# Patient Record
Sex: Male | Born: 1940
Health system: Southern US, Community
[De-identification: ages and names within clinical notes are randomized; demographics above are authoritative.]

## PROBLEM LIST (undated history)

## (undated) DIAGNOSIS — I350 Nonrheumatic aortic (valve) stenosis: Secondary | ICD-10-CM

## (undated) DIAGNOSIS — I2510 Atherosclerotic heart disease of native coronary artery without angina pectoris: Secondary | ICD-10-CM

## (undated) DIAGNOSIS — D649 Anemia, unspecified: Secondary | ICD-10-CM

## (undated) DIAGNOSIS — I440 Atrioventricular block, first degree: Secondary | ICD-10-CM

## (undated) DIAGNOSIS — I10 Essential (primary) hypertension: Secondary | ICD-10-CM

## (undated) DIAGNOSIS — N400 Enlarged prostate without lower urinary tract symptoms: Secondary | ICD-10-CM

## (undated) DIAGNOSIS — I4891 Unspecified atrial fibrillation: Secondary | ICD-10-CM

## (undated) DIAGNOSIS — C61 Malignant neoplasm of prostate: Secondary | ICD-10-CM

## (undated) DIAGNOSIS — E119 Type 2 diabetes mellitus without complications: Secondary | ICD-10-CM

## (undated) DIAGNOSIS — J45909 Unspecified asthma, uncomplicated: Secondary | ICD-10-CM

## (undated) DIAGNOSIS — E785 Hyperlipidemia, unspecified: Secondary | ICD-10-CM

## (undated) DIAGNOSIS — Z951 Presence of aortocoronary bypass graft: Secondary | ICD-10-CM

## (undated) DIAGNOSIS — I483 Typical atrial flutter: Secondary | ICD-10-CM

## (undated) DIAGNOSIS — M4800 Spinal stenosis, site unspecified: Secondary | ICD-10-CM

## (undated) DIAGNOSIS — M1990 Unspecified osteoarthritis, unspecified site: Secondary | ICD-10-CM

## (undated) DIAGNOSIS — K5790 Diverticulosis of intestine, part unspecified, without perforation or abscess without bleeding: Secondary | ICD-10-CM

## (undated) HISTORY — PX: COLONOSCOPY: SHX5424

## (undated) HISTORY — DX: Unspecified osteoarthritis, unspecified site: M19.90

## (undated) HISTORY — PX: BACK SURGERY: SHX140

## (undated) HISTORY — DX: Benign prostatic hyperplasia without lower urinary tract symptoms: N40.0

## (undated) HISTORY — DX: Typical atrial flutter: I48.3

## (undated) HISTORY — DX: Spinal stenosis, site unspecified: M48.00

## (undated) HISTORY — DX: Diverticulosis of intestine, part unspecified, without perforation or abscess without bleeding: K57.90

## (undated) HISTORY — PX: APPENDECTOMY: SHX54

## (undated) HISTORY — DX: Atrioventricular block, first degree: I44.0

## (undated) HISTORY — PX: JOINT REPLACEMENT: SHX530

## (undated) HISTORY — DX: Hyperlipidemia, unspecified: E78.5

## (undated) HISTORY — DX: Atherosclerotic heart disease of native coronary artery without angina pectoris: I25.10

## (undated) HISTORY — DX: Anemia, unspecified: D64.9

## (undated) HISTORY — DX: Unspecified asthma, uncomplicated: J45.909

---

## 1993-10-21 HISTORY — PX: CORONARY ARTERY BYPASS GRAFT: SHX141

## 1999-01-07 ENCOUNTER — Encounter (INDEPENDENT_AMBULATORY_CARE_PROVIDER_SITE_OTHER): Payer: Self-pay | Admitting: *Deleted

## 1999-01-07 ENCOUNTER — Ambulatory Visit (HOSPITAL_BASED_OUTPATIENT_CLINIC_OR_DEPARTMENT_OTHER): Admission: RE | Admit: 1999-01-07 | Discharge: 1999-01-07 | Payer: Self-pay | Admitting: *Deleted

## 1999-01-31 HISTORY — PX: SALIVARY STONE REMOVAL: SHX5213

## 2001-12-19 ENCOUNTER — Encounter: Admission: RE | Admit: 2001-12-19 | Discharge: 2001-12-19 | Payer: Self-pay | Admitting: Internal Medicine

## 2001-12-19 ENCOUNTER — Encounter: Payer: Self-pay | Admitting: Internal Medicine

## 2002-01-04 ENCOUNTER — Encounter: Admission: RE | Admit: 2002-01-04 | Discharge: 2002-01-04 | Payer: Self-pay | Admitting: Internal Medicine

## 2002-01-04 ENCOUNTER — Encounter: Payer: Self-pay | Admitting: Internal Medicine

## 2002-01-30 DIAGNOSIS — M4800 Spinal stenosis, site unspecified: Secondary | ICD-10-CM

## 2002-01-30 HISTORY — DX: Spinal stenosis, site unspecified: M48.00

## 2002-01-30 HISTORY — PX: MICRODISCECTOMY LUMBAR: SUR864

## 2002-07-31 ENCOUNTER — Encounter: Payer: Self-pay | Admitting: Diagnostic Radiology

## 2002-07-31 ENCOUNTER — Encounter: Admission: RE | Admit: 2002-07-31 | Discharge: 2002-07-31 | Payer: Self-pay | Admitting: Neurosurgery

## 2002-11-26 ENCOUNTER — Inpatient Hospital Stay (HOSPITAL_COMMUNITY): Admission: RE | Admit: 2002-11-26 | Discharge: 2002-11-27 | Payer: Self-pay | Admitting: Neurosurgery

## 2003-01-31 HISTORY — PX: FLEXIBLE SIGMOIDOSCOPY: SHX1649

## 2004-07-26 ENCOUNTER — Ambulatory Visit: Payer: Self-pay | Admitting: Internal Medicine

## 2005-02-02 ENCOUNTER — Ambulatory Visit: Payer: Self-pay | Admitting: Family Medicine

## 2005-04-18 ENCOUNTER — Ambulatory Visit: Payer: Self-pay | Admitting: Internal Medicine

## 2005-07-18 ENCOUNTER — Ambulatory Visit: Payer: Self-pay | Admitting: Internal Medicine

## 2005-08-11 ENCOUNTER — Ambulatory Visit: Payer: Self-pay | Admitting: Internal Medicine

## 2005-12-05 ENCOUNTER — Ambulatory Visit: Payer: Self-pay | Admitting: Internal Medicine

## 2005-12-05 LAB — CONVERTED CEMR LAB
Hgb A1c MFr Bld: 5.6 % (ref 4.6–6.0)
LDL Cholesterol: 51 mg/dL (ref 0–99)
VLDL: 20 mg/dL (ref 0–40)

## 2006-08-06 ENCOUNTER — Ambulatory Visit: Payer: Self-pay | Admitting: Internal Medicine

## 2006-08-06 DIAGNOSIS — E782 Mixed hyperlipidemia: Secondary | ICD-10-CM | POA: Insufficient documentation

## 2006-08-06 DIAGNOSIS — I2510 Atherosclerotic heart disease of native coronary artery without angina pectoris: Secondary | ICD-10-CM | POA: Insufficient documentation

## 2006-08-06 DIAGNOSIS — R7989 Other specified abnormal findings of blood chemistry: Secondary | ICD-10-CM | POA: Insufficient documentation

## 2006-08-12 ENCOUNTER — Encounter: Payer: Self-pay | Admitting: Internal Medicine

## 2006-08-13 ENCOUNTER — Encounter (INDEPENDENT_AMBULATORY_CARE_PROVIDER_SITE_OTHER): Payer: Self-pay | Admitting: *Deleted

## 2006-08-28 ENCOUNTER — Encounter: Payer: Self-pay | Admitting: Internal Medicine

## 2006-09-06 ENCOUNTER — Encounter: Payer: Self-pay | Admitting: Internal Medicine

## 2006-11-27 ENCOUNTER — Encounter: Payer: Self-pay | Admitting: Internal Medicine

## 2007-04-22 ENCOUNTER — Telehealth (INDEPENDENT_AMBULATORY_CARE_PROVIDER_SITE_OTHER): Payer: Self-pay | Admitting: *Deleted

## 2007-04-23 ENCOUNTER — Ambulatory Visit: Payer: Self-pay | Admitting: Internal Medicine

## 2007-04-26 ENCOUNTER — Telehealth (INDEPENDENT_AMBULATORY_CARE_PROVIDER_SITE_OTHER): Payer: Self-pay | Admitting: *Deleted

## 2007-04-29 ENCOUNTER — Telehealth (INDEPENDENT_AMBULATORY_CARE_PROVIDER_SITE_OTHER): Payer: Self-pay | Admitting: *Deleted

## 2007-05-14 ENCOUNTER — Telehealth (INDEPENDENT_AMBULATORY_CARE_PROVIDER_SITE_OTHER): Payer: Self-pay | Admitting: *Deleted

## 2007-05-14 ENCOUNTER — Encounter: Payer: Self-pay | Admitting: Internal Medicine

## 2007-11-11 ENCOUNTER — Ambulatory Visit: Payer: Self-pay | Admitting: Internal Medicine

## 2007-11-13 ENCOUNTER — Encounter: Payer: Self-pay | Admitting: Internal Medicine

## 2007-11-14 ENCOUNTER — Encounter (INDEPENDENT_AMBULATORY_CARE_PROVIDER_SITE_OTHER): Payer: Self-pay | Admitting: *Deleted

## 2007-11-14 LAB — CONVERTED CEMR LAB
Albumin: 4.5 g/dL (ref 3.5–5.2)
Alkaline Phosphatase: 57 units/L (ref 39–117)
BUN: 17 mg/dL (ref 6–23)
Basophils Relative: 0.8 % (ref 0.0–3.0)
Calcium: 10.1 mg/dL (ref 8.4–10.5)
Creatinine, Ser: 0.8 mg/dL (ref 0.4–1.5)
Eosinophils Absolute: 0.2 10*3/uL (ref 0.0–0.7)
Eosinophils Relative: 3 % (ref 0.0–5.0)
GFR calc Af Amer: 124 mL/min
GFR calc non Af Amer: 103 mL/min
Glucose, Bld: 123 mg/dL — ABNORMAL HIGH (ref 70–99)
HCT: 48.2 % (ref 39.0–52.0)
Hemoglobin: 16.8 g/dL (ref 13.0–17.0)
Hgb A1c MFr Bld: 6.1 % — ABNORMAL HIGH (ref 4.6–6.0)
MCV: 92.7 fL (ref 78.0–100.0)
Monocytes Absolute: 0.5 10*3/uL (ref 0.1–1.0)
Monocytes Relative: 8.9 % (ref 3.0–12.0)
Neutro Abs: 3.5 10*3/uL (ref 1.4–7.7)
Platelets: 294 10*3/uL (ref 150–400)
Potassium: 4.4 meq/L (ref 3.5–5.1)
TSH: 0.86 microintl units/mL (ref 0.35–5.50)
Total CHOL/HDL Ratio: 3
Total Protein: 8 g/dL (ref 6.0–8.3)
Triglycerides: 209 mg/dL (ref 0–149)
WBC: 6 10*3/uL (ref 4.5–10.5)

## 2007-12-17 ENCOUNTER — Ambulatory Visit: Admission: RE | Admit: 2007-12-17 | Discharge: 2007-12-20 | Payer: Self-pay | Admitting: Radiation Oncology

## 2008-02-06 ENCOUNTER — Ambulatory Visit: Admission: RE | Admit: 2008-02-06 | Discharge: 2008-04-28 | Payer: Self-pay | Admitting: Radiation Oncology

## 2008-04-27 ENCOUNTER — Encounter: Admission: RE | Admit: 2008-04-27 | Discharge: 2008-04-27 | Payer: Self-pay | Admitting: Specialist

## 2008-07-06 ENCOUNTER — Telehealth (INDEPENDENT_AMBULATORY_CARE_PROVIDER_SITE_OTHER): Payer: Self-pay | Admitting: *Deleted

## 2008-07-09 ENCOUNTER — Encounter: Payer: Self-pay | Admitting: Internal Medicine

## 2008-10-20 ENCOUNTER — Encounter: Admission: RE | Admit: 2008-10-20 | Discharge: 2008-10-20 | Payer: Self-pay | Admitting: Specialist

## 2008-10-30 HISTORY — PX: PROSTATE BIOPSY: SHX241

## 2008-11-13 ENCOUNTER — Encounter: Payer: Self-pay | Admitting: Internal Medicine

## 2008-11-26 ENCOUNTER — Encounter: Payer: Self-pay | Admitting: Internal Medicine

## 2008-11-30 HISTORY — PX: TOTAL HIP ARTHROPLASTY: SHX124

## 2008-12-02 ENCOUNTER — Telehealth (INDEPENDENT_AMBULATORY_CARE_PROVIDER_SITE_OTHER): Payer: Self-pay | Admitting: *Deleted

## 2008-12-14 ENCOUNTER — Inpatient Hospital Stay (HOSPITAL_COMMUNITY): Admission: RE | Admit: 2008-12-14 | Discharge: 2008-12-16 | Payer: Self-pay | Admitting: Orthopedic Surgery

## 2008-12-18 ENCOUNTER — Telehealth: Payer: Self-pay | Admitting: Internal Medicine

## 2009-03-04 ENCOUNTER — Ambulatory Visit: Payer: Self-pay | Admitting: Internal Medicine

## 2009-03-04 DIAGNOSIS — Z8546 Personal history of malignant neoplasm of prostate: Secondary | ICD-10-CM | POA: Insufficient documentation

## 2009-03-04 DIAGNOSIS — Z8739 Personal history of other diseases of the musculoskeletal system and connective tissue: Secondary | ICD-10-CM | POA: Insufficient documentation

## 2009-03-04 DIAGNOSIS — M109 Gout, unspecified: Secondary | ICD-10-CM | POA: Insufficient documentation

## 2009-03-04 DIAGNOSIS — J309 Allergic rhinitis, unspecified: Secondary | ICD-10-CM | POA: Insufficient documentation

## 2009-03-08 LAB — CONVERTED CEMR LAB
ALT: 23 units/L (ref 0–53)
Alkaline Phosphatase: 78 units/L (ref 39–117)
Bilirubin, Direct: 0 mg/dL (ref 0.0–0.3)
Cholesterol: 178 mg/dL (ref 0–200)
Creatinine, Ser: 0.8 mg/dL (ref 0.4–1.5)
Hgb A1c MFr Bld: 6.8 % — ABNORMAL HIGH (ref 4.6–6.5)
Potassium: 4.2 meq/L (ref 3.5–5.1)
Total Bilirubin: 0.5 mg/dL (ref 0.3–1.2)
Uric Acid, Serum: 5.5 mg/dL (ref 4.0–7.8)
VLDL: 38.4 mg/dL (ref 0.0–40.0)

## 2009-03-11 ENCOUNTER — Telehealth (INDEPENDENT_AMBULATORY_CARE_PROVIDER_SITE_OTHER): Payer: Self-pay | Admitting: *Deleted

## 2009-05-17 ENCOUNTER — Ambulatory Visit: Payer: Self-pay | Admitting: Internal Medicine

## 2009-05-17 DIAGNOSIS — R142 Eructation: Secondary | ICD-10-CM

## 2009-05-17 DIAGNOSIS — R143 Flatulence: Secondary | ICD-10-CM

## 2009-05-17 DIAGNOSIS — R7309 Other abnormal glucose: Secondary | ICD-10-CM

## 2009-05-17 DIAGNOSIS — R141 Gas pain: Secondary | ICD-10-CM | POA: Insufficient documentation

## 2009-05-17 DIAGNOSIS — R079 Chest pain, unspecified: Secondary | ICD-10-CM

## 2009-05-18 LAB — CONVERTED CEMR LAB
Albumin: 3.9 g/dL (ref 3.5–5.2)
Alkaline Phosphatase: 72 units/L (ref 39–117)
Eosinophils Relative: 4.6 % (ref 0.0–5.0)
HCT: 43.7 % (ref 39.0–52.0)
Hemoglobin: 15.1 g/dL (ref 13.0–17.0)
Lymphs Abs: 1.1 10*3/uL (ref 0.7–4.0)
MCV: 90.3 fL (ref 78.0–100.0)
Monocytes Relative: 11.2 % (ref 3.0–12.0)
Neutro Abs: 4.7 10*3/uL (ref 1.4–7.7)
Total Bilirubin: 0.4 mg/dL (ref 0.3–1.2)
WBC: 6.9 10*3/uL (ref 4.5–10.5)

## 2009-07-27 ENCOUNTER — Encounter: Payer: Self-pay | Admitting: Internal Medicine

## 2009-11-30 ENCOUNTER — Encounter: Payer: Self-pay | Admitting: Internal Medicine

## 2010-01-04 ENCOUNTER — Ambulatory Visit: Payer: Self-pay | Admitting: Cardiology

## 2010-01-19 ENCOUNTER — Encounter: Payer: Self-pay | Admitting: Internal Medicine

## 2010-01-19 ENCOUNTER — Ambulatory Visit: Payer: Self-pay | Admitting: Cardiology

## 2010-01-26 ENCOUNTER — Telehealth (INDEPENDENT_AMBULATORY_CARE_PROVIDER_SITE_OTHER): Payer: Self-pay | Admitting: Radiology

## 2010-01-27 ENCOUNTER — Encounter: Payer: Self-pay | Admitting: Cardiovascular Disease

## 2010-01-27 ENCOUNTER — Ambulatory Visit: Payer: Self-pay

## 2010-01-27 ENCOUNTER — Encounter: Payer: Self-pay | Admitting: Cardiology

## 2010-01-27 ENCOUNTER — Encounter (HOSPITAL_COMMUNITY)
Admission: RE | Admit: 2010-01-27 | Discharge: 2010-03-01 | Payer: Self-pay | Source: Home / Self Care | Attending: Cardiology | Admitting: Cardiology

## 2010-01-30 DIAGNOSIS — C61 Malignant neoplasm of prostate: Secondary | ICD-10-CM

## 2010-01-30 HISTORY — DX: Malignant neoplasm of prostate: C61

## 2010-02-07 ENCOUNTER — Ambulatory Visit: Payer: Self-pay | Admitting: Cardiology

## 2010-02-07 ENCOUNTER — Encounter
Admission: RE | Admit: 2010-02-07 | Discharge: 2010-02-07 | Payer: Self-pay | Source: Home / Self Care | Attending: Cardiology | Admitting: Cardiology

## 2010-02-09 ENCOUNTER — Ambulatory Visit: Payer: Self-pay | Admitting: Cardiology

## 2010-02-10 ENCOUNTER — Ambulatory Visit
Admission: RE | Admit: 2010-02-10 | Payer: Self-pay | Source: Home / Self Care | Attending: Cardiology | Admitting: Cardiology

## 2010-02-10 HISTORY — PX: CARDIAC CATHETERIZATION: SHX172

## 2010-02-27 LAB — CONVERTED CEMR LAB
Albumin: 4.4 g/dL (ref 3.5–5.2)
Alkaline Phosphatase: 78 units/L (ref 39–117)
BUN: 16 mg/dL (ref 6–23)
Basophils Absolute: 0.1 10*3/uL (ref 0.0–0.1)
CO2: 32 meq/L (ref 19–32)
Cholesterol: 168 mg/dL (ref 0–200)
GFR calc Af Amer: 109 mL/min
HCT: 49.3 % (ref 39.0–52.0)
HDL: 72.6 mg/dL (ref >39.0)
Hemoglobin: 16.5 g/dL (ref 13.0–17.0)
Hgb A1c MFr Bld: 5.7 % (ref 4.6–6.0)
Lymphocytes Relative: 27.4 % (ref 12.0–46.0)
MCHC: 33.5 g/dL (ref 30.0–36.0)
Monocytes Absolute: 0.6 10*3/uL (ref 0.2–0.7)
Monocytes Relative: 9.7 % (ref 3.0–11.0)
Neutro Abs: 3.5 10*3/uL (ref 1.4–7.7)
Neutrophils Relative %: 57.6 % (ref 43.0–77.0)
PSA: 6.12 ng/mL — ABNORMAL HIGH (ref 0.10–4.00)
Potassium: 4.2 meq/L (ref 3.5–5.1)
Sodium: 143 meq/L (ref 135–145)
Total Bilirubin: 0.8 mg/dL (ref 0.3–1.2)
Total Protein: 7.6 g/dL (ref 6.0–8.3)
Uric Acid, Serum: 5.7 mg/dL (ref 2.4–7.0)

## 2010-03-01 ENCOUNTER — Ambulatory Visit: Payer: Self-pay | Admitting: Cardiology

## 2010-03-02 HISTORY — PX: TOTAL HIP ARTHROPLASTY: SHX124

## 2010-03-03 NOTE — Assessment & Plan Note (Signed)
Summary: YEARLY EXAM AND FASTING LABS///SP[facility]   Vital Signs:  Patient profile:   70 year old male Height:      71 inches Weight:      234 pounds BMI:     32.75 Temp:     97.5 degrees F oral Pulse rate:   88 / minute Resp:     14 per minute BP sitting:   138 / 84  (left arm) Cuff size:   large  Vitals Entered By: Shonna Chock (March 04, 2009 8:24 AM) CC: Yearly follow-up and fasting labs. EKG completed by Dr.Jordan-cardiologist, seen Urologist in 01/2009 Comments REVIEWED MED LIST, PATIENT AGREED DOSE AND INSTRUCTION CORRECT    CC:  Yearly follow-up and fasting labs. EKG completed by Dr.Jordan-cardiologist and seen Urologist in 01/2009.  History of Present Illness: Bryce Maldonado is here for med refills; Dr Swaziland cleared him for R THR in 10/2008. EKG done then but lipids not checked. Both DR Jordan's OV 10/28 & Dr Nilsa Nutting D/C Summary reviewed.He is stable from hip status , able to ambulate. He uses cane to mobilize from chair.  Preventive Screening-Counseling & Management  Alcohol-Tobacco     Smoking Status: quit  Caffeine-Diet-Exercise     Does Patient Exercise: yes  Allergies (verified): No Known Drug Allergies  Past History:  Past Medical History: salivary gland stone LS Spinal Stenosis FASTING HYPERGLYCEMIA (ICD-790.6) HYPERURICEMIA (ICD-790.6) HYPERLIPIDEMIA NEC/NOS (ICD-272.4) C A D (ICD-414.00) Prostate cancer, hx of, 2010, S/P Radiation, [name] Gout, PMH of(last flare 1991)  Past Surgical History: bone spur R foot; salivary stone surgery as OP; LS surgery 2004 for nerve entrapment; Appendectomy Coronary artery bypass graft 1995, 4 vessel, Dr Swaziland, Cardiologist Colonoscopy 2003: neg;2005: hemorrhoids Prostate biopsy 10/2008  Family History: Father:DM,HTN, lung cancer  Mother: CHF @ 65 Siblings: sister lipids; 1/2 sister CAD  Social History: Retired Runner, broadcasting/film/video Married Alcohol use-yes: socailly Regular exercise-yes: walking 35 min once daily & gym  machines 3 X/ week Former Smoker: quit 1977 No diet  Review of Systems General:  Denies chills, fatigue, fever, sweats, and weight loss. Eyes:  Denies blurring, double vision, and vision loss-both eyes. ENT:  Denies difficulty swallowing and hoarseness. CV:  Denies chest pain or discomfort, leg cramps with exertion, palpitations, shortness of breath with exertion, swelling of feet, and swelling of hands. Resp:  Denies cough, shortness of breath, and sputum productive; PMH of asthma as child, last age 59. GI:  Denies abdominal pain, bloody stools, dark tarry stools, and indigestion. GU:  [name] seen 01/2009; PSA 0.9. MS:  Complains of joint pain and stiffness; denies joint redness, joint swelling, low back pain, mid back pain, and thoracic pain. Derm:  Denies lesion(s) and rash. Neuro:  Denies numbness, tingling, and weakness. Psych:  Denies anxiety and depression. Endo:  Complains of heat intolerance; denies cold intolerance, excessive hunger, excessive thirst, and excessive urination. Heme:  Denies abnormal bruising and bleeding. Allergy:  Complains of itching eyes, seasonal allergies, and sneezing.  Physical Exam  General:  well-nourished,in no acute distress; alert,appropriate and cooperative throughout examination Neck:  No deformities, masses, or tenderness noted. Lungs:  Normal respiratory effort, chest expands symmetrically. Lungs are clear to auscultation, no crackles or wheezes. Heart:  Normal rate and regular rhythm. S1 and S2 normal without gallop, murmur, click, rub . S4 with slurring Abdomen:  Bowel sounds positive,abdomen soft and non-tender without masses, organomegaly or hernias noted. Prostate:  [name] Msk:  No deformity or scoliosis noted of thoracic or lumbar spine.  Pulses:  R and L carotid,radial,dorsalis pedis and posterior tibial pulses are full and equal bilaterally Extremities:  No clubbing, cyanosis, edema, or deformity noted with essentially  normal  full range of motion of all joints.   Neurologic:  alert & oriented X3, strength normal in all extremities, and DTRs symmetrical and normal.   Skin:  Intact without suspicious lesions or rashes Cervical Nodes:  No lymphadenopathy noted Axillary Nodes:  No palpable lymphadenopathy Psych:  memory intact for recent and remote, normally interactive, and good eye contact.     Impression & Recommendations:  Problem # 1:  HYPERLIPIDEMIA NEC/NOS (ICD-272.4)  His updated medication list for this problem includes:    Crestor 10 Mg Tabs (Rosuvastatin calcium) .[name]... 1 by mouth qd    Zetia 10 Mg Tabs (Ezetimibe) .[name]... 1 by mouth qd  Orders: Venipuncture (62130) TLB-Lipid Panel (80061-LIPID) TLB-Hepatic/Liver Function Pnl (80076-HEPATIC) TLB-TSH (Thyroid Stimulating Hormone) (84443-TSH) TLB-Creatinine, Blood (82565-CREA) TLB-Potassium (K+) (84132-K) TLB-BUN (Urea Nitrogen) (84520-BUN)  Problem # 2:  C A D (ICD-414.00)  as per Dr Swaziland His updated medication list for this problem includes:    Aspirin Adult Low Strength 81 Mg Tbec (Aspirin) .[name]... 1 by mouth two times a day .  Orders: Venipuncture (86578)  Problem # 3:  GOUT (ICD-274.9)  PMH of His updated medication list for this problem includes:    Allopurinol 300 Mg Tabs (Allopurinol) .[name]... 1 by mouth qd  Orders: Venipuncture (46962) TLB-Uric Acid, Blood (84550-URIC) Prescription Created Electronically [phone])  Problem # 4:  PROSTATE CANCER, HX OF (ICD-V10.46) as per [name]  Problem # 5:  FASTING HYPERGLYCEMIA (ICD-790.6)  Orders: Venipuncture (13244) TLB-A1C / Hgb A1C (Glycohemoglobin) (83036-A1C)  Problem # 6:  ATOPIC RHINITIS (ICD-477.9)  His updated medication list for this problem includes:    Fexofenadine Hcl 180 Mg Tabs (Fexofenadine hcl) .[name]... 1 by mouth once daily prn  Orders: Prescription Created Electronically [phone])  Complete Medication List: 1)  Allopurinol 300 Mg Tabs (Allopurinol) .[name].. 1 by mouth  qd 2)  Crestor 10 Mg Tabs (Rosuvastatin calcium) .[name].. 1 by mouth qd 3)  Zetia 10 Mg Tabs (Ezetimibe) .[name].. 1 by mouth qd 4)  Melatonin  5)  Folic Acid 1mg   6)  Centrum Silver  7)  Quercertin  8)  Fexofenadine Hcl 180 Mg Tabs (Fexofenadine hcl) .[name].. 1 by mouth once daily prn 9)  Aspirin Adult Low Strength 81 Mg Tbec (Aspirin) .[name].. 1 by mouth two times a day .  Patient Instructions: 1)  Share this record & labs with all MDs seen. Prescriptions: FEXOFENADINE HCL 180 MG  TABS (FEXOFENADINE HCL) 1 by mouth once daily prn  #90 Tablet x 3   Entered and Authorized by:   Marga Melnick MD   Signed by:   Marga Melnick MD on 03/04/2009   Method used:   Faxed to ...       [facility] [address] (retail)       [address] [place], [place]  [address]       Ph: [phone] or [phone]       Fax: [phone]   RxID:   [id number] ALLOPURINOL 300 MG  TABS (ALLOPURINOL) 1 by mouth qd  #90 x 3   Entered and Authorized by:   Marga Melnick MD   Signed by:   Marga Melnick MD on 03/04/2009   Method used:   Faxed to .Marland KitchenMarland Kitchen  [facility] [address] (retail)       [address] [place], [place]  [address]       Ph: [phone] or [phone]       Fax: [phone]   RxID:   [id number]

## 2010-03-03 NOTE — Progress Notes (Signed)
Summary: nuc pre-procedure  Phone Note Outgoing Call   Call placed by: Domenic Polite, CNMT,  January 26, 2010 1:12 PM Call placed to: Patient Reason for Call: Confirm/change Appt Summary of Call: Left message with information on My[employer] Information Sheet (see scanned document for details).      Nuclear Med Background Indications for Stress Test: Evaluation for Ischemia   History: CABG, Myocardial Perfusion Study  History Comments: '95 CABG x 4; '08 MPI- Inf. scar/ EF=67%  Symptoms: Chest Pain, Chest Pain with Exertion    Nuclear Pre-Procedure Cardiac Risk Factors: Lipids Height (in): 71

## 2010-03-03 NOTE — Progress Notes (Signed)
Summary: Lab results  Phone Note Outgoing Call Call back at [facility] Phone [phone]   Call placed by: Shonna Chock,  March 11, 2009 4:59 PM Call placed to: Patient Summary of Call: Message left on VM: Patient would like a call to discuss labs   Left message on machine informing patient labs were mailed so he will receive them soon. I also left the information below and instructed patient to call to schedule appointment if he prefers to discuss in person. Poorly controlled Diabetes is present. Please read ALL food & drink labels; consume LESS THAN 40 grams of sugar /day from those with High Fructose Corn Syrup as #1,2 or #3 on label. Start Metformin & Glimiperide  & repeat A1c in 8 weeks. OV 2-3 days later with all meds. Hopp  Chrae Malloy  March 11, 2009 5:03 PM

## 2010-03-03 NOTE — Medication Information (Signed)
Summary: Fexofenadine/[facility]  Fexofenadine/[facility]   Imported By: Lanelle Bal 08/19/2009 11:30:06  _____________________________________________________________________  External Attachment:    Type:   Image     Comment:   External Document

## 2010-03-03 NOTE — Letter (Signed)
Summary: [facility]   Imported By: Lanelle Bal 02/11/2010 13:03:28  _____________________________________________________________________  External Attachment:    Type:   Image     Comment:   External Document

## 2010-03-03 NOTE — Assessment & Plan Note (Signed)
Summary: gas build up/lch   Vital Signs:  Patient profile:   70 year old male Weight:      228.4 pounds Temp:     98.3 degrees F oral Pulse rate:   92 / minute Resp:     16 per minute BP sitting:   122 / 80  (left arm) Cuff size:   large  Vitals Entered By: Shonna Chock (May 17, 2009 10:25 AM) CC: Gas Comments REVIEWED MED LIST, PATIENT AGREED DOSE AND INSTRUCTION CORRECT    CC:  Gas.  History of Present Illness: Gas & bloating since 05/09/2009 after week @ beach with intermittent  R lateral chest pain. Initially gas onset 04/10 with minor epigastric discomfort which moved to R lateral chest as of 04/12. The patient reports resting chest pain and intermittent  indigestion, but denies exertional chest pain, nausea, vomiting, diaphoresis, shortness of breath, palpitations, dizziness, light headedness, and syncope.  The pain is described as intermittent and dull.  The pain is located in the R lateral chest;  the pain does not radiate.  Episodes of chest pain last < 1 minute.  The pain is brought on or made worse by deep breathing and upper body movement, ie changing position in bed ,esp RLDP.  PMH of colitis & appendectomy .He is due for A1c F/U  Allergies (verified): No Known Drug Allergies  Review of Systems General:  Denies chills, fever, sweats, and weight loss. GI:  Denies bloody stools, constipation, dark tarry stools, and diarrhea. GU:  Denies discharge, dysuria, and hematuria. Derm:  Denies lesion(s) and rash. Heme:  Denies abnormal bruising and bleeding.  Physical Exam  General:  well-nourished,in no acute distress; alert,appropriate and cooperative throughout examination Eyes:  No corneal or conjunctival inflammation noted. Perrla.No icterus Mouth:  Oral mucosa and oropharynx without lesions or exudates.  Teeth in good repair.No pharyngeal erythema.   Lungs:  Normal respiratory effort, chest expands symmetrically. Lungs are clear to auscultation, no crackles or wheezes.  No splinting or rub Heart:  Normal rate and regular rhythm. S1 and S2 normal without gallop, murmur, click, rub .S4 Skin:  Intact without suspicious lesions or rashes. No jaundice Cervical Nodes:  No lymphadenopathy noted Axillary Nodes:  No palpable lymphadenopathy   Impression & Recommendations:  Problem # 1:  GAS/BLOATING (ICD-787.3)  Orders: Venipuncture (57846) TLB-Hepatic/Liver Function Pnl (80076-HEPATIC) TLB-CBC Platelet - w/Differential (85025-CBCD) TLB-Amylase (82150-AMYL) TLB-Lipase (83690-LIPASE) Radiology Referral (Radiology)  Problem # 2:  CHEST PAIN (ICD-786.50)  pleuritic component  Orders: Venipuncture (96295) Radiology Referral (Radiology)  Problem # 3:  HYPERGLYCEMIA, FASTING (ICD-790.29) Due for A1c His updated medication list for this problem includes:    Metformin Hcl 500 Mg Xr24h-tab (Metformin hcl) .[name]... 1 two times a day with 2 largest meals    Glimepiride 1 Mg Tabs (Glimepiride) .[name]... 1 two times a day with 2 largest meals  Orders: TLB-A1C / Hgb A1C (Glycohemoglobin) (83036-A1C)  Complete Medication List: 1)  Allopurinol 300 Mg Tabs (Allopurinol) .[name].. 1 by mouth qd 2)  Crestor 10 Mg Tabs (Rosuvastatin calcium) .[name].. 1 by mouth qd 3)  Zetia 10 Mg Tabs (Ezetimibe) .[name].. 1 by mouth qd 4)  Melatonin  5)  Folic Acid 1mg   6)  Centrum Silver  7)  Quercertin  8)  Fexofenadine Hcl 180 Mg Tabs (Fexofenadine hcl) .[name].. 1 by mouth once daily prn 9)  Aspirin Adult Low Strength 81 Mg Tbec (Aspirin) .[name].. 1 by mouth two times a day . 10)  Metformin Hcl 500 Mg Xr24h-tab (  Metformin hcl) .[name].. 1 two times a day with 2 largest meals 11)  Glimepiride 1 Mg Tabs (Glimepiride) .[name].. 1 two times a day with 2 largest meals 12)  Ranitidine Hcl 150 Mg Tabs (Ranitidine hcl) .[name].. 1 two times a day pre meals  Patient Instructions: 1)  Complete stool cards. 2)  Avoid foods high in acid (tomatoes, citrus juices, spicy foods). Avoid eating within two hours of lying down or  before exercising. Do not over eat; try smaller more frequent meals. Elevate head of bed twelve inches when sleeping. Prescriptions: RANITIDINE HCL 150 MG TABS (RANITIDINE HCL) 1 two times a day pre meals  #60 x 1   Entered and Authorized by:   Marga Melnick MD   Signed by:   Marga Melnick MD on 05/17/2009   Method used:   Faxed to ...       [facility] [address] (retail)       [address] [place], [place]  [address]       Ph: [phone] or [phone]       Fax: [phone]   RxID:   [id number]   Appended Document: Orders Update    Clinical Lists Changes  Orders: Added new Test order of T-Acute Abdomen (2 view w/ PA & Chest (93235TD) - Signed

## 2010-03-03 NOTE — Miscellaneous (Signed)
Summary: [facility]  [facility]   Imported By: Lanelle Bal 12/20/2009 10:49:27  _____________________________________________________________________  External Attachment:    Type:   Image     Comment:   External Document

## 2010-03-03 NOTE — Assessment & Plan Note (Signed)
Summary: Cardiology Nuclear Testing  Nuclear Med Background Indications for Stress Test: Evaluation for Ischemia, Surgical Clearance, Graft Patency  Indications Comments: Pending (L) THR on 03/15/10 by Dr. Durene Romans  History: CABG, Heart Catheterization, Myocardial Perfusion Study  History Comments: '95 CABG; '08 EAV:[id number] scar, EF=67%  Symptoms: Chest Pain, Chest Pain with Exertion, Chest Pressure, Chest Pressure with Exertion  Symptoms Comments: CP>(L) shoulder. Last episode of CP:now, 2/10.   Nuclear Pre-Procedure Cardiac Risk Factors: History of Smoking, Lipids, Obesity Caffeine/Decaff Intake: None NPO After: 7:30 AM Lungs: Clear.  O2 Sat 97% on RA. IV 0.9% NS with Angio Cath: 20g     IV Site: R Antecubital IV Started by: Stanton Kidney, EMT-P Chest Size (in) 46     Height (in): 71 Weight (lb): 240 BMI: 33.59  Nuclear Med Study 1 or 2 day study:  1 day     Stress Test Type:  Treadmill/Lexiscan Reading MD:  Charlton Haws, MD     Referring MD:  Peter Swaziland, MD Resting Radionuclide:  Technetium 27m Tetrofosmin     Resting Radionuclide Dose:  11 mCi  Stress Radionuclide:  Technetium 44m Tetrofosmin     Stress Radionuclide Dose:  33 mCi   Stress Protocol Exercise Time (min):  2:00 min     Max HR:  139 bpm     Predicted Max HR:  152 bpm  Max Systolic BP: 161 mm Hg     Percent Max HR:  91.45 %Rate Pressure Product:  47829  Lexiscan: 0.4 mg   Stress Test Technologist:  Rea [employer], CMA-N     Nuclear Technologist:  Domenic Polite, CNMT  Rest Procedure  Myocardial perfusion imaging was performed at rest 45 minutes following the intravenous administration of Technetium 87m Tetrofosmin.  Stress Procedure  The patient received IV Lexiscan 0.4 mg over 15-seconds with concurrent low level exercise and then Technetium 63m Tetrofosmin was injected at 30-seconds.  There were no significant changes with infusion, other than frequent PAC's and rare PVC.  Quantitative spect images  were obtained after a 45 minute delay.  QPS Raw Data Images:  Normal; no motion artifact; normal heart/lung ratio. Stress Images:  Decrease inferior counts Rest Images:  Normal homogeneous uptake in all areas of the myocardium. Subtraction (SDS):  SDS 2 Transient Ischemic Dilatation:  0.98  (Normal <1.22)  Lung/Heart Ratio:  0.31  (Normal <0.45)  Quantitative Gated Spect Images QGS EDV:  74 ml QGS ESV:  25 ml QGS EF:  66 % QGS cine images:  normal  Findings Low risk nuclear study      Overall Impression  Exercise Capacity: Lexiscan with no exercise. BP Response: Normal blood pressure response. Clinical Symptoms: headache ECG Impression: LVH with repolarization abnormality Overall Impression: Moderate inferior wall ischemia at mid and basal level.    Appended Document: Cardiology Nuclear Testing COPY SENT TO DR. Swaziland

## 2010-03-04 ENCOUNTER — Encounter (HOSPITAL_COMMUNITY): Payer: Medicare Other | Attending: Orthopedic Surgery

## 2010-03-04 DIAGNOSIS — Z01812 Encounter for preprocedural laboratory examination: Secondary | ICD-10-CM | POA: Insufficient documentation

## 2010-03-04 LAB — CBC
HCT: 45.1 % (ref 39.0–52.0)
Hemoglobin: 15.5 g/dL (ref 13.0–17.0)
MCH: 31.5 pg (ref 26.0–34.0)
MCV: 91.7 fL (ref 78.0–100.0)
RBC: 4.92 MIL/uL (ref 4.22–5.81)

## 2010-03-04 LAB — DIFFERENTIAL
Lymphocytes Relative: 16 % (ref 12–46)
Lymphs Abs: 1.2 10*3/uL (ref 0.7–4.0)
Monocytes Relative: 10 % (ref 3–12)
Neutro Abs: 5.1 10*3/uL (ref 1.7–7.7)
Neutrophils Relative %: 71 % (ref 43–77)

## 2010-03-04 LAB — URINALYSIS, ROUTINE W REFLEX MICROSCOPIC
Ketones, ur: NEGATIVE mg/dL
Urine Glucose, Fasting: 250 mg/dL — AB
pH: 5 (ref 5.0–8.0)

## 2010-03-04 LAB — SURGICAL PCR SCREEN
MRSA, PCR: NEGATIVE
Staphylococcus aureus: POSITIVE — AB

## 2010-03-04 LAB — BASIC METABOLIC PANEL
Calcium: 9.6 mg/dL (ref 8.4–10.5)
Chloride: 105 mEq/L (ref 96–112)
Creatinine, Ser: 0.97 mg/dL (ref 0.4–1.5)
GFR calc Af Amer: >60 mL/min (ref >60)

## 2010-03-04 LAB — PROTIME-INR: INR: 0.97 (ref 0.00–1.49)

## 2010-03-13 NOTE — H&P (Signed)
NAME:  Bryce Maldonado, Bryce Maldonado NO.:  [id number]  MEDICAL RECORD NO.:  [id number]          PATIENT TYPE:  INP  LOCATION:  NA                           FACILITY:  [facility]  PHYSICIAN:  Madlyn Frankel. Charlann Boxer, M.D.  DATE OF BIRTH:  1940-02-22  DATE OF ADMISSION: DATE OF DISCHARGE:                             HISTORY & PHYSICAL   ADMITTING DIAGNOSIS:  Left hip osteoarthritis.  BRIEF HISTORY:  The patient was treated conservatively here for sometime and after failed conservative treatment with a history of right hip replacement, Dr. Charlann Boxer decided to proceed with the left anterior approach hip arthroplasty.  PAST MEDICAL HISTORY:  Significant for some tinnitus from time to time. He does have a history of heart disease with coronary artery bypass surgery.  He has a history of hemorrhoids, gout and prostate cancer as well as back pain and osteoarthritis in the joints.  PAST SURGICAL HISTORY:  Includes: 1. Appendectomy. 2. Bone spurs on the right foot. 3. Bypass surgery in 1995. 4. Back surgery in 2004. 5. Right hip replacement in 2010.  CURRENT MEDICATIONS: 1. Crestor 10 mg a day. 2. Zetia 10 mg a day at bedtime. 3. Ecotrin 81 mg at night. 4. Allopurinol 300 mg a day. 5. Melatonin 3 mg at night. 6. Folic acid daily. 7. Calcium every morning.  ALLERGIES:  He has no medicine allergies.  SOCIAL HISTORY:  The patient is married.  He is a retired history Runner, broadcasting/film/video.  Has a past history of tobacco use.  He uses alcohol very socially and has no history of substance abuse.  He has 2 children.  His disposition plan is for home.  FAMILY HISTORY:  His father died of lung cancer, his mother of heart failure.  He does have siblings.  REVIEW OF SYSTEMS:  Notable for those difficulties described in history of present illness, past medical history.  Review of systems sheet is otherwise unremarkable.  PHYSICAL EXAMINATION:  VITAL SIGNS:  The patient is 5 feet 11 inches, 225 pounds, blood  pressure is 140/90, his respirations are 20, his pulse is 80. GENERAL:  His general health is good. HEENT:  Shows him to be normocephalic.  He does wear glasses.  He has tinnitus.  No dentures. NECK:  Unremarkable. Chest: Clear to auscultation bilaterally. HEART:  Has S1, S2.  He does have a history of CABG in 1995. ABDOMEN:  Soft and nondistended. GI/GU:  Otherwise unremarkable with his brief history of hemorrhoids. He does have history of prostate cancer in 2010 with no recurrence. EXTREMITIES:  Shows history of osteoarthritis as well as gout. DERMATOLOGICAL:  He is intact. NEUROLOGICAL:  He is intact.  His labs, EKG and chest x-ray are pending through [facility].  IMPRESSION:  Left hip osteoarthritis.  PLAN:  He will be admitted on February 14 for left hip arthroplasty with anterior approach.  His discharge medications including Xarelto, Robaxin, MiraLax, Colace and iron were given to him today.  His pain medicines will be given to him at discharge.     Russell L. Webb Silversmith, RN   ______________________________ Madlyn Frankel Charlann Boxer, M.D.    RLW/MEDQ  D:  03/03/2010  T:  03/03/2010  Job:  [id number]  Electronically Signed by Lauree Chandler NP-C on 03/09/2010 09:44:08 AM Electronically Signed by Durene Romans M.D. on 03/13/2010 09:18:23 AM

## 2010-03-15 ENCOUNTER — Inpatient Hospital Stay (HOSPITAL_COMMUNITY): Payer: Medicare Other

## 2010-03-15 ENCOUNTER — Inpatient Hospital Stay (HOSPITAL_COMMUNITY)
Admission: RE | Admit: 2010-03-15 | Discharge: 2010-03-17 | DRG: 470 | Disposition: A | Discharge status: Home Health | Payer: Medicare Other | Attending: Orthopedic Surgery | Admitting: Orthopedic Surgery

## 2010-03-15 DIAGNOSIS — Z96649 Presence of unspecified artificial hip joint: Secondary | ICD-10-CM

## 2010-03-15 DIAGNOSIS — Z951 Presence of aortocoronary bypass graft: Secondary | ICD-10-CM

## 2010-03-15 DIAGNOSIS — I2510 Atherosclerotic heart disease of native coronary artery without angina pectoris: Secondary | ICD-10-CM | POA: Diagnosis present

## 2010-03-15 DIAGNOSIS — M1990 Unspecified osteoarthritis, unspecified site: Secondary | ICD-10-CM

## 2010-03-15 DIAGNOSIS — Z8546 Personal history of malignant neoplasm of prostate: Secondary | ICD-10-CM

## 2010-03-15 DIAGNOSIS — M1610 Unilateral primary osteoarthritis, unspecified hip: Principal | ICD-10-CM | POA: Diagnosis present

## 2010-03-15 DIAGNOSIS — M169 Osteoarthritis of hip, unspecified: Principal | ICD-10-CM | POA: Diagnosis present

## 2010-03-15 LAB — TYPE AND SCREEN

## 2010-03-16 LAB — CBC
HCT: 35.5 % — ABNORMAL LOW (ref 39.0–52.0)
MCH: 31.2 pg (ref 26.0–34.0)
MCHC: 34.4 g/dL (ref 30.0–36.0)
MCV: 90.8 fL (ref 78.0–100.0)
RBC: 3.91 MIL/uL — ABNORMAL LOW (ref 4.22–5.81)
RDW: 12.6 % (ref 11.5–15.5)

## 2010-03-16 LAB — BASIC METABOLIC PANEL
BUN: 11 mg/dL (ref 6–23)
Calcium: 8.9 mg/dL (ref 8.4–10.5)
Creatinine, Ser: 0.79 mg/dL (ref 0.4–1.5)
GFR calc non Af Amer: >60 mL/min (ref >60)
Glucose, Bld: 238 mg/dL — ABNORMAL HIGH (ref 70–99)

## 2010-03-17 LAB — BASIC METABOLIC PANEL
Calcium: 8.9 mg/dL (ref 8.4–10.5)
GFR calc Af Amer: >60 mL/min (ref >60)
GFR calc non Af Amer: >60 mL/min (ref >60)
Sodium: 141 mEq/L (ref 135–145)

## 2010-03-17 LAB — CBC
MCHC: 34.3 g/dL (ref 30.0–36.0)
RDW: 13 % (ref 11.5–15.5)

## 2010-03-21 NOTE — Op Note (Signed)
NAME:  Bryce Maldonado, Bryce Maldonado NO.:  [id number]  MEDICAL RECORD NO.:  [id number]           PATIENT TYPE:  I  LOCATION:  1604                         FACILITY:  [facility]  PHYSICIAN:  Madlyn Frankel. Charlann Boxer, M.D.  DATE OF BIRTH:  September 24, 1940  DATE OF PROCEDURE:  03/15/2010 DATE OF DISCHARGE:                              OPERATIVE REPORT   PREOPERATIVE DIAGNOSIS:  Left hip osteoarthritis.  POSTOPERATIVE DIAGNOSIS:  Left hip osteoarthritis.  PROCEDURE:  Left total-hip replacement through an anterior approach utilizing DePuy component size 54 pinnacle cup, single cancellous screw, a 36+4 Altrex neutral liner, a size 7 high Trilock stem with a 36 +8.5 Delta ceramic ball.  SURGEON:  Madlyn Frankel. Charlann Boxer, M.D.  ASSISTANT:  Nelia Shi. Webb Silversmith, RN  ANESTHESIA:  General.  ESTIMATED BLOOD LOSS:  500 cc.  DRAINS:  One Hemovac.  SPECIMENS:  None.  COMPLICATIONS:  None.  INDICATIONS OF THE PROCEDURE:  Mr. Hehn is a 70 year old gentlemanpatient of mine with a history of a right total-hip replacement.  He had progressive degenerative changes of the left hip and wished to proceed with left hip arthroplasty based on the success of his right hip in regard to pain relief.  We reviewed the risks of infection, DVT, component failure, need for revision surgery as well as the risks and benefits of an anterior versus posterior approach.  Based off of this discussion today he wished to proceed with an anterior approach of a left hip replacement.  Consent was obtained for the benefit of pain relief.  PROCEDURE IN DETAIL:  The patient was brought to the operative theater. Once adequate anesthesia, preoperative antibiotics, Ancef administered, the patient was positioned supine on the [employer] table.  Bony prominences were padded.  Fluoroscopy was brought to the field to identify landmarks and evaluate the pelvis for perioperative use.  The left lower extremity was then prepped and draped in the  sterile fashion with shower curtain technique for exposure to the anterolateral aspect of the proximal thigh.  A time-out was performed identifying the patient, planned procedure and extremity.  An incision was then made 2 cm lateral to the anterior superior iliac spine, extending over the anterior aspect of the trochanters.  Soft tissue planes created and a protractor placed.  The fascia of the tensor fascia lata muscle was then incised, elevated and the tensor fascia muscles swept laterally. Retractors were placed over the superior neck, pericapsular fat tissue was debrided and the anterior circumflex vessels cauterized and a second retractor was then placed over the inferior neck.  A capsulotomy was made based off the superior neck and extending onto the labrum and then down to the trochanteric fossa and then down the trochanteric line.  Tag sutures were placed and retractors were now placed intracapsular.  At this point, traction was applied to the hip.  Fluoroscopy was brought into the field to help identify the landmarks for my neck cut. Following this, the neck osteotomy was made.  The femoral head was removed.  The traction was removed.  Retractors were placed anterior and posterior, labrum debridement as well as osteophytes.  I then began  reaming with a 48-reamer into the central foveal fossa region and then confirmed the position of the cup, reaming based on fluoroscopic imaging.  I tried to match the position of the cup as best I could to the contralateral hip.  I reamed up to 53 reamer with a good bony bed preparation and chose a 54 pinnacle cup as we had on the other hip. This cup was then impacted under fluoroscopic imaging and a single cancellous screw used to support the fixation.  A final 36 +4 neutral Altrex liner was then impacted into the shell.  At this point, attention was directed to the femur.  The femur was first externally rotated to allow for an inferior  capsular release.  I then externally rotated to 110-120 degrees with a retractor placed medially. Exposure was taken out post care posteriorly, elevating the superior capsular leaflet and further releasing it proximally.  A portion of the posterior tissues were released off the posterior aspect of the femur and a second retractor placed over the greater trochanter.  At this point, I used a box osteotome and then began broaching.  I confirmed the location of the initial broach by radiographic evaluation both in the AP and lateral planes.  At this point, I broached up to a size 6 broach initially and this sat at the level of my neck cut.  I did a trial reduction and with this identified with a high offset neck that I was a little bit shorter on this left side, this operative side, then I was on the right.  At this point, I broached, retractors were placed, and the size 7 broach went in and sat a little bit proud of my neck cut, so we chose this as my final stem.  The final 7 high Trilock stem was opened and impacted and sat approximately 2-3 mm above the neck cut, which is what I was hoping for.  I then retrialed with a +5 ball, as we had done before and what was on the contralateral hip and ended up choosing an 8.5 ball to help with length to try to match the other side. The stability of the hip otherwise was excellent without evidence of any subluxation or impingement.  The final 36 +8.5 Delta ceramic ball was then impacted onto the clean and dry trunnion and the hip reduced.  The hip had been irrigated throughout the case and again at this point. I reapproximated the anterior capsular tissues with a #1 Vicryl suture. I placed a medium Hemovac drain deep.  The fascia of the tensor fascia lata muscle was then reapproximated using #1 Vicryl.  The remainder of the wound was closed with 2-0 Vicryl and running 4-0 Monocryl.  The hip was then cleaned, dried and dressed sterilely with the  Octylseal sealant followed by the Aquacel dressing.  The drain site was dressed separately.  He was then extubated and brought to the recovery room in stable condition, tolerating the procedure well.     Madlyn Frankel Charlann Boxer, M.D.    MDO/MEDQ  D:  03/15/2010  T:  03/16/2010  Job:  [id number]  Electronically Signed by Durene Romans M.D. on 03/21/2010 10:49:50 AM

## 2010-03-25 NOTE — Discharge Summary (Signed)
  NAME:  Bryce Maldonado, Bryce Maldonado NO.:  [id number]  MEDICAL RECORD NO.:  [id number]           PATIENT TYPE:  I  LOCATION:  1604                         FACILITY:  [facility]  PHYSICIAN:  Madlyn Frankel. Charlann Boxer, M.D.  DATE OF BIRTH:  27-Jan-1941  DATE OF ADMISSION:  03/15/2010 DATE OF DISCHARGE:  03/17/2010                              DISCHARGE SUMMARY   ADMITTING DIAGNOSIS:  Left hip osteoarthritis.  BRIEF HISTORY:  This patient was treated conservatively for some time and failed conservative treatment and decided to proceed with arthroplasty through left anterior approach.  HOSPITAL COURSE:  The patient was admitted through Same-Day Surgery on 14th, was taken to the operating theatre, underwent total hip arthroplasty, anterior approach, and did well that.  He was taken to the PACU for recovery and brought to [facility] for further recovery and rehabilitation.  Since that time, he has advanced his diet regularly. He has been having physical therapy.  His labs have been stable.  His H and H today were 12.2 and hematocrit 35.5.  He is afebrile.  His vital signs were stable.  His wound was checked, it was  clean and dry.  He has Aquacel dressing placed and he is to remove that after 8 days.  The patient will be home today with home health physical therapy and follow up with Dr. Charlann Boxer in 2 weeks.  DISCHARGE CONDITION:  Good.  DISCHARGE DIAGNOSES: 1. Left hip osteoarthritis. 2. Tinnitus. 3. Heart disease with coronary artery bypass graft. 4. Hemorrhoids. 5. Gout. 6. Prostate cancer. 7. Back pain. 8. Osteoarthritis.  DISCHARGE MEDICATIONS: 1. Acetaminophen 325 mg every 4 hours as needed for pain. 2. Colace 100 mg twice daily. 3. Ferrous sulfate 325 mg 3 times a day for 3 weeks. 4. Hydrocodone 5/325 mg 1-2 q.4-6 h. p.r.n. 5. Robaxin 500 mg every 6 hours as needed. 6. Xarelto 10 mg a day for 10 days, leave after that. 7. Allegra 180 mg as needed. 8. Allopurinol 300 mg daily. 9.  Aspirin 81 mg, after the Xarelto is finished. 10.Calcium with vitamin D daily. 11.Crestor 10 mg daily. 12.Folic acid daily. 13.Melatonin 3 mg as needed at night. 14.Multivitamins daily. 15.Zetia 10 mg daily.     Russell L. Webb Silversmith, RN   ______________________________ Madlyn Frankel Charlann Boxer, M.D.    RLW/MEDQ  D:  03/17/2010  T:  03/18/2010  Job:  [id number]  Electronically Signed by Lauree Chandler NP-C on 03/24/2010 01:21:49 PM Electronically Signed by Durene Romans M.D. on 03/25/2010 07:03:06 AM

## 2010-04-13 ENCOUNTER — Telehealth (INDEPENDENT_AMBULATORY_CARE_PROVIDER_SITE_OTHER): Payer: Self-pay | Admitting: *Deleted

## 2010-04-19 NOTE — Progress Notes (Signed)
Summary: Allopurinol refill  Phone Note Refill Request Message from:  Fax from Pharmacy on April 13, 2010 10:36 AM  Refills Requested: Medication #1:  ALLOPURINOL 300 MG  TABS 1 by mouth once daily **[facility] [employer], phone   [phone],    fax  = [phone]  Next Appointment Scheduled: Caleen Essex 5/25   [place] Initial call taken by: Jerolyn Shin,  April 13, 2010 10:37 AM    Prescriptions: ALLOPURINOL 300 MG  TABS (ALLOPURINOL) 1 by mouth once daily **APPOINTMENT DUE**  #90 x 0   Entered by:   Shonna Chock CMA   Authorized by:   Marga Melnick MD   Signed by:   Shonna Chock CMA on 04/13/2010   Method used:   Faxed to ...       [facility] (mail-order)             , [place]         Ph: [phone]       Fax: [phone]   RxID:   [id number]

## 2010-05-04 LAB — CBC
HCT: 32 % — ABNORMAL LOW (ref 39.0–52.0)
HCT: 34.5 % — ABNORMAL LOW (ref 39.0–52.0)
HCT: 43.4 % (ref 39.0–52.0)
Hemoglobin: 10.9 g/dL — ABNORMAL LOW (ref 13.0–17.0)
Hemoglobin: 11.8 g/dL — ABNORMAL LOW (ref 13.0–17.0)
Hemoglobin: 15.1 g/dL (ref 13.0–17.0)
MCV: 94.9 fL (ref 78.0–100.0)
MCV: 93.9 fL (ref 78.0–100.0)
Platelets: 188 10*3/uL (ref 150–400)
Platelets: 227 10*3/uL (ref 150–400)
Platelets: 277 10*3/uL (ref 150–400)
RBC: 3.37 MIL/uL — ABNORMAL LOW (ref 4.22–5.81)
WBC: 6.8 10*3/uL (ref 4.0–10.5)
WBC: 8.1 10*3/uL (ref 4.0–10.5)
WBC: 6.5 10*3/uL (ref 4.0–10.5)

## 2010-05-04 LAB — BASIC METABOLIC PANEL
BUN: 6 mg/dL (ref 6–23)
BUN: 9 mg/dL (ref 6–23)
BUN: 17 mg/dL (ref 6–23)
CO2: 28 mEq/L (ref 19–32)
Calcium: 8.8 mg/dL (ref 8.4–10.5)
Chloride: 98 mEq/L (ref 96–112)
Chloride: 104 mEq/L (ref 96–112)
Creatinine, Ser: 0.77 mg/dL (ref 0.4–1.5)
GFR calc non Af Amer: >60 mL/min (ref >60)
Glucose, Bld: 143 mg/dL — ABNORMAL HIGH (ref 70–99)
Potassium: 4.3 mEq/L (ref 3.5–5.1)
Potassium: 5 mEq/L (ref 3.5–5.1)
Potassium: 4.1 mEq/L (ref 3.5–5.1)
Sodium: 136 mEq/L (ref 135–145)
Sodium: 139 mEq/L (ref 135–145)

## 2010-05-04 LAB — URINALYSIS, ROUTINE W REFLEX MICROSCOPIC
Glucose, UA: NEGATIVE mg/dL
Hgb urine dipstick: NEGATIVE
Specific Gravity, Urine: 1.022 (ref 1.005–1.030)
pH: 5 (ref 5.0–8.0)

## 2010-05-04 LAB — DIFFERENTIAL
Eosinophils Absolute: 0.1 10*3/uL (ref 0.0–0.7)
Eosinophils Relative: 2 % (ref 0–5)
Lymphocytes Relative: 21 % (ref 12–46)
Lymphs Abs: 1.3 10*3/uL (ref 0.7–4.0)
Monocytes Absolute: 0.8 10*3/uL (ref 0.1–1.0)

## 2010-05-04 LAB — PROTIME-INR: Prothrombin Time: 13.1 seconds (ref 11.6–15.2)

## 2010-05-04 LAB — TYPE AND SCREEN
ABO/RH(D): A NEG
Antibody Screen: NEGATIVE

## 2010-05-04 LAB — ABO/RH: ABO/RH(D): A NEG

## 2010-06-03 ENCOUNTER — Other Ambulatory Visit: Payer: Self-pay | Admitting: *Deleted

## 2010-06-03 MED ORDER — ALLOPURINOL 300 MG PO TABS: 300.0000 mg | ORAL_TABLET | Freq: Every day | ORAL | Status: DC

## 2010-06-17 NOTE — Op Note (Signed)
NAME:  Bryce Maldonado, Bryce Maldonado NO.:  [id number]   MEDICAL RECORD NO.:  [id number]                   PATIENT TYPE:  INP   LOCATION:  2892                                 FACILITY:  [facility]   PHYSICIAN:  Cristi Loron, M.D.            DATE OF BIRTH:  1940-04-22   DATE OF PROCEDURE:  11/26/2002  DATE OF DISCHARGE:                                 OPERATIVE REPORT   BRIEF HISTORY AND PHYSICAL:  The patient is a 70 year old white male who has  suffered from right hip and leg pain.  He failed medical management and was  worked up with a lumbar MRI which demonstrated an extraforaminal, ie far  lateral, herniated disk at L4-5 on the right.  The patient's signs,  symptoms, and physical examination were consistent with a right L4  radiculopathy.  I discussed the various treatment options with him including  surgery. The patient weighed the risks, benefits, and alternatives of  surgery and decided to proceed with the operation.   PREOPERATIVE DIAGNOSIS:  Right L4-5 fallout herniated nucleus pulposus,  stenosis, lumbar radiculopathy, lumbago.   POSTOPERATIVE DIAGNOSIS:  Right L4-5 fallout herniated nucleus pulposus,  stenosis, lumbar radiculopathy, lumbago.   PROCEDURE:  Right L4-5 extraforaminal (ie far lateral) microdiskectomy using  microdissection.   SURGEON:  Cristi Loron, M.D.   ASSISTANT:  Hilda Lias, M.D.   ANESTHESIA:  General endotracheal.   ESTIMATED BLOOD LOSS:  Minimal.   SPECIMENS:  None.   DRAINS:  None.   COMPLICATIONS:  None.   DESCRIPTION OF PROCEDURE:  The patient was brought to the operating room by  the anesthesia team.  General endotracheal anesthesia was induced.  The  patient was then turned to the prone position on a Wilson frame.  His  lumbosacral region was then prepared with Betadine scrub and Betadine  solution.  Sterile drapes were applied.  I then injected the area to be  incised with Marcaine with epinephrine  solution.  I used a scalpel to make a  linear midline incision over the L4-5 interspace.  I used electrocautery to  dissect down to the thoracolumbar fascia.  I divided the fascia just to the  right of the midline performing a right-sided subperiosteal dissection and  stripped the musculature from the spinous process of the lamina on the right  at L4 and L5.  I inserted the McCullough retractor and then obtained an  intraoperative radiograph to confirm our location.  We then brought the  operative microscope into the field and under instant magnification and  illumination, we completed the microdissection/decompression.  We used a  high speed drill to remove the lateral aspect of the right L4 pars region.  This exposed the underlying intertransverse ligament.  We dissected through  it with a nerve hook and then removed it with aggressive punch and then used  microdissection to dissect through the intertransverse Allis muscle and  identified the  exiting right L4 nerve root as it exited around the right L4  pedicle.  We then used microdissection to dissect caudal to the nerve root.  This exposed the foraminal and extraforaminal intervertebral disk.  It was  herniated and there was quite a bit of spondylosis.  We then incised the  extraforaminal herniated disk with the 15 blade scalpel and performed a  partial diskectomy using the pituitary forceps.  We then used the osteophyte  tool to remove some spondylosis from the vertebral endplate to further  decompress the right L4 nerve root.  During this decompression, Dr. Jeral Fruit  carefully held the nerve root with the D'Errico retractor in the cephalad  and lateral direction.  We then palpated along the exit route of the right  L4 nerve root and noted it was well decompressed from the intraspinal  portion all the way out into the soft tissues.  We then obtained stringent  hemostasis with bipolar electrocautery.  We copiously irrigated the wound  out  with bacitracin solution, removed the solution, and then removed the  [facility] retractor.  We then reapproximated the patient's thoracolumbar  fascia with interrupted #1 Vicryl suture, subcutaneous tissue with  interrupted 2-0 Vicryl suture, and the skin with Steri-Strips and Benzoin.  The wound was then coated with bacitracin ointment.  A sterile dressing was  applied.  The drapes were removed and the patient was subsequently returned  to the supine position where he was extubated by the anesthesia team and  transported to the postanesthesia care unit in stable condition.  All  needle, sponge, and instrument counts correct at the end of this case.                                               Cristi Loron, M.D.    JDJ/MEDQ  D:  11/26/2002  T:  11/26/2002  Job:  [id number]

## 2010-06-24 ENCOUNTER — Encounter: Payer: Self-pay | Admitting: Internal Medicine

## 2010-06-24 ENCOUNTER — Ambulatory Visit (INDEPENDENT_AMBULATORY_CARE_PROVIDER_SITE_OTHER): Payer: Medicare Other | Admitting: Internal Medicine

## 2010-06-24 DIAGNOSIS — M109 Gout, unspecified: Secondary | ICD-10-CM

## 2010-06-24 DIAGNOSIS — Z23 Encounter for immunization: Secondary | ICD-10-CM

## 2010-06-24 DIAGNOSIS — R7309 Other abnormal glucose: Secondary | ICD-10-CM

## 2010-06-24 DIAGNOSIS — I2510 Atherosclerotic heart disease of native coronary artery without angina pectoris: Secondary | ICD-10-CM

## 2010-06-24 DIAGNOSIS — E785 Hyperlipidemia, unspecified: Secondary | ICD-10-CM

## 2010-06-24 DIAGNOSIS — Z0000 Encounter for general adult medical examination without abnormal findings: Secondary | ICD-10-CM

## 2010-06-24 DIAGNOSIS — Z8546 Personal history of malignant neoplasm of prostate: Secondary | ICD-10-CM

## 2010-06-24 LAB — LIPID PANEL
HDL: 52.8 mg/dL (ref >39.00)
Triglycerides: 224 mg/dL — ABNORMAL HIGH (ref 0.0–149.0)

## 2010-06-24 LAB — HEPATIC FUNCTION PANEL
ALT: 24 U/L (ref 0–53)
AST: 18 U/L (ref 0–37)
Albumin: 4.2 g/dL (ref 3.5–5.2)
Alkaline Phosphatase: 73 U/L (ref 39–117)

## 2010-06-24 LAB — BASIC METABOLIC PANEL
CO2: 26 mEq/L (ref 19–32)
Calcium: 9.7 mg/dL (ref 8.4–10.5)
Creatinine, Ser: 0.9 mg/dL (ref 0.4–1.5)
GFR: 87.87 mL/min (ref >60.00)
Sodium: 138 mEq/L (ref 135–145)

## 2010-06-24 LAB — TSH: TSH: 0.82 u[IU]/mL (ref 0.35–5.50)

## 2010-06-24 LAB — URIC ACID: Uric Acid, Serum: 4.2 mg/dL (ref 4.0–7.8)

## 2010-06-24 MED ORDER — ZOSTER VACCINE LIVE 19400 UNT/0.65ML ~~LOC~~ SOLR
0.6500 mL | Freq: Once | SUBCUTANEOUS | Status: AC
  Administered 2010-06-24: 19400 [IU] via SUBCUTANEOUS

## 2010-06-24 NOTE — Assessment & Plan Note (Signed)
[name] ; PSA 0.7 (?) in fall 2011

## 2010-06-24 NOTE — Patient Instructions (Signed)
The minimal goal to prevent gout is a  uric acid  value  less than 7 .Preferred is less than 6 and ideal is less than 5 .   Preventive Health Care: Exercise at least 30-45 minutes a day,  3-4 days a week.  Eat a low-fat diet with lots of fruits and vegetables, up to 7-9 servings per day. Avoid obesity; your goal is waist measurement < 40 inches.Consume less than 40 grams of sugar per day from foods & drinks with High Fructose Corn Sugar as #2,3 or # 4 on label.

## 2010-06-24 NOTE — Progress Notes (Signed)
Subjective:    Patient ID: MAICOL BOWLAND, male    DOB: [birth year], 70 y.o.   MRN: [id number]  HPI Medicare Wellness Visit:  The following psychosocial & medical history were reviewed as required by Medicare.   Social history: caffeine: occasional diet decaf cola; rare tea & coffee , alcohol:  < 1 / day ,  tobacco use : 1960-1975 > 1 ppd  & exercise : walking 5X/week .   Home & personal  safety / fall risk: no balance or home safety issues, activities of daily living:  No limitations , seatbelt use : yes , and smoke alarm employment : yes .  Power of Attorney/Living Will status : in place   Vision ( as recorded per Nurse) & Hearing  evaluation :  Whisper heard @ 6 ft. Orientation :oriented x 3 , memory & recall :good , math :  Good ,and mood & affect : normal . Depression / anxiety: denied Travel history : 06/2009 [place] , immunization status :Shingles today(Pneumox due) , transfusion history:  ? 1995 with CABG, and preventive health surveillance ( colonoscopies, BMD , etc as per protocol/ SOC):  Up to date, Dental care:  Seen every 6 mos . Chart reviewed &  Updated. Active issues reviewed & addressed.       Review of Systems Patient reports no  vision/ hearing changes,anorexia, weight change, fever ,adenopathy, persistant / recurrent hoarseness, swallowing issues, chest pain,palpitations, edema,persistant / recurrent cough, hemoptysis, dyspnea(rest, exertional, paroxysmal nocturnal), gastrointestinal  bleeding (melena, rectal bleeding), abdominal pain, excessive heart burn, GU symptoms( dysuria, hematuria, pyuria, voiding/incontinence  issues) syncope, focal weakness, memory loss,numbness & tingling, skin/hair/nail changes, abnormal bruising/bleeding, musculoskeletal symptoms/signs. Intermittent tinnitus, left greater than right ear.    Objective:   Physical Exam Gen.: Healthy and well-nourished in appearance. Alert, appropriate and cooperative throughout exam. Head: Normocephalic  without obvious abnormalities;   pattern alopecia  Eyes: No corneal or conjunctival inflammation noted. Pupils equal round reactive to light and accommodation. Fundal exam is benign without hemorrhages, exudate, papilledema. Extraocular motion intact.  Ears: External  ear exam reveals no significant lesions or deformities. Canals clear .TMs normal. Hearing is grossly normal bilaterally. Nose: External nasal exam reveals no deformity or inflammation. Nasal mucosa are pink and moist. No lesions or exudates noted. Septum dislocated to L   Mouth: Oral mucosa and oropharynx reveal no lesions or exudates. Recent extraction L mandible. Neck: No deformities, masses, or tenderness noted. Range of motion & . Thyroid  normal. Lungs: Normal respiratory effort; chest expands symmetrically. Lungs are clear to auscultation without rales, wheezes, or increased work of breathing. Heart: Normal rate and rhythm. Normal S1 and S2. No gallop, click, or rub.No murmur. Abdomen: Bowel sounds normal; abdomen soft and nontender. No masses, organomegaly or hernias noted. Genitalia:  [name] .  Musculoskeletal/extremities: No deformity or scoliosis noted of  the thoracic or lumbar spine. No clubbing, cyanosis, edema, or deformity noted. Range of motion  normal .Tone & strength  normal.Joints normal. Nail health  good. Vascular: Carotid, radial artery, dorsalis pedis and dorsalis posterior tibial pulses are full and equal. No bruits present. Neurologic: Alert and oriented x3. Deep tendon reflexes symmetrical and normal.                                                                            Skin: Intact without suspicious lesions or rashes. Lymph: No cervical, axillary, or inguinal lymphadenopathy present. Psych: Mood and affect are normal. Normally interactive                                                                                          Assessment & Plan:   #1 Medicare wellness visit; criteria met and data entered into the chart  #2 coronary disease; grafts patent  @ cath 01/12  #3 dyslipidemia  #4 fasting hyperglycemia, past medical history of  #5 gout, past medical history  Plan: See orders and recommendations.

## 2010-06-24 NOTE — Assessment & Plan Note (Signed)
Dr Peter [place] performed cath 01/2010; grafts patent. S/P 4 vessel CABG 1995

## 2010-06-27 ENCOUNTER — Encounter: Payer: Self-pay | Admitting: Internal Medicine

## 2010-08-15 ENCOUNTER — Other Ambulatory Visit: Payer: Self-pay | Admitting: Cardiology

## 2010-08-15 DIAGNOSIS — Z0000 Encounter for general adult medical examination without abnormal findings: Secondary | ICD-10-CM

## 2010-08-15 DIAGNOSIS — R7309 Other abnormal glucose: Secondary | ICD-10-CM

## 2010-08-15 DIAGNOSIS — I2510 Atherosclerotic heart disease of native coronary artery without angina pectoris: Secondary | ICD-10-CM

## 2010-08-15 DIAGNOSIS — Z23 Encounter for immunization: Secondary | ICD-10-CM

## 2010-08-15 DIAGNOSIS — Z8546 Personal history of malignant neoplasm of prostate: Secondary | ICD-10-CM

## 2010-08-15 DIAGNOSIS — M109 Gout, unspecified: Secondary | ICD-10-CM

## 2010-08-15 DIAGNOSIS — E785 Hyperlipidemia, unspecified: Secondary | ICD-10-CM

## 2010-08-15 MED ORDER — ROSUVASTATIN CALCIUM 10 MG PO TABS: 10.0000 mg | ORAL_TABLET | Freq: Every day | ORAL | Status: DC

## 2010-08-15 NOTE — Telephone Encounter (Signed)
Wants [place] to refill his Crestor to [facility] in [place].

## 2010-08-15 NOTE — Telephone Encounter (Signed)
Called requesting refill on Crestor. Sent to [facility]

## 2010-08-25 IMAGING — CR DG PELVIS 1-2V
1 series · 1 of 1 positions shown · non-contrast
Comparison: None

CLINICAL DATA: Postop right total hip replacement.

PELVIS - 1-2 VIEW

[view not recorded]
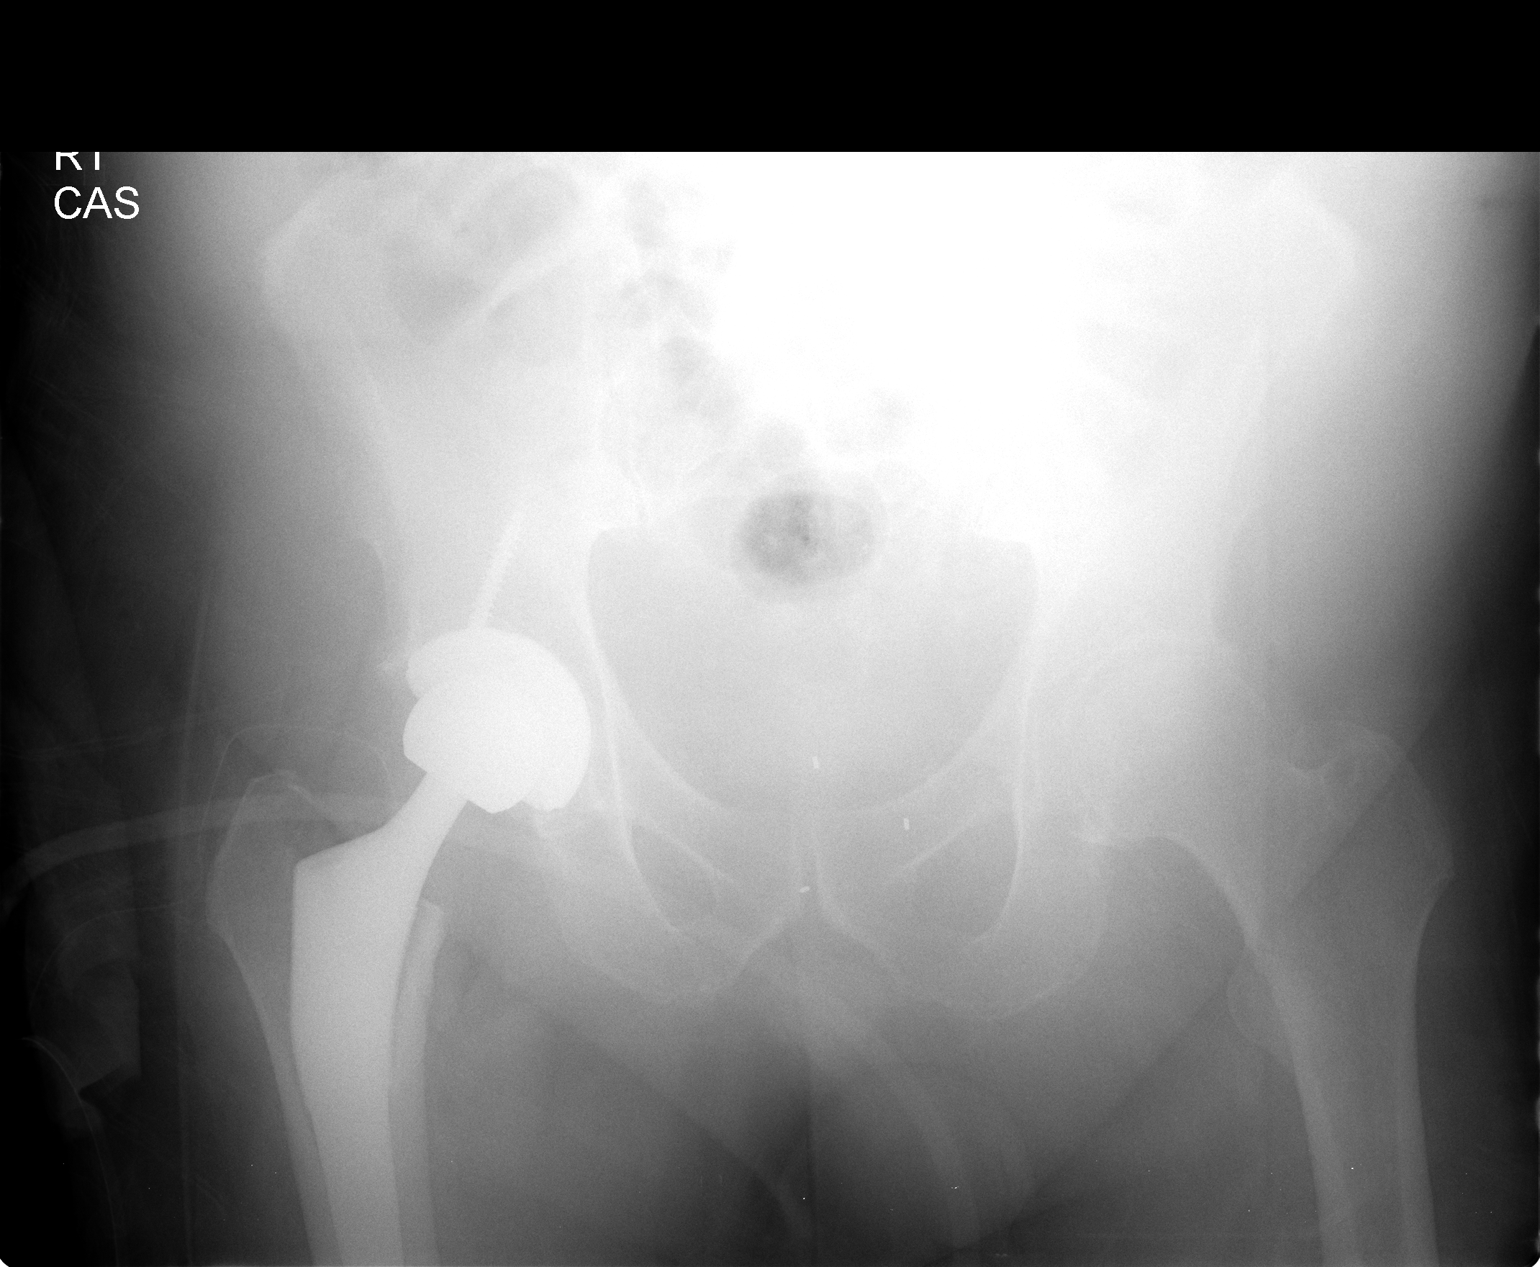

[1 of 1 positions shown; findings below may reference images not displayed]

FINDINGS: 7575 hours.  Single view is submitted.  Patient is status
post right total hip replacement with a screw fixed acetabular
component.  The distal end of the femoral prosthesis is not imaged
on this view.  No complications are evident.  A surgical drain is
in place.
IMPRESSION: Postop right total hip replacement without demonstrated
complication.  The distal end of the femoral prosthesis is not
imaged.

## 2010-09-02 ENCOUNTER — Encounter: Payer: Self-pay | Admitting: *Deleted

## 2010-09-05 ENCOUNTER — Encounter: Payer: Self-pay | Admitting: Cardiology

## 2010-09-05 ENCOUNTER — Ambulatory Visit (INDEPENDENT_AMBULATORY_CARE_PROVIDER_SITE_OTHER): Payer: Medicare Other | Admitting: Cardiology

## 2010-09-05 DIAGNOSIS — E785 Hyperlipidemia, unspecified: Secondary | ICD-10-CM

## 2010-09-05 DIAGNOSIS — I2510 Atherosclerotic heart disease of native coronary artery without angina pectoris: Secondary | ICD-10-CM

## 2010-09-05 NOTE — Assessment & Plan Note (Signed)
Blood test results are noted in Epic. He still has elevated triglycerides. This should improve with further weight loss. He does have fasting hyperglycemia and was scheduled to have an A1c performed. I do not have these results.

## 2010-09-05 NOTE — Patient Instructions (Signed)
Continue your current medications  I will see you again in 6 months.   

## 2010-09-05 NOTE — Assessment & Plan Note (Signed)
He remains asymptomatic. We will continue with risk factor modification. I have encouraged him with his weight loss. Hopefully now that his hips have been replaced he can increase his aerobic activity.

## 2010-09-05 NOTE — Progress Notes (Signed)
Bryce Maldonado Date of Birth: [birth year]   History of Present Illness: Bryce Maldonado is seen for followup today. Since his last visit he underwent left total hip replacement in February of this year. It has taken him longer to recover from the surgery than from his right hip replacement previously. He is doing well from a cardiac standpoint without any significant chest pain or shortness of breath. He has lost 3 pounds since his last visit.  Current Outpatient Prescriptions on File Prior to Visit  Medication Sig Dispense Refill  . allopurinol (ZYLOPRIM) 300 MG tablet Take 1 tablet (300 mg total) by mouth daily.  90 tablet  0  . aspirin 81 MG tablet Take 81 mg by mouth daily.        [name] ezetimibe (ZETIA) 10 MG tablet Take 10 mg by mouth daily.        . fexofenadine (ALLEGRA) 180 MG tablet Take 180 mg by mouth daily.        . folic acid (FOLVITE) 1 MG tablet Take 1 mg by mouth daily.        . Multiple Vitamins-Minerals (CENTRUM PO) Take by mouth daily.        . Nutritional Supplements (MELATONIN PO) Take by mouth.        . rosuvastatin (CRESTOR) 10 MG tablet Take 1 tablet (10 mg total) by mouth daily.  30 tablet  5    No Known Allergies  Past Medical History  Diagnosis Date  . Spinal stenosis 2004    L4 radiculopathy, herniated nucleus pulposus L4-5  . Dyslipidemia   . Coronary artery disease     post CABG x4 -- in 1995 -- due to three-vessel coronary artery disease  . BPH (benign prostatic hyperplasia)     Elevated PSA  . Anemia   . Gout   . Diverticular disease   . Childhood asthma   . Osteoarthritis   . Tinnitus     Past Surgical History  Procedure Date  . Salivary gland surgery 2001    Stone  . Appendectomy   . Coronary artery bypass graft 10/21/1993    x4 -- using the LIM artery graft to the LAD artery, with saphenous vein grafts to the diagonal branch of the LAD, OM branch the left circumflex coronary artery, and the posterior descending branch of the RCA -- Est. EF of 65%--  Surgeon: Alleen Borne, M.D.              . Colonoscopy 2003     Negative, Dr. Leone Payor  . Prostate biopsy 10/2008  . Total hip arthroplasty November 2010    right  . Total hip arthroplasty February 2012    left  . Microdiscectomy lumbar 2004    L4-5, Dr. Lovell Sheehan  . Flexible sigmoidoscopy 2005     external hemorrhoids  . Cardiac catheterization 02/10/2010    Est. EF of 60-65% -- Severe three-vessel obstructive atherosclerotic coronary artery disease -- All grafts were patent including left internal mammary artery graft to left anterior descending coronary artery, saphenous vein graft to the diagonal, saphenous vein graft to obtuse marginal vessel, and saphenous vein graft to the posterior descending coronary artery -- Normal left ventricular function     History  Smoking status  . Former Smoker -- 2.5 packs/day for 15 years  . Quit date: 01/30/1970  Smokeless tobacco  . Not on file    History  Alcohol Use  . Yes     < 2 / day  Family History  Problem Relation Age of Onset  . Diabetes Father   . Hypertension Father   . Lung cancer Father   . Heart failure Mother 33  . Hyperlipidemia Sister   . Coronary artery disease Sister   . Coronary artery disease Paternal Aunt     X 2    Review of Systems: The review of systems is positive for some residual soreness in his left hip.   Other systems are reviewed and are negative BP 142/82  Pulse 74  Ht 6' (1.829 m)  Wt 228 lb 3.2 oz (103.511 kg)  BMI 30.95 kg/m2 He is a pleasant white male in no acute distress. His HEENT exam is unremarkable. He has no JVD or bruits. Lungs are clear. Cardiac exam reveals a regular rate and rhythm without gallop, murmur, or click. Abdomen is soft and nontender. He has no masses or bruits. Femoral and pedal pulses are good. He is alert and oriented x3. Cranial nerves II through XII are intact. LABORATORY DATA:   Assessment / Plan:

## 2010-09-15 ENCOUNTER — Encounter: Payer: Self-pay | Admitting: Internal Medicine

## 2010-09-15 ENCOUNTER — Ambulatory Visit (INDEPENDENT_AMBULATORY_CARE_PROVIDER_SITE_OTHER): Payer: Medicare Other | Admitting: Internal Medicine

## 2010-09-15 VITALS — BP 144/98 | HR 98 | Temp 97.6°F | Resp 14 | Wt 228.0 lb

## 2010-09-15 DIAGNOSIS — J0100 Acute maxillary sinusitis, unspecified: Secondary | ICD-10-CM

## 2010-09-15 MED ORDER — FLUTICASONE PROPIONATE 50 MCG/ACT NA SUSP: 1.0000 | NASAL | Status: DC

## 2010-09-15 MED ORDER — AMOXICILLIN-POT CLAVULANATE 875-125 MG PO TABS: 1.0000 | ORAL_TABLET | ORAL | Status: AC

## 2010-09-15 NOTE — Progress Notes (Signed)
  Subjective:    Patient ID: Bryce Maldonado, male    DOB: August 24, 1940, 70 y.o.   MRN: [id number]  HPI Respiratory tract infection Onset/symptoms:8/11 as dental pain & R facial & temple  pain  Exposures (illness/environmental/extrinsic):no Progression of symptoms:stable Treatments/response:Tylenol Severe Sinus & Allergy Present symptoms:no change Fever/chills/sweats:no Frontal headache:no Nasal purulence:no Sore throat:no Dental pain:still present; Dentist stated exam negative Lymphadenopathy:no Wheezing/shortness of breath:no Cough/sputum/hemoptysis:no Associated extrinsic/allergic symptoms:itchy eyes/ sneezing:no Past medical history: asthma:as child only Smoking history:quit 1977        Review of Systems because of total hip replacements he takes amoxicillin 500 mg 4 pills before dental work. He took this on Monday.     Objective:   Physical Exam General appearance is of good health and nourishment; no acute distress or increased work of breathing is present.  No  lymphadenopathy about the head, neck, or axilla noted.   Eyes: No conjunctival inflammation or lid edema is present.  Ears:  External ear exam shows no significant lesions or deformities.  Otoscopic examination reveals clear canals, tympanic membranes are intact bilaterally without bulging, retraction, inflammation or discharge.  Nose:  External nasal examination shows no deformity or inflammation. Nasal mucosa are pink and moist without lesions or exudates. No septal dislocation or dislocation.No obstruction to airflow.   Oral exam: Dental hygiene is good; lips and gums are healthy appearing.There is no oropharyngeal erythema or exudate noted.    Heart:  Normal rate and regular rhythm. S1 and S2 normal without gallop, murmur, click, rub .S4  Lungs:Chest clear to auscultation; no wheezes, rhonchi,rales ,or rubs present.No increased work of breathing.    Extremities:  No cyanosis, edema, or clubbing  noted     Skin: Warm & dry        Assessment & Plan:     #1 maxillary sinusitis, acute, right-sided  Plan: See orders and recommendations.

## 2010-09-15 NOTE — Patient Instructions (Addendum)
Plain Mucinex for thick secretions ;force NON dairy fluids for next 48 hrs. Use a Neti pot daily as needed for sinus congestion . Avoid decongestants as they can raise blood pressure. Monitor your blood pressure off the decongestants.Blood Pressure Goal  Ideally is an AVERAGE < 135/85. This AVERAGE should be calculated from @ least 5-7 BP readings taken @ different times of day on different days of week. You should not respond to isolated BP readings , but rather the AVERAGE for that week

## 2010-09-18 ENCOUNTER — Other Ambulatory Visit: Payer: Self-pay | Admitting: Internal Medicine

## 2010-09-19 ENCOUNTER — Telehealth: Payer: Self-pay | Admitting: Internal Medicine

## 2010-09-19 MED ORDER — LEVOFLOXACIN 500 MG PO TABS: 500.0000 mg | ORAL_TABLET | Freq: Every day | ORAL | Status: DC

## 2010-09-19 NOTE — Telephone Encounter (Signed)
Generic Levaquin 500 mg #5 ; stop Augmentin

## 2010-09-19 NOTE — Telephone Encounter (Signed)
Pt aware medication called to pharmacy and to stop augmentin.

## 2010-09-19 NOTE — Telephone Encounter (Signed)
Pt called left msg on triage voicemail doesn't think that symptoms are improving was seen on 8/16 for sinus infection and still w/ pain in teeth and R ear. Thinks that medication should be changed to something else.

## 2010-09-22 ENCOUNTER — Emergency Department (HOSPITAL_BASED_OUTPATIENT_CLINIC_OR_DEPARTMENT_OTHER)
Admission: EM | Admit: 2010-09-22 | Discharge: 2010-09-22 | Disposition: A | Discharge status: Home / Self Care | Payer: Medicare Other | Attending: Emergency Medicine | Admitting: Emergency Medicine

## 2010-09-22 ENCOUNTER — Encounter (HOSPITAL_BASED_OUTPATIENT_CLINIC_OR_DEPARTMENT_OTHER): Payer: Self-pay | Admitting: *Deleted

## 2010-09-22 DIAGNOSIS — R51 Headache: Secondary | ICD-10-CM | POA: Insufficient documentation

## 2010-09-22 DIAGNOSIS — H9209 Otalgia, unspecified ear: Secondary | ICD-10-CM | POA: Insufficient documentation

## 2010-09-22 DIAGNOSIS — E785 Hyperlipidemia, unspecified: Secondary | ICD-10-CM | POA: Insufficient documentation

## 2010-09-22 DIAGNOSIS — I2510 Atherosclerotic heart disease of native coronary artery without angina pectoris: Secondary | ICD-10-CM | POA: Insufficient documentation

## 2010-09-22 HISTORY — DX: Malignant neoplasm of prostate: C61

## 2010-09-22 MED ORDER — OXYCODONE-ACETAMINOPHEN 5-325 MG PO TABS: 1.0000 | ORAL_TABLET | Freq: Four times a day (QID) | ORAL | Status: AC | PRN

## 2010-09-22 MED ORDER — OXYCODONE-ACETAMINOPHEN 5-325 MG PO TABS: 2.0000 | ORAL_TABLET | Freq: Once | ORAL | Status: DC

## 2010-09-22 MED ORDER — OXYCODONE-ACETAMINOPHEN 5-325 MG PO TABS
2.0000 | ORAL_TABLET | Freq: Once | ORAL | Status: AC
  Administered 2010-09-22: 2 via ORAL
  Filled 2010-09-22: qty 2

## 2010-09-22 NOTE — ED Provider Notes (Signed)
History     CSN: [id number] Arrival date & time: 09/22/2010  8:10 PM  Chief Complaint  Patient presents with  . Otalgia  . Facial Pain   Patient is a 70 y.o. male presenting with ear pain. The history is provided by the patient. No language interpreter was used.  Otalgia This is a new problem. The current episode started more than 1 week ago. There is pain in the right ear. The problem occurs constantly. The problem has not changed since onset.There has been no fever. The pain is moderate. Pertinent negatives include no headaches, no hearing loss, no rhinorrhea, no neck pain and no rash.    Past Medical History  Diagnosis Date  . Spinal stenosis 2004    L4 radiculopathy, herniated nucleus pulposus L4-5  . Dyslipidemia   . Coronary artery disease     post CABG x4 -- in 1995 -- due to three-vessel coronary artery disease  . BPH (benign prostatic hyperplasia)     Elevated PSA  . Anemia   . Gout   . Diverticular disease   . Childhood asthma   . Osteoarthritis   . Tinnitus   . Prostate cancer     Past Surgical History  Procedure Date  . Salivary gland surgery 2001    Stone  . Appendectomy   . Coronary artery bypass graft 10/21/1993    x4 -- using the LIM artery graft to the LAD artery, with saphenous vein grafts to the diagonal branch of the LAD, OM branch the left circumflex coronary artery, and the posterior descending branch of the RCA -- Est. EF of 65%-- Surgeon: Alleen Borne, M.D.              . Colonoscopy 2003     Negative, Dr. Leone Payor  . Prostate biopsy 10/2008  . Total hip arthroplasty November 2010    right  . Total hip arthroplasty February 2012    left  . Microdiscectomy lumbar 2004    L4-5, Dr. Lovell Sheehan  . Flexible sigmoidoscopy 2005     external hemorrhoids  . Cardiac catheterization 02/10/2010    Est. EF of 60-65% -- Severe three-vessel obstructive atherosclerotic coronary artery disease -- All grafts were patent including left internal mammary artery  graft to left anterior descending coronary artery, saphenous vein graft to the diagonal, saphenous vein graft to obtuse marginal vessel, and saphenous vein graft to the posterior descending coronary artery -- Normal left ventricular function   . Joint replacement     Family History  Problem Relation Age of Onset  . Diabetes Father   . Hypertension Father   . Lung cancer Father   . Heart failure Mother 36  . Hyperlipidemia Sister   . Coronary artery disease Sister   . Coronary artery disease Paternal Aunt     X 2    History  Substance Use Topics  . Smoking status: Former Smoker -- 2.5 packs/day for 15 years    Quit date: 01/30/1970  . Smokeless tobacco: Not on file  . Alcohol Use: Yes      < 2 / day      Review of Systems  HENT: Positive for ear pain. Negative for hearing loss, rhinorrhea and neck pain.   Skin: Negative for rash.  Neurological: Negative for headaches.  All other systems reviewed and are negative.    Physical Exam  BP 129/86  Pulse 86  Temp(Src) 97.8 F (36.6 C) (Oral)  Resp 20  SpO2 97%  Physical Exam  Nursing note and vitals reviewed. Constitutional: He is oriented to person, place, and time. He appears well-developed and well-nourished.  HENT:  Head: Normocephalic and atraumatic.  Right Ear: External ear normal.  Left Ear: External ear normal.  Mouth/Throat: Oropharynx is clear and moist.  Eyes: Pupils are equal, round, and reactive to light.  Neck: Normal range of motion. Neck supple.  Cardiovascular: Normal rate and regular rhythm.   Pulmonary/Chest: Effort normal and breath sounds normal.  Musculoskeletal: Normal range of motion.  Neurological: He is alert and oriented to person, place, and time.  Skin: Skin is warm and dry.  Psychiatric: He has a normal mood and affect.    ED Course  Procedures  MDM Discussed with pt that he can follow up with Z[facility]:[id number] possibility of shingles:unlikely sinusitis with antibiotics continuing to  not work:differential included temporal arteritis   Medical screening examination/treatment/procedure(s) were conducted as a shared visit with non-physician practitioner(s) and myself.  I personally evaluated the patient during the encounter I examined pt and his exam was entirely benign.  I advised him to take medication for pain and to have ENT followup. Osvaldo Human, M.D.    Teressa Lower, NP 09/22/10 [id number]  Teressa Lower, NP 09/22/10 [id number]  Carleene Cooper III, MD 09/23/10 [phone]

## 2010-09-22 NOTE — ED Notes (Signed)
Pt reports pain to right jaw that began approx 10days ago. Pain has been spreading to underneath right eye and right ear. Was told it was probably a sinus infection, and started on amoxicillin 1 week ago, but was switched to another ABX several days later d/t no improvement. States the pain continues despite being on meds. Hx of allergies and takes allegra daily, however denies that his allergy symptoms are worse than normal. Denies cold, cough, fever or other symptoms.

## 2010-09-22 NOTE — ED Notes (Signed)
Pt with right ear pain and facial pain x 10 days pt has seen dentist and PCP and was dx with sinus infection pain has continued despite current abx tx

## 2010-09-23 ENCOUNTER — Ambulatory Visit: Payer: Medicare Other | Admitting: Internal Medicine

## 2010-11-24 ENCOUNTER — Telehealth: Payer: Self-pay | Admitting: Internal Medicine

## 2010-11-24 NOTE — Telephone Encounter (Signed)
Dr.Hopper please advise 

## 2010-11-24 NOTE — Telephone Encounter (Signed)
The pt called requesting a rx for orthopedic shoe inserts.

## 2010-11-28 NOTE — Telephone Encounter (Signed)
I reviewed the chart; the only orthopedic diagnosis is  gout which would not cover orthotic inserts. Has a podiatrist made a diagnosis which would cover this; otherwise insurance would not likely  pay for them

## 2010-11-28 NOTE — Telephone Encounter (Signed)
Okay; use gout & and plantar fascia variant as his diagnoses.

## 2010-11-28 NOTE — Telephone Encounter (Signed)
Patient stated the insurance paid for it before Dr.Hopper please advise

## 2010-11-28 NOTE — Telephone Encounter (Signed)
Pt is calling on status of request.

## 2010-11-29 MED ORDER — FOOT AND SHOE PADDING PADS: MEDICATED_PAD | Status: DC

## 2010-11-29 NOTE — Telephone Encounter (Signed)
Addended by: Edgardo Roys on: 11/29/2010 10:35 AM   Modules accepted: Orders

## 2010-11-29 NOTE — Telephone Encounter (Signed)
RX placed at the front for pick-up, left message on voicemail informing patient rx available

## 2010-12-12 ENCOUNTER — Encounter: Payer: Self-pay | Admitting: Internal Medicine

## 2010-12-12 ENCOUNTER — Ambulatory Visit (INDEPENDENT_AMBULATORY_CARE_PROVIDER_SITE_OTHER): Payer: Medicare Other | Admitting: Internal Medicine

## 2010-12-12 VITALS — BP 124/80 | HR 80 | Temp 98.4°F | Wt 216.6 lb

## 2010-12-12 DIAGNOSIS — G479 Sleep disorder, unspecified: Secondary | ICD-10-CM

## 2010-12-12 DIAGNOSIS — I499 Cardiac arrhythmia, unspecified: Secondary | ICD-10-CM

## 2010-12-12 DIAGNOSIS — M79609 Pain in unspecified limb: Secondary | ICD-10-CM

## 2010-12-12 DIAGNOSIS — M79652 Pain in left thigh: Secondary | ICD-10-CM

## 2010-12-12 NOTE — Patient Instructions (Signed)
To prevent sleep dysfunction follow these instructions for sleep hygiene. Do not read, watch TV, or eat in bed. Do not get into bed until you are ready to turn off the light &  to go to sleep. Do not ingest stimulants ( decongestants, diet pills, nicotine, caffeine) after the evening meal.  Take the clonazepam if you do wake up after going to sleep. Use a memory foam pillow  when he is sleeping on either side.

## 2010-12-12 NOTE — Progress Notes (Signed)
Subjective:    Patient ID: Bryce Maldonado, male    DOB: Feb 02, 1940, 70 y.o.   MRN: [id number]  HPI Extremity pain Location:L thigh Onset:since THR 03/15/2010 Trigger/injury:pain began with walking in Rehab; initially lower 1/3 of thigh, but from knee to hip; some skip areas @ times Pain quality: dull; occasional cramping Pain severity: up to 4 Duration:resolves sitting & with continued walking Exacerbating factors:asnoted plus either LLD position; sleeping on back Treatment/response: Mobic/ no benefit;Aleve / some benefit Review of systems: Constitutional: no fever, chills, sweats. Weight loss is purposeful.  Musculoskeletal:no  joint stiffness, redness, or swelling Skin:no rash, color change Neuro: no weakness; incontinence (stool/urine); numbness and tingling Heme:no lymphadenopathy; abnormal bruising or bleeding    Insomnia: Onset:4weeks Pattern: Difficulty going to sleep:no Frequent awakening:no but staying awake after going to BR; nocturia X1- 2 Early awakening:yes  Nightmares:no Abnormal leg movement:no Snoring:no Apnea:no Risk factors/sleep hygiene: Stimulants:no Alcohol intake:no Reading, watching TV, eating @ bedtime:watches tv & reads Daytime naps:no Stress/anxiety:"ruminating over leg pain"; major life stressors in past 3 years Work/travel factors:no Impact: Daytime hypersomnolence: no Motor vehicle accident/motor dysfunction:no Treatment to date/efficacy: Melatonin , Aleve & Tylenol PM occasionally . The last helps most        Review of Systems     Objective:   Physical Exam Gen.: Healthy and well-nourished in appearance. Alert, appropriate and cooperative throughout exam.  Neck: No deformities, masses, or tenderness noted. Range of motion normal. Thyroid normal. Lungs: Normal respiratory effort; chest expands symmetrically. Lungs are clear to auscultation without rales, wheezes, or increased work of breathing. Heart: Irregular  rhythm. Normal S1 and  S2. No gallop, click, or rub. Flow murmur. Abdomen: Bowel sounds normal; abdomen soft and nontender. No masses, organomegaly or hernias noted.                                                                                 Musculoskeletal/extremities: No deformity or scoliosis noted of  the thoracic or lumbar spine. No clubbing, cyanosis, edema, or deformity noted. Range of motion  normal .Tone & strength  normal.Joints normal. Nail health  good. He is able to lie flat and sit up without help. Gait is somewhat broad-based with some external rotation of his stay. He has some pes planus. Vascular: Carotid, radial artery, dorsalis pedis and  posterior tibial pulses are full and equal. No bruits present. Neurologic: Alert and oriented x3. Deep tendon reflexes symmetrical and normal.          Skin: Intact without suspicious lesions or rashes. Lymph: No cervical, axillary  lymphadenopathy present. Psych: Mood and affect are normal. Normally interactive                                                                                        Assessment & Plan:  #1 left thigh pain; no neuromuscular deficit  on exam  #2 sleep disorder; there are some hygiene issues but appears to be mainly related to stressors in his life  #3 irregular rhythm dated 2 PACs; no atrial fib present  Plan: See orders and recommendations

## 2011-01-10 ENCOUNTER — Other Ambulatory Visit: Payer: Self-pay | Admitting: Internal Medicine

## 2011-02-14 ENCOUNTER — Encounter: Payer: Self-pay | Admitting: Internal Medicine

## 2011-02-14 ENCOUNTER — Ambulatory Visit (INDEPENDENT_AMBULATORY_CARE_PROVIDER_SITE_OTHER): Payer: Medicare Other | Admitting: Internal Medicine

## 2011-02-14 ENCOUNTER — Other Ambulatory Visit: Payer: Self-pay | Admitting: Cardiology

## 2011-02-14 DIAGNOSIS — M25569 Pain in unspecified knee: Secondary | ICD-10-CM

## 2011-02-14 DIAGNOSIS — D649 Anemia, unspecified: Secondary | ICD-10-CM

## 2011-02-14 DIAGNOSIS — E785 Hyperlipidemia, unspecified: Secondary | ICD-10-CM

## 2011-02-14 DIAGNOSIS — M109 Gout, unspecified: Secondary | ICD-10-CM

## 2011-02-14 DIAGNOSIS — I2510 Atherosclerotic heart disease of native coronary artery without angina pectoris: Secondary | ICD-10-CM

## 2011-02-14 DIAGNOSIS — M25561 Pain in right knee: Secondary | ICD-10-CM

## 2011-02-14 DIAGNOSIS — M25562 Pain in left knee: Secondary | ICD-10-CM

## 2011-02-14 DIAGNOSIS — R7309 Other abnormal glucose: Secondary | ICD-10-CM

## 2011-02-14 DIAGNOSIS — Z23 Encounter for immunization: Secondary | ICD-10-CM

## 2011-02-14 DIAGNOSIS — M19049 Primary osteoarthritis, unspecified hand: Secondary | ICD-10-CM

## 2011-02-14 DIAGNOSIS — Z0000 Encounter for general adult medical examination without abnormal findings: Secondary | ICD-10-CM

## 2011-02-14 DIAGNOSIS — R7989 Other specified abnormal findings of blood chemistry: Secondary | ICD-10-CM

## 2011-02-14 DIAGNOSIS — Z8546 Personal history of malignant neoplasm of prostate: Secondary | ICD-10-CM

## 2011-02-14 LAB — CBC WITH DIFFERENTIAL/PLATELET
Basophils Absolute: 0.1 10*3/uL (ref 0.0–0.1)
Basophils Relative: 1 % (ref 0.0–3.0)
Eosinophils Absolute: 0.2 10*3/uL (ref 0.0–0.7)
Lymphocytes Relative: 22.1 % (ref 12.0–46.0)
MCHC: 34.2 g/dL (ref 30.0–36.0)
MCV: 94.8 fl (ref 78.0–100.0)
Monocytes Absolute: 0.5 10*3/uL (ref 0.1–1.0)
Neutrophils Relative %: 62.8 % (ref 43.0–77.0)
RBC: 4.95 Mil/uL (ref 4.22–5.81)
RDW: 13.1 % (ref 11.5–14.6)

## 2011-02-14 LAB — SEDIMENTATION RATE: Sed Rate: 20 mm/hr (ref 0–22)

## 2011-02-14 LAB — MICROALBUMIN / CREATININE URINE RATIO: Microalb, Ur: 1.3 mg/dL (ref 0.0–1.9)

## 2011-02-14 LAB — IBC PANEL
Saturation Ratios: 21.2 % (ref 20.0–50.0)
Transferrin: 320.2 mg/dL (ref 212.0–360.0)

## 2011-02-14 MED ORDER — ROSUVASTATIN CALCIUM 10 MG PO TABS: 10.0000 mg | ORAL_TABLET | Freq: Every day | ORAL | Status: DC

## 2011-02-14 NOTE — Telephone Encounter (Signed)
New msg: pt is almost out of medication.

## 2011-02-14 NOTE — Assessment & Plan Note (Deleted)
A1c will be checked

## 2011-02-14 NOTE — Patient Instructions (Signed)
Tylenol is safe as long as the labeling directions are followed.

## 2011-02-14 NOTE — Assessment & Plan Note (Signed)
A1c and urine microalbumin will be checked 

## 2011-02-14 NOTE — Assessment & Plan Note (Signed)
Fasting lipids will be rechecked

## 2011-02-14 NOTE — Progress Notes (Signed)
  Subjective:    Patient ID: Bryce Maldonado, male    DOB: 15-Dec-1940, 71 y.o.   MRN: [id number]  HPI Extremity pain Location: hands , knees Onset:knees since THR , < 1 year; hands 1-2 mos Trigger/injury:no Pain quality:stiffness. No FH of arthritis Pain severity:up to 3 Duration:constant , worse in am but improves with mobilization Radiation:no Exacerbating factors:after rest over night Treatment/response:Aleve w/o benefit ; it caused rash. Tylenol may be of benefit Review of systems: Constitutional: no fever, chills, sweats, change in weight  Musculoskeletal:no  muscle cramps or pain; no joint redness, or swelling Skin:no rash, color change Neuro: no weakness;  numbness and tingling Heme:no lymphadenopathy; abnormal bruising or bleeding   Past medical history: Gout , in toes & feet; he has had no attack for 20 years since his allopurinol. No known FH arthritis      Review of Systems he has been restricting hypoglycemiccarbs and high fructose  Corn syrup with  weight loss of > 20 #. He wishes that to have his chemistries rechecked. In May/2012 glucose was 191 triglycerides 224.  In February his hematocrit was 36.2. He denies abdominal pain, melena or rectal bleeding     Objective:   Physical Exam   He appears healthy and well-nourished in no distress  He has no lymphadenopathy about the head neck or axilla.  Heart:  Normal rate and regular rhythm. S1 and S2 normal without gallop, murmur, click, rub or other extra sounds.Lungs:Chest clear to auscultation; no wheezes, rhonchi,rales ,or rubs present.No increased work of breathing.   Skin and nail health is good  Range of motion is normal except at the hips. The knees appear normal with no signs of effusion. There is mild instability with flexion. There is no evidence of podagra. There is an osteophyte or a thickened ganglion cyst over the dorsum of the right PIP thumb joint.        Assessment & Plan:   #1 symmetric  arthralgias involving the knees and hands. The symmetry and the history of increased symptoms in the morning with improvement with mobilization suggest rheumatoid arthritis. The knees  are clinically normal; his pain may be related to gait changes following his total hip replacements.  #2 past medical history gout; on allopurinol therapy  #3 rash with Aleve  #4 anemia  Plan: Sedimentation rate, rheumatoid arthritis titer, uric acid.

## 2011-02-15 ENCOUNTER — Other Ambulatory Visit: Payer: Self-pay | Admitting: *Deleted

## 2011-02-16 ENCOUNTER — Other Ambulatory Visit: Payer: Self-pay | Admitting: Cardiology

## 2011-02-16 DIAGNOSIS — M109 Gout, unspecified: Secondary | ICD-10-CM

## 2011-02-16 DIAGNOSIS — Z0000 Encounter for general adult medical examination without abnormal findings: Secondary | ICD-10-CM

## 2011-02-16 DIAGNOSIS — I2510 Atherosclerotic heart disease of native coronary artery without angina pectoris: Secondary | ICD-10-CM

## 2011-02-16 DIAGNOSIS — Z8546 Personal history of malignant neoplasm of prostate: Secondary | ICD-10-CM

## 2011-02-16 DIAGNOSIS — E785 Hyperlipidemia, unspecified: Secondary | ICD-10-CM

## 2011-02-16 DIAGNOSIS — R7309 Other abnormal glucose: Secondary | ICD-10-CM

## 2011-02-16 DIAGNOSIS — Z23 Encounter for immunization: Secondary | ICD-10-CM

## 2011-02-16 NOTE — Telephone Encounter (Signed)
Refill   3rd Call- Patient called angry that he has to call back repeatidly to get his medication refilled.  Patient said he is down to his last 2 pills and need the prescription filled ASAP.  Please return a call to patient on hm#

## 2011-02-16 NOTE — Telephone Encounter (Signed)
Called patient and advised refill ready,spoke with pharmacist

## 2011-02-24 ENCOUNTER — Telehealth: Payer: Self-pay | Admitting: Internal Medicine

## 2011-02-24 NOTE — Telephone Encounter (Signed)
Patient is requesting lab results.

## 2011-02-24 NOTE — Telephone Encounter (Signed)
I called patient and informed him labs did not populate to MD, labs printed to be addressed. Once labs addressed I will inform patient of results, labs to be mailed also   Dr.Hopper please advise

## 2011-02-26 NOTE — Telephone Encounter (Signed)
Anemia which was present has resolved. All other labs are excellent.   To prevent gout the minimal uric acid goal is < 7; preferred is < 6, ideally < 5 .  The most common cause of elevated uric acid is the ingestion of sugar from high fructose corn syrup sources. You should consume less than 40 grams  (preferably ZERO) of sugar per day from foods and drinks with high fructose corn syrup as number 1,2, 3, or #4 on the label.  Sedimentation rate is a non specific test which is elevated with any inflammatory process ( Ex. Active Rheumatoid Arthritis; etc).   The rheumatoid factor is a screening test for rheumatoid arthritis; it is negative.  [employer]

## 2011-02-27 NOTE — Telephone Encounter (Signed)
Left message on voicemail with results, patient to call if questions or concerns. (copy of phone note printed and mailed to patient also)

## 2011-03-06 ENCOUNTER — Telehealth: Payer: Self-pay | Admitting: Cardiology

## 2011-03-06 NOTE — Telephone Encounter (Signed)
Pt calling re requesting blood work prior to appt, can get order?

## 2011-03-06 NOTE — Telephone Encounter (Signed)
Patient spoke with Patient's wife. Patient has an appointment with Dr. [place] tomorrow  03/07/11 at 11:30 AM he would like to have lab work done prior appointment. Wife is aware that on the last office visit note on 06/24/10 MD does not mention any labs to be drawn prior O/V. Patient's wife aware.

## 2011-03-07 ENCOUNTER — Encounter: Payer: Self-pay | Admitting: Cardiology

## 2011-03-07 ENCOUNTER — Ambulatory Visit (INDEPENDENT_AMBULATORY_CARE_PROVIDER_SITE_OTHER): Payer: Medicare Other | Admitting: Cardiology

## 2011-03-07 VITALS — BP 152/90 | HR 80 | Ht 72.0 in | Wt 210.4 lb

## 2011-03-07 DIAGNOSIS — I2510 Atherosclerotic heart disease of native coronary artery without angina pectoris: Secondary | ICD-10-CM

## 2011-03-07 DIAGNOSIS — E785 Hyperlipidemia, unspecified: Secondary | ICD-10-CM

## 2011-03-07 NOTE — Progress Notes (Signed)
Bryce Maldonado Date of Birth: 1941-01-05   History of Present Illness: Bryce Maldonado is seen for followup today.  He is doing well from a cardiac standpoint without any significant chest pain or shortness of breath. He has lost 18 pounds since his last visit on a [employer].  Current Outpatient Prescriptions on File Prior to Visit  Medication Sig Dispense Refill  . allopurinol (ZYLOPRIM) 300 MG tablet TAKE 1 TABLET DAILY (NO FURTHER REFILLS UNTIL OFFICE VISIT SCHEDULED)  90 tablet  1  . aspirin 81 MG tablet Take 81 mg by mouth daily.        [name] ezetimibe (ZETIA) 10 MG tablet Take 10 mg by mouth every evening.       . fexofenadine (ALLEGRA) 180 MG tablet Take 180 mg by mouth every morning.       . folic acid (FOLVITE) 1 MG tablet Take 1 mg by mouth daily.        [name] KRILL OIL PO Take by mouth daily.      . Multiple Vitamins-Minerals (CENTRUM PO) Take 1 tablet by mouth daily.       . Nutritional Supplements (MELATONIN PO) Take 1 each by mouth at bedtime.        . rosuvastatin (CRESTOR) 10 MG tablet Take 1 tablet (10 mg total) by mouth daily.  30 tablet  5    Allergies  Allergen Reactions  . Aleve     rash    Past Medical History  Diagnosis Date  . Spinal stenosis 2004    L4 radiculopathy, herniated nucleus pulposus L4-5  . Dyslipidemia   . Coronary artery disease     post CABG x4 -- in 1995 -- due to three-vessel coronary artery disease  . BPH (benign prostatic hyperplasia)     Elevated PSA  . Anemia   . Gout   . Diverticular disease   . Childhood asthma   . Osteoarthritis   . Tinnitus   . Prostate cancer     Past Surgical History  Procedure Date  . Salivary gland surgery 2001    [place]  . Appendectomy   . Coronary artery bypass graft 10/21/1993    x4 -- using the LIM artery graft to the LAD artery, with saphenous vein grafts to the diagonal branch of the LAD, OM branch the left circumflex coronary artery, and the posterior descending branch of the RCA -- Est. EF of 65%--  Surgeon: Alleen Borne, M.D.              . Colonoscopy 2003     Negative, Dr. Leone Payor  . Prostate biopsy 10/2008  . Total hip arthroplasty November 2010    right  . Total hip arthroplasty February 2012    left  . Microdiscectomy lumbar 2004    L4-5, Dr. Lovell Sheehan  . Flexible sigmoidoscopy 2005     external hemorrhoids  . Cardiac catheterization 02/10/2010    Est. EF of 60-65% -- Severe three-vessel obstructive atherosclerotic coronary artery disease -- All grafts were patent including left internal mammary artery graft to left anterior descending coronary artery, saphenous vein graft to the diagonal, saphenous vein graft to obtuse marginal vessel, and saphenous vein graft to the posterior descending coronary artery -- Normal left ventricular function   . Joint replacement     History  Smoking status  . Former Smoker -- 2.5 packs/day for 15 years  . Quit date: 01/30/1970  Smokeless tobacco  . Not on file    History  Alcohol Use  . Yes     < 2 / day    Family History  Problem Relation Age of Onset  . Diabetes Father   . Hypertension Father   . Lung cancer Father   . Heart failure Mother 40  . Hyperlipidemia Sister   . Coronary artery disease Sister   . Coronary artery disease Paternal Aunt     X 2    Review of Systems: The review of systems is positive for arthralgias.   Recent laboratory evaluation by Dr. Alwyn Ren was negative. Other systems are reviewed and are negative  Physical exam: BP 152/90  Pulse 80  Ht 6' (1.829 m)  Wt 210 lb 6.4 oz (95.437 kg)  BMI 28.54 kg/m2 He is a pleasant white male in no acute distress. His HEENT exam is unremarkable. He has no JVD or bruits. Lungs are clear. Cardiac exam reveals a regular rate and rhythm without gallop, murmur, or click. Abdomen is soft and nontender. He has no masses or bruits. Femoral and pedal pulses are good. He is alert and oriented x3. Cranial nerves II through XII are intact. LABORATORY DATA:   Assessment /  Plan:

## 2011-03-07 NOTE — Patient Instructions (Signed)
Continue your current medication.  I will see you again in 1 year.  

## 2011-03-07 NOTE — Assessment & Plan Note (Signed)
He will remain on Crestor and Zetia. He is scheduled for followup lab work with Dr. Alwyn Ren in May.

## 2011-03-07 NOTE — Assessment & Plan Note (Signed)
Cardiac catheterization in January 2012 showed all grafts were patent. He remains asymptomatic. We'll continue with his current medical therapy and risk factor modification.

## 2011-03-23 ENCOUNTER — Telehealth: Payer: Self-pay | Admitting: Internal Medicine

## 2011-03-23 DIAGNOSIS — T887XXA Unspecified adverse effect of drug or medicament, initial encounter: Secondary | ICD-10-CM

## 2011-03-23 DIAGNOSIS — E785 Hyperlipidemia, unspecified: Secondary | ICD-10-CM

## 2011-03-23 NOTE — Telephone Encounter (Signed)
Future orders placed 

## 2011-03-23 NOTE — Telephone Encounter (Signed)
Patient is requesting lab work for Cholesterol/Triglycerides checked. I have scheduled the patient for a lab appt. 03/29/11 @ 10:45am. Is that ok or do I need to do something different? Thanks

## 2011-03-24 ENCOUNTER — Encounter: Payer: Self-pay | Admitting: Internal Medicine

## 2011-03-24 ENCOUNTER — Ambulatory Visit (INDEPENDENT_AMBULATORY_CARE_PROVIDER_SITE_OTHER): Payer: Medicare Other | Admitting: Internal Medicine

## 2011-03-24 VITALS — BP 122/84 | HR 75 | Temp 98.7°F | Wt 210.8 lb

## 2011-03-24 DIAGNOSIS — F419 Anxiety disorder, unspecified: Secondary | ICD-10-CM

## 2011-03-24 DIAGNOSIS — J069 Acute upper respiratory infection, unspecified: Secondary | ICD-10-CM

## 2011-03-24 DIAGNOSIS — F411 Generalized anxiety disorder: Secondary | ICD-10-CM

## 2011-03-24 DIAGNOSIS — R209 Unspecified disturbances of skin sensation: Secondary | ICD-10-CM

## 2011-03-24 DIAGNOSIS — R202 Paresthesia of skin: Secondary | ICD-10-CM

## 2011-03-24 DIAGNOSIS — M2560 Stiffness of unspecified joint, not elsewhere classified: Secondary | ICD-10-CM

## 2011-03-24 MED ORDER — CLONAZEPAM 0.5 MG PO TABS: 0.5000 mg | ORAL_TABLET | Freq: Two times a day (BID) | ORAL | Status: DC | PRN

## 2011-03-24 NOTE — Progress Notes (Signed)
Subjective:    Patient ID: LAWYER WASHABAUGH, male    DOB: [birth year], 71 y.o.   MRN: [id number]  HPI  #1 He describes   "creeping" sensation from his knees down shins; hands up to his elbows; and in both cheek areas intermittently since December, especially after lying in bed watching TV. If he sits in the chair and watches TV it will not involve the face or the arms He also has had an isolated instance on his thorax, today. He is not describing pain, redness, swelling in the joints;but  he has some stiffness in various joints. When he was seen 02/14/11  RA factor was less than 10 and sedimentation rate was 20. He was in [place] the first week in December and was bitten by mosquitoes. He raised the question of possible [place] Nile virus infection. He also questions a relationship to his prior hip prosthesis. They have not been contacted by a [place] about any adverse effects from that particular prosthesis. Also his symptoms started after he stopped taking clonazepam which he had been taking for sleeping problem. His wife states she is concerned that issues might be related to anxiety as he has had significant medical issues in the recent medical issues in the past.  #2 For  the last 5 days he states he's had some frontal headaches and scant nasal discharge which is purulent. He denies fever, chills, or sweats. Alka-Seltzer over-the-counter has helped the drainage significantly.   Review of Systems he has had a 26 pound weight loss since 7/12 and questions relationship to Zetia and statin. He has an appointment this week to have this checked in     Objective:   Physical Exam Gen.:  well-nourished in appearance. Somewhat anxious but cooperative throughout exam. Head: Normocephalic without obvious abnormalities  Eyes: No corneal or conjunctival inflammation noted. Neck: No deformities, masses, or tenderness noted. Range of motion normal; neck supple. Thyroid normal Lungs: Normal respiratory effort;  chest expands symmetrically. Lungs are clear to auscultation without rales, wheezes, or increased work of breathing. Heart: Normal rate and rhythm. Normal S1 and S2. No gallop, click, or rub.No murmur. Abdomen: Bowel sounds normal; abdomen soft and nontender. No masses, organomegaly or hernias noted.                                                        Musculoskeletal/extremities: No deformity or scoliosis noted of  the thoracic or lumbar spine. No clubbing, cyanosis, edema, or deformity noted. Range of motion  normal .Tone & strength  normal.Joints normal. Nail health  good. Straight leg raising is negative. No meningeal signs are present Vascular: Carotid, radial artery, dorsalis pedis and  posterior tibial pulses are full and equal. No bruits present. Neurologic: Alert and oriented x3. Deep tendon reflexes symmetrical and normal. Light touch is normal extremities   Skin: Intact without suspicious lesions or rashes. Lymph: No cervical, axillary lymphadenopathy present. Psych: Mood and affect as noted           Assessment & Plan:  #1 #1  #1 paresthesias versus formication; CBC and differential and iron levels were normal 02/14/11. Since symptoms began after stopping clonazepam; it will be reinitiated pending followup labs. There may be some  positionality  to his symptoms but is difficult to discern. If symptoms persist neurology evaluation we  proceeded  #2 upper respiratory tract infection resolving; rhinosinusitis is not suggested  #3 dyslipidemia; followup labs indicated. Because of #1 CK will also be checked.

## 2011-03-24 NOTE — Patient Instructions (Addendum)
Please  schedule fasting Labs : BMET,Lipids, hepatic panel, B12 level, CK. PLEASE BRING THESE INSTRUCTIONS TO FOLLOW UP  LAB APPOINTMENT.This will guarantee correct labs are drawn, eliminating need for repeat blood sampling ( needle sticks ! ). Diagnoses /Codes: paresthesias;272.4;995.20. Plain Mucinex for thick secretions ;force NON dairy fluids . Use a Neti pot daily as needed for sinus congestion .Zicam Melts or Zinc lozenges ; vitamin C 2000 mg daily; & Echinacea for 4-7 days. Report fever, exudate("pus") or progressive pain.

## 2011-03-29 ENCOUNTER — Other Ambulatory Visit (INDEPENDENT_AMBULATORY_CARE_PROVIDER_SITE_OTHER): Payer: Medicare Other

## 2011-03-29 DIAGNOSIS — T887XXA Unspecified adverse effect of drug or medicament, initial encounter: Secondary | ICD-10-CM

## 2011-03-29 DIAGNOSIS — E785 Hyperlipidemia, unspecified: Secondary | ICD-10-CM

## 2011-03-29 DIAGNOSIS — R202 Paresthesia of skin: Secondary | ICD-10-CM

## 2011-03-29 DIAGNOSIS — R209 Unspecified disturbances of skin sensation: Secondary | ICD-10-CM

## 2011-03-29 LAB — HEPATIC FUNCTION PANEL
ALT: 23 U/L (ref 0–53)
Bilirubin, Direct: 0 mg/dL (ref 0.0–0.3)
Total Bilirubin: 0.4 mg/dL (ref 0.3–1.2)

## 2011-03-29 LAB — LIPID PANEL
Total CHOL/HDL Ratio: 2
VLDL: 19.8 mg/dL (ref 0.0–40.0)

## 2011-03-29 LAB — CK: Total CK: 53 U/L (ref 7–232)

## 2011-03-29 LAB — VITAMIN B12: Vitamin B-12: 422 pg/mL (ref 211–911)

## 2011-04-19 ENCOUNTER — Encounter: Payer: Self-pay | Admitting: Internal Medicine

## 2011-04-19 ENCOUNTER — Ambulatory Visit (INDEPENDENT_AMBULATORY_CARE_PROVIDER_SITE_OTHER): Payer: Medicare Other | Admitting: Internal Medicine

## 2011-04-19 VITALS — BP 140/84 | HR 101 | Wt 208.0 lb

## 2011-04-19 DIAGNOSIS — R209 Unspecified disturbances of skin sensation: Secondary | ICD-10-CM

## 2011-04-19 DIAGNOSIS — R202 Paresthesia of skin: Secondary | ICD-10-CM

## 2011-04-19 DIAGNOSIS — F419 Anxiety disorder, unspecified: Secondary | ICD-10-CM

## 2011-04-19 DIAGNOSIS — F411 Generalized anxiety disorder: Secondary | ICD-10-CM

## 2011-04-19 DIAGNOSIS — G5600 Carpal tunnel syndrome, unspecified upper limb: Secondary | ICD-10-CM

## 2011-04-19 DIAGNOSIS — M1710 Unilateral primary osteoarthritis, unspecified knee: Secondary | ICD-10-CM

## 2011-04-19 LAB — T4, FREE: Free T4: 0.97 ng/dL (ref 0.60–1.60)

## 2011-04-19 LAB — TSH: TSH: 1.06 u[IU]/mL (ref 0.35–5.50)

## 2011-04-19 MED ORDER — VENLAFAXINE HCL ER 75 MG PO CP24: 75.0000 mg | ORAL_CAPSULE | Freq: Every day | ORAL | Status: DC

## 2011-04-19 NOTE — Patient Instructions (Signed)
Go to Web M.D. for information on Carpal Tunnel Syndrome. If symptoms persist or progress; nerve conduction/EMG studies would be indicated. If these were abnormal, Hand Surgery referral would be indicated.  Consider glucosamine sulfate 1500 mg daily for joint symptoms. Take this daily  for 3 months and then leave it off for 2 months. This will rehydrate the cartilages.

## 2011-04-19 NOTE — Progress Notes (Signed)
Subjective:    Patient ID: Bryce Maldonado, male    DOB: Apr 10, 1940, 71 y.o.   MRN: [id number]  HPI He states that in reviewing his last office visit there are no errors in the history and has been no significant progression of symptoms. He still frustrated in that he does not have a diagnosis and mentions the possibility of ALS in reference to the extremity paresthesias.  That office visit note was reviewed. There is a reference to "[place]" the word should have been "Orthopedist". Labs were also reviewed and copy provided  His sleep pattern has improved after restarting the clonazepam. He continues to have some anxiety and is inquiring about generic Effexor which has been effective for a friend.  His last TSH was 0.82 on 06/24/10     Review of Systems Extremity stiffness Location:both knees Onset:since THR due to "unequal lef length" Pain quality:only occasionally walking Radiation:into shins Exacerbating factors:while walking Treatment/response:Aleve helps (see Allergies) Review of systems: Constitutional: no fever, chills, sweats, significant change in weight  Musculoskeletal:calf  muscle cramps w/o pain; no  joint  Redness  or swelling Skin:no rash, color change Neuro: no weakness; incontinence (stool/urine) . He now only has numbness  in his hands when sleeping without  tingling. The paresthesias he was noted in the forearms and lower legs have resolved  Heme:no lymphadenopathy; abnormal bruising or bleeding        Objective:   Physical Exam Gen.: Healthy and well-nourished in appearance. Alert, appropriate and cooperative throughout exam.   Eyes: No corneal or conjunctival inflammation noted.  Extraocular motion intact; slight lateral deviation OD with superior gaze. Field of Vision grossly normal.Globes prominent w/o lid lag Mouth: Oral mucosa and oropharynx reveal no lesions or exudates. No tongue deviation or fasiculations. Neck: No deformities, masses, or tenderness  noted. Range of motion & Thyroid normal Lungs: Normal respiratory effort; chest expands symmetrically. Lungs are clear to auscultation without rales, wheezes, or increased work of breathing. Heart: Normal rate and rhythm. Normal S1 and S2. No gallop, click, or rub. Grade1/2 / 6 systolic  murmur. Abdomen: Bowel sounds normal; abdomen soft and nontender. No masses, organomegaly or hernias noted. Musculoskeletal/extremities: No deformity or scoliosis noted of  the thoracic or lumbar spine. No clubbing, cyanosis, edema, or deformity noted. Range of motion  normal .Tone & strength  normal.Joints normal. Nail health  Good. Negative SLR; he is able to lay flat & sit up without help Vascular: Carotid, radial artery, dorsalis pedis and  posterior tibial pulses are full and equal. No bruits present. Neurologic: Alert and oriented x3. Deep tendon reflexes symmetrical and normal. Gait including tiptoe and heel walking normal. Equivocal Tinel's sign right wrist     Skin: Intact without suspicious lesions or rashes. Lymph: No cervical, axillary lymphadenopathy present. Psych: Mood and affect are normal. Normally interactive                                                                                         Assessment & Plan:   #1 atypical paresthesias essentially resolved. He has residual symptoms suggestive of possible right carpal tunnel syndrome. If this is persistent or progressive;  wrist splint at night as recommended  #2 joint stiffness in the knees; exam is unremarkable. As noted RA factor and sedimentation rate testing were normal. A trial of glucosamine is recommended.  #3 anxiety; trial of generic Effexor.

## 2011-04-21 ENCOUNTER — Telehealth: Payer: Self-pay

## 2011-04-21 NOTE — Telephone Encounter (Signed)
Message copied by Maurice Small on Fri Apr 21, 2011  2:45 PM ------      Message from: Pecola Lawless      Created: Thu Apr 20, 2011  6:27 PM       Both thyroid function  labs are excellent.[employer]

## 2011-04-21 NOTE — Telephone Encounter (Signed)
Patient and patient's wife is aware labss normal. Patient had no questions at the time of call

## 2011-05-16 ENCOUNTER — Telehealth: Payer: Self-pay | Admitting: Internal Medicine

## 2011-05-16 DIAGNOSIS — Z0000 Encounter for general adult medical examination without abnormal findings: Secondary | ICD-10-CM

## 2011-05-16 DIAGNOSIS — E785 Hyperlipidemia, unspecified: Secondary | ICD-10-CM

## 2011-05-16 DIAGNOSIS — R7309 Other abnormal glucose: Secondary | ICD-10-CM

## 2011-05-16 DIAGNOSIS — I2510 Atherosclerotic heart disease of native coronary artery without angina pectoris: Secondary | ICD-10-CM

## 2011-05-16 DIAGNOSIS — F419 Anxiety disorder, unspecified: Secondary | ICD-10-CM

## 2011-05-16 DIAGNOSIS — Z23 Encounter for immunization: Secondary | ICD-10-CM

## 2011-05-16 DIAGNOSIS — Z8546 Personal history of malignant neoplasm of prostate: Secondary | ICD-10-CM

## 2011-05-16 DIAGNOSIS — M109 Gout, unspecified: Secondary | ICD-10-CM

## 2011-05-16 MED ORDER — CLONAZEPAM 0.5 MG PO TABS: 0.5000 mg | ORAL_TABLET | Freq: Two times a day (BID) | ORAL | Status: DC | PRN

## 2011-05-16 NOTE — Telephone Encounter (Signed)
Refill: Clonazepam .5 mg tab

## 2011-06-15 ENCOUNTER — Other Ambulatory Visit: Payer: Self-pay | Admitting: Cardiology

## 2011-06-17 ENCOUNTER — Other Ambulatory Visit: Payer: Self-pay | Admitting: Internal Medicine

## 2011-07-10 ENCOUNTER — Telehealth: Payer: Self-pay | Admitting: Internal Medicine

## 2011-07-10 DIAGNOSIS — F419 Anxiety disorder, unspecified: Secondary | ICD-10-CM

## 2011-07-10 MED ORDER — VENLAFAXINE HCL ER 75 MG PO CP24: 75.0000 mg | ORAL_CAPSULE | Freq: Every day | ORAL | Status: DC

## 2011-07-10 MED ORDER — ALLOPURINOL 300 MG PO TABS: 300.0000 mg | ORAL_TABLET | Freq: Every day | ORAL | Status: DC

## 2011-07-10 NOTE — Telephone Encounter (Signed)
Refill medications until September; and followup at that time .His last physical was May/12

## 2011-07-10 NOTE — Telephone Encounter (Signed)
Advised pt he needs to follow up in September and meds can be filled until that time.  Pt needs allopurinol and venlafaxine.  Allopurinol sent to [facility].  Venlafaxine sent to [facility].  Pt will call [facility] office tomorrow to schedule.

## 2011-07-10 NOTE — Telephone Encounter (Signed)
Pt wants to know if he really needs a physical exam this year. He states he has recently has several labs done recently and has been in to see Dr. Alwyn Ren several times in the past months. He is concerned about getting his meds refilled.  I can call pt back to schedule him for CPE if necessary.

## 2011-07-12 ENCOUNTER — Encounter: Payer: Self-pay | Admitting: Internal Medicine

## 2011-08-09 ENCOUNTER — Other Ambulatory Visit: Payer: Self-pay | Admitting: Cardiology

## 2011-10-10 ENCOUNTER — Ambulatory Visit (INDEPENDENT_AMBULATORY_CARE_PROVIDER_SITE_OTHER): Payer: Medicare Other | Admitting: Internal Medicine

## 2011-10-10 ENCOUNTER — Encounter: Payer: Self-pay | Admitting: Internal Medicine

## 2011-10-10 VITALS — BP 126/82 | HR 100 | Wt 215.0 lb

## 2011-10-10 DIAGNOSIS — M109 Gout, unspecified: Secondary | ICD-10-CM

## 2011-10-10 DIAGNOSIS — F411 Generalized anxiety disorder: Secondary | ICD-10-CM

## 2011-10-10 DIAGNOSIS — R7309 Other abnormal glucose: Secondary | ICD-10-CM

## 2011-10-10 DIAGNOSIS — F419 Anxiety disorder, unspecified: Secondary | ICD-10-CM

## 2011-10-10 LAB — MICROALBUMIN / CREATININE URINE RATIO
Creatinine,U: 137.7 mg/dL
Microalb, Ur: 0.7 mg/dL (ref 0.0–1.9)

## 2011-10-10 LAB — URIC ACID: Uric Acid, Serum: 5 mg/dL (ref 4.0–7.8)

## 2011-10-10 MED ORDER — VENLAFAXINE HCL ER 75 MG PO CP24: 75.0000 mg | ORAL_CAPSULE | Freq: Every day | ORAL | Status: DC

## 2011-10-10 NOTE — Progress Notes (Signed)
  Subjective:    Patient ID: Bryce Maldonado, male    DOB: May 18, 1940, 71 y.o.   MRN: [id number]  HPI He continues to have pain up to a level 2 in his knees and fingers with morning stiffness. After exercises this resolves. The joint symptoms do not limit activities of daily living. He is not had any gout symptoms since 1990 after allopurinol was initiated.  He saw his cardiologist; no changes were made. Lipids 2/13 were incredibly good.  His urologist has decreased frequency of observation to every 12 months based on the last exam and PSA.     Review of Systems He exercises at the gym 3 days a week with machine weights, elliptical machine and treadmill. He also walks 4-5 days a week for 45 minutes. He denies chest pain, dyspnea, palpitations, edema, or claudication. He's had no redness or swelling of any joints.     Objective:   Physical Exam Gen.: Healthy and well-nourished in appearance. Alert, appropriate and cooperative throughout exam. Neck: No deformities, masses, or tenderness noted. Range of motion & Thyroid normal. Lungs: Normal respiratory effort; chest expands symmetrically. Lungs are clear to auscultation without rales, wheezes, or increased work of breathing. Heart: Normal rate and rhythm. Normal S1 and S2. No gallop, click, or rub. S4 w/o murmur. Abdomen: Bowel sounds normal; abdomen soft and nontender. No masses, organomegaly or hernias noted.                                                                                  Musculoskeletal/extremities: No deformity or scoliosis noted of  the thoracic or lumbar spine. No clubbing, cyanosis, edema, or deformity noted. Range of motion  normal .Tone & strength  normal.Joints normal. Nail health  Good.Minor crepitus of knees Vascular: Carotid, radial artery, dorsalis pedis and  posterior tibial pulses are full and equal. No bruits present. Neurologic: Alert and oriented x3. Deep tendon reflexes symmetrical and normal.  Light touch  normal over feet.         Skin: Intact without suspicious lesions or rashes.  Psych: Mood and affect are normal. Normally interactive                                                                                         Assessment & Plan:

## 2011-10-10 NOTE — Assessment & Plan Note (Signed)
A1c and urine microalbumin will be checked. Annual ophthalmologic exam recommended

## 2011-10-10 NOTE — Assessment & Plan Note (Signed)
He has mild arthralgias with no significant joint symptoms changes except for minor crepitus of the knees. By history and exam gout is not suggested. Uric acid will be checked to see if the allopurinol dose can be decreased

## 2011-10-10 NOTE — Patient Instructions (Addendum)
Review and correct the record as indicated. Please share record with all medical staff seen.   If you activate My Chart; the results can be released to you as soon as they populate from the lab. If you choose not to use this program; the labs have to be reviewed, copied & mailed   causing a delay in getting the results to you.  

## 2011-11-24 IMAGING — CR DG HIP 1V PORT*L*
1 series · 1 of 1 positions shown · non-contrast
Comparison: None

CLINICAL DATA: Total left hip arthroplasty.

PORTABLE LEFT HIP - 1 VIEW

[view not recorded]
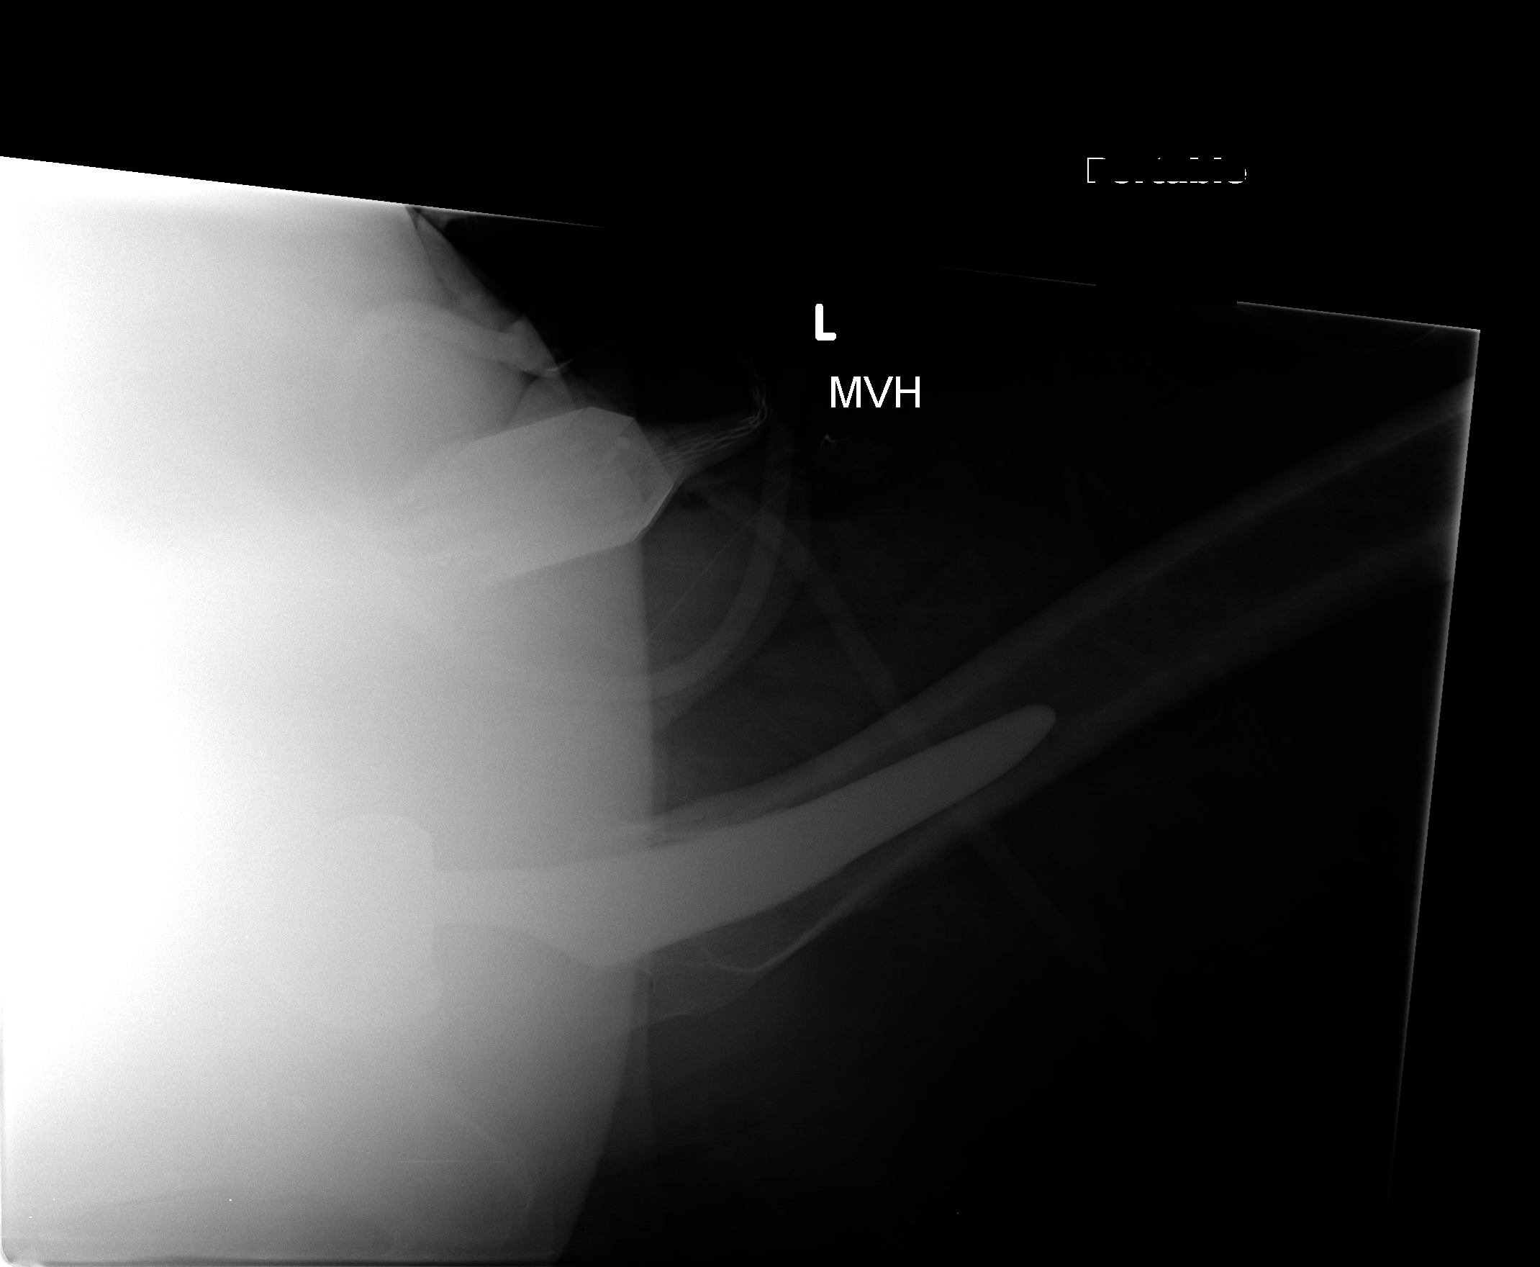

[1 of 1 positions shown; findings below may reference images not displayed]

FINDINGS: Lateral film demonstrates well seated components of a
total left hip arthroplasty.  No complicating features.
IMPRESSION: Well seated components of a total left hip arthroplasty without
complicating features.

## 2012-02-09 ENCOUNTER — Other Ambulatory Visit: Payer: Self-pay | Admitting: *Deleted

## 2012-02-09 MED ORDER — ROSUVASTATIN CALCIUM 10 MG PO TABS: 10.0000 mg | ORAL_TABLET | Freq: Every day | ORAL | Status: DC

## 2012-02-21 ENCOUNTER — Other Ambulatory Visit: Payer: Self-pay | Admitting: Internal Medicine

## 2012-03-19 ENCOUNTER — Other Ambulatory Visit: Payer: Self-pay

## 2012-03-19 ENCOUNTER — Telehealth: Payer: Self-pay | Admitting: Internal Medicine

## 2012-03-19 DIAGNOSIS — F419 Anxiety disorder, unspecified: Secondary | ICD-10-CM

## 2012-03-19 MED ORDER — ROSUVASTATIN CALCIUM 10 MG PO TABS: 10.0000 mg | ORAL_TABLET | Freq: Every day | ORAL | Status: DC

## 2012-03-19 MED ORDER — VENLAFAXINE HCL ER 75 MG PO CP24: 75.0000 mg | ORAL_CAPSULE | Freq: Every day | ORAL | Status: DC

## 2012-03-19 MED ORDER — ALLOPURINOL 300 MG PO TABS: ORAL_TABLET | ORAL | Status: DC

## 2012-03-19 NOTE — Telephone Encounter (Signed)
RXs sent.

## 2012-03-19 NOTE — Telephone Encounter (Signed)
Refills x 2 -- venlafaxine & Allopurinol--no strength, qty, directions or last fill dates listed  Last wrt as:  1-venlafaxine XR (EFFEXOR XR) 75 MG 24 hr capsule #90  10/10/2011  Take 1 capsule (75 mg total) by mouth daily. - Oral  2-allopurinol (ZYLOPRIM) 300 MG tablet #90  02/21/2012 : TAKE 1 TABLET DAILY

## 2012-04-16 ENCOUNTER — Telehealth: Payer: Self-pay | Admitting: Cardiology

## 2012-04-16 MED ORDER — EZETIMIBE 10 MG PO TABS: 10.0000 mg | ORAL_TABLET | Freq: Every day | ORAL | Status: DC

## 2012-04-16 NOTE — Telephone Encounter (Signed)
Refill for zetia (no refills) sent to [facility] with a note that pt is due for an appt.

## 2012-04-16 NOTE — Telephone Encounter (Signed)
[facility] requested refill for zetia and crestor, he got the crestor but not zetia, almost out needs refill asap

## 2012-05-07 ENCOUNTER — Encounter: Payer: Self-pay | Admitting: Internal Medicine

## 2012-05-08 ENCOUNTER — Encounter: Payer: Self-pay | Admitting: Internal Medicine

## 2012-05-22 ENCOUNTER — Other Ambulatory Visit: Payer: Self-pay | Admitting: Cardiology

## 2012-06-13 ENCOUNTER — Encounter: Payer: Self-pay | Admitting: Internal Medicine

## 2012-06-13 ENCOUNTER — Ambulatory Visit (AMBULATORY_SURGERY_CENTER): Payer: Medicare PPO | Admitting: *Deleted

## 2012-06-13 VITALS — Ht 72.0 in | Wt 230.0 lb

## 2012-06-13 DIAGNOSIS — Z1211 Encounter for screening for malignant neoplasm of colon: Secondary | ICD-10-CM

## 2012-06-13 MED ORDER — NA SULFATE-K SULFATE-MG SULF 17.5-3.13-1.6 GM/177ML PO SOLN: ORAL | Status: DC

## 2012-06-25 ENCOUNTER — Ambulatory Visit (AMBULATORY_SURGERY_CENTER): Payer: Medicare PPO | Admitting: Internal Medicine

## 2012-06-25 ENCOUNTER — Encounter: Payer: Self-pay | Admitting: Internal Medicine

## 2012-06-25 VITALS — BP 132/75 | HR 63 | Temp 97.6°F | Resp 13 | Ht 72.0 in | Wt 230.0 lb

## 2012-06-25 DIAGNOSIS — Z1211 Encounter for screening for malignant neoplasm of colon: Secondary | ICD-10-CM

## 2012-06-25 DIAGNOSIS — K648 Other hemorrhoids: Secondary | ICD-10-CM

## 2012-06-25 MED ORDER — SODIUM CHLORIDE 0.9 % IV SOLN: 500.0000 mL | INTRAVENOUS | Status: DC

## 2012-06-25 NOTE — Progress Notes (Signed)
Patient did not experience any of the following events: a burn prior to discharge; a fall within the facility; wrong site/side/patient/procedure/implant event; or a hospital transfer or hospital admission upon discharge from the facility. (G8907) Patient did not have preoperative order for IV antibiotic SSI prophylaxis. (G8918)  

## 2012-06-25 NOTE — Progress Notes (Signed)
NO EGG OR SOY ALLERGY. EWM 

## 2012-06-25 NOTE — Patient Instructions (Addendum)
You have some internal hemorrhoids but no polyps were seen.  You may consider another routine colonoscopy in 10 years (age 72) but i will let you and your primary care provider decide about that then (no routine recall planned).  I appreciate the opportunity to care for you.  Iva Boop, MD, FACG   YOU HAD AN ENDOSCOPIC PROCEDURE TODAY AT THE [facility]: Refer to the procedure report that was given to you for any specific questions about what was found during the examination.  If the procedure report does not answer your questions, please call your gastroenterologist to clarify.  If you requested that your care partner not be given the details of your procedure findings, then the procedure report has been included in a sealed envelope for you to review at your convenience later.  YOU SHOULD EXPECT: Some feelings of bloating in the abdomen. Passage of more gas than usual.  Walking can help get rid of the air that was put into your GI tract during the procedure and reduce the bloating. If you had a lower endoscopy (such as a colonoscopy or flexible sigmoidoscopy) you may notice spotting of blood in your stool or on the toilet paper. If you underwent a bowel prep for your procedure, then you may not have a normal bowel movement for a few days.  DIET: Your first meal following the procedure should be a light meal and then it is ok to progress to your normal diet.  A half-sandwich or bowl of soup is an example of a good first meal.  Heavy or fried foods are harder to digest and may make you feel nauseous or bloated.  Likewise meals heavy in dairy and vegetables can cause extra gas to form and this can also increase the bloating.  Drink plenty of fluids but you should avoid alcoholic beverages for 24 hours.  ACTIVITY: Your care partner should take you home directly after the procedure.  You should plan to take it easy, moving slowly for the rest of the day.  You can resume normal activity  the day after the procedure however you should NOT DRIVE or use heavy machinery for 24 hours (because of the sedation medicines used during the test).    SYMPTOMS TO REPORT IMMEDIATELY: A gastroenterologist can be reached at any hour.  During normal business hours, 8:30 AM to 5:00 PM Monday through Friday, call [phone].  After hours and on weekends, please call the GI answering service at [phone] who will take a message and have the physician on call contact you.   Following lower endoscopy (colonoscopy or flexible sigmoidoscopy):  Excessive amounts of blood in the stool  Significant tenderness or worsening of abdominal pains  Swelling of the abdomen that is new, acute  Fever of 100F or higher   FOLLOW UP: If any biopsies were taken you will be contacted by phone or by letter within the next 1-3 weeks.  Call your gastroenterologist if you have not heard about the biopsies in 3 weeks.  Our staff will call the home number listed on your records the next business day following your procedure to check on you and address any questions or concerns that you may have at that time regarding the information given to you following your procedure. This is a courtesy call and so if there is no answer at the home number and we have not heard from you through the emergency physician on call, we will assume that you have  returned to your regular daily activities without incident.  SIGNATURES/CONFIDENTIALITY: You and/or your care partner have signed paperwork which will be entered into your electronic medical record.  These signatures attest to the fact that that the information above on your After Visit Summary has been reviewed and is understood.  Full responsibility of the confidentiality of this discharge information lies with you and/or your care-partner.    Hemorrhoid information given.

## 2012-06-25 NOTE — Op Note (Signed)
[facility] [address] [place], [address]   COLONOSCOPY PROCEDURE REPORT  PATIENT: Bryce Maldonado, Bryce Maldonado  MR#: [id number] BIRTHDATE: Jun 05, 1940 , 71  yrs. old GENDER: Male ENDOSCOPIST: Iva Boop, MD, [facility] PROCEDURE DATE:  06/25/2012 PROCEDURE:   Colonoscopy, screening ASA CLASS:   Class II INDICATIONS:Last colonoscopy performed 10 years ago. MEDICATIONS: propofol (Diprivan) 200mg  IV, MAC sedation, administered by CRNA, and These medications were titrated to patient response per physician's verbal order  DESCRIPTION OF PROCEDURE:   After the risks benefits and alternatives of the procedure were thoroughly explained, informed consent was obtained.  A digital rectal exam revealed no abnormalities of the rectum, A digital rectal exam revealed the prostate was not enlarged, and A digital rectal exam revealed no prostatic nodules.   The LB PFC-H190 [id number]  endoscope was introduced through the anus and advanced to the cecum, which was identified by both the appendix and ileocecal valve. No adverse events experienced.   The quality of the prep was excellent using Suprep  The instrument was then slowly withdrawn as the colon was fully examined.      COLON FINDINGS: A normal appearing cecum, ileocecal valve, and appendiceal orifice were identified.  The ascending, hepatic flexure, transverse, splenic flexure, descending, sigmoid colon and rectum appeared unremarkable.  No polyps or cancers were seen.   A right colon retroflexion was performed.  Retroflexed rectal views revealed internal hemorrhoids. The time to cecum=2 minutes 10 seconds.  Withdrawal time=9 minutes 38 seconds.  The scope was withdrawn and the procedure completed. COMPLICATIONS: There were no complications.  ENDOSCOPIC IMPRESSION: 1.   Normal colon 2.   Internal hemorrhoids in the rectum  RECOMMENDATIONS: Follow-up GI as needed - next routine repeat colonoscopy would be 2024 - age 77 -  can determine with PCP then   eSigned:  Iva Boop, MD, [facility] 06/25/2012 10:17 AM   cc: Pecola Lawless, MD and The Patient

## 2012-06-26 ENCOUNTER — Other Ambulatory Visit: Payer: Self-pay | Admitting: Physician Assistant

## 2012-06-26 ENCOUNTER — Telehealth: Payer: Self-pay

## 2012-06-26 NOTE — Telephone Encounter (Signed)
  Follow up Call-  Call back number 06/25/2012  Post procedure Call Back phone  # [phone]  Permission to leave phone message Yes     Patient questions:  Do you have a fever, pain , or abdominal swelling? no Pain Score  0 *  Have you tolerated food without any problems? yes  Have you been able to return to your normal activities? yes  Do you have any questions about your discharge instructions: Diet   no Medications  no Follow up visit  no  Do you have questions or concerns about your Care? no  Actions: * If pain score is 4 or above: No action needed, pain <4.  The pt was still asleep per his wife.  She said he did great. Maw

## 2012-07-26 ENCOUNTER — Ambulatory Visit (INDEPENDENT_AMBULATORY_CARE_PROVIDER_SITE_OTHER): Payer: Medicare PPO | Admitting: Internal Medicine

## 2012-07-26 ENCOUNTER — Encounter: Payer: Self-pay | Admitting: Internal Medicine

## 2012-07-26 VITALS — BP 142/86 | HR 76 | Temp 98.2°F | Ht 70.75 in | Wt 226.0 lb

## 2012-07-26 DIAGNOSIS — R9431 Abnormal electrocardiogram [ECG] [EKG]: Secondary | ICD-10-CM

## 2012-07-26 DIAGNOSIS — Z0000 Encounter for general adult medical examination without abnormal findings: Secondary | ICD-10-CM

## 2012-07-26 DIAGNOSIS — E785 Hyperlipidemia, unspecified: Secondary | ICD-10-CM

## 2012-07-26 DIAGNOSIS — M109 Gout, unspecified: Secondary | ICD-10-CM

## 2012-07-26 DIAGNOSIS — Z8546 Personal history of malignant neoplasm of prostate: Secondary | ICD-10-CM

## 2012-07-26 DIAGNOSIS — I2510 Atherosclerotic heart disease of native coronary artery without angina pectoris: Secondary | ICD-10-CM

## 2012-07-26 LAB — LIPID PANEL
LDL Cholesterol: 79 mg/dL (ref 0–99)
VLDL: 28.6 mg/dL (ref 0.0–40.0)

## 2012-07-26 LAB — TSH: TSH: 1.09 u[IU]/mL (ref 0.35–5.50)

## 2012-07-26 LAB — HEPATIC FUNCTION PANEL
AST: 19 U/L (ref 0–37)
Alkaline Phosphatase: 54 U/L (ref 39–117)
Total Bilirubin: 0.4 mg/dL (ref 0.3–1.2)

## 2012-07-26 LAB — CBC WITH DIFFERENTIAL/PLATELET
Basophils Absolute: 0 10*3/uL (ref 0.0–0.1)
Lymphocytes Relative: 22.3 % (ref 12.0–46.0)
Monocytes Relative: 10.6 % (ref 3.0–12.0)
Platelets: 279 10*3/uL (ref 150.0–400.0)
RDW: 12.7 % (ref 11.5–14.6)

## 2012-07-26 LAB — BASIC METABOLIC PANEL
BUN: 17 mg/dL (ref 6–23)
Calcium: 9.7 mg/dL (ref 8.4–10.5)
GFR: 104.35 mL/min (ref >60.00)
Glucose, Bld: 189 mg/dL — ABNORMAL HIGH (ref 70–99)
Sodium: 137 mEq/L (ref 135–145)

## 2012-07-26 LAB — URIC ACID: Uric Acid, Serum: 5.8 mg/dL (ref 4.0–7.8)

## 2012-07-26 NOTE — Patient Instructions (Addendum)

## 2012-07-26 NOTE — Progress Notes (Signed)
Subjective:    Patient ID: Bryce Maldonado, male    DOB: [birth year], 72 y.o.   MRN: [id number]  HPI Medicare Wellness Visit:  Psychosocial & medical history were reviewed as required by Medicare (abuse,antisocial behavioral risks,firearm risk).  Social history: caffeine:2 cups coffee/day  , alcohol:  7 drinks / week ,  tobacco use: quit 1972   Exercise :  See below Home & personal  safety / fall risk:no Limitations of activities of daily living:no Seatbelt  and smoke alarm use:yes Power of Attorney/Living Will status : in place Ophthalmology exam status :current Hearing evaluation status:not current Orientation :oriented X 3 Memory & recall :good Math testing:good Active depression / anxiety:on Effexor with benefit Foreign travel history : 2011 [place] Immunization status for Shingles /Flu/ PNA/ tetanus :current Transfusion history: no  Preventive health surveillance status of colonoscopy as per protocol/ OZH:YQMVHQI Dental care:  Every 6 mos Chart reviewed &  Updated. Active issues reviewed & addressed.      Review of Systems He is on a heart healthy diet; he exercises as walking 45 minutes 4-5 times per week without symptoms. Specifically he denies chest pain, palpitations, dyspnea, or claudication. Family history is negative for premature coronary disease. With CAD his LDL goal is less than 70. No gout attack since Allopurinol started.     Objective:   Physical Exam Gen.:  well-nourished in appearance. Alert, appropriate and cooperative throughout exam. Head: Normocephalic without obvious abnormalities;  pattern alopecia ; moustache & beard Eyes: No corneal or conjunctival inflammation noted. Pupils equal round reactive to light . Extraocular motion intact. Vision grossly normal with lenses Ears: External  ear exam reveals no significant lesions or deformities. Canals clear .TMs normal. Hearing is grossly normal bilaterally. Nose: External nasal exam reveals no deformity or  inflammation. Nasal mucosa are pink and moist. No lesions or exudates noted.   Mouth: Oral mucosa and oropharynx reveal no lesions or exudates. Teeth in good repair. Neck: No deformities, masses, or tenderness noted. Range of motion & Thyroid normal. Lungs: Normal respiratory effort; chest expands symmetrically. Lungs are clear to auscultation without rales, wheezes, or increased work of breathing. Heart: Normal rate and rhythm. Normal S1 and S2. No gallop, click, or rub. S4 w/o murmur. Heart sounds somewhat distant Abdomen: Bowel sounds normal; abdomen soft and nontender. No masses, organomegaly or hernias noted. Genitalia: As per [name]                                 Musculoskeletal/extremities: No deformity or scoliosis noted of  the thoracic or lumbar spine.  No clubbing, cyanosis, edema, or significant extremity  deformity noted. Range of motion normal .Tone & strength  Normal. Joints normal . Nail health good. Able to lie down & sit up w/o help. Negative SLR bilaterally Vascular: Carotid, radial artery, dorsalis pedis and  posterior tibial pulses are equal ; but pedal pulses slightly decreased.No bruits present. Neurologic: Alert and oriented x3. Deep tendon reflexes symmetrical and normal.        Skin: Intact without suspicious lesions or rashes. Lymph: No cervical, axillary lymphadenopathy present. Psych: Mood and affect are normal. Normally interactive  Assessment & Plan:  #1 Medicare Wellness Exam; criteria met ; data entered #2 CAD with asymptomatic T changes1 & aVL; stable V4-6 ST-T changes since 12/12/10 Plan:  Assessments made/ Orders entered

## 2012-07-29 ENCOUNTER — Ambulatory Visit: Payer: Medicare PPO

## 2012-07-29 ENCOUNTER — Encounter: Payer: Self-pay | Admitting: *Deleted

## 2012-07-29 DIAGNOSIS — R7309 Other abnormal glucose: Secondary | ICD-10-CM

## 2012-07-30 ENCOUNTER — Encounter: Payer: Self-pay | Admitting: Internal Medicine

## 2012-08-14 ENCOUNTER — Ambulatory Visit: Payer: Medicare PPO | Admitting: Physician Assistant

## 2012-08-19 ENCOUNTER — Ambulatory Visit: Payer: Medicare PPO | Admitting: Physician Assistant

## 2012-08-20 ENCOUNTER — Ambulatory Visit: Payer: Medicare PPO | Admitting: Physician Assistant

## 2012-09-03 ENCOUNTER — Other Ambulatory Visit: Payer: Self-pay | Admitting: Cardiology

## 2012-09-04 ENCOUNTER — Ambulatory Visit (INDEPENDENT_AMBULATORY_CARE_PROVIDER_SITE_OTHER): Payer: Medicare PPO | Admitting: Physician Assistant

## 2012-09-04 ENCOUNTER — Encounter: Payer: Self-pay | Admitting: Physician Assistant

## 2012-09-04 VITALS — BP 132/82 | HR 79 | Ht 71.0 in | Wt 223.0 lb

## 2012-09-04 DIAGNOSIS — R9431 Abnormal electrocardiogram [ECG] [EKG]: Secondary | ICD-10-CM

## 2012-09-04 DIAGNOSIS — I2510 Atherosclerotic heart disease of native coronary artery without angina pectoris: Secondary | ICD-10-CM

## 2012-09-04 DIAGNOSIS — E785 Hyperlipidemia, unspecified: Secondary | ICD-10-CM

## 2012-09-04 NOTE — Progress Notes (Signed)
[address] [place], [place]  [address] Phone: [phone] Fax:  [phone]  Date:  09/04/2012   ID:  Bryce Maldonado, DOB 1940-08-04, MRN [id number]  PCP:  Marga Melnick, MD  Cardiologist:  Dr. Peter Swaziland     History of Present Illness: Bryce Maldonado is a 72 y.o. male who returns for follow up.  He has a hx of CAD, s/p CABG in 1995, HL, prostate CA, DJD, spinal stenosis. Prior to left total hip replacement in 03/2010 he underwent a Lexiscan Myoview. This demonstrated an EF of 66% and inferior ischemia. LHC 01/2010: Proximal LAD 90%, proximal OM totally occluded, RCA occluded, SVG-PDA patent, SVG-diagonal patent, SVG-OM patent, LIMA-LAD patent. There were left to right collaterals. EF 60-65%. He was treated medically. Last seen by Dr. Swaziland 03/2011.  Recent ECG at his PCPs office was felt to be somewhat different and he was asked to f/u.  The patient denies chest pain, shortness of breath, syncope, orthopnea, PND or significant pedal edema. He walks 4-5 days a week for 40-45 minutes.  He works out at [employer] as well.  He has no limitations.    Labs (6/14):  K 4.2, creatinine 0.8, ALT 29, TC 160, TG 143, HDL 52.3, LDL 79, Hgb 15.7, TSH 1.09  Wt Readings from Last 3 Encounters:  07/26/12 226 lb (102.513 kg)  06/25/12 230 lb (104.327 kg)  06/13/12 230 lb (104.327 kg)     Past Medical History  Diagnosis Date  . Spinal stenosis 2004    L4 radiculopathy, herniated nucleus pulposus L4-5  . Dyslipidemia   . Coronary artery disease     post CABG x4 -- in 1995 -- due to three-vessel coronary artery disease  . BPH (benign prostatic hyperplasia)     Elevated PSA  . Anemia   . Gout   . Diverticular disease   . Childhood asthma   . Osteoarthritis   . Tinnitus   . Prostate cancer     [name]    Current Outpatient Prescriptions  Medication Sig Dispense Refill  . allopurinol (ZYLOPRIM) 300 MG tablet TAKE 1 TABLET DAILY  90 tablet  2  . aspirin 81 MG  tablet Take 81 mg by mouth daily.        . CRESTOR 10 MG tablet TAKE 1 TABLET EVERY DAY  90 tablet  2  . fexofenadine (ALLEGRA) 180 MG tablet Take 180 mg by mouth every morning.       [name] KRILL OIL PO Take by mouth daily.      . Multiple Vitamins-Minerals (CENTRUM PO) Take 1 tablet by mouth daily.       . Nutritional Supplements (MELATONIN PO) Take 1 each by mouth at bedtime.        [name] venlafaxine XR (EFFEXOR XR) 75 MG 24 hr capsule Take 1 capsule (75 mg total) by mouth daily.  90 capsule  2  . ZETIA 10 MG tablet TAKE 1 TABLET EVERY DAY   (NEED MD APPOINTMENT  FOR  FURTHER  REFILLS)  90 tablet  0   No current facility-administered medications for this visit.    Allergies:    Allergies  Allergen Reactions  . Naproxen Sodium     rash    Social History:  The patient  reports that he quit smoking about 42 years ago. He has never used smokeless tobacco. He reports that he drinks about 3.5 ounces of alcohol per week. He reports that he does not use  illicit drugs.   ROS:  Please see the history of present illness.      All other systems reviewed and negative.   PHYSICAL EXAM: VS:  BP 132/82  Pulse 79  Ht 5\' 11"  (1.803 m)  Wt 223 lb (101.152 kg)  BMI 31.12 kg/m2 Well nourished, well developed, in no acute distress HEENT: normal Neck: no JVD Vascular:  No carotid bruits Cardiac:  normal S1, S2; RRR; no murmur Lungs:  clear to auscultation bilaterally, no wheezing, rhonchi or rales Abd: soft, nontender, no hepatomegaly Ext: no edema Skin: warm and dry Neuro:  CNs 2-12 intact, no focal abnormalities noted  EKG:  NSR, HR 79, normal axis, inf Q waves, NSSTTW changes, no significant change from prior tracing  ASSESSMENT AND PLAN:  1. CAD:  I reviewed his prior ECGs and there have been no significant changes over the last 2 years.  He is active without anginal symptoms.  He had a LHC in 2012 that demonstrated patent bypass grafts.  Continue medical Rx.  Continue ASA and statin.   2. Hyperlipidemia:  Recent LDL optimal.  Continue current Rx.  3. Disposition:  F/u with Dr. Peter Swaziland in 1 year.   Signed, Tereso Newcomer, PA-C  09/04/2012 3:25 PM

## 2012-09-04 NOTE — Patient Instructions (Addendum)
Your physician recommends that you continue on your current medications as directed. Please refer to the Current Medication list given to you today.  Your physician recommends that you schedule a follow-up appointment in: 1 YEAR WITH DR.JORDAN

## 2012-09-05 ENCOUNTER — Other Ambulatory Visit: Payer: Self-pay

## 2012-09-05 MED ORDER — EZETIMIBE 10 MG PO TABS: ORAL_TABLET | ORAL | Status: DC

## 2012-11-01 ENCOUNTER — Other Ambulatory Visit: Payer: Self-pay | Admitting: *Deleted

## 2012-11-01 MED ORDER — ALLOPURINOL 300 MG PO TABS: ORAL_TABLET | ORAL | Status: DC

## 2012-11-01 NOTE — Telephone Encounter (Signed)
Allopurinol refill sent to pharmacy

## 2012-11-11 ENCOUNTER — Encounter: Payer: Self-pay | Admitting: Internal Medicine

## 2012-11-11 ENCOUNTER — Ambulatory Visit (INDEPENDENT_AMBULATORY_CARE_PROVIDER_SITE_OTHER): Payer: Medicare PPO | Admitting: Internal Medicine

## 2012-11-11 VITALS — BP 161/71 | HR 106 | Temp 97.7°F | Wt 226.0 lb

## 2012-11-11 DIAGNOSIS — R1084 Generalized abdominal pain: Secondary | ICD-10-CM

## 2012-11-11 DIAGNOSIS — R109 Unspecified abdominal pain: Secondary | ICD-10-CM

## 2012-11-11 LAB — CBC WITH DIFFERENTIAL/PLATELET
Basophils Relative: 0.9 % (ref 0.0–3.0)
Eosinophils Absolute: 0.2 10*3/uL (ref 0.0–0.7)
Eosinophils Relative: 2.6 % (ref 0.0–5.0)
Hemoglobin: 16.1 g/dL (ref 13.0–17.0)
Lymphocytes Relative: 21.6 % (ref 12.0–46.0)
Monocytes Relative: 10.8 % (ref 3.0–12.0)
Neutro Abs: 4.4 10*3/uL (ref 1.4–7.7)
Neutrophils Relative %: 64.1 % (ref 43.0–77.0)
RBC: 5.05 Mil/uL (ref 4.22–5.81)
WBC: 6.8 10*3/uL (ref 4.5–10.5)

## 2012-11-11 LAB — HEPATIC FUNCTION PANEL
ALT: 29 U/L (ref 0–53)
AST: 20 U/L (ref 0–37)
Alkaline Phosphatase: 52 U/L (ref 39–117)
Bilirubin, Direct: 0 mg/dL (ref 0.0–0.3)
Total Bilirubin: 0.3 mg/dL (ref 0.3–1.2)
Total Protein: 7.8 g/dL (ref 6.0–8.3)

## 2012-11-11 MED ORDER — OMEPRAZOLE MAGNESIUM 20 MG PO TBEC: 20.0000 mg | DELAYED_RELEASE_TABLET | Freq: Every day | ORAL | Status: DC

## 2012-11-11 NOTE — Patient Instructions (Addendum)
Plain Mucinex (NOT D) for thick secretions ;force NON dairy fluids .   Nasal cleansing in the shower as discussed with lather of mild shampoo.After 10 seconds wash off lather while  exhaling through nostrils. Make sure that all residual soap is removed to prevent irritation.  Nasacort AQ OTC 1 spray in each nostril twice a day as needed. Use the "crossover" technique into opposite nostril spraying toward opposite ear @ 45 degree angle, not straight up into nostril.  Use a Neti pot daily only  as needed for significant sinus congestion; going from open side to congested side . Plain Allegra (NOT D )  160 daily , Loratidine 10 mg , OR Zyrtec 10 mg @ bedtime  as needed for itchy eyes & sneezing.   Reflux of gastric acid may be asymptomatic as this may occur mainly during sleep.The triggers for reflux  include stress; the "aspirin family" ; alcohol; peppermint; and caffeine (coffee, tea, cola, and chocolate). The aspirin family would include aspirin and the nonsteroidal agents such as ibuprofen &  Naproxen. Tylenol would not cause reflux. If having symptoms ; food & drink should be avoided for @ least 2 hours before going to bed.  Please complete and return stool cards; these will determine whether there is any gastrointestinal bleeding risk.

## 2012-11-11 NOTE — Progress Notes (Signed)
  Subjective:    Patient ID: Bryce Maldonado, male    DOB: December 20, 1940, 72 y.o.   MRN: [id number]  HPI He has had abdominal pain for approximately 3 weeks which appeared without any specific trigger. It is described as nonradiating from the epigastric area to the suprapubic area, and all up to level V. He states it is constant but is worse postprandially with increased gas. He questions whether it might be related to postnasal drainage. He is taking no medications for this  He has some discomfort in his back which he attributes to coughing. This is unrelated to the abdominal pain.  He does drink one-2 cups of coffee a day, minimal tea, 2-3 glasses of caffeine free cola, and one serving of chocolate daily. He also drinks 3- 5 alcoholic beverages weekly. He is on 81 mg of aspirin daily  He's never had an endoscopy. Colonoscopy is current negative.    Review of Systems  He has no associated nausea, vomiting, constipation, diarrhea, melena, rectal bleeding. He also denies significant dyspepsia, dysphagia, anorexia, or hematemesis. Weight is stable.  He has no fever, chills, or sweats  He also denies dysuria, pyuria, or hematuria.  There is no change in color or temperature of the skin in the area of the discomfort.     Objective:   Physical Exam Gen.:  well-nourished in appearance. Alert, appropriate and cooperative throughout exam.   Eyes: No corneal or conjunctival inflammation noted.No icterus Ears: External  ear exam reveals no significant lesions or deformities. Canals clear .TMs normal. Hearing is grossly normal bilaterally. Nose: External nasal exam reveals no deformity or inflammation. Nasal mucosa are pink and moist. No lesions or exudates noted.   Mouth: Oral mucosa and oropharynx reveal no lesions or exudates. Teeth in good repair. Neck: No deformities, masses, or tenderness noted.  Thyroid normal Lungs: Normal respiratory effort; chest expands symmetrically. Lungs are clear to  auscultation without rales, wheezes, or increased work of breathing. Heart: Normal rate and rhythm. Normal S1 and S2. No gallop, click, or rub. Grade 1/6 systolic murmur Abdomen: Bowel sounds normal; abdomen soft but slightly diffusely tender. No masses, organomegaly or hernias noted. Protuberant                           Musculoskeletal/extremities:  No clubbing, cyanosis, edema, or significant extremity  deformity noted. Range of motion normal .Tone & strength  Normal. Joints normal . Nail health good. Able to lie down & sit up w/o help. Negative SLR bilaterally Vascular: Carotid, radial artery, dorsalis pedis and  posterior tibial pulses are full and equal. No bruits present. Neurologic: Alert and oriented x3.          Skin: Intact without suspicious lesions or rashes. No jaundice or tenting Lymph: No cervical, axillary lymphadenopathy present. Psych: Mood and affect are normal. Normally interactive                                                                                        Assessment & Plan:  #1abdominal pain  See Orders

## 2012-11-12 ENCOUNTER — Encounter: Payer: Self-pay | Admitting: *Deleted

## 2012-11-12 LAB — POCT URINALYSIS DIPSTICK
Bilirubin, UA: NEGATIVE
Glucose, UA: NEGATIVE
Ketones, UA: NEGATIVE
Leukocytes, UA: NEGATIVE
Nitrite, UA: NEGATIVE
pH, UA: 6

## 2012-11-13 ENCOUNTER — Encounter: Payer: Self-pay | Admitting: *Deleted

## 2012-11-13 NOTE — Progress Notes (Signed)
Letter mailed to patient.

## 2012-12-05 ENCOUNTER — Other Ambulatory Visit: Payer: Self-pay

## 2013-02-03 ENCOUNTER — Other Ambulatory Visit: Payer: Self-pay | Admitting: Cardiology

## 2013-02-12 ENCOUNTER — Other Ambulatory Visit: Payer: Self-pay | Admitting: *Deleted

## 2013-02-12 ENCOUNTER — Telehealth: Payer: Self-pay | Admitting: *Deleted

## 2013-02-12 DIAGNOSIS — F419 Anxiety disorder, unspecified: Secondary | ICD-10-CM

## 2013-02-12 MED ORDER — VENLAFAXINE HCL ER 75 MG PO CP24: 75.0000 mg | ORAL_CAPSULE | Freq: Every day | ORAL | Status: DC

## 2013-02-12 NOTE — Telephone Encounter (Signed)
PA paperwork for Adderall faxed to [facility]. Awaiting response. JG//CMA

## 2013-04-04 ENCOUNTER — Other Ambulatory Visit: Payer: Self-pay

## 2013-04-04 MED ORDER — ALLOPURINOL 300 MG PO TABS: ORAL_TABLET | ORAL | Status: DC

## 2013-04-09 ENCOUNTER — Ambulatory Visit (INDEPENDENT_AMBULATORY_CARE_PROVIDER_SITE_OTHER): Payer: Medicare PPO | Admitting: Internal Medicine

## 2013-04-09 ENCOUNTER — Other Ambulatory Visit (INDEPENDENT_AMBULATORY_CARE_PROVIDER_SITE_OTHER): Payer: Medicare PPO

## 2013-04-09 ENCOUNTER — Encounter: Payer: Self-pay | Admitting: Internal Medicine

## 2013-04-09 VITALS — BP 138/70 | HR 95 | Temp 97.4°F | Wt 227.2 lb

## 2013-04-09 DIAGNOSIS — Z23 Encounter for immunization: Secondary | ICD-10-CM

## 2013-04-09 DIAGNOSIS — B372 Candidiasis of skin and nail: Secondary | ICD-10-CM

## 2013-04-09 DIAGNOSIS — R7309 Other abnormal glucose: Secondary | ICD-10-CM

## 2013-04-09 LAB — HEMOGLOBIN A1C: Hgb A1c MFr Bld: 9.2 % — ABNORMAL HIGH (ref 4.6–6.5)

## 2013-04-09 MED ORDER — KETOCONAZOLE 2 % EX CREA: 1.0000 "application " | TOPICAL_CREAM | Freq: Every day | CUTANEOUS | Status: DC

## 2013-04-09 NOTE — Progress Notes (Signed)
   Subjective:    Patient ID: Bryce Maldonado, male    DOB: 1940-11-04, 73 y.o.   MRN: [id number]  HPI   Symptoms began approximately 2 weeks ago as a rash in the groin area. This was associated with a red color change and tenderness to palpation. There were no specific triggers for this. Antibiotics last 2-3 mos ago from Dentist.He did apply antibiotic ointment without response.    Review of Systems  He specifically denies fever, chills, sweats. He's had no purulence at the area of the rash or blistering.  He has had significant hyperglycemia in the past; his A1c's have been in the nondiabetic range until 07/29/12. He states he never received those results . Not monitoring glucose.     Objective:   Physical Exam  He appears well-nourished; in no distress  He has no lymphadenopathy about the neck or axilla  Classic Candida is present in the left inguinal crease.  Normal penis.  He has no lymphadenopathy in the inguinal area.  The skin in this area is slightly damp.        Assessment & Plan:  #1 candidal dermatitis left inguinal area  #2 history of abnormal glucose; A1c in the diabetic range 6/30  See orders

## 2013-04-09 NOTE — Progress Notes (Signed)
Pre visit review using our clinic review tool, if applicable. No additional management support is needed unless otherwise documented below in the visit note. 

## 2013-04-09 NOTE — Addendum Note (Signed)
Addended by: Harl Bowie on: 04/09/2013 12:00 PM   Modules accepted: Orders

## 2013-04-09 NOTE — Patient Instructions (Signed)
Your next office appointment will be determined based upon review of your pending labs . 

## 2013-04-15 ENCOUNTER — Telehealth: Payer: Self-pay

## 2013-04-15 NOTE — Telephone Encounter (Signed)
Patient called and requested results of his labs. Advised per [facility] instructions. Patient has a follow up appt on 04/22/2013.

## 2013-04-17 ENCOUNTER — Other Ambulatory Visit: Payer: Self-pay | Admitting: *Deleted

## 2013-04-17 NOTE — Telephone Encounter (Signed)
Ketonazole rx has been resent to pt local pharmacy no need for mail service to send...Bryce Maldonado

## 2013-04-22 ENCOUNTER — Encounter: Payer: Self-pay | Admitting: Internal Medicine

## 2013-04-22 ENCOUNTER — Ambulatory Visit (INDEPENDENT_AMBULATORY_CARE_PROVIDER_SITE_OTHER): Payer: Medicare PPO | Admitting: Internal Medicine

## 2013-04-22 VITALS — BP 128/80 | HR 109 | Temp 97.6°F | Resp 14 | Wt 225.0 lb

## 2013-04-22 DIAGNOSIS — IMO0001 Reserved for inherently not codable concepts without codable children: Secondary | ICD-10-CM

## 2013-04-22 DIAGNOSIS — B372 Candidiasis of skin and nail: Secondary | ICD-10-CM

## 2013-04-22 DIAGNOSIS — E1365 Other specified diabetes mellitus with hyperglycemia: Secondary | ICD-10-CM

## 2013-04-22 DIAGNOSIS — E1165 Type 2 diabetes mellitus with hyperglycemia: Principal | ICD-10-CM

## 2013-04-22 DIAGNOSIS — E1351 Other specified diabetes mellitus with diabetic peripheral angiopathy without gangrene: Secondary | ICD-10-CM | POA: Insufficient documentation

## 2013-04-22 DIAGNOSIS — E119 Type 2 diabetes mellitus without complications: Secondary | ICD-10-CM | POA: Insufficient documentation

## 2013-04-22 MED ORDER — ONETOUCH DELICA LANCETS 33G MISC: Status: DC

## 2013-04-22 MED ORDER — METFORMIN HCL 500 MG PO TABS: 500.0000 mg | ORAL_TABLET | Freq: Two times a day (BID) | ORAL | Status: DC

## 2013-04-22 MED ORDER — KETOCONAZOLE 2 % EX CREA: 1.0000 "application " | TOPICAL_CREAM | Freq: Every day | CUTANEOUS | Status: DC

## 2013-04-22 MED ORDER — GLUCOSE BLOOD VI STRP: ORAL_STRIP | Status: DC

## 2013-04-22 NOTE — Patient Instructions (Signed)
Follow a low carb nutrition program such as  The New Sugar Busters or Atkins as closely as possible to prevent Diabetes progression & complications.  White carbohydrates (potatoes, rice, bread, and pasta) cause a high spike of the sugar level which stays elevated for a significant period of time (called sugar"load").  For example a  baked potato has a cup of sugar and a  french fry  2 teaspoons of sugar.  More complex carbs such as yams, wild  rice, whole grained bread &  wheat pasta have been much lower spike and persistent load of sugar than the white carbs. The pancreas excretes excess insulin in response to the high spike & load of sugar . Over time the pancreas can actually run out of insulin necessitating insulin shots. Monitor the glucose before eating  (fasting blood sugar) Monday, Wednesday, Friday, and Sunday. This should range from 100-150. Check glucose 2 hours after  breakfast on Tuesday; 2 hours after lunch on Thursday; and 2 hours after the meal on Saturday. This value should average  less than 180, ideally less than 160. Cardiovascular exercise, this can be as simple a program as walking, is recommended 30-45 minutes 3-4 times per week. If you're not exercising you should take 6-8 weeks to build up to this level.

## 2013-04-22 NOTE — Progress Notes (Signed)
Pre visit review using our clinic review tool, if applicable. No additional management support is needed unless otherwise documented below in the visit note. 

## 2013-04-22 NOTE — Progress Notes (Signed)
Subjective:    Patient ID: Bryce Maldonado, male    DOB: 05-Jul-1940, 73 y.o.   MRN: [id number]  HPI Diabetes status assessment: Fasting or morning glucose range average is not monitored @ present. Highest glucose 2 hours after any meal is not monitored.                                       No regular exercise described . No specific nutrition/diet followed until Atkins' started after A1c found to be 9.2%. No medication @ this time. No medication adverse effects noted to present regime. Eye exam current. Foot care not current   Review of Systems  No excess thirst ;  excess hunger ; or excess urination reported                              No lightheadedness with standing reported No chest pain ; palpitations ; claudication described .                                                                                                                            No non healing skin  ulcers or sores of extremities noted. No numbness or tingling or burning in feet described                                                                                                                Change in weight of  2.3 pound loss with Atkins' No blurred,double, or loss of vision reported  .            Objective:   Physical Exam Appears  well-nourished & in no acute distress  No carotid bruits are present.No neck pain distention present at 10 - 15 degrees. Thyroid normal to palpation  Slight tachycardia with no gallop or murmur  Chest is clear with no increased work of breathing  There is no evidence of aortic aneurysm or renal artery bruits  Abdomen soft with no organomegaly or masses. No HJR  No  cyanosis or edema present.  Pedal pulses are intact   No ischemic skin changes are present .Resolving Candida in groin   Fingernails/ toenails healthy but clubbing present. Pes planus  Alert and oriented. Strength, tone normal          Assessment & Plan:  #1 DM, uncontrolled #2  Candidasis, inguinal See orders. Glucometer teaching conducted

## 2013-04-29 ENCOUNTER — Telehealth: Payer: Self-pay

## 2013-04-29 NOTE — Telephone Encounter (Signed)
Relevant patient education assigned to patient using Emmi. ° °

## 2013-06-22 ENCOUNTER — Other Ambulatory Visit: Payer: Self-pay | Admitting: Cardiology

## 2013-08-10 ENCOUNTER — Other Ambulatory Visit: Payer: Self-pay | Admitting: Internal Medicine

## 2013-08-11 ENCOUNTER — Ambulatory Visit (INDEPENDENT_AMBULATORY_CARE_PROVIDER_SITE_OTHER): Payer: Medicare PPO | Admitting: Internal Medicine

## 2013-08-11 ENCOUNTER — Encounter: Payer: Self-pay | Admitting: Internal Medicine

## 2013-08-11 ENCOUNTER — Other Ambulatory Visit (INDEPENDENT_AMBULATORY_CARE_PROVIDER_SITE_OTHER): Payer: Medicare PPO

## 2013-08-11 VITALS — BP 144/90 | HR 82 | Temp 98.0°F | Ht 72.0 in | Wt 218.2 lb

## 2013-08-11 DIAGNOSIS — E785 Hyperlipidemia, unspecified: Secondary | ICD-10-CM

## 2013-08-11 DIAGNOSIS — E1165 Type 2 diabetes mellitus with hyperglycemia: Principal | ICD-10-CM

## 2013-08-11 DIAGNOSIS — IMO0001 Reserved for inherently not codable concepts without codable children: Secondary | ICD-10-CM

## 2013-08-11 DIAGNOSIS — Z8546 Personal history of malignant neoplasm of prostate: Secondary | ICD-10-CM

## 2013-08-11 DIAGNOSIS — M109 Gout, unspecified: Secondary | ICD-10-CM

## 2013-08-11 LAB — CBC WITH DIFFERENTIAL/PLATELET
BASOS PCT: 0.3 % (ref 0.0–3.0)
Basophils Absolute: 0 10*3/uL (ref 0.0–0.1)
EOS ABS: 0.2 10*3/uL (ref 0.0–0.7)
EOS PCT: 2.8 % (ref 0.0–5.0)
HCT: 48.7 % (ref 39.0–52.0)
HEMOGLOBIN: 16 g/dL (ref 13.0–17.0)
LYMPHS PCT: 26.8 % (ref 12.0–46.0)
Lymphs Abs: 2.1 10*3/uL (ref 0.7–4.0)
MCHC: 32.9 g/dL (ref 30.0–36.0)
MCV: 93.6 fl (ref 78.0–100.0)
Monocytes Absolute: 0.9 10*3/uL (ref 0.1–1.0)
Monocytes Relative: 11.1 % (ref 3.0–12.0)
NEUTROS ABS: 4.7 10*3/uL (ref 1.4–7.7)
Neutrophils Relative %: 59 % (ref 43.0–77.0)
Platelets: 334 10*3/uL (ref 150.0–400.0)
RBC: 5.2 Mil/uL (ref 4.22–5.81)
RDW: 13.5 % (ref 11.5–15.5)
WBC: 8 10*3/uL (ref 4.0–10.5)

## 2013-08-11 LAB — HEMOGLOBIN A1C: Hgb A1c MFr Bld: 7.3 % — ABNORMAL HIGH (ref 4.6–6.5)

## 2013-08-11 NOTE — Assessment & Plan Note (Signed)
CBC & dif 

## 2013-08-11 NOTE — Progress Notes (Signed)
   Subjective:    Patient ID: Bryce Maldonado, male    DOB: October 17, 1940, 73 y.o.   MRN: [id number]  HPI He is here to assess status of active health conditions:  Diet/ nutrition:Atkin's Exercise program:walking 4X/ week for 40 min  Diabetes :  FBS range/average:no monitor Highest 2 hr post meal glucose:no monitor Medication compliance:yes Hypoglycemia:no Ophthamology care:UTD Podiatry care:not UTD  HYPERLIPIDEMIA: Disease Monitoring: Medication Compliance:yes     Review of Systems  Chest pain, palpitations: no      Dyspnea:no Edema:no Claudication: no Lightheadedness,Syncope:no Weight gain/loss:down 8# on purpose Polyuria/phagia/dipsia: no     Blurred vision /diplopia/lossof vision:no Limb numbness/tingling/burning:no Non healing skin lesions:no Abd pain, bowel changes: no  Myalgias: no Memory loss:no       Objective:   Physical Exam Gen.: Healthy and well-nourished in appearance. Alert, appropriate and cooperative throughout exam. Appears younger than stated age  Head: Normocephalic without obvious abnormalities;  pattern alopecia . Goatee. Eyes: No corneal or conjunctival inflammation noted. Pupils equal round reactive to light and accommodation. Extraocular motion intact. Ears: External  ear exam reveals no significant lesions or deformities. Canals clear .TMs normal. Hearing is grossly normal bilaterally. Nose: External nasal exam reveals no deformity or inflammation. Nasal mucosa are pink and moist. No lesions or exudates noted.   Mouth: Oral mucosa and oropharynx reveal no lesions or exudates. Teeth in good repair. Neck: No deformities, masses, or tenderness noted. Range of motion & Thyroid normal. Lungs: Normal respiratory effort; chest expands symmetrically. Lungs are clear to auscultation without rales, wheezes, or increased work of breathing. Heart: Normal rate and rhythm. Normal S1 and S2. No gallop, click, or rub. No murmur. Abdomen: Slightly  protuberant.Bowel sounds normal; abdomen soft and nontender. No masses, organomegaly or hernias noted. Genitalia:  as per Urology                          Musculoskeletal/extremities: No deformity or scoliosis noted of  the thoracic or lumbar spine.  No clubbing, cyanosis, edema, or significant extremity  deformity noted. Range of motion normal .Tone & strength normal. Hand joints normal  Fingernail / toenail health good. Able to lie down & sit up w/o help. Negative SLR bilaterally Vascular: Carotid, radial artery, dorsalis pedis and  posterior tibial pulses are equal. Decreased DPP.No bruits present. Neurologic: Alert and oriented x3. Deep tendon reflexes symmetrical and normal.  Gait normal .       Skin: Intact without suspicious lesions or rashes. Lymph: No cervical, axillary lymphadenopathy present. Psych: Mood and affect are normal. Normally interactive                                                                                        Assessment & Plan:  See Current Assessment & Plan in Problem List under specific Diagnosis

## 2013-08-11 NOTE — Patient Instructions (Signed)
Your next office appointment will be determined based upon review of your pending labs . Those instructions will be transmitted to you  by mail. 

## 2013-08-11 NOTE — Assessment & Plan Note (Signed)
Lipids, LFTs, TSH ,CK 

## 2013-08-11 NOTE — Assessment & Plan Note (Signed)
A1c , urine microalbumin, BMET 

## 2013-08-11 NOTE — Assessment & Plan Note (Signed)
Uric Acid 

## 2013-08-11 NOTE — Progress Notes (Signed)
Pre visit review using our clinic review tool, if applicable. No additional management support is needed unless otherwise documented below in the visit note. 

## 2013-08-12 ENCOUNTER — Other Ambulatory Visit: Payer: Self-pay | Admitting: Internal Medicine

## 2013-08-12 DIAGNOSIS — IMO0001 Reserved for inherently not codable concepts without codable children: Secondary | ICD-10-CM

## 2013-08-12 DIAGNOSIS — E1165 Type 2 diabetes mellitus with hyperglycemia: Principal | ICD-10-CM

## 2013-08-12 LAB — LIPID PANEL
Cholesterol: 142 mg/dL (ref 0–200)
HDL: 49.7 mg/dL (ref >39.00)
LDL Cholesterol: 57 mg/dL (ref 0–99)
NonHDL: 92.3
TRIGLYCERIDES: 179 mg/dL — AB (ref 0.0–149.0)
Total CHOL/HDL Ratio: 3
VLDL: 35.8 mg/dL (ref 0.0–40.0)

## 2013-08-12 LAB — BASIC METABOLIC PANEL WITH GFR
BUN: 17 mg/dL (ref 6–23)
CO2: 22 meq/L (ref 19–32)
Calcium: 10.1 mg/dL (ref 8.4–10.5)
Chloride: 103 meq/L (ref 96–112)
Creatinine, Ser: 0.8 mg/dL (ref 0.4–1.5)
GFR: 96.84 mL/min
Glucose, Bld: 125 mg/dL — ABNORMAL HIGH (ref 70–99)
Potassium: 4.9 meq/L (ref 3.5–5.1)
Sodium: 140 meq/L (ref 135–145)

## 2013-08-12 LAB — URIC ACID: Uric Acid, Serum: 4.2 mg/dL (ref 4.0–7.8)

## 2013-08-12 LAB — HEPATIC FUNCTION PANEL
ALT: 26 U/L (ref 0–53)
AST: 21 U/L (ref 0–37)
Albumin: 4.7 g/dL (ref 3.5–5.2)
Alkaline Phosphatase: 54 U/L (ref 39–117)
Bilirubin, Direct: 0.1 mg/dL (ref 0.0–0.3)
Total Bilirubin: 0.5 mg/dL (ref 0.2–1.2)
Total Protein: 8.1 g/dL (ref 6.0–8.3)

## 2013-08-12 LAB — MICROALBUMIN / CREATININE URINE RATIO
Creatinine,U: 119.3 mg/dL
MICROALB UR: 1.2 mg/dL (ref 0.0–1.9)
MICROALB/CREAT RATIO: 1 mg/g (ref 0.0–30.0)

## 2013-08-12 LAB — TSH: TSH: 1.21 u[IU]/mL (ref 0.35–4.50)

## 2013-09-30 ENCOUNTER — Other Ambulatory Visit: Payer: Self-pay | Admitting: *Deleted

## 2013-09-30 MED ORDER — EZETIMIBE 10 MG PO TABS: ORAL_TABLET | ORAL | Status: DC

## 2013-10-21 ENCOUNTER — Encounter: Payer: Self-pay | Admitting: Cardiology

## 2013-10-21 ENCOUNTER — Ambulatory Visit (INDEPENDENT_AMBULATORY_CARE_PROVIDER_SITE_OTHER): Payer: Medicare PPO | Admitting: Cardiology

## 2013-10-21 VITALS — BP 122/70 | HR 91 | Ht 72.0 in | Wt 221.6 lb

## 2013-10-21 DIAGNOSIS — E1165 Type 2 diabetes mellitus with hyperglycemia: Secondary | ICD-10-CM

## 2013-10-21 DIAGNOSIS — I2510 Atherosclerotic heart disease of native coronary artery without angina pectoris: Secondary | ICD-10-CM

## 2013-10-21 DIAGNOSIS — E785 Hyperlipidemia, unspecified: Secondary | ICD-10-CM

## 2013-10-21 DIAGNOSIS — IMO0001 Reserved for inherently not codable concepts without codable children: Secondary | ICD-10-CM

## 2013-10-21 NOTE — Assessment & Plan Note (Signed)
Status post CABG in 1995 including an LIMA graft to the LAD, SVG to the Dx,SVG to the OM, and SVG to the PDA. Catheterization in January of 2012 showed that all his grafts were patent. He denies chest pain.

## 2013-10-21 NOTE — Assessment & Plan Note (Signed)
Followed by PCP

## 2013-10-21 NOTE — Patient Instructions (Signed)
The PA wants you to follow-up in:  1 year with Dr. Martinique. You will receive a reminder letter in the mail two months in advance. If you don't receive a letter, please call our office to schedule the follow-up appointment.

## 2013-10-21 NOTE — Progress Notes (Signed)
10/21/2013 MACDONALD RIGOR   Sep 19, 1940  [id number]  Primary Physicia [name], MD Primary Cardiologist: Dr Martinique  HPI:  The pt has a hx of CAD, s/p CABG in 1995, dyslipidemia, prostate CA, DJD s/p bilat THR, and spinal stenosis. Prior to left total hip replacement in 03/2010 he underwent a Lexiscan Myoview. This demonstrated an EF of 66% and inferior ischemia. LHC 01/2010 showed patent grafts. Last seen by Dr. Martinique 03/2011, he saw Richardson Dopp in Aug 2014. Since we saw him last he denies any cardiac issues-no chest pain, unusual dyspnea, or palpitations.     Current Outpatient Prescriptions  Medication Sig Dispense Refill  . allopurinol (ZYLOPRIM) 300 MG tablet TAKE 1 TABLET DAILY  90 tablet  1  . aspirin 81 MG tablet Take 81 mg by mouth daily.        . CRESTOR 10 MG tablet TAKE 1 TABLET EVERY DAY  90 tablet  2  . ezetimibe (ZETIA) 10 MG tablet TAKE 1 TABLET EVERY DAY  90 tablet  0  . fexofenadine (ALLEGRA) 180 MG tablet Take 180 mg by mouth every morning.       [name] glucose blood (ONETOUCH VERIO) test strip USE AS DIRECTED.  CHECK BLOOD SUGAR ONCE DAILY.  DX:250.02  100 each  12  . ketoconazole (NIZORAL) 2 % cream Apply 1 application topically daily. Apply bid & blow dry  30 g  0  . metFORMIN (GLUCOPHAGE) 500 MG tablet TAKE 1 TABLET BY MOUTH TWICE DAILY WITH A MEAL  60 tablet  5  . Multiple Vitamins-Minerals (CENTRUM PO) Take 1 tablet by mouth daily.       . Nutritional Supplements (MELATONIN PO) Take 1 each by mouth at bedtime.        Glory Rosebush DELICA LANCETS 63J MISC CHECK BLOOD SUGAR ONCE DAILY.  DX:250.02  USE AS DIRECTED.  100 each  11  . venlafaxine XR (EFFEXOR XR) 75 MG 24 hr capsule Take 1 capsule (75 mg total) by mouth daily.  90 capsule  2   No current facility-administered medications for this visit.    Allergies  Allergen Reactions  . Naproxen Sodium     rash    History   Social History  . Marital Status: Married    Spouse Name: N/A    Number of Children:  N/A  . Years of Education: N/A   Occupational History  . Not on file.   Social History Main Topics  . Smoking status: Former Smoker -- 2.50 packs/day for 15 years    Quit date: 01/30/1970  . Smokeless tobacco: Never Used     Comment: smoked 1959-1972, up to 1.5 ppd  . Alcohol Use: 2.0 oz/week    4 drink(s) per week  . Drug Use: No  . Sexual Activity: Not on file   Other Topics Concern  . Not on file   Social History Narrative  . No narrative on file     Review of Systems: General: negative for chills, fever, night sweats or weight changes.  Cardiovascular: negative for chest pain, dyspnea on exertion, edema, orthopnea, palpitations, paroxysmal nocturnal dyspnea or shortness of breath Dermatological: negative for rash Respiratory: negative for cough or wheezing Urologic: negative for hematuria Abdominal: negative for nausea, vomiting, diarrhea, bright red blood per rectum, melena, or hematemesis Neurologic: negative for visual changes, syncope, or dizziness All other systems reviewed and are otherwise negative except as noted above.    Blood pressure 122/70, pulse 91, height 6' (1.829  m), weight 221 lb 9.6 oz (100.517 kg).  General appearance: alert, cooperative and no distress Neck: no carotid bruit and no JVD Lungs: clear to auscultation bilaterally Heart: regular rate and rhythm  EKG NSR, NSST changes  ASSESSMENT AND PLAN:   C A D Status post CABG in 1995 including an LIMA graft to the LAD, SVG to the Dx,SVG to the OM, and SVG to the PDA. Catheterization in January of 2012 showed that all his grafts were patent. He denies chest pain.    Type II or unspecified type diabetes mellitus without mention of complication, uncontrolled Followed by PCP  HYPERLIPIDEMIA NEC/NOS Followed by PCP   PLAN  I suggested the addition of a low dose beta blocker to his current medications. He declined saying he wasn't interested in any new medications unless absolutely neccessary.  He will follow up with Dr Martinique in a year. He knows he can be seen anytime if he has any cardiac issues.   [facility]PA[facility] 10/21/2013 4:50 PM

## 2013-11-24 ENCOUNTER — Other Ambulatory Visit: Payer: Self-pay

## 2013-11-24 DIAGNOSIS — F419 Anxiety disorder, unspecified: Secondary | ICD-10-CM

## 2013-11-24 MED ORDER — VENLAFAXINE HCL ER 75 MG PO CP24: 75.0000 mg | ORAL_CAPSULE | Freq: Every day | ORAL | Status: DC

## 2013-11-30 ENCOUNTER — Telehealth: Payer: Self-pay

## 2013-11-30 MED ORDER — EZETIMIBE 10 MG PO TABS: ORAL_TABLET | ORAL | Status: DC

## 2013-12-02 ENCOUNTER — Other Ambulatory Visit: Payer: Self-pay

## 2013-12-10 ENCOUNTER — Other Ambulatory Visit: Payer: Self-pay

## 2013-12-10 MED ORDER — EZETIMIBE 10 MG PO TABS: ORAL_TABLET | ORAL | Status: DC

## 2013-12-29 ENCOUNTER — Telehealth: Payer: Self-pay | Admitting: Cardiology

## 2013-12-29 ENCOUNTER — Other Ambulatory Visit: Payer: Self-pay | Admitting: *Deleted

## 2013-12-29 MED ORDER — ROSUVASTATIN CALCIUM 10 MG PO TABS: 10.0000 mg | ORAL_TABLET | Freq: Every day | ORAL | Status: DC

## 2013-12-29 NOTE — Telephone Encounter (Signed)
Pt need a new prescription for his Crestor 10 mg #90 and refills please. Please send or call To [employer].

## 2014-01-06 ENCOUNTER — Other Ambulatory Visit: Payer: Self-pay | Admitting: Internal Medicine

## 2014-02-10 ENCOUNTER — Other Ambulatory Visit: Payer: Self-pay | Admitting: Internal Medicine

## 2014-04-24 ENCOUNTER — Other Ambulatory Visit: Payer: Self-pay | Admitting: Cardiology

## 2014-05-25 ENCOUNTER — Other Ambulatory Visit: Payer: Self-pay | Admitting: Internal Medicine

## 2014-06-15 DIAGNOSIS — E119 Type 2 diabetes mellitus without complications: Secondary | ICD-10-CM | POA: Diagnosis not present

## 2014-06-15 DIAGNOSIS — H01021 Squamous blepharitis right upper eyelid: Secondary | ICD-10-CM | POA: Diagnosis not present

## 2014-06-15 DIAGNOSIS — H2513 Age-related nuclear cataract, bilateral: Secondary | ICD-10-CM | POA: Diagnosis not present

## 2014-06-15 DIAGNOSIS — H01024 Squamous blepharitis left upper eyelid: Secondary | ICD-10-CM | POA: Diagnosis not present

## 2014-06-15 DIAGNOSIS — H01025 Squamous blepharitis left lower eyelid: Secondary | ICD-10-CM | POA: Diagnosis not present

## 2014-06-15 DIAGNOSIS — H01022 Squamous blepharitis right lower eyelid: Secondary | ICD-10-CM | POA: Diagnosis not present

## 2014-06-15 LAB — HM DIABETES EYE EXAM

## 2014-06-24 ENCOUNTER — Encounter: Payer: Self-pay | Admitting: Internal Medicine

## 2014-08-13 ENCOUNTER — Ambulatory Visit (INDEPENDENT_AMBULATORY_CARE_PROVIDER_SITE_OTHER): Payer: Medicare PPO | Admitting: Internal Medicine

## 2014-08-13 ENCOUNTER — Encounter: Payer: Self-pay | Admitting: Cardiology

## 2014-08-13 ENCOUNTER — Other Ambulatory Visit: Payer: Self-pay | Admitting: Internal Medicine

## 2014-08-13 ENCOUNTER — Ambulatory Visit (INDEPENDENT_AMBULATORY_CARE_PROVIDER_SITE_OTHER): Payer: Medicare PPO | Admitting: Cardiology

## 2014-08-13 ENCOUNTER — Encounter: Payer: Self-pay | Admitting: Internal Medicine

## 2014-08-13 ENCOUNTER — Telehealth: Payer: Self-pay | Admitting: Cardiology

## 2014-08-13 VITALS — BP 162/2 | HR 87 | Ht 72.0 in | Wt 220.6 lb

## 2014-08-13 VITALS — BP 128/88 | HR 144 | Temp 97.5°F | Resp 16 | Ht 72.0 in | Wt 220.0 lb

## 2014-08-13 DIAGNOSIS — I4892 Unspecified atrial flutter: Secondary | ICD-10-CM | POA: Diagnosis not present

## 2014-08-13 DIAGNOSIS — E1351 Other specified diabetes mellitus with diabetic peripheral angiopathy without gangrene: Secondary | ICD-10-CM

## 2014-08-13 DIAGNOSIS — E782 Mixed hyperlipidemia: Secondary | ICD-10-CM | POA: Diagnosis not present

## 2014-08-13 DIAGNOSIS — R000 Tachycardia, unspecified: Secondary | ICD-10-CM

## 2014-08-13 DIAGNOSIS — Z23 Encounter for immunization: Secondary | ICD-10-CM

## 2014-08-13 DIAGNOSIS — I483 Typical atrial flutter: Secondary | ICD-10-CM | POA: Diagnosis not present

## 2014-08-13 DIAGNOSIS — I25810 Atherosclerosis of coronary artery bypass graft(s) without angina pectoris: Secondary | ICD-10-CM

## 2014-08-13 DIAGNOSIS — I480 Paroxysmal atrial fibrillation: Secondary | ICD-10-CM | POA: Insufficient documentation

## 2014-08-13 DIAGNOSIS — E1365 Other specified diabetes mellitus with hyperglycemia: Secondary | ICD-10-CM

## 2014-08-13 DIAGNOSIS — IMO0002 Reserved for concepts with insufficient information to code with codable children: Secondary | ICD-10-CM

## 2014-08-13 DIAGNOSIS — Z8639 Personal history of other endocrine, nutritional and metabolic disease: Secondary | ICD-10-CM | POA: Diagnosis not present

## 2014-08-13 DIAGNOSIS — Z8739 Personal history of other diseases of the musculoskeletal system and connective tissue: Secondary | ICD-10-CM

## 2014-08-13 LAB — BASIC METABOLIC PANEL
BUN: 16 mg/dL (ref 6–23)
CHLORIDE: 100 meq/L (ref 96–112)
CO2: 26 mEq/L (ref 19–32)
Calcium: 10.1 mg/dL (ref 8.4–10.5)
Creat: 0.79 mg/dL (ref 0.50–1.35)
Glucose, Bld: 192 mg/dL — ABNORMAL HIGH (ref 70–99)
Potassium: 5 mEq/L (ref 3.5–5.3)
Sodium: 139 mEq/L (ref 135–145)

## 2014-08-13 LAB — CBC WITH DIFFERENTIAL/PLATELET
BASOS ABS: 0.1 10*3/uL (ref 0.0–0.1)
Basophils Relative: 1 % (ref 0–1)
EOS ABS: 0.2 10*3/uL (ref 0.0–0.7)
Eosinophils Relative: 2 % (ref 0–5)
HCT: 47.9 % (ref 39.0–52.0)
HEMOGLOBIN: 16.5 g/dL (ref 13.0–17.0)
LYMPHS ABS: 1.6 10*3/uL (ref 0.7–4.0)
Lymphocytes Relative: 21 % (ref 12–46)
MCH: 31.4 pg (ref 26.0–34.0)
MCHC: 34.4 g/dL (ref 30.0–36.0)
MCV: 91.2 fL (ref 78.0–100.0)
MONOS PCT: 10 % (ref 3–12)
MPV: 9.7 fL (ref 8.6–12.4)
Monocytes Absolute: 0.8 10*3/uL (ref 0.1–1.0)
Neutro Abs: 5.1 10*3/uL (ref 1.7–7.7)
Neutrophils Relative %: 66 % (ref 43–77)
Platelets: 313 10*3/uL (ref 150–400)
RBC: 5.25 MIL/uL (ref 4.22–5.81)
RDW: 13.4 % (ref 11.5–15.5)
WBC: 7.8 10*3/uL (ref 4.0–10.5)

## 2014-08-13 LAB — LIPID PANEL
Cholesterol: 146 mg/dL (ref 0–200)
HDL: 53 mg/dL (ref >=40)
LDL Cholesterol: 66 mg/dL (ref 0–99)
Total CHOL/HDL Ratio: 2.8 Ratio
Triglycerides: 136 mg/dL (ref <150)
VLDL: 27 mg/dL (ref 0–40)

## 2014-08-13 LAB — HEPATIC FUNCTION PANEL
ALT: 29 U/L (ref 0–53)
AST: 19 U/L (ref 0–37)
Albumin: 5 g/dL (ref 3.5–5.2)
Alkaline Phosphatase: 54 U/L (ref 39–117)
Bilirubin, Direct: 0.2 mg/dL (ref 0.0–0.3)
Indirect Bilirubin: 0.5 mg/dL (ref 0.2–1.2)
Total Bilirubin: 0.7 mg/dL (ref 0.2–1.2)
Total Protein: 8.1 g/dL (ref 6.0–8.3)

## 2014-08-13 LAB — TSH: TSH: 1.129 u[IU]/mL (ref 0.350–4.500)

## 2014-08-13 LAB — HEMOGLOBIN A1C
Hgb A1c MFr Bld: 8.7 % — ABNORMAL HIGH (ref <5.7)
MEAN PLASMA GLUCOSE: 203 mg/dL — AB (ref <117)

## 2014-08-13 MED ORDER — METOPROLOL SUCCINATE ER 25 MG PO TB24: 25.0000 mg | ORAL_TABLET | Freq: Every day | ORAL | Status: DC

## 2014-08-13 MED ORDER — RIVAROXABAN 20 MG PO TABS: 20.0000 mg | ORAL_TABLET | Freq: Every day | ORAL | Status: DC

## 2014-08-13 NOTE — Assessment & Plan Note (Signed)
A1c , urine microalbumin, BMET 

## 2014-08-13 NOTE — Progress Notes (Signed)
Pre visit review using our clinic review tool, if applicable. No additional management support is needed unless otherwise documented below in the visit note. 

## 2014-08-13 NOTE — Assessment & Plan Note (Signed)
Uric acid

## 2014-08-13 NOTE — Assessment & Plan Note (Addendum)
Stat Cardiology assessment as to admission.This appears advisable due to PMH of CAD and bypass Full TFTs, BMET, Mg++

## 2014-08-13 NOTE — Assessment & Plan Note (Signed)
Lipids, LFTs, TSH  

## 2014-08-13 NOTE — Telephone Encounter (Signed)
Spoke to [employer] ( [employer] -student) at Dr [employer] Patient is there now - in atrial flutter, rate 114-140 heart rate, asymtpomatic B/P 128/88,  ( Patient notice increase rate on his fitbit) Gae Bon wanted to know what Dr Doug Sou recomemendations. RN spoke to Dr Martinique Will see patient in office today - have patient to come to office now Gae Bon is aware-will tell the patient.

## 2014-08-13 NOTE — Patient Instructions (Signed)
Because of your history of coronary artery disease in patient management of the extremely rapid heart rate is clinically indicated. The labs that I had planned to do can be done while you're hospitalized.

## 2014-08-13 NOTE — Progress Notes (Signed)
Subjective:    Patient ID: Bryce Maldonado, male    DOB: January 19, 1941, 74 y.o.   MRN: [id number]  HPI He  is here to assess active health issues & conditions. He is asymptomatic &  denies persistent tachycardia until this am.  PMH, FH, & Social history verified & updated. He is on no specific diet but he does restrict carbs, particularly hyperglycemic white carbs. He eats fruits and vegetables and rarely eats red meat.  He's walking 30-40 minutes 7 days a week without associated cardiopulmonary symptoms. He  notes that with exercise pulse  will jump to 130-140 on his Fit Bit. His Cardiologist had discussed a beta blocker but he refuses this because of potential side effects.  He is not checking his sugars.   Review of Systems   Chest pain, palpitations, exertional dyspnea, paroxysmal nocturnal dyspnea, claudication or edema are absent. No unexplained weight loss, abdominal pain, significant dyspepsia, dysphagia, melena, rectal bleeding, or persistently small caliber stools. Dysuria, pyuria, hematuria, frequency, nocturia or polyuria are denied. Change in hair, skin, nails denied. No bowel changes of constipation or diarrhea. No intolerance to heat or cold.      Objective:   Physical Exam  Gen.: Adequately nourished in appearance. Alert, appropriate and cooperative throughout exam. Texting on cell phone. In no distress despite rate. Head: Normocephalic without obvious abnormalities; goatee & pattern alopecia  Eyes: No corneal or conjunctival inflammation noted. Pupils equal round reactive to light and accommodation. Extraocular motion intact.  Ears: External  ear exam reveals no significant lesions or deformities. Canals clear .TMs normal. Hearing is grossly normal bilaterally. Nose: External nasal exam reveals no deformity or inflammation. Nasal mucosa are pink and moist. No lesions or exudates noted.   Mouth: Oral mucosa and oropharynx reveal no lesions or exudates. Teeth in good  repair. Neck: No deformities, masses, or tenderness noted. Range of motion &Thyroid normal. Lungs: Normal respiratory effort; chest expands symmetrically. Lungs are clear to auscultation without rales, wheezes, or increased work of breathing. Heart: Tachycardia. Regular rhythm.No gallop, click, or rub. . Abdomen: Protuberant.Bowel sounds normal; abdomen soft and nontender. No masses, organomegaly or hernias noted. Genitalia:as per Dr Karsten Ro                                 Musculoskeletal/extremities: No deformity or scoliosis noted of  the thoracic or lumbar spine.  No clubbing, cyanosis, edema, or significant extremity  deformity noted.  Range of motion normal . Tone & strength normal. Hand joints normal Fingernail  health good.No onycholysis. Crepitus of knees  Able to lie down & sit up w/o help.  Negative SLR bilaterally Vascular: Carotid, radial artery, dorsalis pedis and  posterior tibial pulses are  equal. Slightly decreased pedal pulses.No bruits present. Neurologic: Alert and oriented x3. Deep tendon reflexes symmetrical and normal.  Gait normal        Skin: Intact without suspicious lesions or rashes. Lymph: No cervical, axillary lymphadenopathy present. Psych: Mood and affect are normal. Normally interactive  Assessment & Plan:  See Current Assessment & Plan in Problem List under specific Diagnosis New onset Atrial Flutter which is asymptomatic. Plan:as per Cardiology Labs were not ordered as it is anticipated he will be admitted.These can be done as in patient.

## 2014-08-13 NOTE — Patient Instructions (Signed)
Stop ASA  Start Toprol XL 25 mg daily for rate control and Xarelto 20 mg daily for blood thinner  We will check lab work today.   We will schedule you for an Echocardiogram  I will arrange a follow up visit in our Afib clinic

## 2014-08-13 NOTE — Progress Notes (Signed)
[facility] [address], [address], [place]  [address] Phone: [phone] Fax:  [phone]  Date:  08/13/2014   ID:  MELODY SAVIDGE, DOB [birth year], MRN [id number]  PCP:  [name], MD  Cardiologist:  Dr. Kali Deadwyler Martinique     History of Present Illness: Bryce Maldonado is a 74 y.o. male who is seen as a work in for evaluation of new onset atrial flutter.  He has a hx of CAD, s/p CABG in 1995, HL, prostate CA, DJD, spinal stenosis. Prior to left total hip replacement in 03/2010 he underwent a Lexiscan Myoview. This demonstrated an EF of 66% and inferior ischemia. LHC 01/2010: Proximal LAD 90%, proximal OM totally occluded, RCA occluded, SVG-PDA patent, SVG-diagonal patent, SVG-OM patent, LIMA-LAD patent. There were left to right collaterals. EF 60-65%. He was treated medically.   He was seen in Dr. Clayborn Heron office today for a routine physical. While there he noted his Fitbit recorded a HR in the 140s. An Ecg was done that showed atrial flutter with rate 141 with 2:1 AV conduction. He was completely asymptomatic. No complaints of chest pain, SOB, palpitations, or dizziness. No prior history of elevated HR. No history of TIA or CVA.   Wt Readings from Last 3 Encounters:  08/13/14 100.064 kg (220 lb 9.6 oz)  08/13/14 99.791 kg (220 lb)  10/21/13 100.517 kg (221 lb 9.6 oz)     Past Medical History  Diagnosis Date  . Spinal stenosis 2004    L4 radiculopathy, herniated nucleus pulposus L4-5  . Dyslipidemia   . Coronary artery disease     post CABG x4 -- in 1995 -- due to three-vessel coronary artery disease  . BPH (benign prostatic hyperplasia)     Elevated PSA  . Anemia   . Gout   . Diverticular disease   . Childhood asthma   . Osteoarthritis   . Tinnitus   . Prostate cancer     Dr Karsten Ro    Current Outpatient Prescriptions  Medication Sig Dispense Refill  . allopurinol (ZYLOPRIM) 300 MG tablet TAKE 1 TABLET EVERY DAY 90 tablet 1  . fexofenadine (ALLEGRA) 180 MG  tablet Take 180 mg by mouth every morning.     . Multiple Vitamins-Minerals (CENTRUM PO) Take 1 tablet by mouth daily.     . Nutritional Supplements (MELATONIN PO) Take 1 each by mouth at bedtime.      . rosuvastatin (CRESTOR) 10 MG tablet Take 1 tablet (10 mg total) by mouth daily. 90 tablet 2  . venlafaxine XR (EFFEXOR-XR) 75 MG 24 hr capsule TAKE 1 CAPSULE EVERY DAY 90 capsule 1  . ZETIA 10 MG tablet TAKE 1 TABLET EVERY DAY 90 tablet 1  . metFORMIN (GLUCOPHAGE) 500 MG tablet TAKE 1 TABLET BY MOUTH TWICE DAILY WITH A MEAL 60 tablet 5  . metoprolol succinate (TOPROL XL) 25 MG 24 hr tablet Take 1 tablet (25 mg total) by mouth daily. 30 tablet 3  . rivaroxaban (XARELTO) 20 MG TABS tablet Take 1 tablet (20 mg total) by mouth daily with supper. 30 tablet 3   No current facility-administered medications for this visit.    Allergies:    Allergies  Allergen Reactions  . Naproxen Sodium     rash    Social History:  The patient  reports that he quit smoking about 44 years ago. He has never used smokeless tobacco. He reports that he drinks about 2.0 oz of alcohol per week. He reports that he does  not use illicit drugs.   ROS:  Please see the history of present illness.      All other systems reviewed and negative.   PHYSICAL EXAM: VS:  BP 162/2 mmHg  Pulse 87  Ht 6' (1.829 m)  Wt 100.064 kg (220 lb 9.6 oz)  BMI 29.91 kg/m2 Well nourished, well developed, in no acute distress HEENT: normal Neck: no JVD Vascular:  No carotid bruits Cardiac: IRRR,  normal S1, S2; RRR; no murmur Lungs:  clear to auscultation bilaterally, no wheezing, rhonchi or rales Abd: soft, nontender, no hepatomegaly Ext: no edema Skin: warm and dry Neuro:  CNs 2-12 intact, no focal abnormalities noted  EKG:  Atrial flutter with rate 87 bpm with variable AV conduction. , NSSTTW changes. I have personally reviewed and interpreted this study.   ASSESSMENT AND PLAN:  1. CAD: s/p CABG 1995.  He is active without  anginal symptoms.  He had a LHC in 2012 that demonstrated patent bypass grafts.  Continue medical Rx.  Continue  statin.  2. Atrial flutter- new onset. Initially with RVR but now HR controlled at rest. [place]- vasc score of 2. Recommend anticoagulation and will start Xarelto 20 mg daily. will add Toprol XL 25 mg daily. Will check complete blood work today including chemistries, CBC, TSH, and lipids. will arrange for an Echo. Will arrange follow up in Afib clinic in 2 weeks. He may convert to NSR on his own but if not may consider DCCV versus ablation. I will follow up in 3 months.  3. Hyperlipidemia:  Check fasting labs.   Continue current Rx.   Signed, Zabian Swayne Martinique MD, [facility]

## 2014-08-18 ENCOUNTER — Ambulatory Visit (HOSPITAL_COMMUNITY)
Admission: RE | Admit: 2014-08-18 | Discharge: 2014-08-18 | Disposition: A | Discharge status: Home / Self Care | Payer: Medicare PPO | Source: Ambulatory Visit | Attending: Cardiovascular Disease | Admitting: Cardiovascular Disease

## 2014-08-18 DIAGNOSIS — E782 Mixed hyperlipidemia: Secondary | ICD-10-CM | POA: Diagnosis not present

## 2014-08-18 DIAGNOSIS — I483 Typical atrial flutter: Secondary | ICD-10-CM | POA: Diagnosis not present

## 2014-08-18 DIAGNOSIS — I4892 Unspecified atrial flutter: Secondary | ICD-10-CM | POA: Diagnosis not present

## 2014-08-18 DIAGNOSIS — I25810 Atherosclerosis of coronary artery bypass graft(s) without angina pectoris: Secondary | ICD-10-CM | POA: Diagnosis not present

## 2014-08-18 DIAGNOSIS — E785 Hyperlipidemia, unspecified: Secondary | ICD-10-CM | POA: Diagnosis not present

## 2014-08-18 DIAGNOSIS — I2510 Atherosclerotic heart disease of native coronary artery without angina pectoris: Secondary | ICD-10-CM | POA: Diagnosis not present

## 2014-08-20 ENCOUNTER — Encounter: Payer: Self-pay | Admitting: Internal Medicine

## 2014-08-20 ENCOUNTER — Ambulatory Visit (INDEPENDENT_AMBULATORY_CARE_PROVIDER_SITE_OTHER): Payer: Medicare PPO | Admitting: Internal Medicine

## 2014-08-20 VITALS — BP 160/90 | HR 79 | Temp 98.4°F | Resp 14 | Wt 220.0 lb

## 2014-08-20 DIAGNOSIS — I480 Paroxysmal atrial fibrillation: Secondary | ICD-10-CM

## 2014-08-20 DIAGNOSIS — I10 Essential (primary) hypertension: Secondary | ICD-10-CM | POA: Diagnosis not present

## 2014-08-20 DIAGNOSIS — IMO0002 Reserved for concepts with insufficient information to code with codable children: Secondary | ICD-10-CM

## 2014-08-20 DIAGNOSIS — E1351 Other specified diabetes mellitus with diabetic peripheral angiopathy without gangrene: Secondary | ICD-10-CM | POA: Diagnosis not present

## 2014-08-20 DIAGNOSIS — E1365 Other specified diabetes mellitus with hyperglycemia: Principal | ICD-10-CM

## 2014-08-20 MED ORDER — GLIMEPIRIDE 2 MG PO TABS: 2.0000 mg | ORAL_TABLET | Freq: Every day | ORAL | Status: DC

## 2014-08-20 NOTE — Progress Notes (Signed)
Pre visit review using our clinic review tool, if applicable. No additional management support is needed unless otherwise documented below in the visit note. 

## 2014-08-20 NOTE — Patient Instructions (Signed)
A1c assesses average 24 hour  glucose over prior 6-12 weeks.  No Diabetes risk if < 6.1%  "Pre Diabetes" :6.2-6.4 % Good diabetic control: 6.5-7 % Fair diabetic control: 7-8 % Poor diabetic control: greater than 8 % ( except with additional factors such as  advanced age; significant coronary or neurologic disease,etc).  Your present value is 8.7 %. An  A1c of 8 % or less  is the safest goal for you.Preferred is < 7 % as long as there are no low blood glucose events.  Goals for home glucose monitoring are : fasting  or morning glucose goal of  100-150. Ninety minutes after any meal , goal = < 180, preferably < 160. Report any low blood glucoses immediately.   The following nutritional changes may help prevent Diabetes progression & complications.  White carbohydrates (potatoes, rice, bread, and pasta) cause a high spike of the sugar level which stays elevated for a significant period of time (called sugar"load").  For example a  baked potato has a cup of sugar and a  french fry  2 teaspoons of sugar.  More complex carbs such as yams, wild  rice, whole grained bread &  wheat pasta have been much lower spike and persistent load of sugar than the white carbs. The pancreas excretes excess insulin in response to the high spike & load of sugar . Over time the pancreas can actually run out of insulin necessitating insulin shots.

## 2014-08-20 NOTE — Progress Notes (Signed)
   Subjective:    Patient ID: Bryce Maldonado, male    DOB: 18-Apr-1940, 74 y.o.   MRN: [id number]  HPI At the appointment for his physical 08/13/14 he was found to have atrial flutter and was sent to Cardiology. They did start low-dose beta blocker. He is on the [facility]. He has been checking the pulse on a Fit bit.  Labs were drawn at the [facility] office. This revealed an A1c of 8.7%. He has not started checking sugars yet.  He denies active cardiopulmonary or diabetic symptoms.  Review of Systems   Chest pain, palpitations,exertional dyspnea, paroxysmal nocturnal dyspnea, claudication or edema are absent.  He denies polydipsia, polyphagia, or polyuria. He has no nonhealing skin lesions. He is not having numbness, tingling,or weakness in extremities.  On Xarelto epistaxis, hemoptysis, hematuria, melena, or rectal bleeding denied. No unexplained weight loss, significant dyspepsia,dysphagia, or abdominal pain.  There is no abnormal bruising , bleeding, or difficulty stopping bleeding with injury.      Objective:   Physical Exam  Pertinent or positive findings include:  Pulse is very irregular & is as high as 102. Repeat blood pressure was 160/90.  General appearance :adequately nourished; in no distress.  Eyes: No conjunctival inflammation or scleral icterus is present.  Heart:  No gallop, murmur, click, rub or other extra sounds    Lungs:Chest clear to auscultation; no wheezes, rhonchi,rales ,or rubs present.No increased work of breathing.   Abdomen: Protuberant;bowel sounds normal, soft and non-tender without masses, organomegaly or hernias noted.  No guarding or rebound.   Vascular : all pulses equal ; no bruits present.  Skin:Warm & dry.  Intact without suspicious lesions or rashes ; no tenting or jaundice   Lymphatic: No lymphadenopathy is noted about the head, neck, axilla.   Neuro: Strength, tone  normal.        Assessment & Plan:  #1  uncontrolled diabetes; risk  (> 75%) of glucose toxicity discussed in detail. Generic Amaryl will be added. Insulin declined.  #2 uncontrolled hypertension  #3 atrial fibrillation  Plan Toprol was increased to 2 daily until he follows up with his Cardiologist

## 2014-08-27 ENCOUNTER — Encounter (HOSPITAL_COMMUNITY): Payer: Self-pay | Admitting: Nurse Practitioner

## 2014-08-27 ENCOUNTER — Other Ambulatory Visit: Payer: Self-pay

## 2014-08-27 ENCOUNTER — Ambulatory Visit (HOSPITAL_COMMUNITY)
Admission: RE | Admit: 2014-08-27 | Discharge: 2014-08-27 | Disposition: A | Discharge status: Home / Self Care | Payer: Medicare PPO | Source: Ambulatory Visit | Attending: Nurse Practitioner | Admitting: Nurse Practitioner

## 2014-08-27 VITALS — BP 128/72 | HR 82 | Ht 72.0 in | Wt 223.6 lb

## 2014-08-27 DIAGNOSIS — I4891 Unspecified atrial fibrillation: Secondary | ICD-10-CM | POA: Diagnosis not present

## 2014-08-27 DIAGNOSIS — I483 Typical atrial flutter: Secondary | ICD-10-CM

## 2014-08-28 ENCOUNTER — Encounter (HOSPITAL_COMMUNITY): Payer: Self-pay | Admitting: Nurse Practitioner

## 2014-08-28 ENCOUNTER — Telehealth (HOSPITAL_COMMUNITY): Payer: Self-pay | Admitting: Nurse Practitioner

## 2014-08-28 NOTE — Progress Notes (Signed)
Patient ID: Bryce Maldonado, male   DOB: November 11, 1940, 74 y.o.   MRN: [id number]    Primary Care Physician: Bryce Cobble, MD Referring Physician: Dr. Martinique   Bryce Maldonado is a 74 y.o. male with a h/o asymptomatic  new onset aflutter, referred from Dr. Martinique.CAD, s/p CABG in 1995, HL, prostate CA, DJD, spinal stenosis. Prior to left total hip replacement in 03/2010 he underwent a Lexiscan Myoview. This demonstrated an EF of 66% and inferior ischemia. LHC 01/2010: Proximal LAD 90%, proximal OM totally occluded, RCA occluded, SVG-PDA patent, SVG-diagonal patent, SVG-OM patent, LIMA-LAD patent. There were left to right collaterals. EF 60-65%. He was treated medically.   He was seen in Dr. Clayborn Heron office 7/14 for a routine physical. While there he noted his Fitbit recorded a HR in the 140s. An Ecg was done that showed atrial flutter with rate 141 with 2:1 AV conduction. He was completely asymptomatic. No complaints of chest pain, SOB, palpitations, or dizziness. No prior history of elevated HR. No history of TIA or CVA.  He is being seen in the afib clinc for follow up from Dr. Martinique.  He was  previously started on xarelto for chadsvasc of at least 3(age,cad, DM) and metoprolol 25 mg and is tolerating both well. He is still unaware of any symptoms related to aflutter. Denies any tobacco/alcohol/caffeine/snoring. Walks on a regular basis.  Today, he denies symptoms of palpitations, chest pain, shortness of breath, orthopnea, PND, lower extremity edema, dizziness, presyncope, syncope, or neurologic sequela. The patient is tolerating medications without difficulties and is otherwise without complaint today.   Past Medical History  Diagnosis Date  . Spinal stenosis 2004    L4 radiculopathy, herniated nucleus pulposus L4-5  . Dyslipidemia   . Coronary artery disease     post CABG x4 -- in 1995 -- due to three-vessel coronary artery disease  . BPH (benign prostatic hyperplasia)     Elevated PSA  .  Anemia   . Gout   . Diverticular disease   . Childhood asthma   . Osteoarthritis   . Tinnitus   . Prostate cancer     Dr Karsten Ro   Past Surgical History  Procedure Laterality Date  . Salivary gland surgery  2001    [place]  . Appendectomy    . Coronary artery bypass graft  10/21/1993    x4 -- using the LIM artery graft to the LAD artery, with saphenous vein grafts to the diagonal branch of the LAD, OM branch the left circumflex coronary artery, and the posterior descending branch of the RCA -- Est. EF of 65%-- Surgeon: Gaye Pollack, M.D.              . Colonoscopy      negative X3; Dr Carlean Purl  . Prostate biopsy  10/2008  . Total hip arthroplasty  November 2010    right  . Microdiscectomy lumbar  2004    L4-5, Dr. Arnoldo Morale  . Flexible sigmoidoscopy  2005     external hemorrhoids  . Cardiac catheterization  02/10/2010    Est. EF of 60-65% -- Severe three-vessel obstructive atherosclerotic coronary artery disease -- All grafts were patent including left internal mammary artery graft to left anterior descending coronary artery, saphenous vein graft to the diagonal, saphenous vein graft to obtuse marginal vessel, and saphenous vein graft to the posterior descending coronary artery -- Normal left ventricular function   . Total hip arthroplasty  03/2010    L;Dr Alvan Dame  Current Outpatient Prescriptions  Medication Sig Dispense Refill  . allopurinol (ZYLOPRIM) 300 MG tablet TAKE 1 TABLET EVERY DAY 90 tablet 1  . fexofenadine (ALLEGRA) 180 MG tablet Take 180 mg by mouth every morning.     [name] glimepiride (AMARYL) 2 MG tablet Take 1 tablet (2 mg total) by mouth daily before breakfast. 30 tablet 3  . metFORMIN (GLUCOPHAGE) 500 MG tablet TAKE 1 TABLET BY MOUTH TWICE DAILY WITH A MEAL 60 tablet 5  . metoprolol succinate (TOPROL XL) 25 MG 24 hr tablet Take 25 mg by mouth daily.  30 tablet 3  . Multiple Vitamins-Minerals (CENTRUM PO) Take 1 tablet by mouth daily.     . Nutritional Supplements  (MELATONIN PO) Take 1 each by mouth at bedtime.      . rivaroxaban (XARELTO) 20 MG TABS tablet Take 1 tablet (20 mg total) by mouth daily with supper. 30 tablet 3  . rosuvastatin (CRESTOR) 10 MG tablet Take 1 tablet (10 mg total) by mouth daily. 90 tablet 2  . venlafaxine XR (EFFEXOR-XR) 75 MG 24 hr capsule TAKE 1 CAPSULE EVERY DAY 90 capsule 1  . ZETIA 10 MG tablet TAKE 1 TABLET EVERY DAY 90 tablet 1   No current facility-administered medications for this encounter.    Allergies  Allergen Reactions  . Naproxen Sodium     rash    History   Social History  . Marital Status: Married    Spouse Name: N/A  . Number of Children: N/A  . Years of Education: N/A   Occupational History  . Not on file.   Social History Main Topics  . Smoking status: Former Smoker -- 2.50 packs/day for 15 years    Quit date: 01/30/1970  . Smokeless tobacco: Never Used     Comment: smoked 1959-1972, up to 1.5 ppd  . Alcohol Use: 2.0 oz/week    4 drink(s) per week  . Drug Use: No  . Sexual Activity: Not on file   Other Topics Concern  . Not on file   Social History Narrative    Family History  Problem Relation Age of Onset  . Diabetes Father   . Hypertension Father   . Lung cancer Father     smoker  . Heart failure Mother 57  . Hyperlipidemia Sister   . Coronary artery disease Sister   . Heart attack Paternal Aunt     X 2; both > 65  . Colon cancer Neg Hx   . Stroke Neg Hx     ROS- All systems are reviewed and negative except as per the HPI above  Physical Exam: Filed Vitals:   08/27/14 1447  BP: 128/72  Pulse: 82  Height: 6' (1.829 m)  Weight: 223 lb 9.6 oz (101.424 kg)    GEN- The patient is well appearing, alert and oriented x 3 today.   Head- normocephalic, atraumatic Eyes-  Sclera clear, conjunctiva pink Ears- hearing intact Oropharynx- clear Neck- supple, no JVP Lymph- no cervical lymphadenopathy Lungs- Clear to ausculation bilaterally, normal work of  breathing Heart- Regular rate and rhythm, no murmurs, rubs or gallops, PMI not laterally displaced GI- soft, NT, ND, + BS Extremities- no clubbing, cyanosis, or edema MS- no significant deformity or atrophy Skin- no rash or lesion Psych- euthymic mood, full affect Neuro- strength and sensation are intact  EKG-Atrial flutter 82 bpm, st/t wave abnormality  Echo- Left ventricle: The cavity size was normal. Wall thickness was increased in a pattern of moderate LVH. Systolic  function was normal. The estimated ejection fraction was in the range of 60% to 65%. - Aortic valve: AV is thickened, calcified with minimally restricted motion Peak and mean gradients through the valve are 15 and 9 mm Hg respectively. Valve area (VTI): 1.75 cm^2. Valve area (Vmax): 1.67 cm^2. Valve area (Vmean): 1.66 cm^2. - Mitral valve: Calcified annulus. Mildly thickened leaflets . There was mild regurgitation. Valve area by continuity equation (using LVOT flow): 1.84 cm^2. Left atrium 52 mm  139 140 137 138       Potassium 3.5 - 5.3 mEq/L 5.0 4.9R 4.2R 4.4R    Chloride 96 - 112 mEq/L 100 103 103 103    CO2 19 - 32 mEq/L 26 22 25 26     Glucose, Bld 70 - 99 mg/dL 192 (H) 125 (H) 189 (H) 190 (H)    BUN 6 - 23 mg/dL 16 17 17 21     Creat 0.50 - 1.35 mg/dL 0.79 0.8R 0.8R 0.9R    Calcium 8.4 - 10.5 mg/dL 10.1            Assessment and Plan: 1. Asymptomatic  typical aflutter  On anticoagulation but not yet fully anticoagulated for a couple more weeks. Will discuss with Dr. Rayann Heman for best approach. Aflutter ablation or DCCV Will arrange for appropriate f/u per his suggestion. Continue with metoprolol for rate control.  2. CAD Appears stable  3. DM Newly diagnosed with a A1c of 8.7% Has been seen by Dr. Linna Darner and started on appropriate meds Is modifying diet   Butch Penny C. Tomeka Kantner, [facility] [address] [place], [place]  [address] [phone]

## 2014-08-28 NOTE — Telephone Encounter (Signed)
Pt aware that I discussed with Dr. Rayann Heman best way to go as far as DCCV vrs ablation is concerned and he thinks a. flutter ablation is reasonable to pursue. Pt is aware that someone should contact the office with an appointment.  Geroge Baseman Carroll, [facility] [address] [place], [place] [address] [phone]

## 2014-09-02 ENCOUNTER — Ambulatory Visit (HOSPITAL_COMMUNITY): Payer: Medicare PPO | Admitting: Nurse Practitioner

## 2014-09-09 ENCOUNTER — Telehealth: Payer: Self-pay | Admitting: *Deleted

## 2014-09-09 MED ORDER — ONETOUCH DELICA LANCETS 33G MISC: Status: DC

## 2014-09-09 MED ORDER — GLUCOSE BLOOD VI STRP: 1.0000 | ORAL_STRIP | Freq: Two times a day (BID) | Status: DC

## 2014-09-09 NOTE — Telephone Encounter (Signed)
Pt states he is needing testing supplies for his one touch verio. Verified pharmacy and how mant times he test a day. Inform pt will send to [facility]...Bryce Maldonado

## 2014-09-14 ENCOUNTER — Other Ambulatory Visit: Payer: Self-pay | Admitting: Cardiology

## 2014-09-15 DIAGNOSIS — Z8546 Personal history of malignant neoplasm of prostate: Secondary | ICD-10-CM | POA: Diagnosis not present

## 2014-09-18 ENCOUNTER — Other Ambulatory Visit: Payer: Self-pay | Admitting: Cardiology

## 2014-09-22 DIAGNOSIS — Z8546 Personal history of malignant neoplasm of prostate: Secondary | ICD-10-CM | POA: Diagnosis not present

## 2014-09-30 ENCOUNTER — Encounter: Payer: Self-pay | Admitting: Internal Medicine

## 2014-09-30 ENCOUNTER — Ambulatory Visit (INDEPENDENT_AMBULATORY_CARE_PROVIDER_SITE_OTHER): Payer: Medicare PPO | Admitting: Internal Medicine

## 2014-09-30 ENCOUNTER — Encounter: Payer: Self-pay | Admitting: *Deleted

## 2014-09-30 VITALS — BP 100/70 | HR 139 | Ht 72.0 in | Wt 221.4 lb

## 2014-09-30 DIAGNOSIS — I483 Typical atrial flutter: Secondary | ICD-10-CM

## 2014-09-30 LAB — CBC WITH DIFFERENTIAL/PLATELET
BASOS PCT: 0.7 % (ref 0.0–3.0)
Basophils Absolute: 0.1 10*3/uL (ref 0.0–0.1)
EOS ABS: 0.2 10*3/uL (ref 0.0–0.7)
Eosinophils Relative: 2.6 % (ref 0.0–5.0)
HEMATOCRIT: 43.5 % (ref 39.0–52.0)
Hemoglobin: 14.9 g/dL (ref 13.0–17.0)
LYMPHS ABS: 1.4 10*3/uL (ref 0.7–4.0)
LYMPHS PCT: 19.2 % (ref 12.0–46.0)
MCHC: 34.1 g/dL (ref 30.0–36.0)
MCV: 92.1 fl (ref 78.0–100.0)
MONOS PCT: 10.1 % (ref 3.0–12.0)
Monocytes Absolute: 0.7 10*3/uL (ref 0.1–1.0)
NEUTROS ABS: 4.8 10*3/uL (ref 1.4–7.7)
NEUTROS PCT: 67.4 % (ref 43.0–77.0)
PLATELETS: 293 10*3/uL (ref 150.0–400.0)
RBC: 4.73 Mil/uL (ref 4.22–5.81)
RDW: 13.3 % (ref 11.5–15.5)
WBC: 7.2 10*3/uL (ref 4.0–10.5)

## 2014-09-30 LAB — BASIC METABOLIC PANEL
BUN: 21 mg/dL (ref 6–23)
CHLORIDE: 104 meq/L (ref 96–112)
CO2: 25 meq/L (ref 19–32)
CREATININE: 0.8 mg/dL (ref 0.40–1.50)
Calcium: 9.9 mg/dL (ref 8.4–10.5)
GFR: 100.72 mL/min (ref >60.00)
GLUCOSE: 137 mg/dL — AB (ref 70–99)
Potassium: 4 mEq/L (ref 3.5–5.1)
Sodium: 137 mEq/L (ref 135–145)

## 2014-09-30 NOTE — Progress Notes (Signed)
Electrophysiology Office Note   Date:  09/30/2014   ID:  PAULETTE LYNCH, DOB [birth year], MRN [id number]  PCP:  [name], MD  Cardiologist:  Dr Martinique Primary Electrophysiologist: Thompson Grayer, MD    Chief Complaint  Patient presents with  . Atrial Flutter     History of Present Illness: Bryce Maldonado is a 74 y.o. male who presents today for electrophysiology evaluation.   He recently presented to Dr Linna Darner for routine physical 08/13/14 and was found to have a rapid pulse.  EKG revealed atrial flutter.  He was referred to the AF clinic where he was diagnosed with atrial flutter and initiated on xarelto.  He reports compliance with this medicine without interruption.  Denies bleeding.  He was also placed on metoprolol.  He is unaware of atrial flutter.  Today, he denies symptoms of palpitations, chest pain, shortness of breath, orthopnea, PND, lower extremity edema, claudication, dizziness, presyncope, syncope, bleeding, or neurologic sequela. The patient is tolerating medications without difficulties and is otherwise without complaint today.    Past Medical History  Diagnosis Date  . Spinal stenosis 2004    L4 radiculopathy, herniated nucleus pulposus L4-5  . Dyslipidemia   . Coronary artery disease     post CABG x4 -- in 1995 -- due to three-vessel coronary artery disease  . BPH (benign prostatic hyperplasia)     Elevated PSA  . Anemia   . Gout   . Diverticular disease   . Childhood asthma   . Osteoarthritis   . Tinnitus   . Prostate cancer     Dr Karsten Ro  . Typical atrial flutter   . Diabetes    Past Surgical History  Procedure Laterality Date  . Salivary gland surgery  2001    [place]  . Appendectomy    . Coronary artery bypass graft  10/21/1993    x4 -- using the LIM artery graft to the LAD artery, with saphenous vein grafts to the diagonal branch of the LAD, OM branch the left circumflex coronary artery, and the posterior descending branch of the RCA --  Est. EF of 65%-- Surgeon: Gaye Pollack, M.D.              . Colonoscopy      negative X3; Dr Carlean Purl  . Prostate biopsy  10/2008  . Total hip arthroplasty  November 2010    right  . Microdiscectomy lumbar  2004    L4-5, Dr. Arnoldo Morale  . Flexible sigmoidoscopy  2005     external hemorrhoids  . Cardiac catheterization  02/10/2010    Est. EF of 60-65% -- Severe three-vessel obstructive atherosclerotic coronary artery disease -- All grafts were patent including left internal mammary artery graft to left anterior descending coronary artery, saphenous vein graft to the diagonal, saphenous vein graft to obtuse marginal vessel, and saphenous vein graft to the posterior descending coronary artery -- Normal left ventricular function   . Total hip arthroplasty  03/2010    L;Dr Alvan Dame     Current Outpatient Prescriptions  Medication Sig Dispense Refill  . allopurinol (ZYLOPRIM) 300 MG tablet Take 300 mg by mouth daily.    [name] ezetimibe (ZETIA) 10 MG tablet Take 10 mg by mouth daily.    . fexofenadine (ALLEGRA) 180 MG tablet Take 180 mg by mouth every morning.     [name] glimepiride (AMARYL) 2 MG tablet Take 1 tablet (2 mg total) by mouth daily before breakfast. 30 tablet 3  . glucose blood (ONETOUCH  VERIO) test strip 1 each by Other route 2 (two) times daily. Use to check  Blood sugars twice a day Dx E11.9 100 each 3  . metFORMIN (GLUCOPHAGE) 500 MG tablet TAKE 1 TABLET BY MOUTH TWICE DAILY WITH A MEAL 60 tablet 5  . metoprolol succinate (TOPROL XL) 25 MG 24 hr tablet Take 25 mg by mouth daily.  30 tablet 3  . Multiple Vitamins-Minerals (CENTRUM PO) Take 1 tablet by mouth daily.     . Nutritional Supplements (MELATONIN PO) Take 1 each by mouth at bedtime.      Glory Rosebush DELICA LANCETS 21H MISC Use to help check blood sugars twice a day Dx E11.9 100 each 3  . rivaroxaban (XARELTO) 20 MG TABS tablet Take 1 tablet (20 mg total) by mouth daily with supper. 30 tablet 3  . rosuvastatin (CRESTOR) 10 MG tablet  Take 10 mg by mouth daily.    [name] venlafaxine XR (EFFEXOR-XR) 75 MG 24 hr capsule Take 75 mg by mouth daily.     No current facility-administered medications for this visit.    Allergies:   Naproxen sodium   Social History:  The patient  reports that he quit smoking about 44 years ago. He has never used smokeless tobacco. He reports that he drinks about 2.4 oz of alcohol per week. He reports that he does not use illicit drugs.   Family History:  The patient's  family history includes Coronary artery disease in his sister; Diabetes in his father; Heart attack in his paternal aunt; Heart failure (age of onset: 41) in his mother; Hyperlipidemia in his sister; Hypertension in his father; Lung cancer in his father. There is no history of Colon cancer or Stroke.    ROS:  Please see the history of present illness.   All other systems are reviewed and negative.    PHYSICAL EXAM: VS:  BP 100/70 mmHg  Pulse 139  Ht 6' (1.829 m)  Wt 100.426 kg (221 lb 6.4 oz)  BMI 30.02 kg/m2 , BMI Body mass index is 30.02 kg/(m^2). GEN: Well nourished, well developed, in no acute distress HEENT: normal Neck: no JVD, carotid bruits, or masses Cardiac: iRRR; no murmurs, rubs, or gallops,no edema  Respiratory:  clear to auscultation bilaterally, normal work of breathing GI: soft, nontender, nondistended, + BS MS: no deformity or atrophy Skin: warm and dry  Neuro:  Strength and sensation are intact Psych: euthymic mood, full affect  EKG:  EKG is ordered today. The ekg ordered today shows atrial flutter with 2:1 AV conduction   Recent Labs: 08/13/2014: ALT 29; BUN 16; Creat 0.79; Hemoglobin 16.5; Platelets 313; Potassium 5.0; Sodium 139; TSH 1.129    Lipid Panel     Component Value Date/Time   CHOL 146 08/13/2014 1149   TRIG 136 08/13/2014 1149   TRIG 99 12/05/2005 0938   HDL 53 08/13/2014 1149   CHOLHDL 2.8 08/13/2014 1149   CHOLHDL 2.4 CALC 12/05/2005 0938   VLDL 27 08/13/2014 1149   LDLCALC 66  08/13/2014 1149   LDLDIRECT 86.2 06/24/2010 1023     Wt Readings from Last 3 Encounters:  09/30/14 100.426 kg (221 lb 6.4 oz)  08/27/14 101.424 kg (223 lb 9.6 oz)  08/20/14 99.791 kg (220 lb)      Other studies Reviewed: Additional studies/ records that were reviewed today include: AF clinic notes, Dr Morrison Old notes, echo    ASSESSMENT AND PLAN:  1.  Typical atrial flutter The patient has sustained typical atrial flutter  chads2vasc score is at least 3.  He is appropriately anticoagulated with xarelto. V rates have been poorly controlled, also placing him at risk for CHF over time. Therapeutic strategies for atrial flutter including medicine and ablation were discussed in detail with the patient today. Risk, benefits, and alternatives to EP study and radiofrequency ablation were also discussed in detail today. These risks include but are not limited to stroke, bleeding, vascular damage, tamponade, perforation, damage to the heart and other structures, AV block requiring pacemaker, worsening renal function, and death. The patient understands these risk and wishes to proceed.  We will therefore proceed with catheter ablation at the next available time.  Continue anticoagulation in the interim.  2. CAD Stable No change required today  Current medicines are reviewed at length with the patient today.   The patient does not have concerns regarding his medicines.  The following changes were made today:  none  Signed, Thompson Grayer, MD  09/30/2014 9:30 AM     [facility] [address] [place] [address] [phone] (office) [phone] (fax)

## 2014-09-30 NOTE — Patient Instructions (Signed)
Medication Instructions:  Your physician recommends that you continue on your current medications as directed. Please refer to the Current Medication list given to you today.   Labwork: Your physician recommends that you return for lab work today: BMP/CBC   Testing/Procedures: Your physician has recommended that you have an ablation. Catheter ablation is a medical procedure used to treat some cardiac arrhythmias (irregular heartbeats). During catheter ablation, a long, thin, flexible tube is put into a blood vessel in your groin (upper thigh), or neck. This tube is called an ablation catheter. It is then guided to your heart through the blood vessel. Radio frequency waves destroy small areas of heart tissue where abnormal heartbeats may cause an arrhythmia to start. Please see the instruction sheet given to you today.    Follow-Up: Your physician recommends that you schedule a follow-up appointment in: 4 weeks with Chanetta Marshall, NP and 3 months with Dr Rayann Heman   Any Other Special Instructions Will Be Listed Below (If Applicable).

## 2014-10-01 ENCOUNTER — Ambulatory Visit (HOSPITAL_COMMUNITY): Payer: Medicare PPO | Admitting: Anesthesiology

## 2014-10-01 ENCOUNTER — Encounter (HOSPITAL_COMMUNITY): Payer: Self-pay | Admitting: Anesthesiology

## 2014-10-01 ENCOUNTER — Encounter (HOSPITAL_COMMUNITY)
Admission: RE | Disposition: A | Discharge status: Home / Self Care | Payer: Self-pay | Source: Ambulatory Visit | Attending: Internal Medicine

## 2014-10-01 ENCOUNTER — Ambulatory Visit (HOSPITAL_COMMUNITY)
Admission: RE | Admit: 2014-10-01 | Discharge: 2014-10-01 | Disposition: A | Discharge status: Home / Self Care | Payer: Medicare PPO | Source: Ambulatory Visit | Attending: Internal Medicine | Admitting: Internal Medicine

## 2014-10-01 DIAGNOSIS — Z8546 Personal history of malignant neoplasm of prostate: Secondary | ICD-10-CM | POA: Diagnosis not present

## 2014-10-01 DIAGNOSIS — E785 Hyperlipidemia, unspecified: Secondary | ICD-10-CM | POA: Diagnosis not present

## 2014-10-01 DIAGNOSIS — D649 Anemia, unspecified: Secondary | ICD-10-CM | POA: Diagnosis not present

## 2014-10-01 DIAGNOSIS — I4891 Unspecified atrial fibrillation: Secondary | ICD-10-CM | POA: Insufficient documentation

## 2014-10-01 DIAGNOSIS — N400 Enlarged prostate without lower urinary tract symptoms: Secondary | ICD-10-CM | POA: Insufficient documentation

## 2014-10-01 DIAGNOSIS — I4892 Unspecified atrial flutter: Secondary | ICD-10-CM | POA: Diagnosis not present

## 2014-10-01 DIAGNOSIS — Z951 Presence of aortocoronary bypass graft: Secondary | ICD-10-CM | POA: Insufficient documentation

## 2014-10-01 DIAGNOSIS — I10 Essential (primary) hypertension: Secondary | ICD-10-CM | POA: Diagnosis not present

## 2014-10-01 DIAGNOSIS — E119 Type 2 diabetes mellitus without complications: Secondary | ICD-10-CM | POA: Diagnosis not present

## 2014-10-01 DIAGNOSIS — I483 Typical atrial flutter: Secondary | ICD-10-CM

## 2014-10-01 DIAGNOSIS — I480 Paroxysmal atrial fibrillation: Secondary | ICD-10-CM

## 2014-10-01 DIAGNOSIS — I2510 Atherosclerotic heart disease of native coronary artery without angina pectoris: Secondary | ICD-10-CM | POA: Insufficient documentation

## 2014-10-01 DIAGNOSIS — Z87891 Personal history of nicotine dependence: Secondary | ICD-10-CM | POA: Insufficient documentation

## 2014-10-01 DIAGNOSIS — Z7901 Long term (current) use of anticoagulants: Secondary | ICD-10-CM | POA: Insufficient documentation

## 2014-10-01 DIAGNOSIS — C61 Malignant neoplasm of prostate: Secondary | ICD-10-CM | POA: Diagnosis not present

## 2014-10-01 HISTORY — PX: ELECTROPHYSIOLOGIC STUDY: SHX172A

## 2014-10-01 LAB — GLUCOSE, CAPILLARY
GLUCOSE-CAPILLARY: 137 mg/dL — AB (ref 65–99)
Glucose-Capillary: 157 mg/dL — ABNORMAL HIGH (ref 65–99)

## 2014-10-01 SURGERY — A-FLUTTER/A-TACH/SVT ABLATION
Anesthesia: General

## 2014-10-01 MED ORDER — SODIUM CHLORIDE 0.9 % IV SOLN
INTRAVENOUS | Status: DC | PRN
  Administered 2014-10-01: 10:00:00 via INTRAVENOUS

## 2014-10-01 MED ORDER — PROMETHAZINE HCL 25 MG/ML IJ SOLN: 6.2500 mg | INTRAMUSCULAR | Status: DC | PRN

## 2014-10-01 MED ORDER — FENTANYL CITRATE (PF) 100 MCG/2ML IJ SOLN: 25.0000 ug | INTRAMUSCULAR | Status: DC | PRN

## 2014-10-01 MED ORDER — BUPIVACAINE HCL (PF) 0.25 % IJ SOLN
INTRAMUSCULAR | Status: DC | PRN
  Administered 2014-10-01: 20 mL

## 2014-10-01 MED ORDER — HYDROCODONE-ACETAMINOPHEN 5-325 MG PO TABS: 1.0000 | ORAL_TABLET | ORAL | Status: DC | PRN

## 2014-10-01 MED ORDER — SODIUM CHLORIDE 0.9 % IJ SOLN: 3.0000 mL | INTRAMUSCULAR | Status: DC | PRN

## 2014-10-01 MED ORDER — FENTANYL CITRATE (PF) 100 MCG/2ML IJ SOLN
INTRAMUSCULAR | Status: DC | PRN
  Administered 2014-10-01: 50 ug via INTRAVENOUS
  Administered 2014-10-01: 25 ug via INTRAVENOUS

## 2014-10-01 MED ORDER — PROPOFOL 10 MG/ML IV BOLUS
INTRAVENOUS | Status: DC | PRN
  Administered 2014-10-01: 150 mg via INTRAVENOUS

## 2014-10-01 MED ORDER — MEPERIDINE HCL 25 MG/ML IJ SOLN: 6.2500 mg | INTRAMUSCULAR | Status: DC | PRN

## 2014-10-01 MED ORDER — SODIUM CHLORIDE 0.9 % IV SOLN: 250.0000 mL | INTRAVENOUS | Status: DC | PRN

## 2014-10-01 MED ORDER — SODIUM CHLORIDE 0.9 % IJ SOLN: 3.0000 mL | Freq: Two times a day (BID) | INTRAMUSCULAR | Status: DC

## 2014-10-01 MED ORDER — BUPIVACAINE HCL (PF) 0.25 % IJ SOLN
INTRAMUSCULAR | Status: AC
  Filled 2014-10-01: qty 30

## 2014-10-01 MED ORDER — LACTATED RINGERS IV SOLN: INTRAVENOUS | Status: DC

## 2014-10-01 MED ORDER — ONDANSETRON HCL 4 MG/2ML IJ SOLN
INTRAMUSCULAR | Status: DC | PRN
  Administered 2014-10-01: 4 mg via INTRAVENOUS

## 2014-10-01 MED ORDER — DEXAMETHASONE SODIUM PHOSPHATE 4 MG/ML IJ SOLN
INTRAMUSCULAR | Status: DC | PRN
  Administered 2014-10-01: 4 mg via INTRAVENOUS

## 2014-10-01 MED ORDER — MIDAZOLAM HCL 5 MG/5ML IJ SOLN
INTRAMUSCULAR | Status: DC | PRN
  Administered 2014-10-01: 1 mg via INTRAVENOUS

## 2014-10-01 MED ORDER — LIDOCAINE HCL (CARDIAC) 20 MG/ML IV SOLN
INTRAVENOUS | Status: DC | PRN
  Administered 2014-10-01: 20 mg via INTRAVENOUS

## 2014-10-01 MED ORDER — METOPROLOL SUCCINATE ER 25 MG PO TB24: 12.5000 mg | ORAL_TABLET | Freq: Every day | ORAL | Status: DC

## 2014-10-01 SURGICAL SUPPLY — 12 items
BAG SNAP BAND KOVER 36X36 (MISCELLANEOUS) ×3 IMPLANT
BLANKET WARM UNDERBOD FULL ACC (MISCELLANEOUS) ×3 IMPLANT
CATH BLAZERPRIME XP LG CV 10MM (ABLATOR) ×3 IMPLANT
CATH DUODECA/ISMUS 7FR REPROC (CATHETERS) ×3 IMPLANT
CATH JOSEPHSON QUAD-ALLRED 6FR (CATHETERS) ×3 IMPLANT
PACK EP LATEX FREE (CUSTOM PROCEDURE TRAY) ×2
PACK EP LF (CUSTOM PROCEDURE TRAY) ×1 IMPLANT
PAD DEFIB LIFELINK (PAD) ×3 IMPLANT
SHEATH PINNACLE 6F 10CM (SHEATH) ×3 IMPLANT
SHEATH PINNACLE 7F 10CM (SHEATH) ×3 IMPLANT
SHEATH PINNACLE 8F 10CM (SHEATH) ×3 IMPLANT
SHIELD RADPAD SCOOP 12X17 (MISCELLANEOUS) ×3 IMPLANT

## 2014-10-01 NOTE — Progress Notes (Signed)
Site area: rt groin Site Prior to Removal:  Level 0 Pressure Applied For:  20 minutes Manual:   yes Patient Status During Pull:  stable Post Pull Site:  Level  0 Post Pull Instructions Given:  yes Post Pull Pulses Present: yes Dressing Applied:  tegaderm Bedrest begins @  8088 Comments: IV saline locked

## 2014-10-01 NOTE — Progress Notes (Signed)
Doing well s/p ablation VSS No concerns Exam unchanged  OK to discharge to home at 18:00 at end of bedrest Will wean Toprol to 12.5mg  daily x 7 days then stop Resume other medicines  Follow-up with me in 4 weeks Routine wound care  Thompson Grayer MD, [facility] 10/01/2014 4:31 PM

## 2014-10-01 NOTE — Anesthesia Postprocedure Evaluation (Signed)
  Anesthesia Post-op Note  Patient: Bryce Maldonado  Procedure(s) Performed: Procedure(s): A-Flutter/A-Tach/SVT Ablation (N/A)  Patient Location: PACU  Anesthesia Type:General  Level of Consciousness: awake and alert   Airway and Oxygen Therapy: Patient Spontanous Breathing  Post-op Pain: none  Post-op Assessment: Post-op Vital signs reviewed and Patient's Cardiovascular Status Stable              Post-op Vital Signs: Reviewed and stable  Last Vitals:  Filed Vitals:   10/01/14 1215  BP: 103/64  Pulse: 86  Temp:   Resp: 11    Complications: No apparent anesthesia complications

## 2014-10-01 NOTE — Anesthesia Procedure Notes (Signed)
Procedure Name: LMA Insertion Date/Time: 10/01/2014 10:13 AM Performed by: Rush Farmer E Pre-anesthesia Checklist: Patient identified, Emergency Drugs available, Suction available, Patient being monitored and Timeout performed Patient Re-evaluated:Patient Re-evaluated prior to inductionOxygen Delivery Method: Circle system utilized Preoxygenation: Pre-oxygenation with 100% oxygen Intubation Type: IV induction LMA: LMA inserted LMA Size: 4.0 Number of attempts: 1 Placement Confirmation: positive ETCO2 and breath sounds checked- equal and bilateral Tube secured with: Tape Dental Injury: Teeth and Oropharynx as per pre-operative assessment

## 2014-10-01 NOTE — Transfer of Care (Signed)
Immediate Anesthesia Transfer of Care Note  Patient: Bryce Maldonado  Procedure(s) Performed: Procedure(s): A-Flutter/A-Tach/SVT Ablation (N/A)  Patient Location: Cath Lab  Anesthesia Type:General  Level of Consciousness: awake, alert  and oriented  Airway & Oxygen Therapy: Patient Spontanous Breathing and Patient connected to nasal cannula oxygen  Post-op Assessment: Report given to RN, Post -op Vital signs reviewed and stable and Patient moving all extremities  Post vital signs: Reviewed and stable  Last Vitals:  Filed Vitals:   10/01/14 0719  BP: 160/75  Pulse: 70  Temp: 36.6 C  Resp: 18    Complications: No apparent anesthesia complications

## 2014-10-01 NOTE — H&P (Signed)
Electrophysiology Office Note   Date: 09/30/2014   ID: Bryce Maldonado, DOB [birth year], MRN [id number]  PCP: [name], MD Cardiologist: Dr Martinique Primary Electrophysiologist: Thompson Grayer, MD   Chief Complaint  Patient presents with  . Atrial Flutter    History of Present Illness: Bryce Maldonado is a 74 y.o. male who presents today for electrophysiology evaluation. He recently presented to Dr Linna Darner for routine physical 08/13/14 and was found to have a rapid pulse. EKG revealed atrial flutter. He was referred to the AF clinic where he was diagnosed with atrial flutter and initiated on xarelto. He reports compliance with this medicine without interruption. Denies bleeding. He was also placed on metoprolol. He is unaware of atrial flutter.  Today, he denies symptoms of palpitations, chest pain, shortness of breath, orthopnea, PND, lower extremity edema, claudication, dizziness, presyncope, syncope, bleeding, or neurologic sequela. The patient is tolerating medications without difficulties and is otherwise without complaint today.    Past Medical History  Diagnosis Date  . Spinal stenosis 2004    L4 radiculopathy, herniated nucleus pulposus L4-5  . Dyslipidemia   . Coronary artery disease     post CABG x4 -- in 1995 -- due to three-vessel coronary artery disease  . BPH (benign prostatic hyperplasia)     Elevated PSA  . Anemia   . Gout   . Diverticular disease   . Childhood asthma   . Osteoarthritis   . Tinnitus   . Prostate cancer     Dr Karsten Ro  . Typical atrial flutter   . Diabetes    Past Surgical History  Procedure Laterality Date  . Salivary gland surgery  2001    [facility]  . Appendectomy    . Coronary artery bypass graft  10/21/1993    x4 -- using the LIM artery graft to the LAD artery, with saphenous vein grafts to the diagonal branch of the LAD, OM branch  the left circumflex coronary artery, and the posterior descending branch of the RCA -- Est. EF of 65%-- Surgeon: Gaye Pollack, M.D.   . Colonoscopy      negative X3; Dr Carlean Purl  . Prostate biopsy  10/2008  . Total hip arthroplasty  November 2010    right  . Microdiscectomy lumbar  2004    L4-5, Dr. Arnoldo Morale  . Flexible sigmoidoscopy  2005    external hemorrhoids  . Cardiac catheterization  02/10/2010    Est. EF of 60-65% -- Severe three-vessel obstructive atherosclerotic coronary artery disease -- All grafts were patent including left internal mammary artery graft to left anterior descending coronary artery, saphenous vein graft to the diagonal, saphenous vein graft to obtuse marginal vessel, and saphenous vein graft to the posterior descending coronary artery -- Normal left ventricular function   . Total hip arthroplasty  03/2010    L;Dr Alvan Dame     Current Outpatient Prescriptions  Medication Sig Dispense Refill  . allopurinol (ZYLOPRIM) 300 MG tablet Take 300 mg by mouth daily.    [name] ezetimibe (ZETIA) 10 MG tablet Take 10 mg by mouth daily.    . fexofenadine (ALLEGRA) 180 MG tablet Take 180 mg by mouth every morning.     [name] glimepiride (AMARYL) 2 MG tablet Take 1 tablet (2 mg total) by mouth daily before breakfast. 30 tablet 3  . glucose blood (ONETOUCH VERIO) test strip 1 each by Other route 2 (two) times daily. Use to check Blood sugars twice a day Dx E11.9 100 each 3  . metFORMIN (  GLUCOPHAGE) 500 MG tablet TAKE 1 TABLET BY MOUTH TWICE DAILY WITH A MEAL 60 tablet 5  . metoprolol succinate (TOPROL XL) 25 MG 24 hr tablet Take 25 mg by mouth daily.  30 tablet 3  . Multiple Vitamins-Minerals (CENTRUM PO) Take 1 tablet by mouth daily.     . Nutritional Supplements (MELATONIN PO) Take 1 each by mouth at bedtime.     Glory Rosebush DELICA LANCETS 62X MISC Use to help check blood  sugars twice a day Dx E11.9 100 each 3  . rivaroxaban (XARELTO) 20 MG TABS tablet Take 1 tablet (20 mg total) by mouth daily with supper. 30 tablet 3  . rosuvastatin (CRESTOR) 10 MG tablet Take 10 mg by mouth daily.    [name] venlafaxine XR (EFFEXOR-XR) 75 MG 24 hr capsule Take 75 mg by mouth daily.     No current facility-administered medications for this visit.    Allergies: Naproxen sodium   Social History: The patient  reports that he quit smoking about 44 years ago. He has never used smokeless tobacco. He reports that he drinks about 2.4 oz of alcohol per week. He reports that he does not use illicit drugs.   Family History: The patient's family history includes Coronary artery disease in his sister; Diabetes in his father; Heart attack in his paternal aunt; Heart failure (age of onset: 63) in his mother; Hyperlipidemia in his sister; Hypertension in his father; Lung cancer in his father. There is no history of Colon cancer or Stroke.    ROS: Please see the history of present illness. All other systems are reviewed and negative.    PHYSICAL EXAM: VS: BP 100/70 mmHg  Pulse 139  Ht 6' (1.829 m)  Wt 100.426 kg (221 lb 6.4 oz)  BMI 30.02 kg/m2 , BMI Body mass index is 30.02 kg/(m^2). GEN: Well nourished, well developed, in no acute distress  HEENT: normal  Neck: no JVD, carotid bruits, or masses Cardiac: iRRR; no murmurs, rubs, or gallops,no edema  Respiratory: clear to auscultation bilaterally, normal work of breathing GI: soft, nontender, nondistended, + BS MS: no deformity or atrophy  Skin: warm and dry  Neuro: Strength and sensation are intact Psych: euthymic mood, full affect  EKG: EKG is ordered today. The ekg ordered today shows atrial flutter with 2:1 AV conduction   Recent Labs: 08/13/2014: ALT 29; BUN 16; Creat 0.79; Hemoglobin 16.5; Platelets 313; Potassium 5.0; Sodium 139; TSH 1.129    Lipid Panel   Labs (Brief)        Component Value Date/Time   CHOL 146 08/13/2014 1149   TRIG 136 08/13/2014 1149   TRIG 99 12/05/2005 0938   HDL 53 08/13/2014 1149   CHOLHDL 2.8 08/13/2014 1149   CHOLHDL 2.4 CALC 12/05/2005 0938   VLDL 27 08/13/2014 1149   LDLCALC 66 08/13/2014 1149   LDLDIRECT 86.2 06/24/2010 1023       Wt Readings from Last 3 Encounters:  09/30/14 100.426 kg (221 lb 6.4 oz)  08/27/14 101.424 kg (223 lb 9.6 oz)  08/20/14 99.791 kg (220 lb)      Other studies Reviewed: Additional studies/ records that were reviewed today include: AF clinic notes, Dr Morrison Old notes, echo    ASSESSMENT AND PLAN:  1. Typical atrial flutter The patient has sustained typical atrial flutter  chads2vasc score is at least 3. He is appropriately anticoagulated with xarelto. V rates have been poorly controlled, also placing him at risk for CHF over time. Therapeutic strategies for atrial  flutter including medicine and ablation were discussed in detail with the patient today. Risk, benefits, and alternatives to EP study and radiofrequency ablation were also discussed in detail today. These risks include but are not limited to stroke, bleeding, vascular damage, tamponade, perforation, damage to the heart and other structures, AV block requiring pacemaker, worsening renal function, and death. The patient understands these risk and wishes to proceed. We will therefore proceed with catheter ablation at the next available time. Continue anticoagulation in the interim.  2. CAD Stable No change required today  Current medicines are reviewed at length with the patient today.  The patient does not have concerns regarding his medicines. The following changes were made today: none  Signed,  Thompson Grayer, MD  09/30/2014 9:30 AM   [address]  [place]  [id number]  [phone] (office)  [phone] (fax)      Discussed  patient with Dr. Rayann Heman, presented with atrial flutter, anticoagulated with Xarelto.  Plan for ablation today.  Questions answered, risks discussed including bleeding, infection, and tamponade.  Will Camnitz 10/01/2014 9:42 AM

## 2014-10-01 NOTE — Discharge Instructions (Signed)
No driving for 3 days. No lifting over 5 lbs for 1 week. No sexual activity for 1 week. Keep procedure site clean & dry. If you notice increased pain, swelling, bleeding or pus, call/return!  You may shower, but no soaking baths/hot tubs/pools for 1 week.   Cardiac Ablation  Cardiac ablation is a procedure to stop some heart tissue from causing problems. The heart has many electrical connections. Sometimes these connections cause the heart to beat very fast or irregularly. Removing some of the problem areas can improve heart rhythm or make it normal. Ablation is done for people who:   Have Wolff-Parkinson-White syndrome.  Have other fast heart rhythms (tachycardia).  Have taken medicines for an abnormal heart rhythm (arrhythmia) and the medicines had:  No success.  Side effects.  May have a type of heartbeat that could cause death. BEFORE THE PROCEDURE   Follow instructions from your doctor about eating and drinking before the procedure.  Take your medicines as told by your doctor. Take them at regular times with water unless told differently by your doctor.  If you are taking diabetes medicine, ask your doctor how to take it. Ask if there are any special instructions you should follow. Your doctor may change how much insulin you take the day of the procedure. PROCEDURE   A special type of X-ray will be used. The X-ray helps your doctor see images of your heart during the procedure.  A small cut (incision) will be made in your neck or groin.  An IV tube will be started before the procedure begins.  You will be given a numbing medicine (anesthetic) or a medicine to help you relax (sedative).  The skin on your neck or groin will be numbed.  A needle will be put into a large vein in your neck or groin.  A thin, flexible tube (catheter) will be put in to reach your heart.  A dye will be put in the tube. The dye will show up on X-rays. It will help your doctor see the area of the  heart that needs treatment.  When the heart tissue that is causing problems is found, the tip of the tube will send an electrical current to it. This will stop it from causing problems. The tube will be taken ou     Venogram, Care After Refer to this sheet in the next few weeks. These instructions provide you with information on caring for yourself after your procedure. Your health care provider may also give you more specific instructions. Your treatment has been planned according to current medical practices, but problems sometimes occur. Call your health care provider if you have any problems or questions after your procedure. WHAT TO EXPECT AFTER THE PROCEDURE After your procedure, it is typical to have the following sensations: Mild discomfort at the catheter insertion site. HOME CARE INSTRUCTIONS  Take all medicines exactly as directed. Follow any prescribed diet. Follow instructions regarding both rest and physical activity. Drink more fluids for the first several days after the procedure in order to help flush dye from your kidneys. SEEK MEDICAL CARE IF: You develop a rash. You have fever not controlled by medicine. SEEK IMMEDIATE MEDICAL CARE IF: There is pain, drainage, bleeding, redness, swelling, warmth or a red streak at the site of the IV tube. The extremity where your IV tube was placed becomes discolored, numb, or cool. You have difficulty breathing or shortness of breath. You develop chest pain. You have excessive dizziness or fainting.  Document Released: 11/06/2012 Document Revised: 01/21/2013 Document Reviewed: 11/06/2012 [facility] Patient Information 2015 [place], [place]. This information is not intended to replace advice given to you by your health care provider. Make sure you discuss any questions you have with your health care provider.  t.  Pressure will be put on the area where the tube was. This will keep it from bleeding. A bandage will be placed over the  area. AFTER THE PROCEDURE  You will be taken to a recovery area. Your blood pressure, heart rate, and breathing will be watched. The area where the tube was will also be watched for bleeding.  You will need to lie still for 4-6 hours. This keeps the area where the tube was from bleeding. Document Released: 09/18/2012 Document Revised: 06/02/2013 Document Reviewed: 09/18/2012 [facility] Patient Information 2015 [place], [place]. This information is not intended to replace advice given to you by your health care provider. Make sure you discuss any questions you have with your health care provider.    Reduce Toprol to 12.5mg  daily x 7 days then discontinue this medicine.

## 2014-10-01 NOTE — Anesthesia Preprocedure Evaluation (Addendum)
Anesthesia Evaluation  Patient identified by MRN, date of birth, ID band Patient awake    Reviewed: Allergy & Precautions, NPO status , Patient's Chart, lab work & pertinent test results, reviewed documented beta blocker date and time   Airway Mallampati: I  TM Distance: >3 FB Neck ROM: Full    Dental  (+) Teeth Intact   Pulmonary former smoker,  breath sounds clear to auscultation        Cardiovascular hypertension, Pt. on medications and Pt. on home beta blockers + CAD and + CABG + dysrhythmias Atrial Fibrillation Rhythm:Regular     Neuro/Psych negative neurological ROS  negative psych ROS   GI/Hepatic negative GI ROS, Neg liver ROS,   Endo/Other  diabetes, Type 2, Oral Hypoglycemic Agents  Renal/GU negative Renal ROS  negative genitourinary   Musculoskeletal  (+) Arthritis -, Osteoarthritis,    Abdominal   Peds negative pediatric ROS (+)  Hematology   Anesthesia Other Findings   Reproductive/Obstetrics negative OB ROS                           Lab Results  Component Value Date   WBC 7.2 09/30/2014   HGB 14.9 09/30/2014   HCT 43.5 09/30/2014   MCV 92.1 09/30/2014   PLT 293.0 09/30/2014   Lab Results  Component Value Date   CREATININE 0.80 09/30/2014   BUN 21 09/30/2014   NA 137 09/30/2014   K 4.0 09/30/2014   CL 104 09/30/2014   CO2 25 09/30/2014   Lab Results  Component Value Date   INR 0.97 03/04/2010   INR 1.00 12/03/2008   EKG: atrial flutter.  Echo: (08/18/2014) - Left ventricle: The cavity size was normal. Wall thickness was increased in a pattern of moderate LVH. Systolic function was normal. The estimated ejection fraction was in the range of 60% to 65%. - Aortic valve: AV is thickened, calcified with minimally restricted motion Peak and mean gradients through the valve are 15 and 9 mm Hg respectively. Valve area (VTI): 1.75 cm^2. Valve area (Vmax):  1.67 cm^2. Valve area (Vmean): 1.66 cm^2. - Mitral valve: Calcified annulus. Mildly thickened leaflets . There was mild regurgitation. Valve area by continuity equation (using LVOT flow): 1.84 cm^2.   Anesthesia Physical Anesthesia Plan  ASA: III  Anesthesia Plan: General   Post-op Pain Management:    Induction: Intravenous  Airway Management Planned: LMA  Additional Equipment:   Intra-op Plan:   Post-operative Plan: Extubation in OR  Informed Consent: I have reviewed the patients History and Physical, chart, labs and discussed the procedure including the risks, benefits and alternatives for the proposed anesthesia with the patient or authorized representative who has indicated his/her understanding and acceptance.   Dental advisory given  Plan Discussed with: CRNA  Anesthesia Plan Comments:         Anesthesia Quick Evaluation

## 2014-10-02 ENCOUNTER — Telehealth: Payer: Self-pay | Admitting: *Deleted

## 2014-10-02 ENCOUNTER — Encounter (HOSPITAL_COMMUNITY): Payer: Self-pay | Admitting: Internal Medicine

## 2014-10-02 NOTE — Telephone Encounter (Signed)
Pt was on tcm list d/c 10/01/14 had cardiac ablation procedure done. Pt will be f/u with cardiology...Bryce Maldonado

## 2014-10-08 ENCOUNTER — Other Ambulatory Visit: Payer: Self-pay | Admitting: Internal Medicine

## 2014-10-28 ENCOUNTER — Ambulatory Visit: Payer: Medicare PPO | Admitting: Nurse Practitioner

## 2014-11-02 ENCOUNTER — Encounter: Payer: Self-pay | Admitting: Internal Medicine

## 2014-11-02 ENCOUNTER — Ambulatory Visit (INDEPENDENT_AMBULATORY_CARE_PROVIDER_SITE_OTHER): Payer: Medicare PPO | Admitting: Internal Medicine

## 2014-11-02 ENCOUNTER — Ambulatory Visit: Payer: Medicare PPO | Admitting: Cardiology

## 2014-11-02 ENCOUNTER — Encounter: Payer: Self-pay | Admitting: *Deleted

## 2014-11-02 VITALS — BP 130/88 | HR 87 | Ht 72.0 in | Wt 216.8 lb

## 2014-11-02 DIAGNOSIS — I25810 Atherosclerosis of coronary artery bypass graft(s) without angina pectoris: Secondary | ICD-10-CM | POA: Diagnosis not present

## 2014-11-02 DIAGNOSIS — I483 Typical atrial flutter: Secondary | ICD-10-CM | POA: Diagnosis not present

## 2014-11-02 NOTE — Patient Instructions (Addendum)
Medication Instructions:  Your physician recommends that you continue on your current medications as directed. Please refer to the Current Medication list given to you today.   Labwork: Your physician recommends that you return for lab work on 11/27/14---pre procedure labs at 10:30am  Do not have to be fasting   Testing/Procedures: Your physician has recommended that you have an ablation. Catheter ablation is a medical procedure used to treat some cardiac arrhythmias (irregular heartbeats). During catheter ablation, a long, thin, flexible tube is put into a blood vessel in your groin (upper thigh), or neck. This tube is called an ablation catheter. It is then guided to your heart through the blood vessel. Radio frequency waves destroy small areas of heart tissue where abnormal heartbeats may cause an arrhythmia to start. Please see the instruction sheet given to you today.    Follow-Up: Your physician recommends that you schedule a follow-up appointment in: 4 weeks from 12/04/14 with Dr Rayann Heman   Any Other Special Instructions Will Be Listed Below (If Applicable).

## 2014-11-03 ENCOUNTER — Encounter (HOSPITAL_COMMUNITY): Admission: AD | Payer: Self-pay | Source: Ambulatory Visit

## 2014-11-03 ENCOUNTER — Encounter (HOSPITAL_COMMUNITY): Payer: Self-pay | Admitting: Anesthesiology

## 2014-11-03 ENCOUNTER — Ambulatory Visit (HOSPITAL_COMMUNITY): Admission: AD | Admit: 2014-11-03 | Payer: Medicare PPO | Source: Ambulatory Visit | Admitting: Internal Medicine

## 2014-11-03 ENCOUNTER — Telehealth: Payer: Self-pay | Admitting: Internal Medicine

## 2014-11-03 ENCOUNTER — Encounter: Payer: Self-pay | Admitting: Internal Medicine

## 2014-11-03 DIAGNOSIS — I483 Typical atrial flutter: Secondary | ICD-10-CM | POA: Insufficient documentation

## 2014-11-03 SURGERY — A-FLUTTER/A-TACH/SVT ABLATION
Anesthesia: Monitor Anesthesia Care

## 2014-11-03 NOTE — Anesthesia Preprocedure Evaluation (Deleted)
Anesthesia Evaluation  Patient identified by MRN, date of birth, ID band Patient awake    Reviewed: Allergy & Precautions, NPO status , Patient's Chart, lab work & pertinent test results  History of Anesthesia Complications Negative for: history of anesthetic complications  Airway        Dental   Pulmonary asthma , former smoker,           Cardiovascular + CAD  + dysrhythmias Atrial Fibrillation      Neuro/Psych negative neurological ROS  negative psych ROS   GI/Hepatic negative GI ROS, Neg liver ROS,   Endo/Other  diabetes  Renal/GU negative Renal ROS  negative genitourinary   Musculoskeletal  (+) Arthritis ,   Abdominal   Peds negative pediatric ROS (+)  Hematology negative hematology ROS (+)   Anesthesia Other Findings   Reproductive/Obstetrics negative OB ROS                             Anesthesia Physical Anesthesia Plan  ASA: III  Anesthesia Plan: General   Post-op Pain Management:    Induction: Intravenous  Airway Management Planned: LMA  Additional Equipment:   Intra-op Plan:   Post-operative Plan: Extubation in OR  Informed Consent:   Plan Discussed with:   Anesthesia Plan Comments:         Anesthesia Quick Evaluation

## 2014-11-03 NOTE — Telephone Encounter (Signed)
Spoke with wife and Santiago Glad in the lab.  Chanetta Marshall, NP is who called this morning  He was so asleep that he agreed to come today and he can not.  Santiago Glad is aware.  The wife was very frustrated in the fact they could not reach anyone to tell them he was not coming.  I explained it was ok and would keep as scheduled yesterday for 12/04/14

## 2014-11-03 NOTE — Progress Notes (Signed)
Electrophysiology Office Note   Date:  11/03/2014   ID:  Bryce Maldonado, DOB 11-07-1940, MRN [id number]  PCP:  [name], MD  Cardiologist:  Dr [place]ique Primary Electrophysiologist: Thompson Grayer, MD    Chief Complaint  Patient presents with  . Typical atrial flutter     History of Present Illness: Bryce Maldonado is a 74 y.o. male who presents today for electrophysiology evaluation.   He recently presented to Dr Linna Darner for routine physical 08/13/14 and was found to have a rapid pulse.  EKG revealed atrial flutter.  He was placed on anticoagulation and underwent atrial flutter ablation 10/01/14.  He did well with the procedure and reports feeling "Great" in sinus rhythm.  Unfortunately, he has returned to atrial flutter. Today, he denies symptoms of palpitations, chest pain, shortness of breath, orthopnea, PND, lower extremity edema, claudication, dizziness, presyncope, syncope, bleeding, or neurologic sequela. The patient is tolerating medications without difficulties and is otherwise without complaint today.    Past Medical History  Diagnosis Date  . Spinal stenosis 2004    L4 radiculopathy, herniated nucleus pulposus L4-5  . Dyslipidemia   . Coronary artery disease     post CABG x4 -- in 1995 -- due to three-vessel coronary artery disease  . BPH (benign prostatic hyperplasia)     Elevated PSA  . Anemia   . Gout   . Diverticular disease   . Childhood asthma   . Osteoarthritis   . Tinnitus   . Prostate cancer ([place])     Dr Karsten Ro  . Typical atrial flutter ([place])   . Diabetes [facility])    Past Surgical History  Procedure Laterality Date  . Salivary gland surgery  2001    [place]  . Appendectomy    . Coronary artery bypass graft  10/21/1993    x4 -- using the LIM artery graft to the LAD artery, with saphenous vein grafts to the diagonal branch of the LAD, OM branch the left circumflex coronary artery, and the posterior descending branch of the RCA -- Est. EF of 65%--  Surgeon: Gaye Pollack, M.D.              . Colonoscopy      negative X3; Dr Carlean Purl  . Prostate biopsy  10/2008  . Total hip arthroplasty  November 2010    right  . Microdiscectomy lumbar  2004    L4-5, Dr. Arnoldo Morale  . Flexible sigmoidoscopy  2005     external hemorrhoids  . Cardiac catheterization  02/10/2010    Est. EF of 60-65% -- Severe three-vessel obstructive atherosclerotic coronary artery disease -- All grafts were patent including left internal mammary artery graft to left anterior descending coronary artery, saphenous vein graft to the diagonal, saphenous vein graft to obtuse marginal vessel, and saphenous vein graft to the posterior descending coronary artery -- Normal left ventricular function   . Total hip arthroplasty  03/2010    L;Dr Alvan Dame  . Electrophysiologic study N/A 10/01/2014    Procedure: A-Flutter/A-Tach/SVT Ablation;  Surgeon: Thompson Grayer, MD;  Location: [place] [facility];  Service: Cardiovascular;  Laterality: N/A;     Current Outpatient Prescriptions  Medication Sig Dispense Refill  . allopurinol (ZYLOPRIM) 300 MG tablet Take 300 mg by mouth daily.    [name] ezetimibe (ZETIA) 10 MG tablet Take 10 mg by mouth daily.    . fexofenadine (ALLEGRA) 180 MG tablet Take 180 mg by mouth every morning.     [name] glimepiride (AMARYL) 2  MG tablet Take 1 tablet (2 mg total) by mouth daily before breakfast. 30 tablet 3  . glucose blood (ONETOUCH VERIO) test strip 1 each by Other route 2 (two) times daily. Use to check  Blood sugars twice a day Dx E11.9 100 each 3  . metFORMIN (GLUCOPHAGE) 500 MG tablet TAKE 1 TABLET BY MOUTH TWICE DAILY WITH A MEAL 60 tablet 5  . Multiple Vitamins-Minerals (CENTRUM PO) Take 1 tablet by mouth daily.     . Nutritional Supplements (MELATONIN PO) Take 1 each by mouth at bedtime.      Glory Rosebush DELICA LANCETS 73Z MISC Use to help check blood sugars twice a day Dx E11.9 100 each 3  . rivaroxaban (XARELTO) 20 MG TABS tablet Take 1 tablet (20 mg total) by  mouth daily with supper. 30 tablet 3  . rosuvastatin (CRESTOR) 10 MG tablet Take 10 mg by mouth daily.    [name] venlafaxine XR (EFFEXOR-XR) 75 MG 24 hr capsule Take 75 mg by mouth daily.     No current facility-administered medications for this visit.    Allergies:   Naproxen sodium   Social History:  The patient  reports that he quit smoking about 44 years ago. He has never used smokeless tobacco. He reports that he drinks about 2.4 oz of alcohol per week. He reports that he does not use illicit drugs.   Family History:  The patient's  family history includes Coronary artery disease in his sister; Diabetes in his father; Heart attack in his paternal aunt; Heart failure (age of onset: 66) in his mother; Hyperlipidemia in his sister; Hypertension in his father; Lung cancer in his father. There is no history of Colon cancer or Stroke.    ROS:  Please see the history of present illness.   All other systems are reviewed and negative.    PHYSICAL EXAM: VS:  BP 130/88 mmHg  Pulse 87  Ht 6' (1.829 m)  Wt 216 lb 12.8 oz (98.34 kg)  BMI 29.40 kg/m2 , BMI Body mass index is 29.4 kg/(m^2). GEN: Well nourished, well developed, in no acute distress HEENT: normal Neck: no JVD, carotid bruits, or masses Cardiac: iRRR; no murmurs, rubs, or gallops,no edema  Respiratory:  clear to auscultation bilaterally, normal work of breathing GI: soft, nontender, nondistended, + BS MS: no deformity or atrophy Skin: warm and dry  Neuro:  Strength and sensation are intact Psych: euthymic mood, full affect  EKG:  EKG is ordered today. The ekg ordered today shows atrial flutter with variable AV conduction   Recent Labs: 08/13/2014: ALT 29; TSH 1.129 09/30/2014: BUN 21; Creatinine, Ser 0.80; Hemoglobin 14.9; Platelets 293.0; Potassium 4.0; Sodium 137    Lipid Panel     Component Value Date/Time   CHOL 146 08/13/2014 1149   TRIG 136 08/13/2014 1149   TRIG 99 12/05/2005 0938   HDL 53 08/13/2014 1149    CHOLHDL 2.8 08/13/2014 1149   CHOLHDL 2.4 CALC 12/05/2005 0938   VLDL 27 08/13/2014 1149   LDLCALC 66 08/13/2014 1149   LDLDIRECT 86.2 06/24/2010 1023     Wt Readings from Last 3 Encounters:  11/02/14 216 lb 12.8 oz (98.34 kg)  10/01/14 220 lb (99.791 kg)  09/30/14 221 lb 6.4 oz (100.426 kg)     ASSESSMENT AND PLAN:  1.  Typical atrial flutter The patient has sustained typical atrial flutter recurrent s/p ablation chads2vasc score is at least 3.  He is appropriately anticoagulated with xarelto. Given difficulty controlling V  rates, I feel that we should again proceed with ablation with hopes of being able to discontinue anticoagulation long term. Therapeutic strategies for atrial flutter including medicine and ablation were discussed in detail with the patient today. Risk, benefits, and alternatives to repeat EP study and radiofrequency ablation were also discussed in detail today. These risks include but are not limited to stroke, bleeding, vascular damage, tamponade, perforation, damage to the heart and other structures, AV block requiring pacemaker, worsening renal function, and death. The patient understands these risk and wishes to proceed.  We will therefore proceed with catheter ablation at the next available time.  Continue anticoagulation in the interim.  2. CAD Stable No change required today  Current medicines are reviewed at length with the patient today.   The patient does not have concerns regarding his medicines.  The following changes were made today:  none  Signed, Thompson Grayer, MD  11/03/2014 10:43 PM     [address] [address] [phone] (office) [phone] (fax)

## 2014-11-03 NOTE — Telephone Encounter (Signed)
New problem   Pt stated he can't have his ablation today because he is going out of town

## 2014-11-04 ENCOUNTER — Other Ambulatory Visit (INDEPENDENT_AMBULATORY_CARE_PROVIDER_SITE_OTHER): Payer: Medicare PPO

## 2014-11-04 DIAGNOSIS — I483 Typical atrial flutter: Secondary | ICD-10-CM | POA: Diagnosis not present

## 2014-11-04 LAB — CBC WITH DIFFERENTIAL/PLATELET
BASOS ABS: 0 10*3/uL (ref 0.0–0.1)
Basophils Relative: 0.4 % (ref 0.0–3.0)
Eosinophils Absolute: 0.2 10*3/uL (ref 0.0–0.7)
Eosinophils Relative: 2.7 % (ref 0.0–5.0)
HCT: 45.5 % (ref 39.0–52.0)
Hemoglobin: 15.5 g/dL (ref 13.0–17.0)
LYMPHS ABS: 1.6 10*3/uL (ref 0.7–4.0)
Lymphocytes Relative: 18.5 % (ref 12.0–46.0)
MCHC: 34 g/dL (ref 30.0–36.0)
MCV: 91.9 fl (ref 78.0–100.0)
Monocytes Absolute: 0.8 10*3/uL (ref 0.1–1.0)
Monocytes Relative: 8.5 % (ref 3.0–12.0)
NEUTROS ABS: 6.2 10*3/uL (ref 1.4–7.7)
NEUTROS PCT: 69.9 % (ref 43.0–77.0)
PLATELETS: 290 10*3/uL (ref 150.0–400.0)
RBC: 4.96 Mil/uL (ref 4.22–5.81)
RDW: 13.5 % (ref 11.5–15.5)
WBC: 8.9 10*3/uL (ref 4.0–10.5)

## 2014-11-04 LAB — BASIC METABOLIC PANEL
BUN: 15 mg/dL (ref 6–23)
CALCIUM: 10 mg/dL (ref 8.4–10.5)
CO2: 30 meq/L (ref 19–32)
CREATININE: 1.04 mg/dL (ref 0.40–1.50)
Chloride: 102 mEq/L (ref 96–112)
GFR: 74.39 mL/min (ref >60.00)
GLUCOSE: 146 mg/dL — AB (ref 70–99)
Potassium: 3.9 mEq/L (ref 3.5–5.1)
SODIUM: 140 meq/L (ref 135–145)

## 2014-11-11 ENCOUNTER — Telehealth: Payer: Self-pay | Admitting: Internal Medicine

## 2014-11-11 NOTE — Telephone Encounter (Signed)
Pt request lab result for the A1C, he can not get into hi mychart. Please call pt back

## 2014-11-12 NOTE — Telephone Encounter (Signed)
LVM returning pts phone call.

## 2014-11-13 ENCOUNTER — Telehealth: Payer: Self-pay | Admitting: Emergency Medicine

## 2014-11-13 DIAGNOSIS — IMO0002 Reserved for concepts with insufficient information to code with codable children: Secondary | ICD-10-CM

## 2014-11-13 DIAGNOSIS — E1365 Other specified diabetes mellitus with hyperglycemia: Principal | ICD-10-CM

## 2014-11-13 DIAGNOSIS — E1351 Other specified diabetes mellitus with diabetic peripheral angiopathy without gangrene: Secondary | ICD-10-CM

## 2014-11-13 NOTE — Telephone Encounter (Signed)
Spoke with pt. He came in on 11/04/14 to have labs drawn. Hgb A1c was not drawn. He will come back to have this done.

## 2014-11-23 ENCOUNTER — Telehealth: Payer: Self-pay | Admitting: Internal Medicine

## 2014-11-23 NOTE — Telephone Encounter (Signed)
Spoke with patient and let him know the labs could not be more than 37 weeks old.  He will keep 11/27/14 apt time

## 2014-11-23 NOTE — Telephone Encounter (Signed)
New Message  Pt has ablation on 11/4 and wanted to know if labs drawn on 10/5 @ [facility] is okay and he can cancel labwork on 10/28. Please call back and discuss.

## 2014-11-27 ENCOUNTER — Other Ambulatory Visit (INDEPENDENT_AMBULATORY_CARE_PROVIDER_SITE_OTHER): Payer: Medicare PPO

## 2014-11-27 DIAGNOSIS — E782 Mixed hyperlipidemia: Secondary | ICD-10-CM | POA: Diagnosis not present

## 2014-11-27 LAB — BASIC METABOLIC PANEL
BUN: 20 mg/dL (ref 7–25)
CHLORIDE: 101 mmol/L (ref 98–110)
CO2: 28 mmol/L (ref 20–31)
CREATININE: 0.83 mg/dL (ref 0.70–1.18)
Calcium: 9.6 mg/dL (ref 8.6–10.3)
Glucose, Bld: 141 mg/dL — ABNORMAL HIGH (ref 65–99)
POTASSIUM: 4.1 mmol/L (ref 3.5–5.3)
Sodium: 138 mmol/L (ref 135–146)

## 2014-11-27 LAB — CBC
HCT: 45.1 % (ref 39.0–52.0)
Hemoglobin: 15.3 g/dL (ref 13.0–17.0)
MCH: 31.3 pg (ref 26.0–34.0)
MCHC: 33.9 g/dL (ref 30.0–36.0)
MCV: 92.2 fL (ref 78.0–100.0)
MPV: 9.4 fL (ref 8.6–12.4)
PLATELETS: 287 10*3/uL (ref 150–400)
RBC: 4.89 MIL/uL (ref 4.22–5.81)
RDW: 13.3 % (ref 11.5–15.5)
WBC: 6.9 10*3/uL (ref 4.0–10.5)

## 2014-12-04 ENCOUNTER — Encounter (HOSPITAL_COMMUNITY): Payer: Self-pay | Admitting: General Practice

## 2014-12-04 ENCOUNTER — Ambulatory Visit (HOSPITAL_COMMUNITY)
Admission: RE | Admit: 2014-12-04 | Discharge: 2014-12-04 | Disposition: A | Discharge status: Home / Self Care | Payer: Medicare PPO | Source: Ambulatory Visit | Attending: Internal Medicine | Admitting: Internal Medicine

## 2014-12-04 ENCOUNTER — Ambulatory Visit (HOSPITAL_COMMUNITY): Payer: Medicare PPO | Admitting: Certified Registered Nurse Anesthetist

## 2014-12-04 ENCOUNTER — Encounter (HOSPITAL_COMMUNITY)
Admission: RE | Disposition: A | Discharge status: Home / Self Care | Payer: Self-pay | Source: Ambulatory Visit | Attending: Internal Medicine

## 2014-12-04 ENCOUNTER — Other Ambulatory Visit: Payer: Self-pay | Admitting: Cardiology

## 2014-12-04 DIAGNOSIS — Z8546 Personal history of malignant neoplasm of prostate: Secondary | ICD-10-CM | POA: Diagnosis not present

## 2014-12-04 DIAGNOSIS — I483 Typical atrial flutter: Secondary | ICD-10-CM

## 2014-12-04 DIAGNOSIS — Z87891 Personal history of nicotine dependence: Secondary | ICD-10-CM | POA: Insufficient documentation

## 2014-12-04 DIAGNOSIS — Z951 Presence of aortocoronary bypass graft: Secondary | ICD-10-CM | POA: Insufficient documentation

## 2014-12-04 DIAGNOSIS — Z79899 Other long term (current) drug therapy: Secondary | ICD-10-CM | POA: Insufficient documentation

## 2014-12-04 DIAGNOSIS — I2510 Atherosclerotic heart disease of native coronary artery without angina pectoris: Secondary | ICD-10-CM | POA: Diagnosis not present

## 2014-12-04 DIAGNOSIS — Z7984 Long term (current) use of oral hypoglycemic drugs: Secondary | ICD-10-CM | POA: Insufficient documentation

## 2014-12-04 DIAGNOSIS — I4892 Unspecified atrial flutter: Secondary | ICD-10-CM | POA: Diagnosis not present

## 2014-12-04 DIAGNOSIS — Z7901 Long term (current) use of anticoagulants: Secondary | ICD-10-CM | POA: Insufficient documentation

## 2014-12-04 DIAGNOSIS — E119 Type 2 diabetes mellitus without complications: Secondary | ICD-10-CM | POA: Insufficient documentation

## 2014-12-04 DIAGNOSIS — I471 Supraventricular tachycardia: Secondary | ICD-10-CM | POA: Diagnosis present

## 2014-12-04 DIAGNOSIS — I4891 Unspecified atrial fibrillation: Secondary | ICD-10-CM | POA: Diagnosis present

## 2014-12-04 HISTORY — PX: ATRIAL FLUTTER ABLATION: SHX5733

## 2014-12-04 HISTORY — DX: Type 2 diabetes mellitus without complications: E11.9

## 2014-12-04 HISTORY — PX: ELECTROPHYSIOLOGIC STUDY: SHX172A

## 2014-12-04 LAB — GLUCOSE, CAPILLARY
GLUCOSE-CAPILLARY: 139 mg/dL — AB (ref 65–99)
GLUCOSE-CAPILLARY: 122 mg/dL — AB (ref 65–99)
Glucose-Capillary: 106 mg/dL — ABNORMAL HIGH (ref 65–99)
Glucose-Capillary: 128 mg/dL — ABNORMAL HIGH (ref 65–99)

## 2014-12-04 SURGERY — A-FLUTTER/A-TACH/SVT ABLATION
Anesthesia: General

## 2014-12-04 MED ORDER — SODIUM CHLORIDE 0.9 % IV SOLN
INTRAVENOUS | Status: DC
  Administered 2014-12-04 (×2): via INTRAVENOUS

## 2014-12-04 MED ORDER — BUPIVACAINE HCL (PF) 0.25 % IJ SOLN
INTRAMUSCULAR | Status: DC | PRN
  Administered 2014-12-04: 5 mL

## 2014-12-04 MED ORDER — RIVAROXABAN 20 MG PO TABS
20.0000 mg | ORAL_TABLET | Freq: Every day | ORAL | Status: DC
  Administered 2014-12-04: 20 mg via ORAL
  Filled 2014-12-04: qty 1

## 2014-12-04 MED ORDER — ALLOPURINOL 300 MG PO TABS: 300.0000 mg | ORAL_TABLET | Freq: Every day | ORAL | Status: DC

## 2014-12-04 MED ORDER — LIDOCAINE HCL (CARDIAC) 20 MG/ML IV SOLN
INTRAVENOUS | Status: DC | PRN
  Administered 2014-12-04: 60 mg via INTRAVENOUS

## 2014-12-04 MED ORDER — SODIUM CHLORIDE 0.9 % IJ SOLN: 3.0000 mL | INTRAMUSCULAR | Status: DC | PRN

## 2014-12-04 MED ORDER — MELATONIN 5 MG PO TABS: 5.0000 mg | ORAL_TABLET | Freq: Every day | ORAL | Status: DC

## 2014-12-04 MED ORDER — VENLAFAXINE HCL ER 75 MG PO CP24: 75.0000 mg | ORAL_CAPSULE | Freq: Every day | ORAL | Status: DC

## 2014-12-04 MED ORDER — FENTANYL CITRATE (PF) 100 MCG/2ML IJ SOLN: 25.0000 ug | INTRAMUSCULAR | Status: DC | PRN

## 2014-12-04 MED ORDER — METFORMIN HCL 500 MG PO TABS
500.0000 mg | ORAL_TABLET | Freq: Two times a day (BID) | ORAL | Status: DC
  Administered 2014-12-04: 18:00:00 500 mg via ORAL
  Filled 2014-12-04: qty 1

## 2014-12-04 MED ORDER — EZETIMIBE 10 MG PO TABS: 10.0000 mg | ORAL_TABLET | Freq: Every day | ORAL | Status: DC

## 2014-12-04 MED ORDER — PHENYLEPHRINE HCL 10 MG/ML IJ SOLN
INTRAMUSCULAR | Status: DC | PRN
  Administered 2014-12-04 (×3): 40 ug via INTRAVENOUS

## 2014-12-04 MED ORDER — RIVAROXABAN 20 MG PO TABS: 20.0000 mg | ORAL_TABLET | Freq: Every day | ORAL | Status: DC

## 2014-12-04 MED ORDER — HYDROCODONE-ACETAMINOPHEN 5-325 MG PO TABS: 1.0000 | ORAL_TABLET | ORAL | Status: DC | PRN

## 2014-12-04 MED ORDER — BUPIVACAINE HCL (PF) 0.25 % IJ SOLN
INTRAMUSCULAR | Status: AC
  Filled 2014-12-04: qty 30

## 2014-12-04 MED ORDER — SODIUM CHLORIDE 0.9 % IJ SOLN: 3.0000 mL | Freq: Two times a day (BID) | INTRAMUSCULAR | Status: DC

## 2014-12-04 MED ORDER — ONDANSETRON HCL 4 MG/2ML IJ SOLN: 4.0000 mg | Freq: Four times a day (QID) | INTRAMUSCULAR | Status: DC | PRN

## 2014-12-04 MED ORDER — GLIMEPIRIDE 2 MG PO TABS
2.0000 mg | ORAL_TABLET | Freq: Every day | ORAL | Status: DC
  Filled 2014-12-04: qty 1

## 2014-12-04 MED ORDER — SODIUM CHLORIDE 0.9 % IV SOLN: 250.0000 mL | INTRAVENOUS | Status: DC | PRN

## 2014-12-04 MED ORDER — ACETAMINOPHEN 325 MG PO TABS: 650.0000 mg | ORAL_TABLET | ORAL | Status: DC | PRN

## 2014-12-04 MED ORDER — PROMETHAZINE HCL 25 MG/ML IJ SOLN: 6.2500 mg | INTRAMUSCULAR | Status: DC | PRN

## 2014-12-04 MED ORDER — PROPOFOL 10 MG/ML IV BOLUS
INTRAVENOUS | Status: DC | PRN
  Administered 2014-12-04: 150 mg via INTRAVENOUS

## 2014-12-04 MED ORDER — ONDANSETRON HCL 4 MG/2ML IJ SOLN
INTRAMUSCULAR | Status: DC | PRN
  Administered 2014-12-04: 4 mg via INTRAVENOUS

## 2014-12-04 MED ORDER — FENTANYL CITRATE (PF) 250 MCG/5ML IJ SOLN
INTRAMUSCULAR | Status: DC | PRN
  Administered 2014-12-04: 50 ug via INTRAVENOUS
  Administered 2014-12-04 (×2): 25 ug via INTRAVENOUS

## 2014-12-04 MED ORDER — ROSUVASTATIN CALCIUM 10 MG PO TABS: 10.0000 mg | ORAL_TABLET | Freq: Every day | ORAL | Status: DC

## 2014-12-04 SURGICAL SUPPLY — 12 items
BAG SNAP BAND KOVER 36X36 (MISCELLANEOUS) ×4 IMPLANT
BLANKET WARM UNDERBOD FULL ACC (MISCELLANEOUS) ×2 IMPLANT
CATH BLAZERPRIME XP LG CV 10MM (ABLATOR) ×2 IMPLANT
CATH DUODECA HALO/ISMUS 7FR (CATHETERS) ×2 IMPLANT
CATH JOSEPHSON QUAD-ALLRED 6FR (CATHETERS) ×2 IMPLANT
CATH WEB BIDIR CS D-F NONAUTO (CATHETERS) ×2 IMPLANT
PACK EP LATEX FREE (CUSTOM PROCEDURE TRAY) ×1
PACK EP LF (CUSTOM PROCEDURE TRAY) ×1 IMPLANT
PAD DEFIB LIFELINK (PAD) ×4 IMPLANT
SHEATH PINNACLE 7F 10CM (SHEATH) ×4 IMPLANT
SHEATH PINNACLE 8F 10CM (SHEATH) ×2 IMPLANT
SHIELD RADPAD SCOOP 12X17 (MISCELLANEOUS) ×2 IMPLANT

## 2014-12-04 NOTE — Progress Notes (Signed)
Doing well s/p ablation VSS Exam is normal  Will DC to home tonight after 5 hours bedrest Resume home medicines  Follow-up with me as scheduled in 4 weeks  Thompson Grayer MD, [facility] 12/04/2014 5:53 PM

## 2014-12-04 NOTE — Progress Notes (Signed)

## 2014-12-04 NOTE — Progress Notes (Signed)
Patient post bedrest of ablation. Ambulated hall denies any pain and right groin level 0 no complications. Will proceed with discharge per orders. Family at bedside to assist with patient at home and overnight. Discharge papers with instructions given. Patient will be discharged out by wheelchair. Patient stable at this time.

## 2014-12-04 NOTE — Anesthesia Postprocedure Evaluation (Signed)
  Anesthesia Post-op Note  Patient: Bryce Maldonado  Procedure(s) Performed: Procedure(s): A-Flutter (N/A)  Patient Location: PACU  Anesthesia Type: General   Level of Consciousness: awake, alert  and oriented  Airway and Oxygen Therapy: Patient Spontanous Breathing  Post-op Pain: none  Post-op Assessment: Post-op Vital signs reviewed  Post-op Vital Signs: Reviewed  Last Vitals:  Filed Vitals:   12/04/14 1700  BP: 137/80  Pulse: 85  Temp: 36.6 C  Resp: 13    Complications: No apparent anesthesia complications

## 2014-12-04 NOTE — Progress Notes (Signed)
Site area: RFV X3 Site Prior to Removal:  Level 0 Pressure Applied For:20MIN Manual:YES    Patient Status During Pull:stable   Post Pull Site:  Level 0 Post Pull Instructions Given:  yes Post Pull Pulses Present:palpable  Dressing Applied:clear   Bedrest begins @ 1630 Comments:

## 2014-12-04 NOTE — H&P (View-Only) (Signed)
Electrophysiology Office Note   Date:  11/03/2014   ID:  Bryce Maldonado, DOB 04/09/1940, MRN [id number]  PCP:  [name], MD  Cardiologist:  Dr [place] Primary Electrophysiologist: Thompson Grayer, MD    Chief Complaint  Patient presents with  . Typical atrial flutter     History of Present Illness: Bryce Maldonado is a 74 y.o. male who presents today for electrophysiology evaluation.   He recently presented to Dr Linna Darner for routine physical 08/13/14 and was found to have a rapid pulse.  EKG revealed atrial flutter.  He was placed on anticoagulation and underwent atrial flutter ablation 10/01/14.  He did well with the procedure and reports feeling "Great" in sinus rhythm.  Unfortunately, he has returned to atrial flutter. Today, he denies symptoms of palpitations, chest pain, shortness of breath, orthopnea, PND, lower extremity edema, claudication, dizziness, presyncope, syncope, bleeding, or neurologic sequela. The patient is tolerating medications without difficulties and is otherwise without complaint today.    Past Medical History  Diagnosis Date  . Spinal stenosis 2004    L4 radiculopathy, herniated nucleus pulposus L4-5  . Dyslipidemia   . Coronary artery disease     post CABG x4 -- in 1995 -- due to three-vessel coronary artery disease  . BPH (benign prostatic hyperplasia)     Elevated PSA  . Anemia   . Gout   . Diverticular disease   . Childhood asthma   . Osteoarthritis   . Tinnitus   . Prostate cancer ([place])     Dr Karsten Ro  . Typical atrial flutter ([place])   . Diabetes [facility])    Past Surgical History  Procedure Laterality Date  . Salivary gland surgery  2001    [place]  . Appendectomy    . Coronary artery bypass graft  10/21/1993    x4 -- using the LIM artery graft to the LAD artery, with saphenous vein grafts to the diagonal branch of the LAD, OM branch the left circumflex coronary artery, and the posterior descending branch of the RCA -- Est. EF of 65%--  Surgeon: Gaye Pollack, M.D.              . Colonoscopy      negative X3; Dr Carlean Purl  . Prostate biopsy  10/2008  . Total hip arthroplasty  November 2010    right  . Microdiscectomy lumbar  2004    L4-5, Dr. Arnoldo Morale  . Flexible sigmoidoscopy  2005     external hemorrhoids  . Cardiac catheterization  02/10/2010    Est. EF of 60-65% -- Severe three-vessel obstructive atherosclerotic coronary artery disease -- All grafts were patent including left internal mammary artery graft to left anterior descending coronary artery, saphenous vein graft to the diagonal, saphenous vein graft to obtuse marginal vessel, and saphenous vein graft to the posterior descending coronary artery -- Normal left ventricular function   . Total hip arthroplasty  03/2010    L;Dr Alvan Dame  . Electrophysiologic study N/A 10/01/2014    Procedure: A-Flutter/A-Tach/SVT Ablation;  Surgeon: Thompson Grayer, MD;  Location: [place] [facility];  Service: Cardiovascular;  Laterality: N/A;     Current Outpatient Prescriptions  Medication Sig Dispense Refill  . allopurinol (ZYLOPRIM) 300 MG tablet Take 300 mg by mouth daily.    [name] ezetimibe (ZETIA) 10 MG tablet Take 10 mg by mouth daily.    . fexofenadine (ALLEGRA) 180 MG tablet Take 180 mg by mouth every morning.     [name] glimepiride (AMARYL) 2  MG tablet Take 1 tablet (2 mg total) by mouth daily before breakfast. 30 tablet 3  . glucose blood (ONETOUCH VERIO) test strip 1 each by Other route 2 (two) times daily. Use to check  Blood sugars twice a day Dx E11.9 100 each 3  . metFORMIN (GLUCOPHAGE) 500 MG tablet TAKE 1 TABLET BY MOUTH TWICE DAILY WITH A MEAL 60 tablet 5  . Multiple Vitamins-Minerals (CENTRUM PO) Take 1 tablet by mouth daily.     . Nutritional Supplements (MELATONIN PO) Take 1 each by mouth at bedtime.      Glory Rosebush DELICA LANCETS 50K MISC Use to help check blood sugars twice a day Dx E11.9 100 each 3  . rivaroxaban (XARELTO) 20 MG TABS tablet Take 1 tablet (20 mg total) by  mouth daily with supper. 30 tablet 3  . rosuvastatin (CRESTOR) 10 MG tablet Take 10 mg by mouth daily.    [name] venlafaxine XR (EFFEXOR-XR) 75 MG 24 hr capsule Take 75 mg by mouth daily.     No current facility-administered medications for this visit.    Allergies:   Naproxen sodium   Social History:  The patient  reports that he quit smoking about 44 years ago. He has never used smokeless tobacco. He reports that he drinks about 2.4 oz of alcohol per week. He reports that he does not use illicit drugs.   Family History:  The patient's  family history includes Coronary artery disease in his sister; Diabetes in his father; Heart attack in his paternal aunt; Heart failure (age of onset: 85) in his mother; Hyperlipidemia in his sister; Hypertension in his father; Lung cancer in his father. There is no history of Colon cancer or Stroke.    ROS:  Please see the history of present illness.   All other systems are reviewed and negative.    PHYSICAL EXAM: VS:  BP 130/88 mmHg  Pulse 87  Ht 6' (1.829 m)  Wt 216 lb 12.8 oz (98.34 kg)  BMI 29.40 kg/m2 , BMI Body mass index is 29.4 kg/(m^2). GEN: Well nourished, well developed, in no acute distress HEENT: normal Neck: no JVD, carotid bruits, or masses Cardiac: iRRR; no murmurs, rubs, or gallops,no edema  Respiratory:  clear to auscultation bilaterally, normal work of breathing GI: soft, nontender, nondistended, + BS MS: no deformity or atrophy Skin: warm and dry  Neuro:  Strength and sensation are intact Psych: euthymic mood, full affect  EKG:  EKG is ordered today. The ekg ordered today shows atrial flutter with variable AV conduction   Recent Labs: 08/13/2014: ALT 29; TSH 1.129 09/30/2014: BUN 21; Creatinine, Ser 0.80; Hemoglobin 14.9; Platelets 293.0; Potassium 4.0; Sodium 137    Lipid Panel     Component Value Date/Time   CHOL 146 08/13/2014 1149   TRIG 136 08/13/2014 1149   TRIG 99 12/05/2005 0938   HDL 53 08/13/2014 1149    CHOLHDL 2.8 08/13/2014 1149   CHOLHDL 2.4 CALC 12/05/2005 0938   VLDL 27 08/13/2014 1149   LDLCALC 66 08/13/2014 1149   LDLDIRECT 86.2 06/24/2010 1023     Wt Readings from Last 3 Encounters:  11/02/14 216 lb 12.8 oz (98.34 kg)  10/01/14 220 lb (99.791 kg)  09/30/14 221 lb 6.4 oz (100.426 kg)     ASSESSMENT AND PLAN:  1.  Typical atrial flutter The patient has sustained typical atrial flutter recurrent s/p ablation chads2vasc score is at least 3.  He is appropriately anticoagulated with xarelto. Given difficulty controlling V  rates, I feel that we should again proceed with ablation with hopes of being able to discontinue anticoagulation long term. Therapeutic strategies for atrial flutter including medicine and ablation were discussed in detail with the patient today. Risk, benefits, and alternatives to repeat EP study and radiofrequency ablation were also discussed in detail today. These risks include but are not limited to stroke, bleeding, vascular damage, tamponade, perforation, damage to the heart and other structures, AV block requiring pacemaker, worsening renal function, and death. The patient understands these risk and wishes to proceed.  We will therefore proceed with catheter ablation at the next available time.  Continue anticoagulation in the interim.  2. CAD Stable No change required today  Current medicines are reviewed at length with the patient today.   The patient does not have concerns regarding his medicines.  The following changes were made today:  none  Signed, Thompson Grayer, MD  11/03/2014 10:43 PM     [place]  [id number] [phone] (office) [phone] (fax)

## 2014-12-04 NOTE — Anesthesia Preprocedure Evaluation (Addendum)
Anesthesia Evaluation  Patient identified by MRN, date of birth, ID band Patient awake    Reviewed: Allergy & Precautions, NPO status , Patient's Chart, lab work & pertinent test results  History of Anesthesia Complications Negative for: history of anesthetic complications  Airway Mallampati: II  TM Distance: >3 FB Neck ROM: Full    Dental  (+) Teeth Intact, Dental Advisory Given   Pulmonary asthma , former smoker,    breath sounds clear to auscultation       Cardiovascular + CAD and + CABG  + dysrhythmias Atrial Fibrillation  Rhythm:Regular Rate:Normal     Neuro/Psych negative neurological ROS  negative psych ROS   GI/Hepatic negative GI ROS, Neg liver ROS,   Endo/Other  diabetes  Renal/GU negative Renal ROS  negative genitourinary   Musculoskeletal  (+) Arthritis ,   Abdominal   Peds negative pediatric ROS (+)  Hematology negative hematology ROS (+)   Anesthesia Other Findings   Reproductive/Obstetrics negative OB ROS                           Lab Results  Component Value Date   WBC 6.9 11/27/2014   HGB 15.3 11/27/2014   HCT 45.1 11/27/2014   MCV 92.2 11/27/2014   PLT 287 11/27/2014   Lab Results  Component Value Date   CREATININE 0.83 11/27/2014   BUN 20 11/27/2014   NA 138 11/27/2014   K 4.1 11/27/2014   CL 101 11/27/2014   CO2 28 11/27/2014    Anesthesia Physical  Anesthesia Plan  ASA: III  Anesthesia Plan: General   Post-op Pain Management:    Induction: Intravenous  Airway Management Planned: LMA  Additional Equipment:   Intra-op Plan:   Post-operative Plan: Extubation in OR  Informed Consent: I have reviewed the patients History and Physical, chart, labs and discussed the procedure including the risks, benefits and alternatives for the proposed anesthesia with the patient or authorized representative who has indicated his/her understanding and  acceptance.     Plan Discussed with: CRNA  Anesthesia Plan Comments:        Anesthesia Quick Evaluation

## 2014-12-04 NOTE — Transfer of Care (Signed)
Immediate Anesthesia Transfer of Care Note  Patient: Bryce Maldonado  Procedure(s) Performed: Procedure(s): A-Flutter (N/A)  Patient Location: Cath Lab  Anesthesia Type:General  Level of Consciousness: awake and alert   Airway & Oxygen Therapy: Patient Spontanous Breathing  Post-op Assessment: Report given to RN and Post -op Vital signs reviewed and stable  Post vital signs: Reviewed and stable  Last Vitals:  Filed Vitals:   12/04/14 1116  BP: 140/101  Pulse: 128  Temp: 36.3 C  Resp: 18    Complications: No apparent anesthesia complications

## 2014-12-04 NOTE — Discharge Instructions (Signed)
No driving for 4. No lifting over 5 lbs for 1 week. No sexual activity for 1 week. You may return to work in 1 week. Keep procedure site clean & dry. If you notice increased pain, swelling, bleeding or pus, call/return!  You may shower, but no soaking baths/hot tubs/pools for 1 week.

## 2014-12-04 NOTE — Interval H&P Note (Signed)
History and Physical Interval Note:  12/04/2014 2:12 PM  NASIRE REALI  has presented today for surgery, with the diagnosis of aflutter  The various methods of treatment have been discussed with the patient and family. After consideration of risks, benefits and other options for treatment, the patient has consented to  Procedure(s): A-Flutter (N/A) as a surgical intervention .  The patient's history has been reviewed, patient examined, no change in status, stable for surgery.  I have reviewed the patient's chart and labs.  Questions were answered to the patient's satisfaction.     Thompson Grayer

## 2014-12-04 NOTE — Anesthesia Procedure Notes (Signed)
Procedure Name: LMA Insertion Date/Time: 12/04/2014 2:22 PM Performed by: Rejeana Brock L Pre-anesthesia Checklist: Patient identified, Emergency Drugs available, Suction available, Patient being monitored and Timeout performed Patient Re-evaluated:Patient Re-evaluated prior to inductionOxygen Delivery Method: Circle system utilized Preoxygenation: Pre-oxygenation with 100% oxygen Intubation Type: IV induction Ventilation: Mask ventilation without difficulty LMA: LMA inserted LMA Size: 4.0 Number of attempts: 2 Placement Confirmation: positive ETCO2 and breath sounds checked- equal and bilateral Tube secured with: Tape Dental Injury: Teeth and Oropharynx as per pre-operative assessment

## 2014-12-07 ENCOUNTER — Encounter (HOSPITAL_COMMUNITY): Payer: Self-pay | Admitting: Internal Medicine

## 2014-12-07 LAB — GLUCOSE, CAPILLARY: GLUCOSE-CAPILLARY: 116 mg/dL — AB (ref 65–99)

## 2014-12-11 ENCOUNTER — Other Ambulatory Visit: Payer: Self-pay | Admitting: Internal Medicine

## 2014-12-30 ENCOUNTER — Ambulatory Visit: Payer: Medicare PPO | Admitting: Internal Medicine

## 2015-01-04 ENCOUNTER — Encounter: Payer: Self-pay | Admitting: Internal Medicine

## 2015-01-04 ENCOUNTER — Ambulatory Visit (INDEPENDENT_AMBULATORY_CARE_PROVIDER_SITE_OTHER): Payer: Medicare PPO | Admitting: Internal Medicine

## 2015-01-04 ENCOUNTER — Other Ambulatory Visit: Payer: Self-pay

## 2015-01-04 VITALS — BP 114/78 | HR 99 | Ht 72.0 in | Wt 222.4 lb

## 2015-01-04 DIAGNOSIS — I483 Typical atrial flutter: Secondary | ICD-10-CM | POA: Diagnosis not present

## 2015-01-04 DIAGNOSIS — I480 Paroxysmal atrial fibrillation: Secondary | ICD-10-CM

## 2015-01-04 NOTE — Progress Notes (Signed)
PCP: [name], MD Primary Cardiologist:  Dr Martinique  Bryce Maldonado is a 74 y.o. male who presents today for routine electrophysiology followup.  Since his recent ablation, the patient reports doing very well.  No further arrhythmias.  Denies procedure related complications.  Today, he denies symptoms of palpitations, chest pain, shortness of breath,  lower extremity edema, dizziness, presyncope, or syncope.  The patient is otherwise without complaint today.   Past Medical History  Diagnosis Date  . Spinal stenosis 2004    L4 radiculopathy, herniated nucleus pulposus L4-5  . Dyslipidemia   . Coronary artery disease     post CABG x4 -- in 1995 -- due to three-vessel coronary artery disease  . BPH (benign prostatic hyperplasia)     Elevated PSA  . Anemia   . Gout   . Diverticular disease   . Tinnitus   . Typical atrial flutter (HCC)     s/p ablation  . Childhood asthma   . Type II diabetes mellitus ([place])   . Osteoarthritis     "everywhere" (12/04/2014)  . Prostate cancer [facility]) 2012    Dr Karsten Ro  . First degree AV block    Past Surgical History  Procedure Laterality Date  . Salivary stone removal  2001  . Coronary artery bypass graft  10/21/1993    x4 -- using the LIM artery graft to the LAD artery, with saphenous vein grafts to the diagonal branch of the LAD, OM branch the left circumflex coronary artery, and the posterior descending branch of the RCA -- Est. EF of 65%-- Surgeon: Gaye Pollack, M.D.              . Colonoscopy      negative X3; Dr Carlean Purl  . Prostate biopsy  10/2008    "had radiation tx for prostate cancer"  . Total hip arthroplasty Right November 2010  . Microdiscectomy lumbar  2004    L4-5, Dr. Arnoldo Morale  . Flexible sigmoidoscopy  2005     external hemorrhoids  . Cardiac catheterization  02/10/2010    Est. EF of 60-65% -- Severe three-vessel obstructive atherosclerotic coronary artery disease -- All grafts were patent including left internal mammary  artery graft to left anterior descending coronary artery, saphenous vein graft to the diagonal, saphenous vein graft to obtuse marginal vessel, and saphenous vein graft to the posterior descending coronary artery -- Normal left ventricular function   . Total hip arthroplasty Left 03/2010    Dr Alvan Dame  . Electrophysiologic study N/A 10/01/2014    CTI ablation by Dr Curt Bears  . Atrial flutter ablation  12/04/2014  . Appendectomy  1943  . Back surgery    . Joint replacement    . Electrophysiologic study N/A 12/04/2014    repeat CTI ablation by Dr Rayann Heman    ROS- all systems are reviewed and negatives except as per HPI above  Current Outpatient Prescriptions  Medication Sig Dispense Refill  . allopurinol (ZYLOPRIM) 300 MG tablet Take 300 mg by mouth daily.    [name] aspirin EC 81 MG tablet Take 81 mg by mouth daily.    [name] ezetimibe (ZETIA) 10 MG tablet Take 10 mg by mouth at bedtime.     . fexofenadine (ALLEGRA) 180 MG tablet Take 180 mg by mouth every morning.     [name] glimepiride (AMARYL) 2 MG tablet TAKE 1 TABLET BY MOUTH DAILY BEFORE BREAKFAST 30 tablet 5  . glucose blood (ONETOUCH VERIO) test strip 1 each by Other route 2 (  two) times daily. Use to check  Blood sugars twice a day Dx E11.9 100 each 3  . Melatonin 5 MG TABS Take 5 mg by mouth at bedtime.    . metFORMIN (GLUCOPHAGE) 500 MG tablet TAKE 1 TABLET BY MOUTH TWICE DAILY WITH A MEAL 60 tablet 5  . Multiple Vitamins-Minerals (CENTRUM PO) Take 1 tablet by mouth daily.     Glory Rosebush DELICA LANCETS [id number] MISC Use to help check blood sugars twice a day Dx E11.9 100 each 3  . rosuvastatin (CRESTOR) 10 MG tablet Take 10 mg by mouth at bedtime.     [name] venlafaxine XR (EFFEXOR-XR) 75 MG 24 hr capsule Take 75 mg by mouth daily.     No current facility-administered medications for this visit.    Physical Exam: Filed Vitals:   01/04/15 1115  BP: 114/78  Pulse: 99  Height: 6' (1.829 m)  Weight: 222 lb 6.4 oz (100.88 kg)    GEN- The patient is well  appearing, alert and oriented x 3 today.   Head- normocephalic, atraumatic Eyes-  Sclera clear, conjunctiva pink Ears- hearing intact Oropharynx- clear Lungs- Clear to ausculation bilaterally, normal work of breathing Heart- Regular rate and rhythm, no murmurs, rubs or gallops, PMI not laterally displaced GI- soft, NT, ND, + BS Extremities- no clubbing, cyanosis, or edema  ekg today reveals sinus rhythm with first degree AV block  Assessment and Plan:  1. Atrial flutter Resolved s/p ablation Stop xarelto Resume asa 81mg  daily Dr Martinique to follow for atrial fibrillation in the future.  If he ever has afib, he may require restarting anticoagulation.  He has not had atrial fibrillation to date  2. First degree AV block Asymptomatic  3. CAD Resume ASA  Return to see me as needed Follow-up with Dr Martinique as scheduled   Thompson Grayer MD, [facility] 01/04/2015 11:57 AM

## 2015-01-04 NOTE — Patient Instructions (Signed)
Medication Instructions:  Your physician has recommended you make the following change in your medication:  1) Stop Xarelto 2) Restart Aspirin 81 mg daily   Labwork: None ordered   Testing/Procedures: None ordered   Follow-Up: Your physician recommends that you schedule a follow-up appointment as needed   Any Other Special Instructions Will Be Listed Below (If Applicable).     If you need a refill on your cardiac medications before your next appointment, please call your pharmacy.

## 2015-01-28 ENCOUNTER — Other Ambulatory Visit: Payer: Self-pay | Admitting: Internal Medicine

## 2015-02-02 ENCOUNTER — Emergency Department (HOSPITAL_BASED_OUTPATIENT_CLINIC_OR_DEPARTMENT_OTHER)
Admission: EM | Admit: 2015-02-02 | Discharge: 2015-02-02 | Disposition: A | Discharge status: Home / Self Care | Payer: Medicare Other | Attending: Emergency Medicine | Admitting: Emergency Medicine

## 2015-02-02 ENCOUNTER — Emergency Department (HOSPITAL_BASED_OUTPATIENT_CLINIC_OR_DEPARTMENT_OTHER): Payer: Medicare Other

## 2015-02-02 ENCOUNTER — Encounter (HOSPITAL_BASED_OUTPATIENT_CLINIC_OR_DEPARTMENT_OTHER): Payer: Self-pay | Admitting: *Deleted

## 2015-02-02 DIAGNOSIS — I483 Typical atrial flutter: Secondary | ICD-10-CM | POA: Insufficient documentation

## 2015-02-02 DIAGNOSIS — Z951 Presence of aortocoronary bypass graft: Secondary | ICD-10-CM | POA: Insufficient documentation

## 2015-02-02 DIAGNOSIS — I2510 Atherosclerotic heart disease of native coronary artery without angina pectoris: Secondary | ICD-10-CM | POA: Insufficient documentation

## 2015-02-02 DIAGNOSIS — Z8719 Personal history of other diseases of the digestive system: Secondary | ICD-10-CM | POA: Diagnosis not present

## 2015-02-02 DIAGNOSIS — Z862 Personal history of diseases of the blood and blood-forming organs and certain disorders involving the immune mechanism: Secondary | ICD-10-CM | POA: Insufficient documentation

## 2015-02-02 DIAGNOSIS — J069 Acute upper respiratory infection, unspecified: Secondary | ICD-10-CM | POA: Diagnosis not present

## 2015-02-02 DIAGNOSIS — E785 Hyperlipidemia, unspecified: Secondary | ICD-10-CM | POA: Insufficient documentation

## 2015-02-02 DIAGNOSIS — Z8679 Personal history of other diseases of the circulatory system: Secondary | ICD-10-CM | POA: Insufficient documentation

## 2015-02-02 DIAGNOSIS — R059 Cough, unspecified: Secondary | ICD-10-CM

## 2015-02-02 DIAGNOSIS — R05 Cough: Secondary | ICD-10-CM | POA: Diagnosis present

## 2015-02-02 DIAGNOSIS — Z79899 Other long term (current) drug therapy: Secondary | ICD-10-CM | POA: Insufficient documentation

## 2015-02-02 DIAGNOSIS — M109 Gout, unspecified: Secondary | ICD-10-CM | POA: Insufficient documentation

## 2015-02-02 DIAGNOSIS — Z8546 Personal history of malignant neoplasm of prostate: Secondary | ICD-10-CM | POA: Insufficient documentation

## 2015-02-02 DIAGNOSIS — J45901 Unspecified asthma with (acute) exacerbation: Secondary | ICD-10-CM | POA: Insufficient documentation

## 2015-02-02 DIAGNOSIS — Z8669 Personal history of other diseases of the nervous system and sense organs: Secondary | ICD-10-CM | POA: Insufficient documentation

## 2015-02-02 DIAGNOSIS — Z9889 Other specified postprocedural states: Secondary | ICD-10-CM | POA: Insufficient documentation

## 2015-02-02 DIAGNOSIS — Z7982 Long term (current) use of aspirin: Secondary | ICD-10-CM | POA: Diagnosis not present

## 2015-02-02 DIAGNOSIS — M1990 Unspecified osteoarthritis, unspecified site: Secondary | ICD-10-CM | POA: Insufficient documentation

## 2015-02-02 DIAGNOSIS — E119 Type 2 diabetes mellitus without complications: Secondary | ICD-10-CM | POA: Diagnosis not present

## 2015-02-02 DIAGNOSIS — Z87438 Personal history of other diseases of male genital organs: Secondary | ICD-10-CM | POA: Insufficient documentation

## 2015-02-02 DIAGNOSIS — Z87891 Personal history of nicotine dependence: Secondary | ICD-10-CM | POA: Insufficient documentation

## 2015-02-02 DIAGNOSIS — J40 Bronchitis, not specified as acute or chronic: Secondary | ICD-10-CM

## 2015-02-02 MED ORDER — IPRATROPIUM-ALBUTEROL 0.5-2.5 (3) MG/3ML IN SOLN
3.0000 mL | Freq: Once | RESPIRATORY_TRACT | Status: AC
  Administered 2015-02-02: 3 mL via RESPIRATORY_TRACT

## 2015-02-02 MED ORDER — AZITHROMYCIN 250 MG PO TABS: 250.0000 mg | ORAL_TABLET | Freq: Every day | ORAL | Status: DC

## 2015-02-02 MED ORDER — ALBUTEROL SULFATE (2.5 MG/3ML) 0.083% IN NEBU
2.5000 mg | INHALATION_SOLUTION | Freq: Once | RESPIRATORY_TRACT | Status: AC
  Administered 2015-02-02: 2.5 mg via RESPIRATORY_TRACT

## 2015-02-02 MED ORDER — ALBUTEROL SULFATE (2.5 MG/3ML) 0.083% IN NEBU
INHALATION_SOLUTION | RESPIRATORY_TRACT | Status: AC
  Administered 2015-02-02: 2.5 mg via RESPIRATORY_TRACT
  Filled 2015-02-02: qty 3

## 2015-02-02 MED ORDER — BENZONATATE 100 MG PO CAPS: 100.0000 mg | ORAL_CAPSULE | Freq: Three times a day (TID) | ORAL | Status: DC

## 2015-02-02 MED ORDER — ALBUTEROL SULFATE HFA 108 (90 BASE) MCG/ACT IN AERS
2.0000 | INHALATION_SPRAY | Freq: Once | RESPIRATORY_TRACT | Status: AC
  Administered 2015-02-02: 2 via RESPIRATORY_TRACT
  Filled 2015-02-02: qty 6.7

## 2015-02-02 MED ORDER — IPRATROPIUM-ALBUTEROL 0.5-2.5 (3) MG/3ML IN SOLN
RESPIRATORY_TRACT | Status: AC
  Administered 2015-02-02: 3 mL via RESPIRATORY_TRACT
  Filled 2015-02-02: qty 3

## 2015-02-02 MED FILL — BENZONATATE 100 MG CAPSULE: 100 | 7 days supply | Qty: 21 | Fill #0

## 2015-02-02 MED FILL — AZITHROMYCIN 250 MG TABLET: 250 | 5 days supply | Qty: 6 | Fill #0

## 2015-02-02 NOTE — ED Notes (Signed)
Patient transported to X-ray 

## 2015-02-02 NOTE — ED Notes (Signed)
Pt amb to triage with quick steady gait in nad. Pt reports cough x 3 days, denies any fevers, producing greyish green sputum, states he cannot sleep at night due to coughing.

## 2015-02-02 NOTE — ED Provider Notes (Signed)
CSN: [id number]     Arrival date & time 02/02/15  [id number] History   First MD Initiated Contact with Patient 02/02/15 1204     Chief Complaint  Patient presents with  . Cough     (Consider location/radiation/quality/duration/timing/severity/associated sxs/prior Treatment) Patient is a 75 y.o. male presenting with cough.  Cough Cough characteristics:  Productive Sputum characteristics:  Green and gray Severity:  Severe Onset quality:  Gradual Duration:  5 days Timing:  Constant Progression:  Worsening Chronicity:  New Relieved by:  Nothing Worsened by:  Nothing tried Ineffective treatments:  None tried (alkaselzer, mucinex, nyquil) Associated symptoms: rhinorrhea, sore throat and wheezing   Associated symptoms: no chest pain (with cough only), no fever, no headaches, no rash, no shortness of breath and no sinus congestion     Past Medical History  Diagnosis Date  . Spinal stenosis 2004    L4 radiculopathy, herniated nucleus pulposus L4-5  . Dyslipidemia   . Coronary artery disease     post CABG x4 -- in 1995 -- due to three-vessel coronary artery disease  . BPH (benign prostatic hyperplasia)     Elevated PSA  . Anemia   . Gout   . Diverticular disease   . Tinnitus   . Typical atrial flutter (HCC)     s/p ablation  . Childhood asthma   . Type II diabetes mellitus ([place])   . Osteoarthritis     "everywhere" (12/04/2014)  . Prostate cancer [facility]) 2012    Dr Karsten Ro  . First degree AV block    Past Surgical History  Procedure Laterality Date  . Salivary stone removal  2001  . Coronary artery bypass graft  10/21/1993    x4 -- using the LIM artery graft to the LAD artery, with saphenous vein grafts to the diagonal branch of the LAD, OM branch the left circumflex coronary artery, and the posterior descending branch of the RCA -- Est. EF of 65%-- Surgeon: Gaye Pollack, M.D.              . Colonoscopy      negative X3; Dr Carlean Purl  . Prostate biopsy  10/2008    "had radiation  tx for prostate cancer"  . Total hip arthroplasty Right November 2010  . Microdiscectomy lumbar  2004    L4-5, Dr. Arnoldo Morale  . Flexible sigmoidoscopy  2005     external hemorrhoids  . Cardiac catheterization  02/10/2010    Est. EF of 60-65% -- Severe three-vessel obstructive atherosclerotic coronary artery disease -- All grafts were patent including left internal mammary artery graft to left anterior descending coronary artery, saphenous vein graft to the diagonal, saphenous vein graft to obtuse marginal vessel, and saphenous vein graft to the posterior descending coronary artery -- Normal left ventricular function   . Total hip arthroplasty Left 03/2010    Dr Alvan Dame  . Electrophysiologic study N/A 10/01/2014    CTI ablation by Dr Curt Bears  . Atrial flutter ablation  12/04/2014  . Appendectomy  1943  . Back surgery    . Joint replacement    . Electrophysiologic study N/A 12/04/2014    repeat CTI ablation by Dr Rayann Heman   Family History  Problem Relation Age of Onset  . Diabetes Father   . Hypertension Father   . Lung cancer Father     smoker  . Heart failure Mother 35  . Hyperlipidemia Sister   . Coronary artery disease Sister   . Heart attack Paternal Aunt  X 2; both > 65  . Colon cancer Neg Hx   . Stroke Neg Hx    Social History  Substance Use Topics  . Smoking status: Former Smoker -- 2.50 packs/day for 15 years    Types: Cigarettes    Quit date: 01/30/1970  . Smokeless tobacco: Never Used     Comment: smoked 1959-1972  . Alcohol Use: 2.4 oz/week    4 Standard drinks or equivalent per week     Comment: 11/42016 "nothing to drink since 07/25/2014"    Review of Systems  Constitutional: Negative for fever.  HENT: Positive for rhinorrhea and sore throat.   Eyes: Negative for visual disturbance.  Respiratory: Positive for cough and wheezing. Negative for shortness of breath.   Cardiovascular: Negative for chest pain (with cough only).  Gastrointestinal: Negative for abdominal  pain.  Genitourinary: Negative for difficulty urinating.  Musculoskeletal: Negative for back pain and neck stiffness.  Skin: Negative for rash.  Neurological: Negative for syncope and headaches.      Allergies  Naproxen sodium  Home Medications   Prior to Admission medications   Medication Sig Start Date End Date Taking? Authorizing Provider  allopurinol (ZYLOPRIM) 300 MG tablet Take 300 mg by mouth daily.    Historical Provider, MD  aspirin EC 81 MG tablet Take 81 mg by mouth daily.    Historical Provider, MD  azithromycin (ZITHROMAX Z-PAK) 250 MG tablet Take 1 tablet (250 mg total) by mouth daily. Take 2 tablets on the first day followed by one tablet daily for 4 days 02/02/15   Gareth Morgan, MD  benzonatate (TESSALON) 100 MG capsule Take 1 capsule (100 mg total) by mouth every 8 (eight) hours. 02/02/15   Gareth Morgan, MD  ezetimibe (ZETIA) 10 MG tablet Take 10 mg by mouth at bedtime.     Historical Provider, MD  fexofenadine (ALLEGRA) 180 MG tablet Take 180 mg by mouth every morning.     Historical Provider, MD  glimepiride (AMARYL) 2 MG tablet TAKE 1 TABLET BY MOUTH DAILY BEFORE BREAKFAST 12/11/14   Hendricks Limes, MD  glucose blood [facility] VERIO) test strip 1 each by Other route 2 (two) times daily. Use to check  Blood sugars twice a day Dx E11.9 09/09/14   Hendricks Limes, MD  Melatonin 5 MG TABS Take 5 mg by mouth at bedtime.    Historical Provider, MD  metFORMIN (GLUCOPHAGE) 500 MG tablet TAKE 1 TABLET BY MOUTH TWICE DAILY WITH A MEAL 01/28/15   Hendricks Limes, MD  Multiple Vitamins-Minerals (CENTRUM PO) Take 1 tablet by mouth daily.     Historical Provider, MD  [facility] DELICA LANCETS [id number] MISC Use to help check blood sugars twice a day Dx E11.9 09/09/14   Hendricks Limes, MD  rosuvastatin (CRESTOR) 10 MG tablet Take 10 mg by mouth at bedtime.     Historical Provider, MD  venlafaxine XR (EFFEXOR-XR) 75 MG 24 hr capsule Take 75 mg by mouth daily.    Historical Provider,  MD   BP 125/77 mmHg  Pulse 114  Temp(Src) 97.7 F (36.5 C) (Oral)  Resp 18  Ht 6' (1.829 m)  Wt 225 lb (102.059 kg)  BMI 30.51 kg/m2  SpO2 95% Physical Exam  Constitutional: He is oriented to person, place, and time. He appears well-developed and well-nourished. No distress.  HENT:  Head: Normocephalic and atraumatic.  Eyes: Conjunctivae and EOM are normal.  Neck: Normal range of motion.  Cardiovascular: Normal rate, regular rhythm, normal heart  sounds and intact distal pulses.  Exam reveals no gallop and no friction rub.   No murmur heard. Pulmonary/Chest: Effort normal and breath sounds normal. No respiratory distress. He has no wheezes. He has no rales.  Abdominal: Soft. He exhibits no distension. There is no tenderness. There is no guarding.  Musculoskeletal: He exhibits no edema.  Neurological: He is alert and oriented to person, place, and time.  Skin: Skin is warm and dry. He is not diaphoretic.  Nursing note and vitals reviewed.   ED Course  Procedures (including critical care time) Labs Review Labs Reviewed - No data to display  Imaging Review Dg Chest 2 View  02/02/2015  CLINICAL DATA:  Patient having cough and congestion for 4 days. Ex-smoker. EXAM: CHEST  2 VIEW COMPARISON:  02/07/2010. FINDINGS: Normal cardiac and mediastinal silhouette. Previous CABG with median sternotomy. Mild scarring at the lung bases but no active infiltrates or failure. Lungs hyperaerated. No effusion or pneumothorax. No osseous findings of significance. IMPRESSION: No active cardiopulmonary disease.  Suspected COPD. Electronically Signed   By: Staci Righter M.D.   On: 02/02/2015 12:57   I have personally reviewed and evaluated these images and lab results as part of my medical decision-making.   EKG Interpretation None      MDM   Final diagnoses:  Cough  URI (upper respiratory infection)  Bronchitis, question of COPD on chest XR   75 year old male with history of coronary artery  disease, spinal stenosis, dyslipidemia, atrial flutter status post ablation, diabetes presents with concern of productive cough.  Chest x-ray shows no signs of pneumonia, and shows question of COPD.  Patient does not exhibit any wheezing on evaluation, however does have a cough which may be consistent with bronchospasm, remote hx smoking, reported improvement in ED after nebulizer, and given chest x-ray findings will treat for possible COPD/vs bacterial bronchitis with azithromycin and albuterol.  Given risk of prednisone use in pt with diabetes and no wheezing on exam and no prior COPD diagnosis, did not give steriods.  Pt with normal appetite, afebrile, no shortness of breath, hemodynamically stable and do not feel laboratory evaluation will change management.  Discussed reasons to return in detail.  Patient discharged in stable condition with understanding of reasons to return.   Gareth Morgan, MD 02/02/15 1902

## 2015-02-04 ENCOUNTER — Ambulatory Visit (INDEPENDENT_AMBULATORY_CARE_PROVIDER_SITE_OTHER): Payer: Medicare Other | Admitting: Internal Medicine

## 2015-02-04 ENCOUNTER — Encounter: Payer: Self-pay | Admitting: Internal Medicine

## 2015-02-04 ENCOUNTER — Other Ambulatory Visit (INDEPENDENT_AMBULATORY_CARE_PROVIDER_SITE_OTHER): Payer: Medicare Other

## 2015-02-04 VITALS — BP 144/72 | HR 83 | Temp 97.7°F | Resp 18 | Wt 225.0 lb

## 2015-02-04 DIAGNOSIS — IMO0002 Reserved for concepts with insufficient information to code with codable children: Secondary | ICD-10-CM

## 2015-02-04 DIAGNOSIS — E1365 Other specified diabetes mellitus with hyperglycemia: Secondary | ICD-10-CM

## 2015-02-04 DIAGNOSIS — J441 Chronic obstructive pulmonary disease with (acute) exacerbation: Secondary | ICD-10-CM

## 2015-02-04 DIAGNOSIS — E1351 Other specified diabetes mellitus with diabetic peripheral angiopathy without gangrene: Secondary | ICD-10-CM

## 2015-02-04 DIAGNOSIS — J209 Acute bronchitis, unspecified: Secondary | ICD-10-CM | POA: Diagnosis not present

## 2015-02-04 LAB — HEMOGLOBIN A1C: Hgb A1c MFr Bld: 6.9 % — ABNORMAL HIGH (ref 4.6–6.5)

## 2015-02-04 MED ORDER — HYDROCODONE-HOMATROPINE 5-1.5 MG/5ML PO SYRP: 5.0000 mL | ORAL_SOLUTION | Freq: Four times a day (QID) | ORAL | Status: DC | PRN

## 2015-02-04 MED ORDER — PREDNISONE 20 MG PO TABS: 40.0000 mg | ORAL_TABLET | Freq: Every day | ORAL | Status: DC

## 2015-02-04 NOTE — Progress Notes (Signed)
Subjective:    Patient ID: Bryce Maldonado, male    DOB: 08-23-1940, 75 y.o.   MRN: [id number]  HPI He is here for cold symptoms. His symptoms started 4-5 days ago. He is a very bad cough that is productive.  He is wheezing and has significant nasal congestion. He also states ear pain, body aches and headaches. His wife states that he has never been sick. He denies any fevers, chills, appetite change, sore throat, sinus pain, shortness breath, abdominal pain and lightheadedness. He did go to the emergency room 2 days ago and was prescribed a Z-Pak and Tessalon Per[place]. He has not seen any improvement in his wife feels he has gotten worse. He was also prescribed an albuterol inhaler and he has not noted any improvement with this. He has tried Alka-Seltzer cold medication, Mucinex and NyQuil.  He does have a history of smoking and a chest x-ray in the ED showed probable COPD.   Medications and allergies reviewed with patient and updated if appropriate.  Patient Active Problem List   Diagnosis Date Noted  . SVT (supraventricular tachycardia) ([place]) 12/04/2014  . Typical atrial flutter ([place]) 11/03/2014  . PAF (paroxysmal atrial fibrillation) ([place]) 08/13/2014  . DM (diabetes mellitus), secondary, uncontrolled, with peripheral vascular complications ([place]) [id number]  . History of gout 03/04/2009  . ATOPIC RHINITIS 03/04/2009  . PROSTATE CANCER, HX OF 03/04/2009  . Mixed hyperlipidemia 08/06/2006  . Coronary atherosclerosis 08/06/2006    Current Outpatient Prescriptions on File Prior to Visit  Medication Sig Dispense Refill  . allopurinol (ZYLOPRIM) 300 MG tablet Take 300 mg by mouth daily.    [name] aspirin EC 81 MG tablet Take 81 mg by mouth daily.    [name] azithromycin (ZITHROMAX Z-PAK) 250 MG tablet Take 1 tablet (250 mg total) by mouth daily. Take 2 tablets on the first day followed by one tablet daily for 4 days 6 tablet 0  . benzonatate (TESSALON) 100 MG capsule Take 1 capsule (100 mg total)  by mouth every 8 (eight) hours. 21 capsule 0  . ezetimibe (ZETIA) 10 MG tablet Take 10 mg by mouth at bedtime.     . fexofenadine (ALLEGRA) 180 MG tablet Take 180 mg by mouth every morning.     [name] glimepiride (AMARYL) 2 MG tablet TAKE 1 TABLET BY MOUTH DAILY BEFORE BREAKFAST 30 tablet 5  . glucose blood (ONETOUCH VERIO) test strip 1 each by Other route 2 (two) times daily. Use to check  Blood sugars twice a day Dx E11.9 100 each 3  . Melatonin 5 MG TABS Take 5 mg by mouth at bedtime.    . metFORMIN (GLUCOPHAGE) 500 MG tablet TAKE 1 TABLET BY MOUTH TWICE DAILY WITH A MEAL 60 tablet 5  . Multiple Vitamins-Minerals (CENTRUM PO) Take 1 tablet by mouth daily.     Glory Rosebush DELICA LANCETS [id number] MISC Use to help check blood sugars twice a day Dx E11.9 100 each 3  . rosuvastatin (CRESTOR) 10 MG tablet Take 10 mg by mouth at bedtime.     [name] venlafaxine XR (EFFEXOR-XR) 75 MG 24 hr capsule Take 75 mg by mouth daily.     No current facility-administered medications on file prior to visit.    Past Medical History  Diagnosis Date  . Spinal stenosis 2004    L4 radiculopathy, herniated nucleus pulposus L4-5  . Dyslipidemia   . Coronary artery disease     post CABG x4 -- in 1995 -- due to three-vessel  coronary artery disease  . BPH (benign prostatic hyperplasia)     Elevated PSA  . Anemia   . Gout   . Diverticular disease   . Tinnitus   . Typical atrial flutter (HCC)     s/p ablation  . Childhood asthma   . Type II diabetes mellitus ([place])   . Osteoarthritis     "everywhere" (12/04/2014)  . Prostate cancer [facility]) 2012    Dr Karsten Ro  . First degree AV block     Past Surgical History  Procedure Laterality Date  . Salivary stone removal  2001  . Coronary artery bypass graft  10/21/1993    x4 -- using the LIM artery graft to the LAD artery, with saphenous vein grafts to the diagonal branch of the LAD, OM branch the left circumflex coronary artery, and the posterior descending branch of the RCA --  Est. EF of 65%-- Surgeon: Gaye Pollack, M.D.              . Colonoscopy      negative X3; Dr Carlean Purl  . Prostate biopsy  10/2008    "had radiation tx for prostate cancer"  . Total hip arthroplasty Right November 2010  . Microdiscectomy lumbar  2004    L4-5, Dr. Arnoldo Morale  . Flexible sigmoidoscopy  2005     external hemorrhoids  . Cardiac catheterization  02/10/2010    Est. EF of 60-65% -- Severe three-vessel obstructive atherosclerotic coronary artery disease -- All grafts were patent including left internal mammary artery graft to left anterior descending coronary artery, saphenous vein graft to the diagonal, saphenous vein graft to obtuse marginal vessel, and saphenous vein graft to the posterior descending coronary artery -- Normal left ventricular function   . Total hip arthroplasty Left 03/2010    Dr Alvan Dame  . Electrophysiologic study N/A 10/01/2014    CTI ablation by Dr Curt Bears  . Atrial flutter ablation  12/04/2014  . Appendectomy  1943  . Back surgery    . Joint replacement    . Electrophysiologic study N/A 12/04/2014    repeat CTI ablation by Dr Rayann Heman    Social History   Social History  . Marital Status: Married    Spouse Name: N/A  . Number of Children: N/A  . Years of Education: N/A   Social History Main Topics  . Smoking status: Former Smoker -- 2.50 packs/day for 15 years    Types: Cigarettes    Quit date: 01/30/1970  . Smokeless tobacco: Never Used     Comment: smoked 1959-1972  . Alcohol Use: 2.4 oz/week    4 Standard drinks or equivalent per week     Comment: 11/42016 "nothing to drink since 07/25/2014"  . Drug Use: No  . Sexual Activity: Not Currently   Other Topics Concern  . Not on file   Social History Narrative   Pt lives in [place] with spouse. 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  Enjoys reading and walking.    Family History  Problem Relation Age of Onset  . Diabetes Father   . Hypertension Father   . Lung cancer Father     smoker  . Heart  failure Mother 53  . Hyperlipidemia Sister   . Coronary artery disease Sister   . Heart attack Paternal Aunt     X 2; both > 65  . Colon cancer Neg Hx   . Stroke Neg Hx     Review of Systems  Constitutional: Negative for fever, chills and appetite  change.  HENT: Positive for congestion and ear pain (intermittent). Negative for sinus pressure and sore throat.   Respiratory: Positive for cough (productive) and wheezing. Negative for shortness of breath.   Gastrointestinal: Negative for nausea, abdominal pain and diarrhea.  Musculoskeletal: Positive for myalgias.  Neurological: Positive for headaches. Negative for dizziness and light-headedness.       Objective:   Filed Vitals:   02/04/15 0925  BP: 144/72  Pulse: 83  Temp: 97.7 F (36.5 C)  Resp: 18   Filed Weights   02/04/15 0925  Weight: 225 lb (102.059 kg)   Body mass index is 30.51 kg/(m^2).   Physical Exam  Constitutional: He appears well-developed and well-nourished. No distress.  HENT:  Head: Normocephalic and atraumatic.  Right Ear: External ear normal.  Left Ear: External ear normal.  Mouth/Throat: Oropharynx is clear and moist. No oropharyngeal exudate.  Normal ear canals and tympanic membranes  Neck: Neck supple. No tracheal deviation present. No thyromegaly present.  Cardiovascular: Normal rate and regular rhythm.   Pulmonary/Chest: Effort normal. No stridor. No respiratory distress. He has wheezes. He has no rales.  Musculoskeletal: He exhibits no edema.  Lymphadenopathy:    He has no cervical adenopathy.        Assessment & Plan:   Bronchitis, COPD exacerbation He sometimes they're related to COPD exacerbation-CRP is no sinus Continue zpak -explained that this is a 10 day antibiotic Start prednisone 40 mg daily for 5 days. He does have diabetes and her sugars have been fairly controlled at home recently, but explaining that this will increase his sugars for several days. Stressed that he needs to  be very compliant with a diabetic diet during this time. He should call with any questions or concerns Continue albuterol inhaler Hycodan cough syrup prescribed for nighttime Can continue Tessalon during the day Call or return if no improvement  A1c ordered-he has not had this checked in a while Advised him to make a follow-up appointment to get established and review his chronic medical problems

## 2015-02-04 NOTE — Patient Instructions (Addendum)
Continue taking the zpak and use the albuterol inhaler.  Start the prednisone as prescribed and use the cough syrup as needed.  Be very careful with what you are eating because the prednisone with increase your sugars.  Take the prednisone with food and in the morning.    Your prescription(s) have been submitted to your pharmacy or been printed and provided for you. Please take as directed and contact our office if you believe you are having problem(s) with the medication(s) or have any questions.  If your symptoms worsen or fail to improve, please contact our office for further instruction, or in case of emergency go directly to the emergency room at the closest medical facility.   General Recommendations:    Please drink plenty of fluids.  Get plenty of rest   Sleep in humidified air  Use saline nasal sprays  Netti pot  OTC Medications:  Decongestants - helps relieve congestion   Flonase (generic fluticasone) or Nasacort (generic triamcinolone) - please make sure to use the "cross-over" technique at a 45 degree angle towards the opposite eye as opposed to straight up the nasal passageway.   Sudafed (generic pseudoephedrine - Note this is the one that is available behind the pharmacy counter); Products with phenylephrine (-PE) may also be used but is often not as effective as pseudoephedrine.   If you have HIGH BLOOD PRESSURE - Coricidin HBP; AVOID any product that is -D as this contains pseudoephedrine which may increase your blood pressure.  Afrin (oxymetazoline) every 6-8 hours for up to 3 days.  Allergies - helps relieve runny nose, itchy eyes and sneezing   Claritin (generic loratidine), Allegra (fexofenidine), or Zyrtec (generic cyrterizine) for runny nose. These medications should not cause drowsiness.  Note - Benadryl (generic diphenhydramine) may be used however may cause drowsiness  Cough -   Delsym or Robitussin (generic  dextromethorphan)  Expectorants - helps loosen mucus to ease removal   Mucinex (generic guaifenesin) as directed on the package.  Headaches / General Aches   Tylenol (generic acetaminophen) - DO NOT EXCEED 3 grams (3,000 mg) in a 24 hour time period  Advil/Motrin (generic ibuprofen)  Sore Throat -   Salt water gargle   Chloraseptic (generic benzocaine) spray or lozenges / Sucrets (generic dyclonine)

## 2015-02-04 NOTE — Progress Notes (Signed)
Pre visit review using our clinic review tool, if applicable. No additional management support is needed unless otherwise documented below in the visit note. 

## 2015-02-05 ENCOUNTER — Ambulatory Visit: Payer: Medicare PPO | Admitting: Cardiology

## 2015-02-18 ENCOUNTER — Ambulatory Visit (INDEPENDENT_AMBULATORY_CARE_PROVIDER_SITE_OTHER): Payer: Medicare Other | Admitting: Internal Medicine

## 2015-02-18 ENCOUNTER — Other Ambulatory Visit (INDEPENDENT_AMBULATORY_CARE_PROVIDER_SITE_OTHER): Payer: Medicare Other

## 2015-02-18 ENCOUNTER — Encounter: Payer: Self-pay | Admitting: Internal Medicine

## 2015-02-18 VITALS — BP 140/70 | HR 98 | Temp 97.5°F | Wt 221.0 lb

## 2015-02-18 DIAGNOSIS — Z8546 Personal history of malignant neoplasm of prostate: Secondary | ICD-10-CM | POA: Diagnosis not present

## 2015-02-18 DIAGNOSIS — R300 Dysuria: Secondary | ICD-10-CM

## 2015-02-18 LAB — URINALYSIS
BILIRUBIN URINE: NEGATIVE
HGB URINE DIPSTICK: NEGATIVE
Ketones, ur: NEGATIVE
Leukocytes, UA: NEGATIVE
Nitrite: NEGATIVE
Specific Gravity, Urine: 1.025 (ref 1.000–1.030)
Total Protein, Urine: NEGATIVE
URINE GLUCOSE: NEGATIVE
UROBILINOGEN UA: 0.2 (ref 0.0–1.0)
pH: 5.5 (ref 5.0–8.0)

## 2015-02-18 MED ORDER — CIPROFLOXACIN HCL 500 MG PO TABS: 500.0000 mg | ORAL_TABLET | Freq: Two times a day (BID) | ORAL | Status: DC

## 2015-02-18 NOTE — Assessment & Plan Note (Signed)
R/o UTI UA Cipro x 10 d F/u w/Dr Karsten Ro

## 2015-02-18 NOTE — Progress Notes (Signed)
Pre visit review using our clinic review tool, if applicable. No additional management support is needed unless otherwise documented below in the visit note. 

## 2015-02-18 NOTE — Assessment & Plan Note (Signed)
Pt has regular checks - NE

## 2015-02-18 NOTE — Progress Notes (Signed)
Subjective:  Patient ID: Bryce Maldonado, male    DOB: [birth year]  Age: 75 y.o. MRN: [id number]  CC: No chief complaint on file.   HPI Bryce Maldonado presents for dysuria  - burning w/peeing x 4 d - not better. Pt has DM. H/o prostate ca  Outpatient Prescriptions Prior to Visit  Medication Sig Dispense Refill  . allopurinol (ZYLOPRIM) 300 MG tablet Take 300 mg by mouth daily.    [name] aspirin EC 81 MG tablet Take 81 mg by mouth daily.    [name] azithromycin (ZITHROMAX Z-PAK) 250 MG tablet Take 1 tablet (250 mg total) by mouth daily. Take 2 tablets on the first day followed by one tablet daily for 4 days 6 tablet 0  . benzonatate (TESSALON) 100 MG capsule Take 1 capsule (100 mg total) by mouth every 8 (eight) hours. 21 capsule 0  . ezetimibe (ZETIA) 10 MG tablet Take 10 mg by mouth at bedtime.     . fexofenadine (ALLEGRA) 180 MG tablet Take 180 mg by mouth every morning.     [name] glimepiride (AMARYL) 2 MG tablet TAKE 1 TABLET BY MOUTH DAILY BEFORE BREAKFAST 30 tablet 5  . glucose blood (ONETOUCH VERIO) test strip 1 each by Other route 2 (two) times daily. Use to check  Blood sugars twice a day Dx E11.9 100 each 3  . HYDROcodone-homatropine (HYCODAN) 5-1.5 MG/5ML syrup Take 5 mLs by mouth every 6 (six) hours as needed for cough. 120 mL 0  . Melatonin 5 MG TABS Take 5 mg by mouth at bedtime.    . metFORMIN (GLUCOPHAGE) 500 MG tablet TAKE 1 TABLET BY MOUTH TWICE DAILY WITH A MEAL 60 tablet 5  . Multiple Vitamins-Minerals (CENTRUM PO) Take 1 tablet by mouth daily.     Bryce Maldonado DELICA LANCETS 99991111 MISC Use to help check blood sugars twice a day Dx E11.9 100 each 3  . predniSONE (DELTASONE) 20 MG tablet Take 2 tablets (40 mg total) by mouth daily with breakfast. 10 tablet 0  . rosuvastatin (CRESTOR) 10 MG tablet Take 10 mg by mouth at bedtime.     [name] venlafaxine XR (EFFEXOR-XR) 75 MG 24 hr capsule Take 75 mg by mouth daily.     No facility-administered medications prior to visit.    ROS Review of  Systems  Constitutional: Negative for appetite change, fatigue and unexpected weight change.  HENT: Negative for congestion, nosebleeds, sneezing, sore throat and trouble swallowing.   Eyes: Negative for itching and visual disturbance.  Respiratory: Negative for cough.   Cardiovascular: Negative for chest pain, palpitations and leg swelling.  Gastrointestinal: Negative for nausea, diarrhea, blood in stool and abdominal distention.  Genitourinary: Positive for dysuria. Negative for frequency and hematuria.  Musculoskeletal: Negative for back pain, joint swelling, gait problem and neck pain.  Skin: Negative for rash.  Neurological: Negative for dizziness, tremors, speech difficulty and weakness.  Psychiatric/Behavioral: Negative for sleep disturbance, dysphoric mood and agitation. The patient is not nervous/anxious.     Objective:  BP 140/70 mmHg  Pulse 98  Temp(Src) 97.5 F (36.4 C) (Oral)  Wt 221 lb (100.245 kg)  SpO2 96%  BP Readings from Last 3 Encounters:  02/18/15 140/70  02/04/15 144/72  02/02/15 125/77    Wt Readings from Last 3 Encounters:  02/18/15 221 lb (100.245 kg)  02/04/15 225 lb (102.059 kg)  02/02/15 225 lb (102.059 kg)    Physical Exam  Constitutional: He is oriented to person, place, and time. He appears  well-developed. No distress.  NAD  HENT:  Mouth/Throat: Oropharynx is clear and moist.  Eyes: Conjunctivae are normal. Pupils are equal, round, and reactive to light.  Neck: Normal range of motion. No JVD present. No thyromegaly present.  Cardiovascular: Normal rate, regular rhythm, normal heart sounds and intact distal pulses.  Exam reveals no gallop and no friction rub.   No murmur heard. Pulmonary/Chest: Effort normal and breath sounds normal. No respiratory distress. He has no wheezes. He has no rales. He exhibits no tenderness.  Abdominal: Soft. Bowel sounds are normal. He exhibits no distension and no mass. There is no tenderness. There is no rebound  and no guarding.  Musculoskeletal: Normal range of motion. He exhibits no edema or tenderness.  Lymphadenopathy:    He has no cervical adenopathy.  Neurological: He is alert and oriented to person, place, and time. He has normal reflexes. No cranial nerve deficit. He exhibits normal muscle tone. He displays a negative Romberg sign. Coordination and gait normal.  Skin: Skin is warm and dry. No rash noted.  Psychiatric: He has a normal mood and affect. His behavior is normal. Judgment and thought content normal.    Lab Results  Component Value Date   WBC 6.9 11/27/2014   HGB 15.3 11/27/2014   HCT 45.1 11/27/2014   PLT 287 11/27/2014   GLUCOSE 141* 11/27/2014   CHOL 146 08/13/2014   TRIG 136 08/13/2014   HDL 53 08/13/2014   LDLDIRECT 86.2 06/24/2010   LDLCALC 66 08/13/2014   ALT 29 08/13/2014   AST 19 08/13/2014   NA 138 11/27/2014   K 4.1 11/27/2014   CL 101 11/27/2014   CREATININE 0.83 11/27/2014   BUN 20 11/27/2014   CO2 28 11/27/2014   TSH 1.129 08/13/2014   PSA 6.12* 08/06/2006   INR 0.97 03/04/2010   HGBA1C 6.9* 02/04/2015   MICROALBUR 1.2 08/11/2013    Dg Chest 2 View  02/02/2015  CLINICAL DATA:  Patient having cough and congestion for 4 days. Ex-smoker. EXAM: CHEST  2 VIEW COMPARISON:  02/07/2010. FINDINGS: Normal cardiac and mediastinal silhouette. Previous CABG with median sternotomy. Mild scarring at the lung bases but no active infiltrates or failure. Lungs hyperaerated. No effusion or pneumothorax. No osseous findings of significance. IMPRESSION: No active cardiopulmonary disease.  Suspected COPD. Electronically Signed   By: Staci Righter M.D.   On: 02/02/2015 12:57    Assessment & Plan:   There are no diagnoses linked to this encounter. I am having Bryce Maldonado maintain his Multiple Vitamins-Minerals (CENTRUM PO), fexofenadine, glucose blood, ONETOUCH DELICA LANCETS [id number], rosuvastatin, ezetimibe, allopurinol, venlafaxine XR, Melatonin, glimepiride, aspirin EC,  metFORMIN, azithromycin, benzonatate, predniSONE, and HYDROcodone-homatropine.  No orders of the defined types were placed in this encounter.     Follow-up: No Follow-up on file.  Walker Kehr, MD

## 2015-04-01 ENCOUNTER — Encounter: Payer: Self-pay | Admitting: Cardiology

## 2015-04-01 ENCOUNTER — Ambulatory Visit (INDEPENDENT_AMBULATORY_CARE_PROVIDER_SITE_OTHER): Payer: Medicare Other | Admitting: Cardiology

## 2015-04-01 VITALS — BP 148/86 | HR 82 | Ht 72.0 in | Wt 223.0 lb

## 2015-04-01 DIAGNOSIS — E1351 Other specified diabetes mellitus with diabetic peripheral angiopathy without gangrene: Secondary | ICD-10-CM | POA: Diagnosis not present

## 2015-04-01 DIAGNOSIS — I480 Paroxysmal atrial fibrillation: Secondary | ICD-10-CM | POA: Diagnosis not present

## 2015-04-01 DIAGNOSIS — E782 Mixed hyperlipidemia: Secondary | ICD-10-CM

## 2015-04-01 DIAGNOSIS — E1365 Other specified diabetes mellitus with hyperglycemia: Secondary | ICD-10-CM

## 2015-04-01 DIAGNOSIS — IMO0002 Reserved for concepts with insufficient information to code with codable children: Secondary | ICD-10-CM

## 2015-04-01 DIAGNOSIS — I25810 Atherosclerosis of coronary artery bypass graft(s) without angina pectoris: Secondary | ICD-10-CM

## 2015-04-01 NOTE — Patient Instructions (Signed)
Continue your current therapy  I will see you in 6 months.   

## 2015-04-01 NOTE — Progress Notes (Signed)
[facility]. [address] [place], [place]  [address] Phone: [phone] Fax:  [phone]  Date:  04/01/2015   ID:  Bryce Maldonado, DOB 11/04/1940, MRN [id number]  PCP:  Binnie Rail, MD  Cardiologist:  Dr. Carissa Musick [place]     History of Present Illness: Bryce Maldonado is a 75 y.o. male is seen for follow up CAD and atrial flutter.   He has a hx of CAD, s/p CABG in 1995, HL, prostate CA, DJD, spinal stenosis. Prior to left total hip replacement in 03/2010 he underwent a Lexiscan Myoview. This demonstrated an EF of 66% and inferior ischemia. LHC 01/2010: Proximal LAD 90%, proximal OM totally occluded, RCA occluded, SVG-PDA patent, SVG-diagonal patent, SVG-OM patent, LIMA-LAD patent. There were left to right collaterals. EF 60-65%. He was treated medically.   On his last visit he had new onset atrial flutter. He was anticoagulated and underwent Ablation by Dr. Rayann Heman on October 01, 2014. He had repeat ablation on Nov. 4. No recurrent arrhythmia since then Dr. Rayann Heman stopped his anticoagulation. He feels great today. Walks daily. No chest pain, SOB, or palpitations.  Wt Readings from Last 3 Encounters:  04/01/15 101.152 kg (223 lb)  02/18/15 100.245 kg (221 lb)  02/04/15 102.059 kg (225 lb)     Past Medical History  Diagnosis Date  . Spinal stenosis 2004    L4 radiculopathy, herniated nucleus pulposus L4-5  . Dyslipidemia   . Coronary artery disease     post CABG x4 -- in 1995 -- due to three-vessel coronary artery disease  . BPH (benign prostatic hyperplasia)     Elevated PSA  . Anemia   . Gout   . Diverticular disease   . Tinnitus   . Typical atrial flutter (HCC)     s/p ablation  . Childhood asthma   . Type II diabetes mellitus ([place])   . Osteoarthritis     "everywhere" (12/04/2014)  . Prostate cancer [facility]) 2012    Dr Karsten Ro  . First degree AV block     Current Outpatient Prescriptions  Medication Sig Dispense Refill  . allopurinol (ZYLOPRIM) 300 MG tablet  Take 300 mg by mouth daily.    [name] aspirin EC 81 MG tablet Take 81 mg by mouth daily.    . ciprofloxacin (CIPRO) 500 MG tablet Take 1 tablet (500 mg total) by mouth 2 (two) times daily. 20 tablet 0  . ezetimibe (ZETIA) 10 MG tablet Take 10 mg by mouth at bedtime.     . fexofenadine (ALLEGRA) 180 MG tablet Take 180 mg by mouth every morning.     [name] glimepiride (AMARYL) 2 MG tablet TAKE 1 TABLET BY MOUTH DAILY BEFORE BREAKFAST 30 tablet 5  . glucose blood (ONETOUCH VERIO) test strip 1 each by Other route 2 (two) times daily. Use to check  Blood sugars twice a day Dx E11.9 100 each 3  . Melatonin 5 MG TABS Take 5 mg by mouth at bedtime.    . metFORMIN (GLUCOPHAGE) 500 MG tablet TAKE 1 TABLET BY MOUTH TWICE DAILY WITH A MEAL 60 tablet 5  . Multiple Vitamins-Minerals (CENTRUM PO) Take 1 tablet by mouth daily.     Glory Rosebush DELICA LANCETS 99991111 MISC Use to help check blood sugars twice a day Dx E11.9 100 each 3  . rosuvastatin (CRESTOR) 10 MG tablet Take 10 mg by mouth at bedtime.     [name] venlafaxine XR (EFFEXOR-XR) 75 MG 24 hr capsule Take 75 mg by  mouth daily.     No current facility-administered medications for this visit.    Allergies:    Allergies  Allergen Reactions  . Naproxen Sodium     rash    Social History:  The patient  reports that he quit smoking about 45 years ago. His smoking use included Cigarettes. He has a 37.5 pack-year smoking history. He has never used smokeless tobacco. He reports that he drinks about 2.4 oz of alcohol per week. He reports that he does not use illicit drugs.   ROS:  Please see the history of present illness.      All other systems reviewed and negative.   PHYSICAL EXAM: VS:  BP 148/86 mmHg  Pulse 82  Ht 6' (1.829 m)  Wt 101.152 kg (223 lb)  BMI 30.24 kg/m2 Well nourished, well developed, in no acute distress HEENT: normal Neck: no JVD Vascular:  No carotid bruits Cardiac: IRRR,  normal S1, S2; RRR; no murmur Lungs:  clear to auscultation  bilaterally, no wheezing, rhonchi or rales Abd: soft, nontender, no hepatomegaly Ext: no edema Skin: warm and dry Neuro:  CNs 2-12 intact, no focal abnormalities noted  EKG:  Today- NSR rate 82, first degree AV block. Nonspecific TWA. I have personally reviewed and interpreted this study.  Lab Results  Component Value Date   WBC 6.9 11/27/2014   HGB 15.3 11/27/2014   HCT 45.1 11/27/2014   PLT 287 11/27/2014   GLUCOSE 141* 11/27/2014   CHOL 146 08/13/2014   TRIG 136 08/13/2014   HDL 53 08/13/2014   LDLDIRECT 86.2 06/24/2010   LDLCALC 66 08/13/2014   ALT 29 08/13/2014   AST 19 08/13/2014   NA 138 11/27/2014   K 4.1 11/27/2014   CL 101 11/27/2014   CREATININE 0.83 11/27/2014   BUN 20 11/27/2014   CO2 28 11/27/2014   TSH 1.129 08/13/2014   PSA 6.12* 08/06/2006   INR 0.97 03/04/2010   HGBA1C 6.9* 02/04/2015   MICROALBUR 1.2 08/11/2013     ASSESSMENT AND PLAN:  1. CAD: s/p CABG 1995.  He is active without anginal symptoms.  He had a LHC in 2012 that demonstrated patent bypass grafts.  Continue medical Rx.  Continue  statin.  2. Atrial flutter- s/p successful ablation. Maintaining NSR. Will observe for recurrence of flutter or fib.   3. Hyperlipidemia:  Well controlled on statin.  I will follow up in 6 months.  Signed, Josiel Gahm [place] MD, [facility]

## 2015-05-07 ENCOUNTER — Encounter: Payer: Self-pay | Admitting: Internal Medicine

## 2015-06-08 ENCOUNTER — Other Ambulatory Visit: Payer: Self-pay | Admitting: Internal Medicine

## 2015-06-29 ENCOUNTER — Other Ambulatory Visit: Payer: Self-pay | Admitting: *Deleted

## 2015-06-29 MED ORDER — ROSUVASTATIN CALCIUM 10 MG PO TABS: 10.0000 mg | ORAL_TABLET | Freq: Every day | ORAL | Status: DC

## 2015-06-29 MED ORDER — VENLAFAXINE HCL ER 75 MG PO CP24: 75.0000 mg | ORAL_CAPSULE | Freq: Every day | ORAL | Status: DC

## 2015-07-01 ENCOUNTER — Other Ambulatory Visit: Payer: Self-pay | Admitting: *Deleted

## 2015-07-18 ENCOUNTER — Other Ambulatory Visit: Payer: Self-pay | Admitting: Internal Medicine

## 2015-08-02 LAB — HM DIABETES EYE EXAM

## 2015-09-04 ENCOUNTER — Other Ambulatory Visit: Payer: Self-pay | Admitting: Internal Medicine

## 2015-09-26 ENCOUNTER — Encounter: Payer: Self-pay | Admitting: Internal Medicine

## 2015-09-26 NOTE — Progress Notes (Signed)
Subjective:    Patient ID: Bryce Maldonado, male    DOB: 1940-08-28, 75 y.o.   MRN: [id number]  HPI Here for medicare wellness exam and annual physical exam.   I have personally reviewed and have noted 1.The patient's medical and social history 2.Their use of alcohol, tobacco or illicit drugs 3.Their current medications and supplements 4.The patient's functional ability including ADL's, fall risks, home safety risks and hearing or visual impairment. 5.Diet and physical activities 6.Evidence for depression or mood disorders 7.Care team reviewed and updated:  Urology - Dr Desma Paganini,  Cardio - Dr Martinique   Are there smokers in your home (other than you)? No  Risk Factors Exercise: walking daily Dietary issues discussed: well balanced, avoids sweets, decreasing carbs; stopped drinking diet sodas   Cardiac risk factors: advanced age, hypertension, hyperlipidemia, and obesity (BMI >= 30 kg/m2).  Depression Screen  Have you felt down, depressed or hopeless? No  Have you felt little interest or pleasure in doing things?  No  Activities of Daily Living In your present state of health, do you have any difficulty performing the following activities?:  Driving? No Managing money?  No Feeding yourself? No Getting from bed to chair? No Climbing a flight of stairs? No Preparing food and eating?: No Bathing or showering? No Getting dressed: No Getting to/using the toilet? No Moving around from place to place: No In the past year have you fallen or had a near fall?: No   Are you sexually active?  No  Do you have more than one partner?  N/A  Hearing Difficulties: No Do you often ask people to speak up or repeat themselves? No Do you experience ringing or noises in your ears? Yes Do you have difficulty understanding soft or whispered voices? Yes Vision:              Any change in vision:  No              Up to date with  eye exam:   Yes  Memory:  Do you feel that you have a problem with memory? No  Do you often misplace items? No  Do you feel safe at home?  Yes  Cognitive Testing  Alert, Orientated? Yes  Normal Appearance? Yes  Recall of three objects?  Yes  Can perform simple calculations? Yes  Displays appropriate judgment? Yes  Can read the correct time from a watch face? Yes   Advanced Directives have been discussed with the patient? Yes   Medications and allergies reviewed with patient and updated if appropriate.  Patient Active Problem List   Diagnosis Date Noted  . SVT (supraventricular tachycardia) ([place]) 12/04/2014  . Typical atrial flutter ([place]) 11/03/2014  . PAF (paroxysmal atrial fibrillation) ([place]) 08/13/2014  . DM (diabetes mellitus), secondary, uncontrolled, with peripheral vascular complications ([place]) 123XX123  . History of gout 03/04/2009  . ATOPIC RHINITIS 03/04/2009  . PROSTATE CANCER, HX OF 03/04/2009  . Mixed hyperlipidemia 08/06/2006  . Coronary atherosclerosis 08/06/2006    Current Outpatient Prescriptions on File Prior to Visit  Medication Sig Dispense Refill  . allopurinol (ZYLOPRIM) 300 MG tablet Take 300 mg by mouth daily.    [name] aspirin EC 81 MG tablet Take 81 mg by mouth daily.    [name] ezetimibe (ZETIA) 10 MG tablet Take 10 mg by mouth at bedtime.     . fexofenadine (ALLEGRA) 180 MG tablet Take 180 mg by mouth every morning.     [name] glimepiride (AMARYL)  2 MG tablet TAKE 1 TABLET BY MOUTH DAILY BEFORE BREAKFAST 30 tablet 5  . glucose blood (ONETOUCH VERIO) test strip 1 each by Other route 2 (two) times daily. Use to check  Blood sugars twice a day Dx E11.9 100 each 3  . Melatonin 5 MG TABS Take 5 mg by mouth at bedtime.    . metFORMIN (GLUCOPHAGE) 500 MG tablet TAKE 1 TABLET BY MOUTH TWICE DAILY WITH A MEAL 60 tablet 6  . Multiple Vitamins-Minerals (CENTRUM PO) Take 1 tablet by mouth daily.     Glory Rosebush DELICA LANCETS 99991111 MISC Use to help check blood sugars twice a  day Dx E11.9 100 each 3  . rosuvastatin (CRESTOR) 10 MG tablet Take 1 tablet (10 mg total) by mouth at bedtime. 90 tablet 2  . venlafaxine XR (EFFEXOR-XR) 75 MG 24 hr capsule Take 1 capsule (75 mg total) by mouth daily. 90 capsule 2   No current facility-administered medications on file prior to visit.     Past Medical History:  Diagnosis Date  . Anemia   . BPH (benign prostatic hyperplasia)    Elevated PSA  . Childhood asthma   . Coronary artery disease    post CABG x4 -- in 1995 -- due to three-vessel coronary artery disease  . Diverticular disease   . Dyslipidemia   . First degree AV block   . Gout   . Osteoarthritis    "everywhere" (12/04/2014)  . Prostate cancer [facility]) 2012   Dr Karsten Ro  . Spinal stenosis 2004   L4 radiculopathy, herniated nucleus pulposus L4-5  . Tinnitus   . Type II diabetes mellitus ([place])   . Typical atrial flutter [facility])    s/p ablation    Past Surgical History:  Procedure Laterality Date  . APPENDECTOMY  1943  . ATRIAL FLUTTER ABLATION  12/04/2014  . BACK SURGERY    . CARDIAC CATHETERIZATION  02/10/2010   Est. EF of 60-65% -- Severe three-vessel obstructive atherosclerotic coronary artery disease -- All grafts were patent including left internal mammary artery graft to left anterior descending coronary artery, saphenous vein graft to the diagonal, saphenous vein graft to obtuse marginal vessel, and saphenous vein graft to the posterior descending coronary artery -- Normal left ventricular function   . COLONOSCOPY     negative X3; Dr Carlean Purl  . CORONARY ARTERY BYPASS GRAFT  10/21/1993   x4 -- using the LIM artery graft to the LAD artery, with saphenous vein grafts to the diagonal branch of the LAD, OM branch the left circumflex coronary artery, and the posterior descending branch of the RCA -- Est. EF of 65%-- Surgeon: Gaye Pollack, M.D.              . ELECTROPHYSIOLOGIC STUDY N/A 10/01/2014   CTI ablation by Dr Curt Bears  . ELECTROPHYSIOLOGIC STUDY  N/A 12/04/2014   repeat CTI ablation by Dr Rayann Heman  . FLEXIBLE SIGMOIDOSCOPY  2005    external hemorrhoids  . JOINT REPLACEMENT    . MICRODISCECTOMY LUMBAR  2004   L4-5, Dr. Arnoldo Morale  . PROSTATE BIOPSY  10/2008   "had radiation tx for prostate cancer"  . SALIVARY STONE REMOVAL  2001  . TOTAL HIP ARTHROPLASTY Right November 2010  . TOTAL HIP ARTHROPLASTY Left 03/2010   Dr Alvan Dame    Social History   Social History  . Marital status: Married    Spouse name: N/A  . Number of children: N/A  . Years of education: N/A   Social  History Main Topics  . Smoking status: Former Smoker    Packs/day: 2.50    Years: 15.00    Types: Cigarettes    Quit date: 01/30/1970  . Smokeless tobacco: Never Used     Comment: smoked 1959-1972  . Alcohol use 2.4 oz/week    4 Standard drinks or equivalent per week     Comment: 11/42016 "nothing to drink since 07/25/2014"  . Drug use: No  . Sexual activity: Not Currently   Other Topics Concern  . Not on file   Social History Narrative   Pt lives in [place] with spouse. 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  Enjoys reading and walking.    Family History  Problem Relation Age of Onset  . Diabetes Father   . Hypertension Father   . Lung cancer Father     smoker  . Heart failure Mother 82  . Hyperlipidemia Sister   . Coronary artery disease Sister   . Heart attack Paternal Aunt     X 2; both > 65  . Colon cancer Neg Hx   . Stroke Neg Hx     Review of Systems  Constitutional: Negative for appetite change, chills, fatigue and fever.  HENT: Positive for hearing loss (minimal if any) and tinnitus.   Eyes: Negative for visual disturbance.  Respiratory: Negative for cough, shortness of breath and wheezing.   Cardiovascular: Negative for chest pain, palpitations and leg swelling.  Gastrointestinal: Negative for abdominal pain, constipation, diarrhea and nausea.       No gerd  Genitourinary: Negative for difficulty urinating, dysuria and hematuria.    Musculoskeletal: Positive for arthralgias (arthritis in fingers and toes). Negative for back pain and myalgias.  Skin: Negative for rash.  Neurological: Positive for numbness (occ tingling in fingers). Negative for dizziness, light-headedness and headaches.  Psychiatric/Behavioral: Negative for dysphoric mood. The patient is not nervous/anxious.        Objective:   Vitals:   09/27/15 0806  BP: 140/78  Pulse: 76  Resp: 18  Temp: 97.7 F (36.5 C)   Filed Weights   09/27/15 0806  Weight: 226 lb (102.5 kg)   Body mass index is 30.65 kg/m.   Physical Exam Constitutional: He appears well-developed and well-nourished. No distress.  HENT:  Head: Normocephalic and atraumatic.  Right Ear: External ear normal.  Left Ear: External ear normal.  Mouth/Throat: Oropharynx is clear and moist.  Normal ear canals and TM b/l  Eyes: Conjunctivae and EOM are normal.  Neck: Neck supple. No tracheal deviation present. No thyromegaly present.  No carotid bruit  Cardiovascular: Normal rate, regular rhythm, normal heart sounds and intact distal pulses.   2/6 systolic murmur heard. Pulmonary/Chest: Effort normal and breath sounds normal. No respiratory distress. He has no wheezes. He has no rales.  Abdominal: Soft. Bowel sounds are normal. He exhibits no distension. There is no tenderness.  Genitourinary: deferred to urology Musculoskeletal: He exhibits no edema.  Lymphadenopathy:    He has no cervical adenopathy.  Skin: Skin is warm and dry. He is not diaphoretic.  Psychiatric: He has a normal mood and affect. His behavior is normal.         Assessment & Plan:   Wellness Exam: Immunizations  Up to date except flu vaccine Colonoscopy   Up to date  Eye exam  Up to date  Hearing loss - no concerns Memory concerns/difficulties  None, except normal recall Independent of ADLs  - fully Stressed the importance of regular exercise  Patient received copy of preventative screening  tests/immunizations recommended for the next 5-10 years.   Physical exam: Screening blood work   ordered Immunizations  Up to date except flu vaccine Colonoscopy  Up to date  Eye exams  Up to date  EKG   Done 03/2015 by cardiology Exercise  - regular Weight - will work on weight loss Skin  - no abnormal moles, freckles Substance abuse - none  See Problem List for Assessment and Plan of chronic medical problems.

## 2015-09-26 NOTE — Patient Instructions (Addendum)
Bryce Maldonado , Thank you for taking time to come for your Medicare Wellness Visit. I appreciate your ongoing commitment to your health goals. Please review the following plan we discussed and let me know if I can assist you in the future.   These are the goals we discussed: Goals    None      This is a list of the screening recommended for you and due dates:  Health Maintenance  Topic Date Due  . Urine Protein Check  08/12/2014  . Eye exam for diabetics  06/15/2015  . Hemoglobin A1C  08/04/2015  . Flu Shot  08/31/2015  . Complete foot exam   09/26/2016  . Colon Cancer Screening  06/26/2022  . Tetanus Vaccine  08/12/2024  . Shingles Vaccine  Completed  . Pneumonia vaccines  Completed     Test(s) ordered today. Your results will be released to [place] (or called to you) after review, usually within 72hours after test completion. If any changes need to be made, you will be notified at that same time.  All other Health Maintenance issues reviewed.   All recommended immunizations and age-appropriate screenings are up-to-date or discussed.  Flu vaccine administered today.   Medications reviewed and updated.  No changes recommended at this time.   Please followup in 6 months   Health Maintenance, Male A healthy lifestyle and preventative care can promote health and wellness.  Maintain regular health, dental, and eye exams.  Eat a healthy diet. Foods like vegetables, fruits, whole grains, low-fat dairy products, and lean protein foods contain the nutrients you need and are low in calories. Decrease your intake of foods high in solid fats, added sugars, and salt. Get information about a proper diet from your health care provider, if necessary.  Regular physical exercise is one of the most important things you can do for your health. Most adults should get at least 150 minutes of moderate-intensity exercise (any activity that increases your heart rate and causes you to sweat) each  week. In addition, most adults need muscle-strengthening exercises on 2 or more days a week.   Maintain a healthy weight. The body mass index (BMI) is a screening tool to identify possible weight problems. It provides an estimate of body fat based on height and weight. Your health care provider can find your BMI and can help you achieve or maintain a healthy weight. For males 20 years and older:  A BMI below 18.5 is considered underweight.  A BMI of 18.5 to 24.9 is normal.  A BMI of 25 to 29.9 is considered overweight.  A BMI of 30 and above is considered obese.  Maintain normal blood lipids and cholesterol by exercising and minimizing your intake of saturated fat. Eat a balanced diet with plenty of fruits and vegetables. Blood tests for lipids and cholesterol should begin at age 41 and be repeated every 5 years. If your lipid or cholesterol levels are high, you are over age 48, or you are at high risk for heart disease, you may need your cholesterol levels checked more frequently.Ongoing high lipid and cholesterol levels should be treated with medicines if diet and exercise are not working.  If you smoke, find out from your health care provider how to quit. If you do not use tobacco, do not start.  Lung cancer screening is recommended for adults aged 51-80 years who are at high risk for developing lung cancer because of a history of smoking. A yearly low-dose CT scan of  the lungs is recommended for people who have at least a 30-pack-year history of smoking and are current smokers or have quit within the past 15 years. A pack year of smoking is smoking an average of 1 pack of cigarettes a day for 1 year (for example, a 30-pack-year history of smoking could mean smoking 1 pack a day for 30 years or 2 packs a day for 15 years). Yearly screening should continue until the smoker has stopped smoking for at least 15 years. Yearly screening should be stopped for people who develop a health problem that  would prevent them from having lung cancer treatment.  If you choose to drink alcohol, do not have more than 2 drinks per day. One drink is considered to be 12 oz (360 mL) of beer, 5 oz (150 mL) of wine, or 1.5 oz (45 mL) of liquor.  Avoid the use of street drugs. Do not share needles with anyone. Ask for help if you need support or instructions about stopping the use of drugs.  High blood pressure causes heart disease and increases the risk of stroke. High blood pressure is more likely to develop in:  People who have blood pressure in the end of the normal range (100-139/85-89 mm Hg).  People who are overweight or obese.  People who are African American.  If you are 44-42 years of age, have your blood pressure checked every 3-5 years. If you are 50 years of age or older, have your blood pressure checked every year. You should have your blood pressure measured twice--once when you are at a hospital or clinic, and once when you are not at a hospital or clinic. Record the average of the two measurements. To check your blood pressure when you are not at a hospital or clinic, you can use:  An automated blood pressure machine at a pharmacy.  A home blood pressure monitor.  If you are 48-109 years old, ask your health care provider if you should take aspirin to prevent heart disease.  Diabetes screening involves taking a blood sample to check your fasting blood sugar level. This should be done once every 3 years after age 73 if you are at a normal weight and without risk factors for diabetes. Testing should be considered at a younger age or be carried out more frequently if you are overweight and have at least 1 risk factor for diabetes.  Colorectal cancer can be detected and often prevented. Most routine colorectal cancer screening begins at the age of 35 and continues through age 5. However, your health care provider may recommend screening at an earlier age if you have risk factors for colon  cancer. On a yearly basis, your health care provider may provide home test kits to check for hidden blood in the stool. A small camera at the end of a tube may be used to directly examine the colon (sigmoidoscopy or colonoscopy) to detect the earliest forms of colorectal cancer. Talk to your health care provider about this at age 64 when routine screening begins. A direct exam of the colon should be repeated every 5-10 years through age 35, unless early forms of precancerous polyps or small growths are found.  People who are at an increased risk for hepatitis B should be screened for this virus. You are considered at high risk for hepatitis B if:  You were born in a country where hepatitis B occurs often. Talk with your health care provider about which countries are considered high  risk.  Your parents were born in a high-risk country and you have not received a shot to protect against hepatitis B (hepatitis B vaccine).  You have HIV or AIDS.  You use needles to inject street drugs.  You live with, or have sex with, someone who has hepatitis B.  You are a man who has sex with other men (MSM).  You get hemodialysis treatment.  You take certain medicines for conditions like cancer, organ transplantation, and autoimmune conditions.  Hepatitis C blood testing is recommended for all people born from [birth year] through 1964 and any individual with known risk factors for hepatitis C.  Healthy men should no longer receive prostate-specific antigen (PSA) blood tests as part of routine cancer screening. Talk to your health care provider about prostate cancer screening.  Testicular cancer screening is not recommended for adolescents or adult males who have no symptoms. Screening includes self-exam, a health care provider exam, and other screening tests. Consult with your health care provider about any symptoms you have or any concerns you have about testicular cancer.  Practice safe sex. Use condoms and  avoid high-risk sexual practices to reduce the spread of sexually transmitted infections (STIs).  You should be screened for STIs, including gonorrhea and chlamydia if:  You are sexually active and are younger than 24 years.  You are older than 24 years, and your health care provider tells you that you are at risk for this type of infection.  Your sexual activity has changed since you were last screened, and you are at an increased risk for chlamydia or gonorrhea. Ask your health care provider if you are at risk.  If you are at risk of being infected with HIV, it is recommended that you take a prescription medicine daily to prevent HIV infection. This is called pre-exposure prophylaxis (PrEP). You are considered at risk if:  You are a man who has sex with other men (MSM).  You are a heterosexual man who is sexually active with multiple partners.  You take drugs by injection.  You are sexually active with a partner who has HIV.  Talk with your health care provider about whether you are at high risk of being infected with HIV. If you choose to begin PrEP, you should first be tested for HIV. You should then be tested every 3 months for as long as you are taking PrEP.  Use sunscreen. Apply sunscreen liberally and repeatedly throughout the day. You should seek shade when your shadow is shorter than you. Protect yourself by wearing long sleeves, pants, a wide-brimmed hat, and sunglasses year round whenever you are outdoors.  Tell your health care provider of new moles or changes in moles, especially if there is a change in shape or color. Also, tell your health care provider if a mole is larger than the size of a pencil eraser.  A one-time screening for abdominal aortic aneurysm (AAA) and surgical repair of large AAAs by ultrasound is recommended for men aged 69-75 years who are current or former smokers.  Stay current with your vaccines (immunizations).   This information is not intended to  replace advice given to you by your health care provider. Make sure you discuss any questions you have with your health care provider.   Document Released: 07/15/2007 Document Revised: 02/06/2014 Document Reviewed: 06/13/2010 Elsevier Interactive Patient Education [employer].

## 2015-09-26 NOTE — Assessment & Plan Note (Addendum)
Lab Results  Component Value Date   HGBA1C 6.9 (H) 02/04/2015    A1c, urine micro albumin One low sugar at 91, otherwise well controlled Continue current medication

## 2015-09-26 NOTE — Assessment & Plan Note (Signed)
Check uric acid level Taking allopurinol 300 mg daily

## 2015-09-27 ENCOUNTER — Other Ambulatory Visit (INDEPENDENT_AMBULATORY_CARE_PROVIDER_SITE_OTHER): Payer: Medicare Other

## 2015-09-27 ENCOUNTER — Ambulatory Visit (INDEPENDENT_AMBULATORY_CARE_PROVIDER_SITE_OTHER): Payer: Medicare Other | Admitting: Internal Medicine

## 2015-09-27 ENCOUNTER — Encounter: Payer: Self-pay | Admitting: Internal Medicine

## 2015-09-27 VITALS — BP 140/78 | HR 76 | Temp 97.7°F | Resp 18 | Ht 72.0 in | Wt 226.0 lb

## 2015-09-27 DIAGNOSIS — E1365 Other specified diabetes mellitus with hyperglycemia: Secondary | ICD-10-CM | POA: Diagnosis not present

## 2015-09-27 DIAGNOSIS — Z8546 Personal history of malignant neoplasm of prostate: Secondary | ICD-10-CM

## 2015-09-27 DIAGNOSIS — Z0000 Encounter for general adult medical examination without abnormal findings: Secondary | ICD-10-CM | POA: Diagnosis not present

## 2015-09-27 DIAGNOSIS — Z8739 Personal history of other diseases of the musculoskeletal system and connective tissue: Secondary | ICD-10-CM

## 2015-09-27 DIAGNOSIS — IMO0002 Reserved for concepts with insufficient information to code with codable children: Secondary | ICD-10-CM

## 2015-09-27 DIAGNOSIS — E1351 Other specified diabetes mellitus with diabetic peripheral angiopathy without gangrene: Secondary | ICD-10-CM

## 2015-09-27 DIAGNOSIS — I25810 Atherosclerosis of coronary artery bypass graft(s) without angina pectoris: Secondary | ICD-10-CM

## 2015-09-27 DIAGNOSIS — Z23 Encounter for immunization: Secondary | ICD-10-CM | POA: Diagnosis not present

## 2015-09-27 DIAGNOSIS — Z8639 Personal history of other endocrine, nutritional and metabolic disease: Secondary | ICD-10-CM | POA: Diagnosis not present

## 2015-09-27 DIAGNOSIS — E782 Mixed hyperlipidemia: Secondary | ICD-10-CM

## 2015-09-27 LAB — COMPREHENSIVE METABOLIC PANEL
ALK PHOS: 57 U/L (ref 39–117)
ALT: 19 U/L (ref 0–53)
AST: 13 U/L (ref 0–37)
Albumin: 4.6 g/dL (ref 3.5–5.2)
BILIRUBIN TOTAL: 0.5 mg/dL (ref 0.2–1.2)
BUN: 18 mg/dL (ref 6–23)
CO2: 30 meq/L (ref 19–32)
CREATININE: 0.75 mg/dL (ref 0.40–1.50)
Calcium: 9.9 mg/dL (ref 8.4–10.5)
Chloride: 100 mEq/L (ref 96–112)
GFR: 108.21 mL/min (ref >60.00)
GLUCOSE: 194 mg/dL — AB (ref 70–99)
Potassium: 4.2 mEq/L (ref 3.5–5.1)
SODIUM: 138 meq/L (ref 135–145)
TOTAL PROTEIN: 7.8 g/dL (ref 6.0–8.3)

## 2015-09-27 LAB — LIPID PANEL
CHOL/HDL RATIO: 3
Cholesterol: 151 mg/dL (ref 0–200)
HDL: 50.3 mg/dL (ref >39.00)
LDL CALC: 75 mg/dL (ref 0–99)
NONHDL: 101.11
TRIGLYCERIDES: 131 mg/dL (ref 0.0–149.0)
VLDL: 26.2 mg/dL (ref 0.0–40.0)

## 2015-09-27 LAB — MICROALBUMIN / CREATININE URINE RATIO
CREATININE, U: 138.3 mg/dL
MICROALB UR: 0.9 mg/dL (ref 0.0–1.9)
Microalb Creat Ratio: 0.7 mg/g (ref 0.0–30.0)

## 2015-09-27 LAB — CBC WITH DIFFERENTIAL/PLATELET
BASOS PCT: 0.6 % (ref 0.0–3.0)
Basophils Absolute: 0 10*3/uL (ref 0.0–0.1)
EOS PCT: 3.4 % (ref 0.0–5.0)
Eosinophils Absolute: 0.3 10*3/uL (ref 0.0–0.7)
HCT: 45.1 % (ref 39.0–52.0)
HEMOGLOBIN: 15.5 g/dL (ref 13.0–17.0)
LYMPHS ABS: 1.8 10*3/uL (ref 0.7–4.0)
Lymphocytes Relative: 22.5 % (ref 12.0–46.0)
MCHC: 34.3 g/dL (ref 30.0–36.0)
MCV: 91.7 fl (ref 78.0–100.0)
MONO ABS: 0.8 10*3/uL (ref 0.1–1.0)
MONOS PCT: 10 % (ref 3.0–12.0)
Neutro Abs: 4.9 10*3/uL (ref 1.4–7.7)
Neutrophils Relative %: 63.5 % (ref 43.0–77.0)
Platelets: 292 10*3/uL (ref 150.0–400.0)
RBC: 4.92 Mil/uL (ref 4.22–5.81)
RDW: 13.1 % (ref 11.5–15.5)
WBC: 7.8 10*3/uL (ref 4.0–10.5)

## 2015-09-27 LAB — URIC ACID: Uric Acid, Serum: 4.6 mg/dL (ref 4.0–7.8)

## 2015-09-27 LAB — TSH: TSH: 1.09 u[IU]/mL (ref 0.35–4.50)

## 2015-09-27 LAB — HEMOGLOBIN A1C: Hgb A1c MFr Bld: 7.4 % — ABNORMAL HIGH (ref 4.6–6.5)

## 2015-09-27 NOTE — Assessment & Plan Note (Signed)
Check lipid panel Continue crestor and zetia

## 2015-09-27 NOTE — Assessment & Plan Note (Signed)
No chest pain,  Exercising daily Follows with cardiology Taking asa, statin

## 2015-09-27 NOTE — Progress Notes (Signed)
Pre visit review using our clinic review tool, if applicable. No additional management support is needed unless otherwise documented below in the visit note. 

## 2015-09-27 NOTE — Assessment & Plan Note (Signed)
Following with urology

## 2015-09-28 ENCOUNTER — Other Ambulatory Visit: Payer: Self-pay

## 2015-09-28 MED ORDER — EZETIMIBE 10 MG PO TABS: 10.0000 mg | ORAL_TABLET | Freq: Every day | ORAL | 2 refills | Status: DC

## 2015-09-28 MED ORDER — ROSUVASTATIN CALCIUM 10 MG PO TABS: 10.0000 mg | ORAL_TABLET | Freq: Every day | ORAL | 2 refills | Status: DC

## 2015-10-01 ENCOUNTER — Encounter: Payer: Self-pay | Admitting: Internal Medicine

## 2015-12-06 ENCOUNTER — Ambulatory Visit (INDEPENDENT_AMBULATORY_CARE_PROVIDER_SITE_OTHER): Payer: Medicare Other | Admitting: Internal Medicine

## 2015-12-06 ENCOUNTER — Encounter: Payer: Self-pay | Admitting: Internal Medicine

## 2015-12-06 DIAGNOSIS — R05 Cough: Secondary | ICD-10-CM | POA: Insufficient documentation

## 2015-12-06 DIAGNOSIS — R059 Cough, unspecified: Secondary | ICD-10-CM | POA: Insufficient documentation

## 2015-12-06 MED ORDER — HYDROCODONE-HOMATROPINE 5-1.5 MG/5ML PO SYRP: 5.0000 mL | ORAL_SOLUTION | Freq: Four times a day (QID) | ORAL | 0 refills | Status: DC | PRN

## 2015-12-06 MED ORDER — LEVOFLOXACIN 500 MG PO TABS: 500.0000 mg | ORAL_TABLET | Freq: Every day | ORAL | 0 refills | Status: AC

## 2015-12-06 NOTE — Patient Instructions (Signed)
Please take all new medication as prescribed - the antibiotic, and cough medicine if needed  Please continue all other medications as before, and refills have been done if requested.  Please have the pharmacy call with any other refills you may need.  Please keep your appointments with your specialists as you may have planned     

## 2015-12-06 NOTE — Progress Notes (Signed)
Pre visit review using our clinic review tool, if applicable. No additional management support is needed unless otherwise documented below in the visit note. 

## 2015-12-06 NOTE — Progress Notes (Signed)
Subjective:    Patient ID: Bryce Maldonado, male    DOB: [birth year], 75 y.o.   MRN: [id number]  HPI  Here with acute onset mild to mod 2-3 days ST, HA, general weakness and malaise, with prod cough greenish sputum, but Pt denies chest pain, increased sob or doe, wheezing, orthopnea, PND, increased LE swelling, palpitations, dizziness or syncope  Pt denies new neurological symptoms such as new headache, or facial or extremity weakness or numbness   Pt denies polydipsia, polyuria, Past Medical History:  Diagnosis Date  . Anemia   . BPH (benign prostatic hyperplasia)    Elevated PSA  . Childhood asthma   . Coronary artery disease    post CABG x4 -- in 1995 -- due to three-vessel coronary artery disease  . Diverticular disease   . Dyslipidemia   . First degree AV block   . Gout   . Osteoarthritis    "everywhere" (12/04/2014)  . Prostate cancer [facility]) 2012   Dr Karsten Ro  . Spinal stenosis 2004   L4 radiculopathy, herniated nucleus pulposus L4-5  . Tinnitus   . Type II diabetes mellitus ([employer])   . Typical atrial flutter [facility])    s/p ablation   Past Surgical History:  Procedure Laterality Date  . APPENDECTOMY  1943  . ATRIAL FLUTTER ABLATION  12/04/2014  . BACK SURGERY    . CARDIAC CATHETERIZATION  02/10/2010   Est. EF of 60-65% -- Severe three-vessel obstructive atherosclerotic coronary artery disease -- All grafts were patent including left internal mammary artery graft to left anterior descending coronary artery, saphenous vein graft to the diagonal, saphenous vein graft to obtuse marginal vessel, and saphenous vein graft to the posterior descending coronary artery -- Normal left ventricular function   . COLONOSCOPY     negative X3; Dr Carlean Purl  . CORONARY ARTERY BYPASS GRAFT  10/21/1993   x4 -- using the LIM artery graft to the LAD artery, with saphenous vein grafts to the diagonal branch of the LAD, OM branch the left circumflex coronary artery, and the posterior descending branch of  the RCA -- Est. EF of 65%-- Surgeon: Gaye Pollack, M.D.              . ELECTROPHYSIOLOGIC STUDY N/A 10/01/2014   CTI ablation by Dr Curt Bears  . ELECTROPHYSIOLOGIC STUDY N/A 12/04/2014   repeat CTI ablation by Dr Rayann Heman  . FLEXIBLE SIGMOIDOSCOPY  2005    external hemorrhoids  . JOINT REPLACEMENT    . MICRODISCECTOMY LUMBAR  2004   L4-5, Dr. Arnoldo Morale  . PROSTATE BIOPSY  10/2008   "had radiation tx for prostate cancer"  . SALIVARY STONE REMOVAL  2001  . TOTAL HIP ARTHROPLASTY Right November 2010  . TOTAL HIP ARTHROPLASTY Left 03/2010   Dr Alvan Dame    reports that he quit smoking about 45 years ago. His smoking use included Cigarettes. He has a 37.50 pack-year smoking history. He has never used smokeless tobacco. He reports that he drinks about 2.4 oz of alcohol per week . He reports that he does not use drugs. family history includes Coronary artery disease in his sister; Diabetes in his father; Heart attack in his paternal aunt; Heart failure (age of onset: 86) in his mother; Hyperlipidemia in his sister; Hypertension in his father; Lung cancer in his father. Allergies  Allergen Reactions  . Naproxen Sodium     rash   Current Outpatient Prescriptions on File Prior to Visit  Medication Sig Dispense Refill  . allopurinol (  ZYLOPRIM) 300 MG tablet Take 300 mg by mouth daily.    [name] aspirin EC 81 MG tablet Take 81 mg by mouth daily.    [name] ezetimibe (ZETIA) 10 MG tablet Take 1 tablet (10 mg total) by mouth at bedtime. 90 tablet 2  . fexofenadine (ALLEGRA) 180 MG tablet Take 180 mg by mouth every morning.     [name] glimepiride (AMARYL) 2 MG tablet TAKE 1 TABLET BY MOUTH DAILY BEFORE BREAKFAST 30 tablet 5  . glucose blood (ONETOUCH VERIO) test strip 1 each by Other route 2 (two) times daily. Use to check  Blood sugars twice a day Dx E11.9 100 each 3  . Melatonin 5 MG TABS Take 5 mg by mouth at bedtime.    . metFORMIN (GLUCOPHAGE) 500 MG tablet TAKE 1 TABLET BY MOUTH TWICE DAILY WITH A MEAL 60 tablet 6  .  Multiple Vitamins-Minerals (CENTRUM PO) Take 1 tablet by mouth daily.     Glory Rosebush DELICA LANCETS 99991111 MISC Use to help check blood sugars twice a day Dx E11.9 100 each 3  . rosuvastatin (CRESTOR) 10 MG tablet Take 1 tablet (10 mg total) by mouth at bedtime. 90 tablet 2  . venlafaxine XR (EFFEXOR-XR) 75 MG 24 hr capsule Take 1 capsule (75 mg total) by mouth daily. 90 capsule 2   No current facility-administered medications on file prior to visit.     Review of Systems  All otherwise neg per pt     Objective:   Physical Exam BP 136/74   Pulse 100   Temp 97.8 F (36.6 C) (Oral)   Resp 20   Wt 230 lb (104.3 kg)   SpO2 95%   BMI 31.19 kg/m  VS noted,  Constitutional: Pt appears in no apparent distress HENT: Head: NCAT.  Right Ear: External ear normal.  Left Ear: External ear normal.  Eyes: . Pupils are equal, round, and reactive to light. Conjunctivae and EOM are normal Neck: Normal range of motion. Neck supple.  Cardiovascular: Normal rate and regular rhythm.   Pulmonary/Chest: Effort normal and breath sounds decreased without rales or wheezing.  Neurological: Pt is alert. Not confused , motor grossly intact Skin: Skin is warm. No rash, no LE edema Psychiatric: Pt behavior is normal. No agitation.   CHEST  2 VIEW 02/02/2015 IMPRESSION: No active cardiopulmonary disease.  Suspected COPD.    Assessment & Plan:

## 2015-12-13 ENCOUNTER — Other Ambulatory Visit: Payer: Self-pay | Admitting: Internal Medicine

## 2015-12-13 NOTE — Assessment & Plan Note (Signed)
Mild to mod, c/w bronchitis vs pna, decliens cxr, for antibx course,  to f/u any worsening symptoms or concerns 

## 2015-12-16 ENCOUNTER — Ambulatory Visit: Payer: Medicare Other | Admitting: Cardiology

## 2015-12-17 NOTE — Progress Notes (Signed)
[facility]. [address], [address] [place], [place]  [address] Phone: [phone] Fax:  [phone]  Date:  12/20/2015   ID:  Bryce Maldonado, DOB Jul 26, 1940, MRN [id number]  PCP:  Bryce Rail, MD  Cardiologist:  Dr. Kymani Laursen Maldonado     History of Present Illness: Bryce Maldonado is a 75 y.o. male is seen for follow up CAD and atrial flutter.   He has a hx of CAD, s/p CABG in 1995, HL, prostate CA, DJD, spinal stenosis. Prior to left total hip replacement in 03/2010 he underwent a Lexiscan Myoview. This demonstrated an EF of 66% and inferior ischemia. LHC 01/2010: Proximal LAD 90%, proximal OM totally occluded, RCA occluded, SVG-PDA patent, SVG-diagonal patent, SVG-OM patent, LIMA-LAD patent. There were left to right collaterals. EF 60-65%. He was treated medically.   Last year he had new onset atrial flutter. He was anticoagulated and underwent Ablation by Dr. Rayann Maldonado on October 01, 2014. He had repeat ablation on Nov. 4 2016. No recurrent arrhythmia since then Dr. Rayann Maldonado stopped his anticoagulation.   On follow up today he feels great today. Walks daily. No chest pain, SOB, or palpitations. No problems.  Wt Readings from Last 3 Encounters:  12/20/15 226 lb 12.8 oz (102.9 kg)  12/06/15 230 lb (104.3 kg)  09/27/15 226 lb (102.5 kg)     Past Medical History:  Diagnosis Date  . Anemia   . BPH (benign prostatic hyperplasia)    Elevated PSA  . Childhood asthma   . Coronary artery disease    post CABG x4 -- in 1995 -- due to three-vessel coronary artery disease  . Diverticular disease   . Dyslipidemia   . First degree AV block   . Gout   . Osteoarthritis    "everywhere" (12/04/2014)  . Prostate cancer [facility]) 2012   Dr Bryce Maldonado  . Spinal stenosis 2004   L4 radiculopathy, herniated nucleus pulposus L4-5  . Tinnitus   . Type II diabetes mellitus ([place])   . Typical atrial flutter [facility])    s/p ablation    Current Outpatient Prescriptions  Medication Sig Dispense Refill  .  allopurinol (ZYLOPRIM) 300 MG tablet Take 300 mg by mouth daily.    [name] aspirin EC 81 MG tablet Take 81 mg by mouth daily.    [name] ezetimibe (ZETIA) 10 MG tablet Take 1 tablet (10 mg total) by mouth at bedtime. 90 tablet 2  . fexofenadine (ALLEGRA) 180 MG tablet Take 180 mg by mouth every morning.     [name] glimepiride (AMARYL) 2 MG tablet TAKE 1 TABLET BY MOUTH DAILY BEFORE BREAKFAST 30 tablet 2  . glucose blood (ONETOUCH VERIO) test strip 1 each by Other route 2 (two) times daily. Use to check  Blood sugars twice a day Dx E11.9 100 each 3  . Melatonin 5 MG TABS Take 5 mg by mouth at bedtime.    . metFORMIN (GLUCOPHAGE) 500 MG tablet TAKE 1 TABLET BY MOUTH TWICE DAILY WITH A MEAL 60 tablet 6  . Multiple Vitamins-Minerals (CENTRUM PO) Take 1 tablet by mouth daily.     Bryce Maldonado DELICA LANCETS 99991111 MISC Use to help check blood sugars twice a day Dx E11.9 100 each 3  . rosuvastatin (CRESTOR) 10 MG tablet Take 1 tablet (10 mg total) by mouth at bedtime. 90 tablet 2  . venlafaxine XR (EFFEXOR-XR) 75 MG 24 hr capsule Take 1 capsule (75 mg total) by mouth daily. 90 capsule 2   No current facility-administered  medications for this visit.     Allergies:    Allergies  Allergen Reactions  . Naproxen Sodium     rash    Social History:  The patient  reports that he quit smoking about 45 years ago. His smoking use included Cigarettes. He has a 37.50 pack-year smoking history. He has never used smokeless tobacco. He reports that he drinks about 2.4 oz of alcohol per week . He reports that he does not use drugs.   ROS:  Please see the history of present illness.      All other systems reviewed and negative.   PHYSICAL EXAM: VS:  BP (!) 160/76 (BP Location: Left Arm, Patient Position: Sitting, Cuff Size: Normal)   Pulse 100   Ht 6' (1.829 m)   Wt 226 lb 12.8 oz (102.9 kg)   BMI 30.76 kg/m  Well nourished, well developed, in no acute distress  HEENT: normal  Neck: no JVD Vascular:  No carotid bruits    Cardiac: IRRR,  normal S1, S2; RRR; no murmur  Lungs:  clear to auscultation bilaterally, no wheezing, rhonchi or rales  Abd: soft, nontender, no hepatomegaly  Ext: no edema  Skin: warm and dry  Neuro:  CNs 2-12 intact, no focal abnormalities noted  Laboratory data:  Lab Results  Component Value Date   WBC 7.8 09/27/2015   HGB 15.5 09/27/2015   HCT 45.1 09/27/2015   PLT 292.0 09/27/2015   GLUCOSE 194 (H) 09/27/2015   CHOL 151 09/27/2015   TRIG 131.0 09/27/2015   HDL 50.30 09/27/2015   LDLDIRECT 86.2 06/24/2010   LDLCALC 75 09/27/2015   ALT 19 09/27/2015   AST 13 09/27/2015   NA 138 09/27/2015   K 4.2 09/27/2015   CL 100 09/27/2015   CREATININE 0.75 09/27/2015   BUN 18 09/27/2015   CO2 30 09/27/2015   TSH 1.09 09/27/2015   PSA 6.12 (H) 08/06/2006   INR 0.97 03/04/2010   HGBA1C 7.4 (H) 09/27/2015   MICROALBUR 0.9 09/27/2015     ASSESSMENT AND PLAN:  1. CAD: s/p CABG 1995.  He is active without anginal symptoms.  He had a LHC in 2012 that demonstrated patent bypass grafts.  Continue medical Rx.  Continue  statin.   2. Atrial flutter- s/p successful ablation. Maintaining NSR. Will observe for recurrence of flutter or fib.   3.  Hyperlipidemia:  Well controlled on statin.  4.   DM per primary care.  I will follow up in 6 months.  Signed, Bryce Auvil [place] MD, [facility]

## 2015-12-20 ENCOUNTER — Ambulatory Visit (INDEPENDENT_AMBULATORY_CARE_PROVIDER_SITE_OTHER): Payer: Medicare Other | Admitting: Cardiology

## 2015-12-20 ENCOUNTER — Encounter: Payer: Self-pay | Admitting: Cardiology

## 2015-12-20 VITALS — BP 160/76 | HR 100 | Ht 72.0 in | Wt 226.8 lb

## 2015-12-20 DIAGNOSIS — E782 Mixed hyperlipidemia: Secondary | ICD-10-CM

## 2015-12-20 DIAGNOSIS — I483 Typical atrial flutter: Secondary | ICD-10-CM

## 2015-12-20 DIAGNOSIS — I25810 Atherosclerosis of coronary artery bypass graft(s) without angina pectoris: Secondary | ICD-10-CM | POA: Diagnosis not present

## 2015-12-20 NOTE — Patient Instructions (Addendum)
Continue your current therapy  I will see you in 6 months.   

## 2016-02-02 ENCOUNTER — Other Ambulatory Visit: Payer: Self-pay | Admitting: *Deleted

## 2016-02-02 MED ORDER — ALLOPURINOL 300 MG PO TABS: 300.0000 mg | ORAL_TABLET | Freq: Every day | ORAL | 1 refills | Status: DC

## 2016-02-24 ENCOUNTER — Other Ambulatory Visit: Payer: Self-pay | Admitting: Internal Medicine

## 2016-03-18 ENCOUNTER — Other Ambulatory Visit: Payer: Self-pay | Admitting: Internal Medicine

## 2016-03-25 ENCOUNTER — Other Ambulatory Visit: Payer: Self-pay | Admitting: Internal Medicine

## 2016-03-26 NOTE — Patient Instructions (Addendum)
  Test(s) ordered today. Your results will be released to [place] (or called to you) after review, usually within 72hours after test completion. If any changes need to be made, you will be notified at that same time.  All other Health Maintenance issues reviewed.   All recommended immunizations and age-appropriate screenings are up-to-date or discussed.  No immunizations administered today.   Medications reviewed and updated.  No changes recommended at this time.  Your prescription(s) have been submitted to your pharmacy. Please take as directed and contact our office if you believe you are having problem(s) with the medication(s).  A referral was ordered for dermatology.     Please followup in 6 months

## 2016-03-26 NOTE — Progress Notes (Signed)
Subjective:    Patient ID: Bryce Maldonado, male    DOB: [birth year], 76 y.o.   MRN: [id number]  HPI The patient is here for follow up.  Diabetes: He is taking his medication daily as prescribed. He is compliant with a diabetic diet. He is exercising regularly - walks daily. He monitors his sugars and they have been running 103 is the . He checks his feet daily and denies foot lesions. He is up-to-date with an ophthalmology examination.   Hyperlipidemia: He is taking his medication daily. He is compliant with a low fat/cholesterol diet. He is exercising regularly. He denies myalgias.   Anxiety:  He was put on effexor years ago for increased stress/anxiety related to multiple medical problems.  He denies anxiety now.  He wonders if he still needs it or not.    History of gout:  He is taking allopurinol daily as prescribed.  He denies any episodes of gout since being on allopurinol.    He thinks he should see a dermatologist for a skin check.   Medications and allergies reviewed with patient and updated if appropriate.  Patient Active Problem List   Diagnosis Date Noted  . Cough 12/06/2015  . SVT (supraventricular tachycardia) ([place]) 12/04/2014  . Typical atrial flutter ([place]) 11/03/2014  . PAF (paroxysmal atrial fibrillation) ([place]) 08/13/2014  . DM (diabetes mellitus), secondary, uncontrolled, with peripheral vascular complications ([place]) [id number]  . History of gout 03/04/2009  . ATOPIC RHINITIS 03/04/2009  . PROSTATE CANCER, HX OF 03/04/2009  . Mixed hyperlipidemia 08/06/2006  . Coronary atherosclerosis 08/06/2006    Current Outpatient Prescriptions on File Prior to Visit  Medication Sig Dispense Refill  . allopurinol (ZYLOPRIM) 300 MG tablet Take 1 tablet (300 mg total) by mouth daily. 90 tablet 1  . aspirin EC 81 MG tablet Take 81 mg by mouth daily.    [name] ezetimibe (ZETIA) 10 MG tablet Take 1 tablet (10 mg total) by mouth at bedtime. 90 tablet 2  . fexofenadine (ALLEGRA) 180  MG tablet Take 180 mg by mouth every morning.     [name] glimepiride (AMARYL) 2 MG tablet TAKE 1 TABLET BY MOUTH DAILY BEFORE BREAKFAST 30 tablet 0  . glucose blood (ONETOUCH VERIO) test strip 1 each by Other route 2 (two) times daily. Use to check  Blood sugars twice a day Dx E11.9 100 each 3  . Melatonin 5 MG TABS Take 5 mg by mouth at bedtime.    . metFORMIN (GLUCOPHAGE) 500 MG tablet TAKE 1 TABLET BY MOUTH TWICE DAILY WITH A MEAL 60 tablet 0  . Multiple Vitamins-Minerals (CENTRUM PO) Take 1 tablet by mouth daily.     Glory Rosebush DELICA LANCETS 99991111 MISC Use to help check blood sugars twice a day Dx E11.9 100 each 3  . rosuvastatin (CRESTOR) 10 MG tablet Take 1 tablet (10 mg total) by mouth at bedtime. 90 tablet 2  . venlafaxine XR (EFFEXOR-XR) 75 MG 24 hr capsule Take 1 capsule (75 mg total) by mouth daily. 90 capsule 2   No current facility-administered medications on file prior to visit.     Past Medical History:  Diagnosis Date  . Anemia   . BPH (benign prostatic hyperplasia)    Elevated PSA  . Childhood asthma   . Coronary artery disease    post CABG x4 -- in 1995 -- due to three-vessel coronary artery disease  . Diverticular disease   . Dyslipidemia   . First degree AV block   .  Gout   . Osteoarthritis    "everywhere" (12/04/2014)  . Prostate cancer [facility]) 2012   Dr Karsten Ro  . Spinal stenosis 2004   L4 radiculopathy, herniated nucleus pulposus L4-5  . Tinnitus   . Type II diabetes mellitus ([place])   . Typical atrial flutter [facility])    s/p ablation    Past Surgical History:  Procedure Laterality Date  . APPENDECTOMY  1943  . ATRIAL FLUTTER ABLATION  12/04/2014  . BACK SURGERY    . CARDIAC CATHETERIZATION  02/10/2010   Est. EF of 60-65% -- Severe three-vessel obstructive atherosclerotic coronary artery disease -- All grafts were patent including left internal mammary artery graft to left anterior descending coronary artery, saphenous vein graft to the diagonal, saphenous vein  graft to obtuse marginal vessel, and saphenous vein graft to the posterior descending coronary artery -- Normal left ventricular function   . COLONOSCOPY     negative X3; Dr Carlean Purl  . CORONARY ARTERY BYPASS GRAFT  10/21/1993   x4 -- using the LIM artery graft to the LAD artery, with saphenous vein grafts to the diagonal branch of the LAD, OM branch the left circumflex coronary artery, and the posterior descending branch of the RCA -- Est. EF of 65%-- Surgeon: Gaye Pollack, M.D.              . ELECTROPHYSIOLOGIC STUDY N/A 10/01/2014   CTI ablation by Dr Curt Bears  . ELECTROPHYSIOLOGIC STUDY N/A 12/04/2014   repeat CTI ablation by Dr Rayann Heman  . FLEXIBLE SIGMOIDOSCOPY  2005    external hemorrhoids  . JOINT REPLACEMENT    . MICRODISCECTOMY LUMBAR  2004   L4-5, Dr. Arnoldo Morale  . PROSTATE BIOPSY  10/2008   "had radiation tx for prostate cancer"  . SALIVARY STONE REMOVAL  2001  . TOTAL HIP ARTHROPLASTY Right November 2010  . TOTAL HIP ARTHROPLASTY Left 03/2010   Dr Alvan Dame    Social History   Social History  . Marital status: Married    Spouse name: N/A  . Number of children: N/A  . Years of education: N/A   Social History Main Topics  . Smoking status: Former Smoker    Packs/day: 2.50    Years: 15.00    Types: Cigarettes    Quit date: 01/30/1970  . Smokeless tobacco: Never Used     Comment: smoked 1959-1972  . Alcohol use 2.4 oz/week    4 Standard drinks or equivalent per week     Comment: 11/42016 "nothing to drink since 07/25/2014"  . Drug use: No  . Sexual activity: Not Currently   Other Topics Concern  . Not on file   Social History Narrative   Pt lives in [place] with spouse. 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  Enjoys reading and walking.    Family History  Problem Relation Age of Onset  . Diabetes Father   . Hypertension Father   . Lung cancer Father     smoker  . Heart failure Mother 28  . Hyperlipidemia Sister   . Coronary artery disease Sister   . Heart attack  Paternal Aunt     X 2; both > 65  . Colon cancer Neg Hx   . Stroke Neg Hx     Review of Systems  Constitutional: Negative for fever.  Respiratory: Negative for cough, shortness of breath and wheezing.   Cardiovascular: Negative for chest pain, palpitations and leg swelling.  Neurological: Positive for headaches (occ, sinus). Negative for dizziness, light-headedness and numbness.  Objective:   Vitals:   03/27/16 0923  BP: (!) 150/90  Pulse: 98  Resp: 16  Temp: 97.6 F (36.4 C)   Wt Readings from Last 3 Encounters:  03/27/16 225 lb (102.1 kg)  12/20/15 226 lb 12.8 oz (102.9 kg)  12/06/15 230 lb (104.3 kg)   Body mass index is 30.52 kg/m.   Physical Exam    Constitutional: Appears well-developed and well-nourished. No distress.  HENT:  Head: Normocephalic and atraumatic.  Neck: Neck supple. No tracheal deviation present. No thyromegaly present.  No cervical lymphadenopathy Cardiovascular: Normal rate, regular rhythm and normal heart sounds.   No murmur heard. No carotid bruit .  No edema Pulmonary/Chest: Effort normal and breath sounds normal. No respiratory distress. No has no wheezes. No rales.  Skin: Skin is warm and dry. Not diaphoretic.  Psychiatric: Normal mood and affect. Behavior is normal.      Assessment & Plan:    See Problem List for Assessment and Plan of chronic medical problems.   FU in 6 months

## 2016-03-27 ENCOUNTER — Other Ambulatory Visit (INDEPENDENT_AMBULATORY_CARE_PROVIDER_SITE_OTHER): Payer: Medicare Other

## 2016-03-27 ENCOUNTER — Encounter: Payer: Self-pay | Admitting: Internal Medicine

## 2016-03-27 ENCOUNTER — Ambulatory Visit (INDEPENDENT_AMBULATORY_CARE_PROVIDER_SITE_OTHER): Payer: Medicare Other | Admitting: Internal Medicine

## 2016-03-27 VITALS — BP 150/90 | HR 98 | Temp 97.6°F | Resp 16 | Ht 72.0 in | Wt 225.0 lb

## 2016-03-27 DIAGNOSIS — E1351 Other specified diabetes mellitus with diabetic peripheral angiopathy without gangrene: Secondary | ICD-10-CM

## 2016-03-27 DIAGNOSIS — I25810 Atherosclerosis of coronary artery bypass graft(s) without angina pectoris: Secondary | ICD-10-CM

## 2016-03-27 DIAGNOSIS — IMO0002 Reserved for concepts with insufficient information to code with codable children: Secondary | ICD-10-CM

## 2016-03-27 DIAGNOSIS — E1365 Other specified diabetes mellitus with hyperglycemia: Secondary | ICD-10-CM

## 2016-03-27 DIAGNOSIS — Z1283 Encounter for screening for malignant neoplasm of skin: Secondary | ICD-10-CM | POA: Diagnosis not present

## 2016-03-27 DIAGNOSIS — Z8739 Personal history of other diseases of the musculoskeletal system and connective tissue: Secondary | ICD-10-CM

## 2016-03-27 DIAGNOSIS — E782 Mixed hyperlipidemia: Secondary | ICD-10-CM | POA: Diagnosis not present

## 2016-03-27 DIAGNOSIS — F419 Anxiety disorder, unspecified: Secondary | ICD-10-CM

## 2016-03-27 DIAGNOSIS — Z8546 Personal history of malignant neoplasm of prostate: Secondary | ICD-10-CM

## 2016-03-27 LAB — COMPREHENSIVE METABOLIC PANEL
ALBUMIN: 4.7 g/dL (ref 3.5–5.2)
ALK PHOS: 59 U/L (ref 39–117)
ALT: 22 U/L (ref 0–53)
AST: 15 U/L (ref 0–37)
BUN: 20 mg/dL (ref 6–23)
CALCIUM: 10.2 mg/dL (ref 8.4–10.5)
CHLORIDE: 99 meq/L (ref 96–112)
CO2: 27 mEq/L (ref 19–32)
CREATININE: 0.86 mg/dL (ref 0.40–1.50)
GFR: 92.28 mL/min (ref >60.00)
Glucose, Bld: 219 mg/dL — ABNORMAL HIGH (ref 70–99)
POTASSIUM: 4.4 meq/L (ref 3.5–5.1)
Sodium: 137 mEq/L (ref 135–145)
TOTAL PROTEIN: 7.7 g/dL (ref 6.0–8.3)
Total Bilirubin: 0.6 mg/dL (ref 0.2–1.2)

## 2016-03-27 LAB — HEMOGLOBIN A1C: Hgb A1c MFr Bld: 7.8 % — ABNORMAL HIGH (ref 4.6–6.5)

## 2016-03-27 LAB — LIPID PANEL
CHOLESTEROL: 163 mg/dL (ref 0–200)
HDL: 49.9 mg/dL (ref >39.00)
LDL CALC: 78 mg/dL (ref 0–99)
NonHDL: 112.64
TRIGLYCERIDES: 171 mg/dL — AB (ref 0.0–149.0)
Total CHOL/HDL Ratio: 3
VLDL: 34.2 mg/dL (ref 0.0–40.0)

## 2016-03-27 MED ORDER — METFORMIN HCL 500 MG PO TABS: ORAL_TABLET | ORAL | 3 refills | Status: DC

## 2016-03-27 MED ORDER — GLIMEPIRIDE 2 MG PO TABS: ORAL_TABLET | ORAL | 3 refills | Status: DC

## 2016-03-27 NOTE — Assessment & Plan Note (Signed)
Check lipid panel  Continue daily statin Regular exercise and healthy diet encouraged  

## 2016-03-27 NOTE — Assessment & Plan Note (Signed)
No chest pain, sob, palpitations Following with cardiology Continue current meds - ASA, Statin BP well controlled at home

## 2016-03-27 NOTE — Assessment & Plan Note (Addendum)
Denies anxiety on medication - ? Still needs it Discussed that he can try decreasing dose to 37.5 mg daily for a month to see how he feels  - he will think about it Continue current dose for now

## 2016-03-27 NOTE — Assessment & Plan Note (Signed)
Check a1c Low sugar / carb diet Stressed regular exercise, weight loss Discussed possibly increasing metformin and d/c'ing amaryl - will see how a1c is

## 2016-03-27 NOTE — Progress Notes (Signed)
Pre visit review using our clinic review tool, if applicable. No additional management support is needed unless otherwise documented below in the visit note. 

## 2016-03-27 NOTE — Assessment & Plan Note (Signed)
No recurrence on allopurinol at current dose Continue current dose of allopurinol

## 2016-03-27 NOTE — Assessment & Plan Note (Signed)
Following with urology - up to date with visits

## 2016-03-28 ENCOUNTER — Other Ambulatory Visit: Payer: Self-pay | Admitting: Internal Medicine

## 2016-03-28 ENCOUNTER — Other Ambulatory Visit: Payer: Self-pay | Admitting: Cardiology

## 2016-04-02 ENCOUNTER — Encounter: Payer: Self-pay | Admitting: Internal Medicine

## 2016-04-20 ENCOUNTER — Other Ambulatory Visit: Payer: Self-pay | Admitting: Internal Medicine

## 2016-04-25 ENCOUNTER — Encounter: Payer: Self-pay | Admitting: Internal Medicine

## 2016-04-25 NOTE — Progress Notes (Unsigned)
Result Abstracted 

## 2016-05-15 ENCOUNTER — Encounter: Payer: Self-pay | Admitting: Internal Medicine

## 2016-06-17 NOTE — Progress Notes (Signed)
Bryce Maldonado. [address] [place], [place]  [address] Phone: [phone] Fax:  [phone]  Date:  06/19/2016   ID:  Bryce Maldonado, DOB 12-08-1940, MRN [id number]  PCP:  Binnie Rail, MD  Cardiologist:  Dr. Peter Martinique     History of Present Illness: Bryce Maldonado is a 76 y.o. male is seen for follow up CAD and atrial flutter.   He has a hx of CAD, s/p CABG in 1995, HL, prostate CA, DJD, spinal stenosis. Prior to left total hip replacement in 03/2010 he underwent a Lexiscan Myoview. This demonstrated an EF of 66% and inferior ischemia. LHC 01/2010: Proximal LAD 90%, proximal OM totally occluded, RCA occluded, SVG-PDA patent, SVG-diagonal patent, SVG-OM patent, LIMA-LAD patent. There were left to right collaterals. EF 60-65%. He was treated medically.   In 2016 he had new onset atrial flutter. He was anticoagulated and underwent Ablation by Dr. Rayann Heman on October 01, 2014. He had repeat ablation on Nov. 4 2016. No recurrent arrhythmia since.  On follow up today he feels fine. Admits he doesn't exercise a lot. Does walk on treadmill a little.  No chest pain, SOB, or palpitations. States energy level is OK but could be better. No dizziness.  Wt Readings from Last 3 Encounters:  06/19/16 227 lb 3.2 oz (103.1 kg)  03/27/16 225 lb (102.1 kg)  12/20/15 226 lb 12.8 oz (102.9 kg)     Past Medical History:  Diagnosis Date  . Anemia   . BPH (benign prostatic hyperplasia)    Elevated PSA  . Childhood asthma   . Coronary artery disease    post CABG x4 -- in 1995 -- due to three-vessel coronary artery disease  . Diverticular disease   . Dyslipidemia   . First degree AV block   . Gout   . Osteoarthritis    "everywhere" (12/04/2014)  . Prostate cancer [facility]) 2012   Dr Karsten Ro  . Spinal stenosis 2004   L4 radiculopathy, herniated nucleus pulposus L4-5  . Tinnitus   . Type II diabetes mellitus ([place])   . Typical atrial flutter [facility])    s/p ablation    Current Outpatient  Prescriptions  Medication Sig Dispense Refill  . allopurinol (ZYLOPRIM) 300 MG tablet Take 1 tablet (300 mg total) by mouth daily. 90 tablet 1  . aspirin EC 81 MG tablet Take 81 mg by mouth daily.    [name] ezetimibe (ZETIA) 10 MG tablet TAKE 1 TABLET BY MOUTH AT  BEDTIME 90 tablet 0  . fexofenadine (ALLEGRA) 180 MG tablet Take 180 mg by mouth every morning.     [name] glimepiride (AMARYL) 2 MG tablet TAKE 1 TABLET BY MOUTH DAILY BEFORE BREAKFAST 30 tablet 5  . glucose blood (ONETOUCH VERIO) test strip 1 each by Other route 2 (two) times daily. Use to check  Blood sugars twice a day Dx E11.9 100 each 3  . Melatonin 5 MG TABS Take 5 mg by mouth at bedtime.    . metFORMIN (GLUCOPHAGE) 500 MG tablet TAKE 1 TABLET BY MOUTH TWICE DAILY WITH A MEAL 180 tablet 3  . Multiple Vitamins-Minerals (CENTRUM PO) Take 1 tablet by mouth daily.     Glory Rosebush DELICA LANCETS 81O MISC Use to help check blood sugars twice a day Dx E11.9 100 each 3  . rosuvastatin (CRESTOR) 10 MG tablet TAKE 1 TABLET BY MOUTH AT  BEDTIME 90 tablet 0  . venlafaxine XR (EFFEXOR-XR) 75 MG 24 hr capsule TAKE 1 CAPSULE  BY MOUTH  DAILY 90 capsule 3   No current facility-administered medications for this visit.     Allergies:    Allergies  Allergen Reactions  . Naproxen Sodium     rash    Social History:  The patient  reports that he quit smoking about 46 years ago. His smoking use included Cigarettes. He has a 37.50 pack-year smoking history. He has never used smokeless tobacco. He reports that he drinks about 2.4 oz of alcohol per week . He reports that he does not use drugs.   ROS:  Please see the history of present illness.      All other systems reviewed and negative.   PHYSICAL EXAM: VS:  BP (!) 152/80 (BP Location: Right Arm, Patient Position: Sitting, Cuff Size: Large)   Pulse 87   Ht 6' (1.829 m)   Wt 227 lb 3.2 oz (103.1 kg)   BMI 30.81 kg/m  Well nourished, well developed, in no acute distress  HEENT: normal  Neck: no  JVD Vascular:  No carotid bruits  Cardiac: IRRR,  normal S1, S2; RRR; no murmur  Lungs:  clear to auscultation bilaterally, no wheezing, rhonchi or rales  Abd: soft, nontender, no hepatomegaly  Ext: no edema  Skin: warm and dry  Neuro:  CNs 2-12 intact, no focal abnormalities noted  Laboratory data:  Lab Results  Component Value Date   WBC 7.8 09/27/2015   HGB 15.5 09/27/2015   HCT 45.1 09/27/2015   PLT 292.0 09/27/2015   GLUCOSE 219 (H) 03/27/2016   CHOL 163 03/27/2016   TRIG 171.0 (H) 03/27/2016   HDL 49.90 03/27/2016   LDLDIRECT 86.2 06/24/2010   LDLCALC 78 03/27/2016   ALT 22 03/27/2016   AST 15 03/27/2016   NA 137 03/27/2016   K 4.4 03/27/2016   CL 99 03/27/2016   CREATININE 0.86 03/27/2016   BUN 20 03/27/2016   CO2 27 03/27/2016   TSH 1.09 09/27/2015   PSA 6.12 (H) 08/06/2006   INR 0.97 03/04/2010   HGBA1C 7.8 (H) 03/27/2016   MICROALBUR 0.9 09/27/2015   Ecg today shows NSR with long first degree AV block. PR interval 334 msec. Nonspecific TWA. I have personally reviewed and interpreted this study.   ASSESSMENT AND PLAN:  1. CAD: s/p CABG 1995.  He is active without anginal symptoms.  He had a LHC in 2012 that demonstrated patent bypass grafts.  Continue medical Rx.  Continue  statin.   2. Atrial flutter- s/p successful ablation. Maintaining NSR. Will observe for recurrence of flutter or fib.   3.  Hyperlipidemia:  Well controlled on statin.  4.   DM per primary care. Note that glycemic control has not been optimal. May want to consider a SGLT 2 inhibitor given reduction in CV risk. Will defer to Dr. Quay Burow.   5. First degree AV block. PR interval increased from 216>> 334 msec. No symptoms. Will monitor.  I will follow up in 6 months.  Signed, Peter Martinique MD, [facility]

## 2016-06-19 ENCOUNTER — Ambulatory Visit (INDEPENDENT_AMBULATORY_CARE_PROVIDER_SITE_OTHER): Payer: Medicare Other | Admitting: Cardiology

## 2016-06-19 ENCOUNTER — Encounter: Payer: Self-pay | Admitting: Cardiology

## 2016-06-19 VITALS — BP 152/80 | HR 87 | Ht 72.0 in | Wt 227.2 lb

## 2016-06-19 DIAGNOSIS — E1351 Other specified diabetes mellitus with diabetic peripheral angiopathy without gangrene: Secondary | ICD-10-CM

## 2016-06-19 DIAGNOSIS — I10 Essential (primary) hypertension: Secondary | ICD-10-CM

## 2016-06-19 DIAGNOSIS — I25810 Atherosclerosis of coronary artery bypass graft(s) without angina pectoris: Secondary | ICD-10-CM | POA: Diagnosis not present

## 2016-06-19 DIAGNOSIS — IMO0002 Reserved for concepts with insufficient information to code with codable children: Secondary | ICD-10-CM

## 2016-06-19 DIAGNOSIS — E1365 Other specified diabetes mellitus with hyperglycemia: Secondary | ICD-10-CM | POA: Diagnosis not present

## 2016-06-19 DIAGNOSIS — E782 Mixed hyperlipidemia: Secondary | ICD-10-CM

## 2016-06-19 NOTE — Patient Instructions (Signed)
Continue your current therapy  Get more active!!  I will see you in 6 months.

## 2016-06-30 ENCOUNTER — Other Ambulatory Visit: Payer: Self-pay | Admitting: Emergency Medicine

## 2016-06-30 MED ORDER — VENLAFAXINE HCL ER 75 MG PO CP24: 75.0000 mg | ORAL_CAPSULE | Freq: Every day | ORAL | 1 refills | Status: DC

## 2016-06-30 MED ORDER — VENLAFAXINE HCL ER 75 MG PO CP24
75.0000 mg | ORAL_CAPSULE | Freq: Every day | ORAL | 1 refills | Status: DC
Start: 2016-06-30 — End: 2016-12-26

## 2016-07-03 ENCOUNTER — Other Ambulatory Visit: Payer: Self-pay | Admitting: *Deleted

## 2016-07-03 MED ORDER — ROSUVASTATIN CALCIUM 10 MG PO TABS: 10.0000 mg | ORAL_TABLET | Freq: Every day | ORAL | 2 refills | Status: DC

## 2016-07-03 MED ORDER — EZETIMIBE 10 MG PO TABS: 10.0000 mg | ORAL_TABLET | Freq: Every day | ORAL | 2 refills | Status: DC

## 2016-08-07 LAB — HM DIABETES EYE EXAM

## 2016-08-10 ENCOUNTER — Encounter: Payer: Self-pay | Admitting: Internal Medicine

## 2016-08-10 NOTE — Progress Notes (Signed)
Abstracted and sent to scan  

## 2016-08-18 ENCOUNTER — Telehealth: Payer: Self-pay | Admitting: Internal Medicine

## 2016-08-18 NOTE — Telephone Encounter (Signed)
Requesting last last BP test and kidney function panel.

## 2016-08-22 NOTE — Telephone Encounter (Signed)
Tried returning call and this was not a direct line and do not have a fax number to send information to.

## 2016-09-27 ENCOUNTER — Other Ambulatory Visit (INDEPENDENT_AMBULATORY_CARE_PROVIDER_SITE_OTHER): Payer: Medicare Other

## 2016-09-27 ENCOUNTER — Encounter: Payer: Self-pay | Admitting: Internal Medicine

## 2016-09-27 ENCOUNTER — Ambulatory Visit (INDEPENDENT_AMBULATORY_CARE_PROVIDER_SITE_OTHER): Payer: Medicare Other | Admitting: Internal Medicine

## 2016-09-27 VITALS — BP 144/84 | HR 87 | Temp 98.6°F | Resp 16 | Wt 225.0 lb

## 2016-09-27 DIAGNOSIS — I25810 Atherosclerosis of coronary artery bypass graft(s) without angina pectoris: Secondary | ICD-10-CM

## 2016-09-27 DIAGNOSIS — E1351 Other specified diabetes mellitus with diabetic peripheral angiopathy without gangrene: Secondary | ICD-10-CM

## 2016-09-27 DIAGNOSIS — E1365 Other specified diabetes mellitus with hyperglycemia: Secondary | ICD-10-CM | POA: Diagnosis not present

## 2016-09-27 DIAGNOSIS — E782 Mixed hyperlipidemia: Secondary | ICD-10-CM

## 2016-09-27 DIAGNOSIS — F419 Anxiety disorder, unspecified: Secondary | ICD-10-CM

## 2016-09-27 DIAGNOSIS — IMO0002 Reserved for concepts with insufficient information to code with codable children: Secondary | ICD-10-CM

## 2016-09-27 DIAGNOSIS — Z8739 Personal history of other diseases of the musculoskeletal system and connective tissue: Secondary | ICD-10-CM | POA: Diagnosis not present

## 2016-09-27 DIAGNOSIS — Z0000 Encounter for general adult medical examination without abnormal findings: Secondary | ICD-10-CM | POA: Diagnosis not present

## 2016-09-27 DIAGNOSIS — I10 Essential (primary) hypertension: Secondary | ICD-10-CM | POA: Diagnosis not present

## 2016-09-27 LAB — COMPREHENSIVE METABOLIC PANEL
ALT: 18 U/L (ref 0–53)
AST: 13 U/L (ref 0–37)
Albumin: 4.8 g/dL (ref 3.5–5.2)
Alkaline Phosphatase: 46 U/L (ref 39–117)
BILIRUBIN TOTAL: 0.5 mg/dL (ref 0.2–1.2)
BUN: 16 mg/dL (ref 6–23)
CHLORIDE: 100 meq/L (ref 96–112)
CO2: 31 meq/L (ref 19–32)
CREATININE: 0.76 mg/dL (ref 0.40–1.50)
Calcium: 10.4 mg/dL (ref 8.4–10.5)
GFR: 106.28 mL/min (ref >60.00)
GLUCOSE: 203 mg/dL — AB (ref 70–99)
Potassium: 4.6 mEq/L (ref 3.5–5.1)
Sodium: 138 mEq/L (ref 135–145)
Total Protein: 7.9 g/dL (ref 6.0–8.3)

## 2016-09-27 LAB — CBC WITH DIFFERENTIAL/PLATELET
BASOS ABS: 0.1 10*3/uL (ref 0.0–0.1)
Basophils Relative: 2 % (ref 0.0–3.0)
EOS PCT: 3.7 % (ref 0.0–5.0)
Eosinophils Absolute: 0.2 10*3/uL (ref 0.0–0.7)
HCT: 46.1 % (ref 39.0–52.0)
HEMOGLOBIN: 15.3 g/dL (ref 13.0–17.0)
Lymphocytes Relative: 28.7 % (ref 12.0–46.0)
Lymphs Abs: 1.8 10*3/uL (ref 0.7–4.0)
MCHC: 33.3 g/dL (ref 30.0–36.0)
MCV: 94.2 fl (ref 78.0–100.0)
MONO ABS: 0.7 10*3/uL (ref 0.1–1.0)
MONOS PCT: 10.9 % (ref 3.0–12.0)
NEUTROS PCT: 54.7 % (ref 43.0–77.0)
Neutro Abs: 3.4 10*3/uL (ref 1.4–7.7)
Platelets: 315 10*3/uL (ref 150.0–400.0)
RBC: 4.89 Mil/uL (ref 4.22–5.81)
RDW: 13.5 % (ref 11.5–15.5)
WBC: 6.2 10*3/uL (ref 4.0–10.5)

## 2016-09-27 LAB — MICROALBUMIN / CREATININE URINE RATIO
CREATININE, U: 109.4 mg/dL
MICROALB UR: 1.1 mg/dL (ref 0.0–1.9)
MICROALB/CREAT RATIO: 1 mg/g (ref 0.0–30.0)

## 2016-09-27 LAB — HEMOGLOBIN A1C: HEMOGLOBIN A1C: 7.9 % — AB (ref 4.6–6.5)

## 2016-09-27 LAB — LIPID PANEL
Cholesterol: 132 mg/dL (ref 0–200)
HDL: 44.9 mg/dL (ref >39.00)
LDL Cholesterol: 66 mg/dL (ref 0–99)
NONHDL: 87
Total CHOL/HDL Ratio: 3
Triglycerides: 107 mg/dL (ref 0.0–149.0)
VLDL: 21.4 mg/dL (ref 0.0–40.0)

## 2016-09-27 LAB — TSH: TSH: 0.87 u[IU]/mL (ref 0.35–4.50)

## 2016-09-27 MED ORDER — LOSARTAN POTASSIUM 25 MG PO TABS: 25.0000 mg | ORAL_TABLET | Freq: Every day | ORAL | 1 refills | Status: DC

## 2016-09-27 NOTE — Patient Instructions (Addendum)
Test(s) ordered today. Your results will be released to [facility] (or called to you) after review, usually within 72hours after test completion. If any changes need to be made, you will be notified at that same time.  All other Health Maintenance issues reviewed.   All recommended immunizations and age-appropriate screenings are up-to-date or discussed.  No immunizations administered today.   Medications reviewed and updated.  Changes include starting losartan 25 mg daily for your blood pressure.   Your prescription(s) have been submitted to your pharmacy. Please take as directed and contact our office if you believe you are having problem(s) with the medication(s).   Please followup in 6 months    Health Maintenance, Male A healthy lifestyle and preventive care is important for your health and wellness. Ask your health care provider about what schedule of regular examinations is right for you. What should I know about weight and diet? Eat a Healthy Diet  Eat plenty of vegetables, fruits, whole grains, low-fat dairy products, and lean protein.  Do not eat a lot of foods high in solid fats, added sugars, or salt.  Maintain a Healthy Weight Regular exercise can help you achieve or maintain a healthy weight. You should:  Do at least 150 minutes of exercise each week. The exercise should increase your heart rate and make you sweat (moderate-intensity exercise).  Do strength-training exercises at least twice a week.  Watch Your Levels of Cholesterol and Blood Lipids  Have your blood tested for lipids and cholesterol every 5 years starting at 75 years of age. If you are at high risk for heart disease, you should start having your blood tested when you are 75 years old. You may need to have your cholesterol levels checked more often if: ? Your lipid or cholesterol levels are high. ? You are older than 75 years of age. ? You are at high risk for heart disease.  What should I know about  cancer screening? Many types of cancers can be detected early and may often be prevented. Lung Cancer  You should be screened every year for lung cancer if: ? You are a current smoker who has smoked for at least 30 years. ? You are a former smoker who has quit within the past 15 years.  Talk to your health care provider about your screening options, when you should start screening, and how often you should be screened.  Colorectal Cancer  Routine colorectal cancer screening usually begins at 75 years of age and should be repeated every 5-10 years until you are 75 years old. You may need to be screened more often if early forms of precancerous polyps or small growths are found. Your health care provider may recommend screening at an earlier age if you have risk factors for colon cancer.  Your health care provider may recommend using home test kits to check for hidden blood in the stool.  A small camera at the end of a tube can be used to examine your colon (sigmoidoscopy or colonoscopy). This checks for the earliest forms of colorectal cancer.  Prostate and Testicular Cancer  Depending on your age and overall health, your health care provider may do certain tests to screen for prostate and testicular cancer.  Talk to your health care provider about any symptoms or concerns you have about testicular or prostate cancer.  Skin Cancer  Check your skin from head to toe regularly.  Tell your health care provider about any new moles or changes in moles,  especially if: ? There is a change in a mole's size, shape, or color. ? You have a mole that is larger than a pencil eraser.  Always use sunscreen. Apply sunscreen liberally and repeat throughout the day.  Protect yourself by wearing long sleeves, pants, a wide-brimmed hat, and sunglasses when outside.  What should I know about heart disease, diabetes, and high blood pressure?  If you are 11-13 years of age, have your blood pressure  checked every 3-5 years. If you are 16 years of age or older, have your blood pressure checked every year. You should have your blood pressure measured twice-once when you are at a hospital or clinic, and once when you are not at a hospital or clinic. Record the average of the two measurements. To check your blood pressure when you are not at a hospital or clinic, you can use: ? An automated blood pressure machine at a pharmacy. ? A home blood pressure monitor.  Talk to your health care provider about your target blood pressure.  If you are between 7-52 years old, ask your health care provider if you should take aspirin to prevent heart disease.  Have regular diabetes screenings by checking your fasting blood sugar level. ? If you are at a normal weight and have a low risk for diabetes, have this test once every three years after the age of 41. ? If you are overweight and have a high risk for diabetes, consider being tested at a younger age or more often.  A one-time screening for abdominal aortic aneurysm (AAA) by ultrasound is recommended for men aged 2-75 years who are current or former smokers. What should I know about preventing infection? Hepatitis B If you have a higher risk for hepatitis B, you should be screened for this virus. Talk with your health care provider to find out if you are at risk for hepatitis B infection. Hepatitis C Blood testing is recommended for:  Everyone born from [birth year] through 1964.  Anyone with known risk factors for hepatitis C.  Sexually Transmitted Diseases (STDs)  You should be screened each year for STDs including gonorrhea and chlamydia if: ? You are sexually active and are younger than 75 years of age. ? You are older than 75 years of age and your health care provider tells you that you are at risk for this type of infection. ? Your sexual activity has changed since you were last screened and you are at an increased risk for chlamydia or gonorrhea.  Ask your health care provider if you are at risk.  Talk with your health care provider about whether you are at high risk of being infected with HIV. Your health care provider may recommend a prescription medicine to help prevent HIV infection.  What else can I do?  Schedule regular health, dental, and eye exams.  Stay current with your vaccines (immunizations).  Do not use any tobacco products, such as cigarettes, chewing tobacco, and e-cigarettes. If you need help quitting, ask your health care provider.  Limit alcohol intake to no more than 2 drinks per day. One drink equals 12 ounces of beer, 5 ounces of wine, or 1 ounces of hard liquor.  Do not use street drugs.  Do not share needles.  Ask your health care provider for help if you need support or information about quitting drugs.  Tell your health care provider if you often feel depressed.  Tell your health care provider if you have ever been abused or  do not feel safe at home. This information is not intended to replace advice given to you by your health care provider. Make sure you discuss any questions you have with your health care provider. Document Released: 07/15/2007 Document Revised: 09/15/2015 Document Reviewed: 10/20/2014 Elsevier Interactive Patient Education  [employer].

## 2016-09-27 NOTE — Progress Notes (Signed)
Subjective:    Patient ID: Bryce Maldonado, male    DOB: Apr 16, 1940, 76 y.o.   MRN: [id number]  HPI He is here for a physical exam.   He is not always compliant with a diabetic diet, but has been better over the past month.  He is doing a lot of walking with ADL's, but no exercise.   His scalp itches when he sweats.  He has no other complaints.     Medications and allergies reviewed with patient and updated if appropriate.  Patient Active Problem List   Diagnosis Date Noted  . Anxiety 03/27/2016  . SVT (supraventricular tachycardia) ([place]) 12/04/2014  . Typical atrial flutter ([place]) 11/03/2014  . PAF (paroxysmal atrial fibrillation) ([place]) 08/13/2014  . DM (diabetes mellitus), secondary, uncontrolled, with peripheral vascular complications ([place]) 60/73/7106  . History of gout 03/04/2009  . ATOPIC RHINITIS 03/04/2009  . PROSTATE CANCER, HX OF 03/04/2009  . Mixed hyperlipidemia 08/06/2006  . Coronary atherosclerosis 08/06/2006    Current Outpatient Prescriptions on File Prior to Visit  Medication Sig Dispense Refill  . allopurinol (ZYLOPRIM) 300 MG tablet Take 1 tablet (300 mg total) by mouth daily. 90 tablet 1  . aspirin EC 81 MG tablet Take 81 mg by mouth daily.    [name] ezetimibe (ZETIA) 10 MG tablet Take 1 tablet (10 mg total) by mouth at bedtime. 90 tablet 2  . fexofenadine (ALLEGRA) 180 MG tablet Take 180 mg by mouth every morning.     [name] glimepiride (AMARYL) 2 MG tablet TAKE 1 TABLET BY MOUTH DAILY BEFORE BREAKFAST 30 tablet 5  . glucose blood (ONETOUCH VERIO) test strip 1 each by Other route 2 (two) times daily. Use to check  Blood sugars twice a day Dx E11.9 100 each 3  . Melatonin 5 MG TABS Take 5 mg by mouth at bedtime.    . metFORMIN (GLUCOPHAGE) 500 MG tablet TAKE 1 TABLET BY MOUTH TWICE DAILY WITH A MEAL 180 tablet 3  . Multiple Vitamins-Minerals (CENTRUM PO) Take 1 tablet by mouth daily.     Glory Rosebush DELICA LANCETS 26R MISC Use to help check blood sugars twice a  day Dx E11.9 100 each 3  . rosuvastatin (CRESTOR) 10 MG tablet Take 1 tablet (10 mg total) by mouth at bedtime. 90 tablet 2  . venlafaxine XR (EFFEXOR-XR) 75 MG 24 hr capsule Take 1 capsule (75 mg total) by mouth daily. 90 capsule 1   No current facility-administered medications on file prior to visit.     Past Medical History:  Diagnosis Date  . Anemia   . BPH (benign prostatic hyperplasia)    Elevated PSA  . Childhood asthma   . Coronary artery disease    post CABG x4 -- in 1995 -- due to three-vessel coronary artery disease  . Diverticular disease   . Dyslipidemia   . First degree AV block   . Gout   . Osteoarthritis    "everywhere" (12/04/2014)  . Prostate cancer [facility]) 2012   Dr Karsten Ro  . Spinal stenosis 2004   L4 radiculopathy, herniated nucleus pulposus L4-5  . Tinnitus   . Type II diabetes mellitus ([place])   . Typical atrial flutter [facility])    s/p ablation    Past Surgical History:  Procedure Laterality Date  . APPENDECTOMY  1943  . ATRIAL FLUTTER ABLATION  12/04/2014  . BACK SURGERY    . CARDIAC CATHETERIZATION  02/10/2010   Est. EF of 60-65% -- Severe three-vessel obstructive atherosclerotic  coronary artery disease -- All grafts were patent including left internal mammary artery graft to left anterior descending coronary artery, saphenous vein graft to the diagonal, saphenous vein graft to obtuse marginal vessel, and saphenous vein graft to the posterior descending coronary artery -- Normal left ventricular function   . COLONOSCOPY     negative X3; Dr Carlean Purl  . CORONARY ARTERY BYPASS GRAFT  10/21/1993   x4 -- using the LIM artery graft to the LAD artery, with saphenous vein grafts to the diagonal branch of the LAD, OM branch the left circumflex coronary artery, and the posterior descending branch of the RCA -- Est. EF of 65%-- Surgeon: Gaye Pollack, M.D.              . ELECTROPHYSIOLOGIC STUDY N/A 10/01/2014   CTI ablation by Dr Curt Bears  . ELECTROPHYSIOLOGIC STUDY  N/A 12/04/2014   repeat CTI ablation by Dr Rayann Heman  . FLEXIBLE SIGMOIDOSCOPY  2005    external hemorrhoids  . JOINT REPLACEMENT    . MICRODISCECTOMY LUMBAR  2004   L4-5, Dr. Arnoldo Morale  . PROSTATE BIOPSY  10/2008   "had radiation tx for prostate cancer"  . SALIVARY STONE REMOVAL  2001  . TOTAL HIP ARTHROPLASTY Right November 2010  . TOTAL HIP ARTHROPLASTY Left 03/2010   Dr Alvan Dame    Social History   Social History  . Marital status: Married    Spouse name: N/A  . Number of children: N/A  . Years of education: N/A   Social History Main Topics  . Smoking status: Former Smoker    Packs/day: 2.50    Years: 15.00    Types: Cigarettes    Quit date: 01/30/1970  . Smokeless tobacco: Never Used     Comment: smoked 1959-1972  . Alcohol use 2.4 oz/week    4 Standard drinks or equivalent per week     Comment: 11/42016 "nothing to drink since 07/25/2014"  . Drug use: No  . Sexual activity: Not Currently   Other Topics Concern  . None   Social History Narrative   Pt lives in [place] with spouse. 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  Enjoys reading and walking.    Family History  Problem Relation Age of Onset  . Diabetes Father   . Hypertension Father   . Lung cancer Father        smoker  . Heart failure Mother 77  . Hyperlipidemia Sister   . Coronary artery disease Sister   . Heart attack Paternal Aunt        X 2; both > 65  . Colon cancer Neg Hx   . Stroke Neg Hx     Review of Systems  Constitutional: Negative for chills and fever.  Eyes: Negative for visual disturbance.  Respiratory: Negative for cough, shortness of breath and wheezing.   Cardiovascular: Negative for chest pain, palpitations (none with ablation) and leg swelling.  Gastrointestinal: Negative for abdominal pain, blood in stool, constipation, diarrhea and nausea.       No gerd  Genitourinary: Negative for dysuria and hematuria.  Musculoskeletal: Positive for arthralgias (hands, toes). Negative for back  pain.  Skin: Negative for color change and rash.       Itchy scalp with sweating  Neurological: Negative for dizziness, light-headedness and headaches.  Psychiatric/Behavioral: The patient is not nervous/anxious.        Objective:   Vitals:   09/27/16 0938  BP: (!) 144/84  Pulse: 87  Resp: 16  Temp: 98.6 F (  37 C)  SpO2: 96%   Filed Weights   09/27/16 0938  Weight: 225 lb (102.1 kg)   Body mass index is 30.52 kg/m.  Wt Readings from Last 3 Encounters:  09/27/16 225 lb (102.1 kg)  06/19/16 227 lb 3.2 oz (103.1 kg)  03/27/16 225 lb (102.1 kg)     Physical Exam Constitutional: He appears well-developed and well-nourished. No distress.  HENT:  Head: Normocephalic and atraumatic.  Right Ear: External ear normal.  Left Ear: External ear normal.  Mouth/Throat: Oropharynx is clear and moist.  Normal ear canals and TM b/l  Eyes: Conjunctivae and EOM are normal.  Neck: Neck supple. No tracheal deviation present. No thyromegaly present.  No carotid bruit  Cardiovascular: Normal rate, regular rhythm, normal heart sounds and intact distal pulses.   No murmur heard. Pulmonary/Chest: Effort normal and breath sounds normal. No respiratory distress. He has no wheezes. He has no rales.  Abdominal: Soft. He exhibits no distension. There is no tenderness.  Genitourinary: deferred  Musculoskeletal: He exhibits no edema.  Lymphadenopathy:   He has no cervical adenopathy.  Skin: Skin is warm and dry. He is not diaphoretic.  Psychiatric: He has a normal mood and affect. His behavior is normal.   Diabetic Foot Exam - Simple   Simple Foot Form Diabetic Foot exam was performed with the following findings:  Yes 09/27/2016 12:40 PM  Visual Inspection No deformities, no ulcerations, no other skin breakdown bilaterally:  Yes Sensation Testing Intact to touch and monofilament testing bilaterally:  Yes Pulse Check Posterior Tibialis and Dorsalis pulse intact bilaterally:  Yes Comments           Assessment & Plan:   Physical exam: Screening blood work  ordered Immunizations  Discussed flu, shingrix Colonoscopy   Up to date  Eye exams   Up to date  EKG  Done by cardio  05/2016 Exercise  None - will get back to the gym Weight advised weight loss Skin  none Substance abuse  none  See Problem List for Assessment and Plan of chronic medical problems.  FU in 6 months

## 2016-09-27 NOTE — Assessment & Plan Note (Signed)
Controlled, stable Continue current dose of medication  

## 2016-09-27 NOTE — Assessment & Plan Note (Signed)
No chest pain, sob, palpitations Stressed increasing exercise and losing weight Continue asa, statin

## 2016-09-27 NOTE — Assessment & Plan Note (Signed)
New BP has  Been elevated on more than one occasion and has CAD, DM Start losartan 25 mg daily Low sodium diet Increase exercise and work on weight loss

## 2016-09-27 NOTE — Assessment & Plan Note (Signed)
No gout attacks in years Controlled, stable Continue current dose of medication

## 2016-09-27 NOTE — Assessment & Plan Note (Signed)
Check a1c Low sugar / carb diet Stressed regular exercise, weight loss Did not tolerate a higher dose of metformin Will consider jardiance if needed

## 2016-09-27 NOTE — Assessment & Plan Note (Signed)
Check lipid panel  Continue daily statin Regular exercise and healthy diet encouraged  

## 2016-09-28 ENCOUNTER — Encounter: Payer: Self-pay | Admitting: Internal Medicine

## 2016-09-28 MED ORDER — EMPAGLIFLOZIN 10 MG PO TABS: 10.0000 mg | ORAL_TABLET | Freq: Every day | ORAL | 1 refills | Status: DC

## 2016-11-13 ENCOUNTER — Ambulatory Visit (INDEPENDENT_AMBULATORY_CARE_PROVIDER_SITE_OTHER): Payer: Medicare Other | Admitting: Internal Medicine

## 2016-11-13 ENCOUNTER — Encounter: Payer: Self-pay | Admitting: Internal Medicine

## 2016-11-13 VITALS — BP 128/66 | HR 112 | Temp 98.6°F | Resp 18 | Wt 221.0 lb

## 2016-11-13 DIAGNOSIS — E1365 Other specified diabetes mellitus with hyperglycemia: Secondary | ICD-10-CM

## 2016-11-13 DIAGNOSIS — J209 Acute bronchitis, unspecified: Secondary | ICD-10-CM | POA: Diagnosis not present

## 2016-11-13 DIAGNOSIS — E1351 Other specified diabetes mellitus with diabetic peripheral angiopathy without gangrene: Secondary | ICD-10-CM | POA: Diagnosis not present

## 2016-11-13 DIAGNOSIS — IMO0002 Reserved for concepts with insufficient information to code with codable children: Secondary | ICD-10-CM

## 2016-11-13 MED ORDER — CEFDINIR 300 MG PO CAPS: 300.0000 mg | ORAL_CAPSULE | Freq: Two times a day (BID) | ORAL | 0 refills | Status: DC

## 2016-11-13 MED ORDER — HYDROCODONE-HOMATROPINE 5-1.5 MG/5ML PO SYRP: 5.0000 mL | ORAL_SOLUTION | Freq: Four times a day (QID) | ORAL | 0 refills | Status: AC | PRN

## 2016-11-13 NOTE — Progress Notes (Signed)
Subjective:    Patient ID: Bryce Maldonado, male    DOB: 1940/08/18, 76 y.o.   MRN: [id number]  HPI He is here for an acute visit.   His symptoms started 2 days ago.  He states nasal congestion, sinus pressure, sore throat, cough, SOB, muscle pain from coughing, and headaches.  He denies fever/chills, wheeze, diarrhea, body aches.    He took [employer] for cough and cold, nyquil.    This reminds him of the bronchitis he had last year.    He has diabetes and his sugars have been controlled since starting jardiance.  He denies any increase in sugars since getting sick.  He does monitor his sugars at least once a day.   Medications and allergies reviewed with patient and updated if appropriate.  Patient Active Problem List   Diagnosis Date Noted  . Hypertension 09/27/2016  . Anxiety 03/27/2016  . SVT (supraventricular tachycardia) ([place]) 12/04/2014  . Typical atrial flutter ([place]) 11/03/2014  . PAF (paroxysmal atrial fibrillation) ([place]) 08/13/2014  . DM (diabetes mellitus), secondary, uncontrolled, with peripheral vascular complications ([place]) 10/93/2355  . History of gout 03/04/2009  . ATOPIC RHINITIS 03/04/2009  . PROSTATE CANCER, HX OF 03/04/2009  . Mixed hyperlipidemia 08/06/2006  . Coronary atherosclerosis 08/06/2006    Current Outpatient Prescriptions on File Prior to Visit  Medication Sig Dispense Refill  . allopurinol (ZYLOPRIM) 300 MG tablet Take 1 tablet (300 mg total) by mouth daily. 90 tablet 1  . aspirin EC 81 MG tablet Take 81 mg by mouth daily.    . empagliflozin (JARDIANCE) 10 MG TABS tablet Take 10 mg by mouth daily. 90 tablet 1  . ezetimibe (ZETIA) 10 MG tablet Take 1 tablet (10 mg total) by mouth at bedtime. 90 tablet 2  . fexofenadine (ALLEGRA) 180 MG tablet Take 180 mg by mouth every morning.     [name] glimepiride (AMARYL) 2 MG tablet TAKE 1 TABLET BY MOUTH DAILY BEFORE BREAKFAST 30 tablet 5  . glucose blood (ONETOUCH VERIO) test strip 1 each by Other route 2  (two) times daily. Use to check  Blood sugars twice a day Dx E11.9 100 each 3  . losartan (COZAAR) 25 MG tablet Take 1 tablet (25 mg total) by mouth daily. 90 tablet 1  . Melatonin 5 MG TABS Take 5 mg by mouth at bedtime.    . metFORMIN (GLUCOPHAGE) 500 MG tablet TAKE 1 TABLET BY MOUTH TWICE DAILY WITH A MEAL 180 tablet 3  . Multiple Vitamins-Minerals (CENTRUM PO) Take 1 tablet by mouth daily.     Glory Rosebush DELICA LANCETS 73U MISC Use to help check blood sugars twice a day Dx E11.9 100 each 3  . rosuvastatin (CRESTOR) 10 MG tablet Take 1 tablet (10 mg total) by mouth at bedtime. 90 tablet 2  . venlafaxine XR (EFFEXOR-XR) 75 MG 24 hr capsule Take 1 capsule (75 mg total) by mouth daily. 90 capsule 1   No current facility-administered medications on file prior to visit.     Past Medical History:  Diagnosis Date  . Anemia   . BPH (benign prostatic hyperplasia)    Elevated PSA  . Childhood asthma   . Coronary artery disease    post CABG x4 -- in 1995 -- due to three-vessel coronary artery disease  . Diverticular disease   . Dyslipidemia   . First degree AV block   . Gout   . Osteoarthritis    "everywhere" (12/04/2014)  . Prostate cancer ([place])  2012   Dr Karsten Ro  . Spinal stenosis 2004   L4 radiculopathy, herniated nucleus pulposus L4-5  . Tinnitus   . Type II diabetes mellitus ([place])   . Typical atrial flutter [facility])    s/p ablation    Past Surgical History:  Procedure Laterality Date  . APPENDECTOMY  1943  . ATRIAL FLUTTER ABLATION  12/04/2014  . BACK SURGERY    . CARDIAC CATHETERIZATION  02/10/2010   Est. EF of 60-65% -- Severe three-vessel obstructive atherosclerotic coronary artery disease -- All grafts were patent including left internal mammary artery graft to left anterior descending coronary artery, saphenous vein graft to the diagonal, saphenous vein graft to obtuse marginal vessel, and saphenous vein graft to the posterior descending coronary artery -- Normal left  ventricular function   . COLONOSCOPY     negative X3; Dr Carlean Purl  . CORONARY ARTERY BYPASS GRAFT  10/21/1993   x4 -- using the LIM artery graft to the LAD artery, with saphenous vein grafts to the diagonal branch of the LAD, OM branch the left circumflex coronary artery, and the posterior descending branch of the RCA -- Est. EF of 65%-- Surgeon: Gaye Pollack, M.D.              . ELECTROPHYSIOLOGIC STUDY N/A 10/01/2014   CTI ablation by Dr Curt Bears  . ELECTROPHYSIOLOGIC STUDY N/A 12/04/2014   repeat CTI ablation by Dr Rayann Heman  . FLEXIBLE SIGMOIDOSCOPY  2005    external hemorrhoids  . JOINT REPLACEMENT    . MICRODISCECTOMY LUMBAR  2004   L4-5, Dr. Arnoldo Morale  . PROSTATE BIOPSY  10/2008   "had radiation tx for prostate cancer"  . SALIVARY STONE REMOVAL  2001  . TOTAL HIP ARTHROPLASTY Right November 2010  . TOTAL HIP ARTHROPLASTY Left 03/2010   Dr Alvan Dame    Social History   Social History  . Marital status: Married    Spouse name: N/A  . Number of children: N/A  . Years of education: N/A   Social History Main Topics  . Smoking status: Former Smoker    Packs/day: 2.50    Years: 15.00    Types: Cigarettes    Quit date: 01/30/1970  . Smokeless tobacco: Never Used     Comment: smoked 1959-1972  . Alcohol use 2.4 oz/week    4 Standard drinks or equivalent per week     Comment: 11/42016 "nothing to drink since 07/25/2014"  . Drug use: No  . Sexual activity: Not Currently   Other Topics Concern  . None   Social History Narrative   Pt lives in [place] with spouse. 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  Enjoys reading and walking.    Family History  Problem Relation Age of Onset  . Diabetes Father   . Hypertension Father   . Lung cancer Father        smoker  . Heart failure Mother 48  . Hyperlipidemia Sister   . Coronary artery disease Sister   . Heart attack Paternal Aunt        X 2; both > 65  . Colon cancer Neg Hx   . Stroke Neg Hx     Review of Systems  Constitutional:  Negative for chills and fever.  HENT: Positive for congestion, sinus pressure and sore throat. Negative for ear pain (resolved) and sinus pain.   Respiratory: Positive for cough (productive, greenish) and shortness of breath (mild). Negative for wheezing.   Cardiovascular: Positive for chest pain (rib pain pain from  cough).  Gastrointestinal: Negative for diarrhea and nausea.  Musculoskeletal: Negative for myalgias.  Neurological: Positive for headaches. Negative for light-headedness.       Objective:   Vitals:   11/13/16 1433  BP: 128/66  Pulse: (!) 112  Resp: 18  Temp: 98.6 F (37 C)  SpO2: 96%   Filed Weights   11/13/16 1433  Weight: 221 lb (100.2 kg)   Body mass index is 29.97 kg/m.  Wt Readings from Last 3 Encounters:  11/13/16 221 lb (100.2 kg)  09/27/16 225 lb (102.1 kg)  06/19/16 227 lb 3.2 oz (103.1 kg)     Physical Exam GENERAL APPEARANCE: Appears stated age, well appearing, NAD EYES: conjunctiva clear, no icterus HEENT: bilateral tympanic membranes and ear canals normal, oropharynx with mild erythema, no thyromegaly, trachea midline, no cervical or supraclavicular lymphadenopathy LUNGS: Clear to auscultation without wheeze or crackles, unlabored breathing, good air entry bilaterally, course BS HEART: Normal S1,S2 without murmurs EXTREMITIES: Without clubbing, cyanosis, or edema        Assessment & Plan:

## 2016-11-13 NOTE — Assessment & Plan Note (Addendum)
Likely bacterial and given his history I will start an antibiotic omnicef BID x 10 days Hycodan for cough prn Deferred inhaler, but will call if he needs one otc cold meds prn Call if no improvement

## 2016-11-13 NOTE — Patient Instructions (Addendum)
Take the antibiotic and cough syrup as prescribed.  If you are not feeling better or your symptoms worsen please call.    Monitor your sugars with the cold to make sure they are not too high.

## 2016-11-13 NOTE — Assessment & Plan Note (Signed)
Well controlled at home since starting jardiance Continue current medications Will monitor sugar while sick and call if elevated

## 2016-11-23 ENCOUNTER — Ambulatory Visit (INDEPENDENT_AMBULATORY_CARE_PROVIDER_SITE_OTHER): Payer: Medicare Other | Admitting: Nurse Practitioner

## 2016-11-23 ENCOUNTER — Ambulatory Visit (INDEPENDENT_AMBULATORY_CARE_PROVIDER_SITE_OTHER)
Admission: RE | Admit: 2016-11-23 | Discharge: 2016-11-23 | Disposition: A | Discharge status: Home / Self Care | Payer: Medicare Other | Source: Ambulatory Visit | Attending: Nurse Practitioner | Admitting: Nurse Practitioner

## 2016-11-23 ENCOUNTER — Encounter: Payer: Self-pay | Admitting: Nurse Practitioner

## 2016-11-23 VITALS — BP 110/70 | HR 78 | Temp 97.7°F | Ht 72.0 in | Wt 217.0 lb

## 2016-11-23 DIAGNOSIS — J209 Acute bronchitis, unspecified: Secondary | ICD-10-CM | POA: Diagnosis not present

## 2016-11-23 DIAGNOSIS — R6883 Chills (without fever): Secondary | ICD-10-CM

## 2016-11-23 MED ORDER — ALBUTEROL SULFATE HFA 108 (90 BASE) MCG/ACT IN AERS: 1.0000 | INHALATION_SPRAY | Freq: Four times a day (QID) | RESPIRATORY_TRACT | 0 refills | Status: DC | PRN

## 2016-11-23 MED ORDER — DM-GUAIFENESIN ER 30-600 MG PO TB12: 1.0000 | ORAL_TABLET | Freq: Two times a day (BID) | ORAL | 0 refills | Status: DC | PRN

## 2016-11-23 MED ORDER — BENZONATATE 100 MG PO CAPS: 100.0000 mg | ORAL_CAPSULE | Freq: Three times a day (TID) | ORAL | 0 refills | Status: DC | PRN

## 2016-11-23 NOTE — Progress Notes (Signed)
Subjective:  Patient ID: Bryce Maldonado, male    DOB: 1940-03-18  Age: 76 y.o. MRN: [id number]  CC: Cough (congestion,coughing at night (deep). finish abx last night. cough med is not helping. )   Cough  This is a new problem. The current episode started 1 to 4 weeks ago. The problem has been unchanged. The cough is productive of purulent sputum. Associated symptoms include chest pain, chills, nasal congestion, postnasal drip, rhinorrhea, shortness of breath, sweats and wheezing. Pertinent negatives include no fever or headaches. The symptoms are aggravated by lying down. He has tried prescription cough suppressant and rest (abd cefdinir x 10days) for the symptoms. The treatment provided no relief. His past medical history is significant for bronchitis, emphysema and environmental allergies.    Outpatient Medications Prior to Visit  Medication Sig Dispense Refill  . allopurinol (ZYLOPRIM) 300 MG tablet Take 1 tablet (300 mg total) by mouth daily. 90 tablet 1  . aspirin EC 81 MG tablet Take 81 mg by mouth daily.    [name] ezetimibe (ZETIA) 10 MG tablet Take 1 tablet (10 mg total) by mouth at bedtime. 90 tablet 2  . fexofenadine (ALLEGRA) 180 MG tablet Take 180 mg by mouth every morning.     [name] glimepiride (AMARYL) 2 MG tablet TAKE 1 TABLET BY MOUTH DAILY BEFORE BREAKFAST 30 tablet 5  . glucose blood (ONETOUCH VERIO) test strip 1 each by Other route 2 (two) times daily. Use to check  Blood sugars twice a day Dx E11.9 100 each 3  . HYDROcodone-homatropine (HYCODAN) 5-1.5 MG/5ML syrup Take 5 mLs by mouth every 6 (six) hours as needed for cough. 180 mL 0  . losartan (COZAAR) 25 MG tablet Take 1 tablet (25 mg total) by mouth daily. 90 tablet 1  . Melatonin 5 MG TABS Take 5 mg by mouth at bedtime.    . metFORMIN (GLUCOPHAGE) 500 MG tablet TAKE 1 TABLET BY MOUTH TWICE DAILY WITH A MEAL 180 tablet 3  . Multiple Vitamins-Minerals (CENTRUM PO) Take 1 tablet by mouth daily.     Glory Rosebush DELICA LANCETS 00X  MISC Use to help check blood sugars twice a day Dx E11.9 100 each 3  . rosuvastatin (CRESTOR) 10 MG tablet Take 1 tablet (10 mg total) by mouth at bedtime. 90 tablet 2  . venlafaxine XR (EFFEXOR-XR) 75 MG 24 hr capsule Take 1 capsule (75 mg total) by mouth daily. 90 capsule 1  . cefdinir (OMNICEF) 300 MG capsule Take 1 capsule (300 mg total) by mouth 2 (two) times daily. 20 capsule 0  . empagliflozin (JARDIANCE) 10 MG TABS tablet Take 10 mg by mouth daily. (Patient not taking: Reported on 11/23/2016) 90 tablet 1   No facility-administered medications prior to visit.     ROS See HPI  Objective:  BP 110/70   Pulse 78   Temp 97.7 F (36.5 C)   Ht 6' (1.829 m)   Wt 217 lb (98.4 kg)   SpO2 97%   BMI 29.43 kg/m   BP Readings from Last 3 Encounters:  11/23/16 110/70  11/13/16 128/66  09/27/16 (!) 144/84    Wt Readings from Last 3 Encounters:  11/23/16 217 lb (98.4 kg)  11/13/16 221 lb (100.2 kg)  09/27/16 225 lb (102.1 kg)    Physical Exam  Constitutional: He is oriented to person, place, and time. No distress.  Cardiovascular: Normal rate and regular rhythm.   Pulmonary/Chest: Effort normal and breath sounds normal. No respiratory distress.  Musculoskeletal:  He exhibits no edema.  Neurological: He is alert and oriented to person, place, and time.  Skin: Skin is warm and dry.  Vitals reviewed.   Lab Results  Component Value Date   WBC 6.2 09/27/2016   HGB 15.3 09/27/2016   HCT 46.1 09/27/2016   PLT 315.0 09/27/2016   GLUCOSE 203 (H) 09/27/2016   CHOL 132 09/27/2016   TRIG 107.0 09/27/2016   HDL 44.90 09/27/2016   LDLDIRECT 86.2 06/24/2010   LDLCALC 66 09/27/2016   ALT 18 09/27/2016   AST 13 09/27/2016   NA 138 09/27/2016   K 4.6 09/27/2016   CL 100 09/27/2016   CREATININE 0.76 09/27/2016   BUN 16 09/27/2016   CO2 31 09/27/2016   TSH 0.87 09/27/2016   PSA 6.12 (H) 08/06/2006   INR 0.97 03/04/2010   HGBA1C 7.9 (H) 09/27/2016   MICROALBUR 1.1 09/27/2016     Dg Chest 2 View  Result Date: 02/02/2015 CLINICAL DATA:  Patient having cough and congestion for 4 days. Ex-smoker. EXAM: CHEST  2 VIEW COMPARISON:  02/07/2010. FINDINGS: Normal cardiac and mediastinal silhouette. Previous CABG with median sternotomy. Mild scarring at the lung bases but no active infiltrates or failure. Lungs hyperaerated. No effusion or pneumothorax. No osseous findings of significance. IMPRESSION: No active cardiopulmonary disease.  Suspected COPD. Electronically Signed   By: Staci Righter M.D.   On: 02/02/2015 12:57    Assessment & Plan:   Bryce Maldonado was seen today for cough.  Diagnoses and all orders for this visit:  Acute bronchitis, unspecified organism -     DG Chest 2 View; Future -     albuterol (PROVENTIL HFA;VENTOLIN HFA) 108 (90 Base) MCG/ACT inhaler; Inhale 1-2 puffs into the lungs every 6 (six) hours as needed. -     benzonatate (TESSALON) 100 MG capsule; Take 1 capsule (100 mg total) by mouth 3 (three) times daily as needed for cough. -     dextromethorphan-guaiFENesin (MUCINEX DM) 30-600 MG 12hr tablet; Take 1 tablet by mouth 2 (two) times daily as needed for cough.  Chills without fever -     DG Chest 2 View; Future   I have discontinued Mr. Bryce Maldonado cefdinir. I am also having him start on albuterol, benzonatate, and dextromethorphan-guaiFENesin. Additionally, I am having him maintain his Multiple Vitamins-Minerals (CENTRUM PO), fexofenadine, glucose blood, ONETOUCH DELICA LANCETS 35T, Melatonin, aspirin EC, allopurinol, metFORMIN, glimepiride, venlafaxine XR, rosuvastatin, ezetimibe, losartan, empagliflozin, and HYDROcodone-homatropine.  Meds ordered this encounter  Medications  . albuterol (PROVENTIL HFA;VENTOLIN HFA) 108 (90 Base) MCG/ACT inhaler    Sig: Inhale 1-2 puffs into the lungs every 6 (six) hours as needed.    Dispense:  1 Inhaler    Refill:  0    Order Specific Question:   Supervising Provider    Answer:   Binnie Rail [[id number]]  .  benzonatate (TESSALON) 100 MG capsule    Sig: Take 1 capsule (100 mg total) by mouth 3 (three) times daily as needed for cough.    Dispense:  20 capsule    Refill:  0    Order Specific Question:   Supervising Provider    Answer:   Binnie Rail [[id number]]  . dextromethorphan-guaiFENesin (MUCINEX DM) 30-600 MG 12hr tablet    Sig: Take 1 tablet by mouth 2 (two) times daily as needed for cough.    Dispense:  14 tablet    Refill:  0    Order Specific Question:   Supervising Provider    Answer:  BURNS, Claudina Lick [[id number]]    Follow-up: Return if symptoms worsen or fail to improve.  Wilfred Lacy, NP

## 2016-11-23 NOTE — Patient Instructions (Addendum)
Normal CXR. Treated for acute bronchitis. He declined any systemic corticosteroids due to possible effects on glucose.

## 2016-12-19 ENCOUNTER — Other Ambulatory Visit: Payer: Self-pay | Admitting: Nurse Practitioner

## 2016-12-19 ENCOUNTER — Other Ambulatory Visit (INDEPENDENT_AMBULATORY_CARE_PROVIDER_SITE_OTHER): Payer: Medicare Other

## 2016-12-19 DIAGNOSIS — E1365 Other specified diabetes mellitus with hyperglycemia: Secondary | ICD-10-CM

## 2016-12-19 DIAGNOSIS — J209 Acute bronchitis, unspecified: Secondary | ICD-10-CM

## 2016-12-19 DIAGNOSIS — IMO0002 Reserved for concepts with insufficient information to code with codable children: Secondary | ICD-10-CM

## 2016-12-19 DIAGNOSIS — E1351 Other specified diabetes mellitus with diabetic peripheral angiopathy without gangrene: Secondary | ICD-10-CM

## 2016-12-19 LAB — HEMOGLOBIN A1C: HEMOGLOBIN A1C: 6.7 % — AB (ref 4.6–6.5)

## 2016-12-21 ENCOUNTER — Encounter: Payer: Self-pay | Admitting: Internal Medicine

## 2016-12-26 ENCOUNTER — Other Ambulatory Visit: Payer: Self-pay | Admitting: Internal Medicine

## 2017-03-21 ENCOUNTER — Other Ambulatory Visit: Payer: Self-pay | Admitting: Internal Medicine

## 2017-03-22 ENCOUNTER — Other Ambulatory Visit: Payer: Self-pay

## 2017-03-22 ENCOUNTER — Encounter: Payer: Self-pay | Admitting: Cardiology

## 2017-03-22 MED ORDER — EZETIMIBE 10 MG PO TABS: 10.0000 mg | ORAL_TABLET | Freq: Every day | ORAL | 2 refills | Status: DC

## 2017-03-22 MED ORDER — ROSUVASTATIN CALCIUM 10 MG PO TABS: 10.0000 mg | ORAL_TABLET | Freq: Every day | ORAL | 2 refills | Status: DC

## 2017-03-24 NOTE — Progress Notes (Signed)
[facility] [address] [place], [place]  [address] Phone: [phone] Fax:  [phone]  Date:  03/29/2017   ID:  HENDRICKS SCHWANDT, DOB [birth year], MRN [id number]  PCP:  Binnie Rail, MD  Cardiologist:  Dr. Dejana Pugsley [place]     History of Present Illness: CAIDON FOTI is a 77 y.o. male is seen for follow up CAD and atrial flutter.   He has a hx of CAD, s/p CABG in 1995, HL, prostate CA, DJD, spinal stenosis. Prior to left total hip replacement in 03/2010 he underwent a Lexiscan Myoview. This demonstrated an EF of 66% and inferior ischemia. LHC 01/2010: Proximal LAD 90%, proximal OM totally occluded, RCA occluded, SVG-PDA patent, SVG-diagonal patent, SVG-OM patent, LIMA-LAD patent. There were left to right collaterals. EF 60-65%. He was treated medically.   In 2016 he had new onset atrial flutter. He was anticoagulated and underwent Ablation by Dr. Rayann Heman on October 01, 2014. He had repeat ablation on Nov. 4 2016. No recurrent arrhythmia since.  On follow up today he is doing very well. He denies any chest pain, dyspnea, palpitations, dizziness, edema. Diabetes is doing well. Now on Jardiance. He is walking daily.  Wt Readings from Last 3 Encounters:  03/29/17 215 lb 8 oz (97.8 kg)  03/29/17 214 lb (97.1 kg)  03/29/17 214 lb (97.1 kg)     Past Medical History:  Diagnosis Date  . Anemia   . BPH (benign prostatic hyperplasia)    Elevated PSA  . Childhood asthma   . Coronary artery disease    post CABG x4 -- in 1995 -- due to three-vessel coronary artery disease  . Diverticular disease   . Dyslipidemia   . First degree AV block   . Gout   . Osteoarthritis    "everywhere" (12/04/2014)  . Prostate cancer [facility]) 2012   Dr Karsten Ro  . Spinal stenosis 2004   L4 radiculopathy, herniated nucleus pulposus L4-5  . Tinnitus   . Type II diabetes mellitus ([place])   . Typical atrial flutter [facility])    s/p ablation    Current Outpatient Medications  Medication Sig Dispense  Refill  . albuterol (PROAIR HFA) 108 (90 Base) MCG/ACT inhaler Inhale 1-2 puffs into the lungs every 6 (six) hours as needed for wheezing or shortness of breath. 1 Inhaler 5  . allopurinol (ZYLOPRIM) 300 MG tablet Take 1 tablet (300 mg total) by mouth daily. 90 tablet 1  . aspirin EC 81 MG tablet Take 81 mg by mouth daily.    [name] ezetimibe (ZETIA) 10 MG tablet Take 1 tablet (10 mg total) by mouth at bedtime. 90 tablet 2  . fexofenadine (ALLEGRA) 180 MG tablet Take 180 mg by mouth every morning.     [name] glucose blood (ONETOUCH VERIO) test strip 1 each by Other route 2 (two) times daily. Use to check  Blood sugars twice a day Dx E11.9 100 each 3  . JARDIANCE 10 MG TABS tablet TAKE 1 TABLET BY MOUTH DAILY 90 tablet 0  . losartan (COZAAR) 25 MG tablet TAKE 1 TABLET(25 MG) BY MOUTH DAILY 90 tablet 0  . Melatonin 5 MG TABS Take 5 mg by mouth at bedtime.    . metFORMIN (GLUCOPHAGE) 500 MG tablet TAKE 1 TABLET BY MOUTH TWICE DAILY WITH A MEAL 180 tablet 0  . Multiple Vitamins-Minerals (CENTRUM PO) Take 1 tablet by mouth daily.     Glory Rosebush DELICA LANCETS 06C MISC Use to help check blood sugars twice  a day Dx E11.9 100 each 3  . rosuvastatin (CRESTOR) 10 MG tablet Take 1 tablet (10 mg total) by mouth at bedtime. 90 tablet 2  . venlafaxine XR (EFFEXOR-XR) 75 MG 24 hr capsule TAKE 1 CAPSULE(75 MG) BY MOUTH DAILY 90 capsule 1   No current facility-administered medications for this visit.     Allergies:    Allergies  Allergen Reactions  . Naproxen Sodium     rash    Social History:  The patient  reports that he quit smoking about 47 years ago. His smoking use included cigarettes. He has a 37.50 pack-year smoking history. he has never used smokeless tobacco. He reports that he drinks about 2.4 oz of alcohol per week. He reports that he does not use drugs.   ROS:  Please see the history of present illness.      All other systems reviewed and negative.   PHYSICAL EXAM: VS:  BP (!) 142/70   Pulse 86    Ht 6' (1.829 m)   Wt 215 lb 8 oz (97.8 kg)   BMI 29.23 kg/m  GENERAL:  Well appearing WM in NAD HEENT:  PERRL, EOMI, sclera are clear. Oropharynx is clear. NECK:  No jugular venous distention, carotid upstroke brisk and symmetric, no bruits, no thyromegaly or adenopathy LUNGS:  Clear to auscultation bilaterally CHEST:  Unremarkable HEART:  RRR,  PMI not displaced or sustained,S1 and S2 within normal limits, no S3, no S4: no clicks, no rubs, no murmurs ABD:  Soft, nontender. BS +, no masses or bruits. No hepatomegaly, no splenomegaly EXT:  2 + pulses throughout, no edema, no cyanosis no clubbing SKIN:  Warm and dry.  No rashes NEURO:  Alert and oriented x 3. Cranial nerves II through XII intact. PSYCH:  Cognitively intact    Laboratory data:  Lab Results  Component Value Date   WBC 6.2 09/27/2016   HGB 15.3 09/27/2016   HCT 46.1 09/27/2016   PLT 315.0 09/27/2016   GLUCOSE 203 (H) 09/27/2016   CHOL 132 09/27/2016   TRIG 107.0 09/27/2016   HDL 44.90 09/27/2016   LDLDIRECT 86.2 06/24/2010   LDLCALC 66 09/27/2016   ALT 18 09/27/2016   AST 13 09/27/2016   NA 138 09/27/2016   K 4.6 09/27/2016   CL 100 09/27/2016   CREATININE 0.76 09/27/2016   BUN 16 09/27/2016   CO2 31 09/27/2016   TSH 0.87 09/27/2016   PSA 6.12 (H) 08/06/2006   INR 0.97 03/04/2010   HGBA1C 6.7 03/29/2017   MICROALBUR 1.1 09/27/2016     ASSESSMENT AND PLAN:  1. CAD: s/p CABG 1995.  He is active and asymptomatic.  He had a LHC in 2012 that demonstrated patent bypass grafts.  Continue medical Rx.  2. Atrial flutter- s/p successful ablation. Maintaining NSR. Will observe for recurrence of flutter or fib.   3.  Hyperlipidemia:  Well controlled on statin.  4.   DM per primary care. A1c 6.7%.   5. First degree AV block. PR interval increased from 216>> 334 msec. No symptoms. Will monitor.  I will follow up in one year.  Signed, Rayma Hegg [place] MD, [facility]

## 2017-03-28 NOTE — Patient Instructions (Addendum)
Your a1c was checked today.   All other Health Maintenance issues reviewed.   All recommended immunizations and age-appropriate screenings are up-to-date or discussed.  No immunizations administered today.   Medications reviewed and updated.  Changes include discontinuing the Amaryl.    Your prescription(s) have been submitted to your pharmacy. Please take as directed and contact our office if you believe you are having problem(s) with the medication(s).   Please followup in 6 months

## 2017-03-28 NOTE — Progress Notes (Signed)
Subjective:    Patient ID: Bryce Maldonado, male    DOB: Jun 17, 1940, 77 y.o.   MRN: [id number]  HPI The patient is here for follow up.  Diabetes: He is taking his medication daily as prescribed. He is somewhat compliant with a diabetic diet, but could do better. He is exercising regularly - walking about 5000 steps daily. He monitors his sugars and they have been running 120-160's. He checks his feet daily and denies foot lesions. He is up-to-date with an ophthalmology examination.   CAD, Hypertension: He is taking his medication daily. He is compliant with a low sodium diet.  He denies chest pain, palpitations, edema, shortness of breath and regular headaches. He is exercising regularly.     Hyperlipidemia: He is taking his medication daily. He is compliant with a low fat/cholesterol diet. He is exercising regularly. He denies myalgias.   History of gout:  He is taking allopurinol daily as prescribed.  Since starting the medication years ago he has not had any gout symptoms.  Anxiety: He is taking his medication daily as prescribed. He denies any side effects from the medication. He feels his anxiety is well controlled and he is happy with his current dose of medication.    Medications and allergies reviewed with patient and updated if appropriate.  Patient Active Problem List   Diagnosis Date Noted  . Hypertension 09/27/2016  . Anxiety 03/27/2016  . SVT (supraventricular tachycardia) ([place]) 12/04/2014  . Typical atrial flutter ([place]) 11/03/2014  . PAF (paroxysmal atrial fibrillation) ([place]) 08/13/2014  . DM (diabetes mellitus), secondary, uncontrolled, with peripheral vascular complications ([place]) 07/23/7626  . History of gout 03/04/2009  . ATOPIC RHINITIS 03/04/2009  . PROSTATE CANCER, HX OF 03/04/2009  . Mixed hyperlipidemia 08/06/2006  . Coronary atherosclerosis 08/06/2006    Current Outpatient Medications on File Prior to Visit  Medication Sig Dispense Refill  . albuterol  (PROAIR HFA) 108 (90 Base) MCG/ACT inhaler Inhale 1-2 puffs into the lungs every 6 (six) hours as needed for wheezing or shortness of breath. 1 Inhaler 5  . allopurinol (ZYLOPRIM) 300 MG tablet Take 1 tablet (300 mg total) by mouth daily. 90 tablet 1  . aspirin EC 81 MG tablet Take 81 mg by mouth daily.    [name] ezetimibe (ZETIA) 10 MG tablet Take 1 tablet (10 mg total) by mouth at bedtime. 90 tablet 2  . fexofenadine (ALLEGRA) 180 MG tablet Take 180 mg by mouth every morning.     [name] glimepiride (AMARYL) 2 MG tablet TAKE 1 TABLET BY MOUTH DAILY BEFORE BREAKFAST 30 tablet 5  . glucose blood (ONETOUCH VERIO) test strip 1 each by Other route 2 (two) times daily. Use to check  Blood sugars twice a day Dx E11.9 100 each 3  . JARDIANCE 10 MG TABS tablet TAKE 1 TABLET BY MOUTH DAILY 90 tablet 0  . losartan (COZAAR) 25 MG tablet TAKE 1 TABLET(25 MG) BY MOUTH DAILY 90 tablet 0  . Melatonin 5 MG TABS Take 5 mg by mouth at bedtime.    . metFORMIN (GLUCOPHAGE) 500 MG tablet TAKE 1 TABLET BY MOUTH TWICE DAILY WITH A MEAL 180 tablet 0  . Multiple Vitamins-Minerals (CENTRUM PO) Take 1 tablet by mouth daily.     Glory Rosebush DELICA LANCETS 31D MISC Use to help check blood sugars twice a day Dx E11.9 100 each 3  . rosuvastatin (CRESTOR) 10 MG tablet Take 1 tablet (10 mg total) by mouth at bedtime. 90 tablet 2  .  venlafaxine XR (EFFEXOR-XR) 75 MG 24 hr capsule TAKE 1 CAPSULE(75 MG) BY MOUTH DAILY 90 capsule 1   No current facility-administered medications on file prior to visit.     Past Medical History:  Diagnosis Date  . Anemia   . BPH (benign prostatic hyperplasia)    Elevated PSA  . Childhood asthma   . Coronary artery disease    post CABG x4 -- in 1995 -- due to three-vessel coronary artery disease  . Diverticular disease   . Dyslipidemia   . First degree AV block   . Gout   . Osteoarthritis    "everywhere" (12/04/2014)  . Prostate cancer [facility]) 2012   Dr Karsten Ro  . Spinal stenosis 2004   L4  radiculopathy, herniated nucleus pulposus L4-5  . Tinnitus   . Type II diabetes mellitus ([place])   . Typical atrial flutter [place])    s/p ablation    Past Surgical History:  Procedure Laterality Date  . APPENDECTOMY  1943  . ATRIAL FLUTTER ABLATION  12/04/2014  . BACK SURGERY    . CARDIAC CATHETERIZATION  02/10/2010   Est. EF of 60-65% -- Severe three-vessel obstructive atherosclerotic coronary artery disease -- All grafts were patent including left internal mammary artery graft to left anterior descending coronary artery, saphenous vein graft to the diagonal, saphenous vein graft to obtuse marginal vessel, and saphenous vein graft to the posterior descending coronary artery -- Normal left ventricular function   . COLONOSCOPY     negative X3; Dr Carlean Purl  . CORONARY ARTERY BYPASS GRAFT  10/21/1993   x4 -- using the LIM artery graft to the LAD artery, with saphenous vein grafts to the diagonal branch of the LAD, OM branch the left circumflex coronary artery, and the posterior descending branch of the RCA -- Est. EF of 65%-- Surgeon: Gaye Pollack, M.D.              . ELECTROPHYSIOLOGIC STUDY N/A 10/01/2014   CTI ablation by Dr Curt Bears  . ELECTROPHYSIOLOGIC STUDY N/A 12/04/2014   repeat CTI ablation by Dr Rayann Heman  . FLEXIBLE SIGMOIDOSCOPY  2005    external hemorrhoids  . JOINT REPLACEMENT    . MICRODISCECTOMY LUMBAR  2004   L4-5, Dr. Arnoldo Morale  . PROSTATE BIOPSY  10/2008   "had radiation tx for prostate cancer"  . SALIVARY STONE REMOVAL  2001  . TOTAL HIP ARTHROPLASTY Right November 2010  . TOTAL HIP ARTHROPLASTY Left 03/2010   Dr Alvan Dame    Social History   Socioeconomic History  . Marital status: Married    Spouse name: None  . Number of children: None  . Years of education: None  . Highest education level: None  Social Needs  . Financial resource strain: Not hard at all  . Food insecurity - worry: Never true  . Food insecurity - inability: Never true  . Transportation needs -  medical: No  . Transportation needs - non-medical: No  Occupational History  . None  Tobacco Use  . Smoking status: Former Smoker    Packs/day: 2.50    Years: 15.00    Pack years: 37.50    Types: Cigarettes    Last attempt to quit: 01/30/1970    Years since quitting: 47.1  . Smokeless tobacco: Never Used  . Tobacco comment: smoked 1959-1972  Substance and Sexual Activity  . Alcohol use: Yes    Alcohol/week: 2.4 oz    Types: 4 Standard drinks or equivalent per week    Comment: 11/42016 "  nothing to drink since 07/25/2014"  . Drug use: No  . Sexual activity: Not Currently  Other Topics Concern  . None  Social History Narrative   Pt lives in [place] with spouse. 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  Enjoys reading and walking.    Family History  Problem Relation Age of Onset  . Diabetes Father   . Hypertension Father   . Lung cancer Father        smoker  . Heart failure Mother 18  . Hyperlipidemia Sister   . Coronary artery disease Sister   . Heart attack Paternal Aunt        X 2; both > 65  . Colon cancer Neg Hx   . Stroke Neg Hx     Review of Systems  Constitutional: Negative for chills and fever.  HENT: Positive for postnasal drip.   Respiratory: Positive for cough (PND related). Negative for shortness of breath and wheezing.   Cardiovascular: Negative for chest pain, palpitations and leg swelling.  Neurological: Positive for headaches (occ sinus HA). Negative for light-headedness.       Objective:   Vitals:   03/29/17 1043  BP: 138/72  Pulse: 87  Resp: 16  Temp: (!) 97.5 F (36.4 C)  SpO2: 98%   Wt Readings from Last 3 Encounters:  03/29/17 214 lb (97.1 kg)  03/29/17 214 lb (97.1 kg)  11/23/16 217 lb (98.4 kg)   Body mass index is 29.02 kg/m.   Physical Exam    Constitutional: Appears well-developed and well-nourished. No distress.  HENT:  Head: Normocephalic and atraumatic.  Neck: Neck supple. No tracheal deviation present. No thyromegaly  present.  No cervical lymphadenopathy Cardiovascular: Normal rate, regular rhythm and normal heart sounds.  2/6 systolic murmur heard. No carotid bruit .  No edema Pulmonary/Chest: Effort normal and breath sounds normal. No respiratory distress. No has no wheezes. No rales.  Skin: Skin is warm and dry. Not diaphoretic.  Psychiatric: Normal mood and affect. Behavior is normal.      Assessment & Plan:    See Problem List for Assessment and Plan of chronic medical problems.

## 2017-03-28 NOTE — Progress Notes (Addendum)
Subjective:   Bryce Maldonado is a 77 y.o. male who presents for Medicare Annual/Subsequent preventive examination.  Review of Systems:  No ROS.  Medicare Wellness Visit. Additional risk factors are reflected in the social history.  Cardiac Risk Factors include: advanced age (>58men, >89 women);diabetes mellitus;dyslipidemia;hypertension;male gender Sleep patterns: feels rested on waking, gets up 1-2 times nightly to void and sleeps 7-8 hours nightly.   Home Safety/Smoke Alarms: Feels safe in home. Smoke alarms in place.  Living environment; residence and Firearm Safety: [place], can live on one level, no firearms. Seat Belt Safety/Bike Helmet: Wears seat belt.   PSA-  Lab Results  Component Value Date   PSA 6.12 (H) 08/06/2006       Objective:    Vitals: BP 138/72   Pulse 87   Temp (!) 97.5 F (36.4 C)   Resp 18   Ht 6' (1.829 m)   Wt 214 lb (97.1 kg)   SpO2 98%   BMI 29.02 kg/m   Body mass index is 29.02 kg/m.  Advanced Directives 03/29/2017 02/02/2015 12/04/2014 12/04/2014 10/01/2014  Does Patient Have a Medical Advance Directive? Yes No Yes Yes Yes  Type of Paramedic of [place];Living will - - [place];Living will [place];Living will  Does patient want to make changes to medical advance directive? - - No - Patient declined - No - Patient declined  Copy of [place] in Chart? No - copy requested - No - copy requested - Yes  Would patient like information on creating a medical advance directive? - No - patient declined information - - -    Tobacco Social History   Tobacco Use  Smoking Status Former Smoker  . Packs/day: 2.50  . Years: 15.00  . Pack years: 37.50  . Types: Cigarettes  . Last attempt to quit: 01/30/1970  . Years since quitting: 47.1  Smokeless Tobacco Never Used  Tobacco Comment   smoked 1959-1972     Counseling given: Not Answered Comment: smoked  1959-1972  Past Medical History:  Diagnosis Date  . Anemia   . BPH (benign prostatic hyperplasia)    Elevated PSA  . Childhood asthma   . Coronary artery disease    post CABG x4 -- in 1995 -- due to three-vessel coronary artery disease  . Diverticular disease   . Dyslipidemia   . First degree AV block   . Gout   . Osteoarthritis    "everywhere" (12/04/2014)  . Prostate cancer [facility]) 2012   Dr Karsten Ro  . Spinal stenosis 2004   L4 radiculopathy, herniated nucleus pulposus L4-5  . Tinnitus   . Type II diabetes mellitus ([place])   . Typical atrial flutter [facility])    s/p ablation   Past Surgical History:  Procedure Laterality Date  . APPENDECTOMY  1943  . ATRIAL FLUTTER ABLATION  12/04/2014  . BACK SURGERY    . CARDIAC CATHETERIZATION  02/10/2010   Est. EF of 60-65% -- Severe three-vessel obstructive atherosclerotic coronary artery disease -- All grafts were patent including left internal mammary artery graft to left anterior descending coronary artery, saphenous vein graft to the diagonal, saphenous vein graft to obtuse marginal vessel, and saphenous vein graft to the posterior descending coronary artery -- Normal left ventricular function   . COLONOSCOPY     negative X3; Dr Carlean Purl  . CORONARY ARTERY BYPASS GRAFT  10/21/1993   x4 -- using the LIM artery graft to the LAD  artery, with saphenous vein grafts to the diagonal branch of the LAD, OM branch the left circumflex coronary artery, and the posterior descending branch of the RCA -- Est. EF of 65%-- Surgeon: Gaye Pollack, M.D.              . ELECTROPHYSIOLOGIC STUDY N/A 10/01/2014   CTI ablation by Dr Curt Bears  . ELECTROPHYSIOLOGIC STUDY N/A 12/04/2014   repeat CTI ablation by Dr Rayann Heman  . FLEXIBLE SIGMOIDOSCOPY  2005    external hemorrhoids  . JOINT REPLACEMENT    . MICRODISCECTOMY LUMBAR  2004   L4-5, Dr. Arnoldo Morale  . PROSTATE BIOPSY  10/2008   "had radiation tx for prostate cancer"  . SALIVARY STONE REMOVAL  2001  . TOTAL HIP  ARTHROPLASTY Right November 2010  . TOTAL HIP ARTHROPLASTY Left 03/2010   Dr Alvan Dame   Family History  Problem Relation Age of Onset  . Diabetes Father   . Hypertension Father   . Lung cancer Father        smoker  . Heart failure Mother 61  . Hyperlipidemia Sister   . Coronary artery disease Sister   . Heart attack Paternal Aunt        X 2; both > 65  . Colon cancer Neg Hx   . Stroke Neg Hx    Social History   Socioeconomic History  . Marital status: Married    Spouse name: None  . Number of children: None  . Years of education: None  . Highest education level: None  Social Needs  . Financial resource strain: Not hard at all  . Food insecurity - worry: Never true  . Food insecurity - inability: Never true  . Transportation needs - medical: No  . Transportation needs - non-medical: No  Occupational History  . None  Tobacco Use  . Smoking status: Former Smoker    Packs/day: 2.50    Years: 15.00    Pack years: 37.50    Types: Cigarettes    Last attempt to quit: 01/30/1970    Years since quitting: 47.1  . Smokeless tobacco: Never Used  . Tobacco comment: smoked 1959-1972  Substance and Sexual Activity  . Alcohol use: Yes    Alcohol/week: 2.4 oz    Types: 4 Standard drinks or equivalent per week    Comment: 11/42016 "nothing to drink since 07/25/2014"  . Drug use: No  . Sexual activity: Not Currently  Other Topics Concern  . None  Social History Narrative   Pt lives in [place] with spouse. 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  Enjoys reading and walking.    Outpatient Encounter Medications as of 03/29/2017  Medication Sig  . albuterol (PROAIR HFA) 108 (90 Base) MCG/ACT inhaler Inhale 1-2 puffs into the lungs every 6 (six) hours as needed for wheezing or shortness of breath.  . allopurinol (ZYLOPRIM) 300 MG tablet Take 1 tablet (300 mg total) by mouth daily.  [name] aspirin EC 81 MG tablet Take 81 mg by mouth daily.  [name] ezetimibe (ZETIA) 10 MG tablet Take 1 tablet (10 mg  total) by mouth at bedtime.  . fexofenadine (ALLEGRA) 180 MG tablet Take 180 mg by mouth every morning.   [name] glucose blood (ONETOUCH VERIO) test strip 1 each by Other route 2 (two) times daily. Use to check  Blood sugars twice a day Dx E11.9  . JARDIANCE 10 MG TABS tablet TAKE 1 TABLET BY MOUTH DAILY  . losartan (COZAAR) 25 MG tablet TAKE 1 TABLET(25 MG)  BY MOUTH DAILY  . Melatonin 5 MG TABS Take 5 mg by mouth at bedtime.  . metFORMIN (GLUCOPHAGE) 500 MG tablet TAKE 1 TABLET BY MOUTH TWICE DAILY WITH A MEAL  . Multiple Vitamins-Minerals (CENTRUM PO) Take 1 tablet by mouth daily.   Glory Rosebush DELICA LANCETS 80D MISC Use to help check blood sugars twice a day Dx E11.9  . rosuvastatin (CRESTOR) 10 MG tablet Take 1 tablet (10 mg total) by mouth at bedtime.  [name] venlafaxine XR (EFFEXOR-XR) 75 MG 24 hr capsule TAKE 1 CAPSULE(75 MG) BY MOUTH DAILY  . [DISCONTINUED] glimepiride (AMARYL) 2 MG tablet TAKE 1 TABLET BY MOUTH DAILY BEFORE BREAKFAST  . [DISCONTINUED] benzonatate (TESSALON) 100 MG capsule Take 1 capsule (100 mg total) by mouth 3 (three) times daily as needed for cough.  . [DISCONTINUED] dextromethorphan-guaiFENesin (MUCINEX DM) 30-600 MG 12hr tablet Take 1 tablet by mouth 2 (two) times daily as needed for cough.  . [DISCONTINUED] empagliflozin (JARDIANCE) 10 MG TABS tablet Take 10 mg by mouth daily. (Patient not taking: Reported on 11/23/2016)  . [DISCONTINUED] ezetimibe (ZETIA) 10 MG tablet Take 1 tablet (10 mg total) by mouth at bedtime.  . [DISCONTINUED] losartan (COZAAR) 25 MG tablet Take 1 tablet (25 mg total) by mouth daily.  . [DISCONTINUED] metFORMIN (GLUCOPHAGE) 500 MG tablet TAKE 1 TABLET BY MOUTH TWICE DAILY WITH A MEAL  . [DISCONTINUED] rosuvastatin (CRESTOR) 10 MG tablet Take 1 tablet (10 mg total) by mouth at bedtime.   No facility-administered encounter medications on file as of 03/29/2017.     Activities of Daily Living In your present state of health, do you have any  difficulty performing the following activities: 03/29/2017  Hearing? N  Vision? N  Difficulty concentrating or making decisions? N  Walking or climbing stairs? N  Dressing or bathing? N  Doing errands, shopping? N  Preparing Food and eating ? N  Using the Toilet? N  In the past six months, have you accidently leaked urine? N  Do you have problems with loss of bowel control? N  Managing your Medications? N  Managing your Finances? N  Housekeeping or managing your Housekeeping? N  Some recent data might be hidden    Patient Care Team: Binnie Rail, MD as PCP - General (Internal Medicine)   Assessment:   This is a routine wellness examination for Bryce Maldonado. Physical assessment deferred to PCP.   Exercise Activities and Dietary recommendations Current Exercise Habits: Home exercise routine, Type of exercise: treadmill;walking, Time (Minutes): 45, Frequency (Times/Week): 5, Weekly Exercise (Minutes/Week): 225, Intensity: Mild, Exercise limited by: None identified Diet (meal preparation, eat out, water intake, caffeinated beverages, dairy products, fruits and vegetables): in general, a "healthy" diet  , well balanced   Reviewed heart healthy and diabetic diet, encouraged patient to increase daily water intake.  Goals    . Patient Stated     I would like to start to go to the gym again. I will make myself go to the gym after breakfast starting on a Monday to get back in the habit of doing it routinely.       Fall Risk Fall Risk  09/27/2016 09/27/2015 07/26/2012  Falls in the past year? No No No    Depression Screen PHQ 2/9 Scores 03/29/2017 09/27/2016 09/27/2015 07/26/2012  PHQ - 2 Score 0 0 0 0  PHQ- 9 Score 0 - - -    Cognitive Function       Ad8 score reviewed for issues:  Issues  making decisions: no  Less interest in hobbies / activities: no  Repeats questions, stories (family complaining): no  Trouble using ordinary gadgets (microwave, computer, phone):no  Forgets  the month or year: no  Mismanaging finances: no  Remembering appts: no  Daily problems with thinking and/or memory: no Ad8 score is= 0  Immunization History  Administered Date(s) Administered  . Influenza Whole 11/11/2007, 11/03/2008  . Influenza, High Dose Seasonal PF 12/04/2013, 09/27/2015  . Influenza-Unspecified 11/08/2012, 11/03/2014, 10/15/2015  . Pneumococcal Conjugate-13 11/03/2014  . Pneumococcal Polysaccharide-23 04/09/2013  . Tdap 08/13/2014  . Zoster 06/24/2010   Screening Tests Health Maintenance  Topic Date Due  . INFLUENZA VACCINE  04/30/2017 (Originally 08/30/2016)  . HEMOGLOBIN A1C  06/18/2017  . OPHTHALMOLOGY EXAM  08/07/2017  . FOOT EXAM  09/27/2017  . COLONOSCOPY  06/26/2022  . TETANUS/TDAP  08/12/2024  . PNA vac Low Risk Adult  Completed      Plan:    Continue doing brain stimulating activities (puzzles, reading, adult coloring books, staying active) to keep memory sharp.   Continue to eat heart healthy diet (full of fruits, vegetables, whole grains, lean protein, water--limit salt, fat, and sugar intake) and increase physical activity as tolerated.  I have personally reviewed and noted the following in the patient's chart:   . Medical and social history . Use of alcohol, tobacco or illicit drugs  . Current medications and supplements . Functional ability and status . Nutritional status . Physical activity . Advanced directives . List of other physicians . Vitals . Screenings to include cognitive, depression, and falls . Referrals and appointments  In addition, I have reviewed and discussed with patient certain preventive protocols, quality metrics, and best practice recommendations. A written personalized care plan for preventive services as well as general preventive health recommendations were provided to patient.     Michiel Cowboy, RN  03/29/2017   Medical screening examination/treatment/procedure(s) were performed by non-physician  practitioner and as supervising physician I was immediately available for consultation/collaboration. I agree with above. Binnie Rail, MD

## 2017-03-29 ENCOUNTER — Ambulatory Visit: Payer: Medicare Other | Admitting: Internal Medicine

## 2017-03-29 ENCOUNTER — Ambulatory Visit: Payer: Medicare Other | Admitting: Cardiology

## 2017-03-29 ENCOUNTER — Encounter: Payer: Self-pay | Admitting: Cardiology

## 2017-03-29 ENCOUNTER — Encounter: Payer: Self-pay | Admitting: Internal Medicine

## 2017-03-29 ENCOUNTER — Ambulatory Visit (INDEPENDENT_AMBULATORY_CARE_PROVIDER_SITE_OTHER): Payer: Medicare Other | Admitting: *Deleted

## 2017-03-29 VITALS — BP 142/70 | HR 86 | Ht 72.0 in | Wt 215.5 lb

## 2017-03-29 VITALS — BP 138/72 | HR 87 | Temp 97.5°F | Resp 18 | Ht 72.0 in | Wt 214.0 lb

## 2017-03-29 VITALS — BP 138/72 | HR 87 | Temp 97.5°F | Resp 16 | Wt 214.0 lb

## 2017-03-29 DIAGNOSIS — IMO0002 Reserved for concepts with insufficient information to code with codable children: Secondary | ICD-10-CM

## 2017-03-29 DIAGNOSIS — I25810 Atherosclerosis of coronary artery bypass graft(s) without angina pectoris: Secondary | ICD-10-CM | POA: Diagnosis not present

## 2017-03-29 DIAGNOSIS — F419 Anxiety disorder, unspecified: Secondary | ICD-10-CM | POA: Diagnosis not present

## 2017-03-29 DIAGNOSIS — E1351 Other specified diabetes mellitus with diabetic peripheral angiopathy without gangrene: Secondary | ICD-10-CM

## 2017-03-29 DIAGNOSIS — E1365 Other specified diabetes mellitus with hyperglycemia: Secondary | ICD-10-CM

## 2017-03-29 DIAGNOSIS — E782 Mixed hyperlipidemia: Secondary | ICD-10-CM

## 2017-03-29 DIAGNOSIS — I10 Essential (primary) hypertension: Secondary | ICD-10-CM

## 2017-03-29 DIAGNOSIS — Z0000 Encounter for general adult medical examination without abnormal findings: Secondary | ICD-10-CM | POA: Diagnosis not present

## 2017-03-29 DIAGNOSIS — Z8739 Personal history of other diseases of the musculoskeletal system and connective tissue: Secondary | ICD-10-CM

## 2017-03-29 LAB — POCT GLYCOSYLATED HEMOGLOBIN (HGB A1C): HEMOGLOBIN A1C: 6.7

## 2017-03-29 NOTE — Patient Instructions (Addendum)
Continue your current therapy  I will see you in one year   

## 2017-03-29 NOTE — Assessment & Plan Note (Signed)
Check lipid panel  Continue daily statin Regular exercise and healthy diet encouraged  

## 2017-03-29 NOTE — Assessment & Plan Note (Signed)
Taking allopurinol 300 mg daily Has not had gout symptoms since starting this medication Continue current dose

## 2017-03-29 NOTE — Assessment & Plan Note (Signed)
No chest pain, palpitations or shortness of breath Taking aspirin, Jardiance and statin Continue current medications

## 2017-03-29 NOTE — Patient Instructions (Addendum)
Continue doing brain stimulating activities (puzzles, reading, adult coloring books, staying active) to keep memory sharp.   Continue to eat heart healthy diet (full of fruits, vegetables, whole grains, lean protein, water--limit salt, fat, and sugar intake) and increase physical activity as tolerated.   Bryce Maldonado , Thank you for taking time to come for your Medicare Wellness Visit. I appreciate your ongoing commitment to your health goals. Please review the following plan we discussed and let me know if I can assist you in the future.   These are the goals we discussed: Goals    . Patient Stated     I would like to start to go to the gym again. I will make myself go to the gym after breakfast starting on a Monday to get back in the habit of doing it routinely.       This is a list of the screening recommended for you and due dates:  Health Maintenance  Topic Date Due  . Flu Shot  04/30/2017*  . Hemoglobin A1C  06/18/2017  . Eye exam for diabetics  08/07/2017  . Complete foot exam   09/27/2017  . Colon Cancer Screening  06/26/2022  . Tetanus Vaccine  08/12/2024  . Pneumonia vaccines  Completed  *Topic was postponed. The date shown is not the original due date.     Carbohydrate Counting for Diabetes Mellitus, Adult Carbohydrate counting is a method for keeping track of how many carbohydrates you eat. Eating carbohydrates naturally increases the amount of sugar (glucose) in the blood. Counting how many carbohydrates you eat helps keep your blood glucose within normal limits, which helps you manage your diabetes (diabetes mellitus). It is important to know how many carbohydrates you can safely have in each meal. This is different for every person. A diet and nutrition specialist (registered dietitian) can help you make a meal plan and calculate how many carbohydrates you should have at each meal and snack. Carbohydrates are found in the following foods:  Grains, such as breads and  cereals.  Dried beans and soy products.  Starchy vegetables, such as potatoes, peas, and corn.  Fruit and fruit juices.  Milk and yogurt.  Sweets and snack foods, such as cake, cookies, candy, chips, and soft drinks.  How do I count carbohydrates? There are two ways to count carbohydrates in food. You can use either of the methods or a combination of both. Reading "Nutrition Facts" on packaged food The "Nutrition Facts" list is included on the labels of almost all packaged foods and beverages in the [place] It includes:  The serving size.  Information about nutrients in each serving, including the grams (g) of carbohydrate per serving.  To use the "Nutrition Facts":  Decide how many servings you will have.  Multiply the number of servings by the number of carbohydrates per serving.  The resulting number is the total amount of carbohydrates that you will be having.  Learning standard serving sizes of other foods When you eat foods containing carbohydrates that are not packaged or do not include "Nutrition Facts" on the label, you need to measure the servings in order to count the amount of carbohydrates:  Measure the foods that you will eat with a food scale or measuring cup, if needed.  Decide how many standard-size servings you will eat.  Multiply the number of servings by 15. Most carbohydrate-rich foods have about 15 g of carbohydrates per serving. ? For example, if you eat 8 oz (170 g) of  strawberries, you will have eaten 2 servings and 30 g of carbohydrates (2 servings x 15 g = 30 g).  For foods that have more than one food mixed, such as soups and casseroles, you must count the carbohydrates in each food that is included.  The following list contains standard serving sizes of common carbohydrate-rich foods. Each of these servings has about 15 g of carbohydrates:   hamburger bun or  English muffin.   oz (15 mL) syrup.   oz (14 g) jelly.  1 slice of bread.  1  six-inch tortilla.  3 oz (85 g) cooked rice or pasta.  4 oz (113 g) cooked dried beans.  4 oz (113 g) starchy vegetable, such as peas, corn, or potatoes.  4 oz (113 g) hot cereal.  4 oz (113 g) mashed potatoes or  of a large baked potato.  4 oz (113 g) canned or frozen fruit.  4 oz (120 mL) fruit juice.  4-6 crackers.  6 chicken nuggets.  6 oz (170 g) unsweetened dry cereal.  6 oz (170 g) plain fat-free yogurt or yogurt sweetened with artificial sweeteners.  8 oz (240 mL) milk.  8 oz (170 g) fresh fruit or one small piece of fruit.  24 oz (680 g) popped popcorn.  Example of carbohydrate counting Sample meal  3 oz (85 g) chicken breast.  6 oz (170 g) brown rice.  4 oz (113 g) corn.  8 oz (240 mL) milk.  8 oz (170 g) strawberries with sugar-free whipped topping. Carbohydrate calculation 1. Identify the foods that contain carbohydrates: ? Rice. ? Corn. ? Milk. ? Strawberries. 2. Calculate how many servings you have of each food: ? 2 servings rice. ? 1 serving corn. ? 1 serving milk. ? 1 serving strawberries. 3. Multiply each number of servings by 15 g: ? 2 servings rice x 15 g = 30 g. ? 1 serving corn x 15 g = 15 g. ? 1 serving milk x 15 g = 15 g. ? 1 serving strawberries x 15 g = 15 g. 4. Add together all of the amounts to find the total grams of carbohydrates eaten: ? 30 g + 15 g + 15 g + 15 g = 75 g of carbohydrates total. This information is not intended to replace advice given to you by your health care provider. Make sure you discuss any questions you have with your health care provider. Document Released: 01/16/2005 Document Revised: 08/06/2015 Document Reviewed: 06/30/2015 Elsevier Interactive Patient Education  [employer].

## 2017-03-29 NOTE — Assessment & Plan Note (Signed)
Controlled, stable Continue current dose of medication  

## 2017-03-29 NOTE — Assessment & Plan Note (Addendum)
A1c 6.7% He would like to ideally get off of the Amaryl Will d/c amaryl Continue metformin at current dose - does not tolerate a high dose Continue jardiance 10 mg - if sugars increase will increase dose to 25 mg-suspect that most likely will need to increase this medication Continue regular exercise Continue to work on improving diet

## 2017-03-29 NOTE — Assessment & Plan Note (Signed)
BP well controlled Current regimen effective and well tolerated Continue current medications at current doses cmp  

## 2017-04-28 ENCOUNTER — Other Ambulatory Visit: Payer: Self-pay | Admitting: Internal Medicine

## 2017-04-29 ENCOUNTER — Other Ambulatory Visit: Payer: Self-pay | Admitting: Internal Medicine

## 2017-06-15 ENCOUNTER — Other Ambulatory Visit: Payer: Self-pay | Admitting: Internal Medicine

## 2017-09-13 ENCOUNTER — Other Ambulatory Visit: Payer: Self-pay | Admitting: Internal Medicine

## 2017-09-30 NOTE — Progress Notes (Signed)
Subjective:    Patient ID: Bryce Maldonado, male    DOB: December 15, 1940, 77 y.o.   MRN: [id number]  HPI He is here for a physical exam.   He has lost some weight.  He has been eating less and thinks the jardiance has helped.  He is walking about 5000 steps a day.    He has itching in his scalp only.  He denies any dandruff or redness.  He has tried otc shampoos w/o improvement.   Medications and allergies reviewed with patient and updated if appropriate.  Patient Active Problem List   Diagnosis Date Noted  . Hypertension 09/27/2016  . Anxiety 03/27/2016  . SVT (supraventricular tachycardia) ([place]) 12/04/2014  . Typical atrial flutter ([place]) 11/03/2014  . PAF (paroxysmal atrial fibrillation) ([place]) 08/13/2014  . DM (diabetes mellitus), secondary, uncontrolled, with peripheral vascular complications ([place]) 35/70/1779  . History of gout 03/04/2009  . ATOPIC RHINITIS 03/04/2009  . PROSTATE CANCER, HX OF 03/04/2009  . Mixed hyperlipidemia 08/06/2006  . Coronary atherosclerosis 08/06/2006    Current Outpatient Medications on File Prior to Visit  Medication Sig Dispense Refill  . allopurinol (ZYLOPRIM) 300 MG tablet TAKE 1 TABLET BY MOUTH EVERY DAY 90 tablet 1  . aspirin EC 81 MG tablet Take 81 mg by mouth daily.    [name] ezetimibe (ZETIA) 10 MG tablet Take 1 tablet (10 mg total) by mouth at bedtime. 90 tablet 2  . fexofenadine (ALLEGRA) 180 MG tablet Take 180 mg by mouth every morning.     [name] glucose blood (ONETOUCH VERIO) test strip 1 each by Other route 2 (two) times daily. Use to check  Blood sugars twice a day Dx E11.9 100 each 3  . JARDIANCE 10 MG TABS tablet TAKE 1 TABLET BY MOUTH DAILY 90 tablet 1  . losartan (COZAAR) 25 MG tablet TAKE 1 TABLET(25 MG) BY MOUTH DAILY 90 tablet 1  . Melatonin 5 MG TABS Take 5 mg by mouth at bedtime.    . metFORMIN (GLUCOPHAGE) 500 MG tablet TAKE 1 TABLET BY MOUTH TWICE DAILY WITH A MEAL 180 tablet 1  . Multiple Vitamins-Minerals (CENTRUM PO) Take 1  tablet by mouth daily.     Glory Rosebush DELICA LANCETS 39Q MISC Use to help check blood sugars twice a day Dx E11.9 100 each 3  . rosuvastatin (CRESTOR) 10 MG tablet Take 1 tablet (10 mg total) by mouth at bedtime. 90 tablet 2  . venlafaxine XR (EFFEXOR-XR) 75 MG 24 hr capsule TAKE 1 CAPSULE(75 MG) BY MOUTH DAILY 90 capsule 1   No current facility-administered medications on file prior to visit.     Past Medical History:  Diagnosis Date  . Anemia   . BPH (benign prostatic hyperplasia)    Elevated PSA  . Childhood asthma   . Coronary artery disease    post CABG x4 -- in 1995 -- due to three-vessel coronary artery disease  . Diverticular disease   . Dyslipidemia   . First degree AV block   . Gout   . Osteoarthritis    "everywhere" (12/04/2014)  . Prostate cancer [facility]) 2012   Dr Karsten Ro  . Spinal stenosis 2004   L4 radiculopathy, herniated nucleus pulposus L4-5  . Tinnitus   . Type II diabetes mellitus ([place])   . Typical atrial flutter [facility])    s/p ablation    Past Surgical History:  Procedure Laterality Date  . APPENDECTOMY  1943  . ATRIAL FLUTTER ABLATION  12/04/2014  . BACK  SURGERY    . CARDIAC CATHETERIZATION  02/10/2010   Est. EF of 60-65% -- Severe three-vessel obstructive atherosclerotic coronary artery disease -- All grafts were patent including left internal mammary artery graft to left anterior descending coronary artery, saphenous vein graft to the diagonal, saphenous vein graft to obtuse marginal vessel, and saphenous vein graft to the posterior descending coronary artery -- Normal left ventricular function   . COLONOSCOPY     negative X3; Dr Carlean Purl  . CORONARY ARTERY BYPASS GRAFT  10/21/1993   x4 -- using the LIM artery graft to the LAD artery, with saphenous vein grafts to the diagonal branch of the LAD, OM branch the left circumflex coronary artery, and the posterior descending branch of the RCA -- Est. EF of 65%-- Surgeon: Gaye Pollack, M.D.              .  ELECTROPHYSIOLOGIC STUDY N/A 10/01/2014   CTI ablation by Dr Curt Bears  . ELECTROPHYSIOLOGIC STUDY N/A 12/04/2014   repeat CTI ablation by Dr Rayann Heman  . FLEXIBLE SIGMOIDOSCOPY  2005    external hemorrhoids  . JOINT REPLACEMENT    . MICRODISCECTOMY LUMBAR  2004   L4-5, Dr. Arnoldo Morale  . PROSTATE BIOPSY  10/2008   "had radiation tx for prostate cancer"  . SALIVARY STONE REMOVAL  2001  . TOTAL HIP ARTHROPLASTY Right November 2010  . TOTAL HIP ARTHROPLASTY Left 03/2010   Dr Alvan Dame    Social History   Socioeconomic History  . Marital status: Married    Spouse name: Not on file  . Number of children: Not on file  . Years of education: Not on file  . Highest education level: Not on file  Occupational History  . Not on file  Social Needs  . Financial resource strain: Not hard at all  . Food insecurity:    Worry: Never true    Inability: Never true  . Transportation needs:    Medical: No    Non-medical: No  Tobacco Use  . Smoking status: Former Smoker    Packs/day: 2.50    Years: 15.00    Pack years: 37.50    Types: Cigarettes    Last attempt to quit: 01/30/1970    Years since quitting: 47.7  . Smokeless tobacco: Never Used  . Tobacco comment: smoked 1959-1972  Substance and Sexual Activity  . Alcohol use: Yes    Alcohol/week: 4.0 standard drinks    Types: 4 Standard drinks or equivalent per week    Comment: 11/42016 "nothing to drink since 07/25/2014"  . Drug use: No  . Sexual activity: Not Currently  Lifestyle  . Physical activity:    Days per week: 4 days    Minutes per session: 60 min  . Stress: Not at all  Relationships  . Social connections:    Talks on phone: More than three times a week    Gets together: More than three times a week    Attends religious service: Not on file    Active member of club or organization: Not on file    Attends meetings of clubs or organizations: Not on file    Relationship status: Married  Other Topics Concern  . Not on file  Social  History Narrative   Pt lives in [place] with spouse. 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  Enjoys reading and walking.    Family History  Problem Relation Age of Onset  . Diabetes Father   . Hypertension Father   . Lung cancer  Father        smoker  . Heart failure Mother 82  . Hyperlipidemia Sister   . Coronary artery disease Sister   . Heart attack Paternal Aunt        X 2; both > 65  . Colon cancer Neg Hx   . Stroke Neg Hx     Review of Systems  Constitutional: Negative for chills and fever.  Eyes: Negative for visual disturbance.  Respiratory: Negative for cough, shortness of breath and wheezing.   Cardiovascular: Negative for chest pain, palpitations and leg swelling.  Gastrointestinal: Negative for abdominal pain, blood in stool, constipation, diarrhea and nausea.       No gerd  Genitourinary: Negative for difficulty urinating, dysuria and hematuria.  Musculoskeletal: Positive for arthralgias. Negative for back pain.  Skin: Negative for color change and rash.       Itching on scalp  Neurological: Negative for light-headedness and headaches.  Psychiatric/Behavioral: Negative for dysphoric mood. The patient is not nervous/anxious.        Objective:   Vitals:   10/02/17 0849  BP: 138/70  Pulse: 68  Resp: 16  Temp: (!) 97.5 F (36.4 C)  SpO2: 97%   Filed Weights   10/02/17 0849  Weight: 207 lb 12.8 oz (94.3 kg)   Body mass index is 28.18 kg/m.  Wt Readings from Last 3 Encounters:  10/02/17 207 lb 12.8 oz (94.3 kg)  03/29/17 215 lb 8 oz (97.8 kg)  03/29/17 214 lb (97.1 kg)     Physical Exam Constitutional: He appears well-developed and well-nourished. No distress.  HENT:  Head: Normocephalic and atraumatic.  Right Ear: External ear normal.  Left Ear: External ear normal.  Mouth/Throat: Oropharynx is clear and moist.  Normal ear canals and TM b/l  Eyes: Conjunctivae and EOM are normal.  Neck: Neck supple. No tracheal deviation present. No  thyromegaly present.  No carotid bruit  Cardiovascular: Normal rate, regular rhythm, normal heart sounds and intact distal pulses.  2/6 murmur heard. Pulmonary/Chest: Effort normal and breath sounds normal. No respiratory distress. He has no wheezes. He has no rales.  Abdominal: Soft. He exhibits no distension. There is no tenderness.  Genitourinary: deferred  Musculoskeletal: He exhibits no edema.  Lymphadenopathy:   He has no cervical adenopathy.  Skin: Skin is warm and dry. He is not diaphoretic.  Scalp normal appearing w/o redness, dryness Psychiatric: He has a normal mood and affect. His behavior is normal.    Diabetic Foot Exam - Simple   Simple Foot Form Diabetic Foot exam was performed with the following findings:  Yes 10/02/2017  9:37 AM  Visual Inspection No deformities, no ulcerations, no other skin breakdown bilaterally:  Yes Sensation Testing Intact to touch and monofilament testing bilaterally:  Yes Pulse Check Posterior Tibialis and Dorsalis pulse intact bilaterally:  Yes Comments         Assessment & Plan:   Physical exam: Screening blood work  ordered Immunizations   Flu vaccine due, discussed shingrix Colonoscopy   Up to date  Eye exams  Not up to date - schedule for November 4th EKG   Done by cardio Exercise  Walking regularly - 5000 steps Weight  Has lost weight Skin  Head itches, sweating make it worse - no rash Substance abuse  none   See Problem List for Assessment and Plan of chronic medical problems.

## 2017-09-30 NOTE — Patient Instructions (Addendum)
Test(s) ordered today. Your results will be released to [place] (or called to you) after review, usually within 72hours after test completion. If any changes need to be made, you will be notified at that same time.  All other Health Maintenance issues reviewed.   All recommended immunizations and age-appropriate screenings are up-to-date or discussed.  Flu immunization administered today.    Medications reviewed and updated.  Changes include trying a shampoo for your itchy scalp.  Your prescription(s) have been submitted to your pharmacy. Please take as directed and contact our office if you believe you are having problem(s) with the medication(s).   Please followup in 6 months      Health Maintenance, Male A healthy lifestyle and preventive care is important for your health and wellness. Ask your health care provider about what schedule of regular examinations is right for you. What should I know about weight and diet? Eat a Healthy Diet  Eat plenty of vegetables, fruits, whole grains, low-fat dairy products, and lean protein.  Do not eat a lot of foods high in solid fats, added sugars, or salt.  Maintain a Healthy Weight Regular exercise can help you achieve or maintain a healthy weight. You should:  Do at least 150 minutes of exercise each week. The exercise should increase your heart rate and make you sweat (moderate-intensity exercise).  Do strength-training exercises at least twice a week.  Watch Your Levels of Cholesterol and Blood Lipids  Have your blood tested for lipids and cholesterol every 5 years starting at 76 years of age. If you are at high risk for heart disease, you should start having your blood tested when you are 76 years old. You may need to have your cholesterol levels checked more often if: ? Your lipid or cholesterol levels are high. ? You are older than 76 years of age. ? You are at high risk for heart disease.  What should I know about cancer  screening? Many types of cancers can be detected early and may often be prevented. Lung Cancer  You should be screened every year for lung cancer if: ? You are a current smoker who has smoked for at least 30 years. ? You are a former smoker who has quit within the past 15 years.  Talk to your health care provider about your screening options, when you should start screening, and how often you should be screened.  Colorectal Cancer  Routine colorectal cancer screening usually begins at 76 years of age and should be repeated every 5-10 years until you are 76 years old. You may need to be screened more often if early forms of precancerous polyps or small growths are found. Your health care provider may recommend screening at an earlier age if you have risk factors for colon cancer.  Your health care provider may recommend using home test kits to check for hidden blood in the stool.  A small camera at the end of a tube can be used to examine your colon (sigmoidoscopy or colonoscopy). This checks for the earliest forms of colorectal cancer.  Prostate and Testicular Cancer  Depending on your age and overall health, your health care provider may do certain tests to screen for prostate and testicular cancer.  Talk to your health care provider about any symptoms or concerns you have about testicular or prostate cancer.  Skin Cancer  Check your skin from head to toe regularly.  Tell your health care provider about any new moles or changes in moles,  especially if: ? There is a change in a mole's size, shape, or color. ? You have a mole that is larger than a pencil eraser.  Always use sunscreen. Apply sunscreen liberally and repeat throughout the day.  Protect yourself by wearing long sleeves, pants, a wide-brimmed hat, and sunglasses when outside.  What should I know about heart disease, diabetes, and high blood pressure?  If you are 65-74 years of age, have your blood pressure checked  every 3-5 years. If you are 28 years of age or older, have your blood pressure checked every year. You should have your blood pressure measured twice-once when you are at a hospital or clinic, and once when you are not at a hospital or clinic. Record the average of the two measurements. To check your blood pressure when you are not at a hospital or clinic, you can use: ? An automated blood pressure machine at a pharmacy. ? A home blood pressure monitor.  Talk to your health care provider about your target blood pressure.  If you are between 66-72 years old, ask your health care provider if you should take aspirin to prevent heart disease.  Have regular diabetes screenings by checking your fasting blood sugar level. ? If you are at a normal weight and have a low risk for diabetes, have this test once every three years after the age of 70. ? If you are overweight and have a high risk for diabetes, consider being tested at a younger age or more often.  A one-time screening for abdominal aortic aneurysm (AAA) by ultrasound is recommended for men aged 57-75 years who are current or former smokers. What should I know about preventing infection? Hepatitis B If you have a higher risk for hepatitis B, you should be screened for this virus. Talk with your health care provider to find out if you are at risk for hepatitis B infection. Hepatitis C Blood testing is recommended for:  Everyone born from [birth year] through 1964.  Anyone with known risk factors for hepatitis C.  Sexually Transmitted Diseases (STDs)  You should be screened each year for STDs including gonorrhea and chlamydia if: ? You are sexually active and are younger than 76 years of age. ? You are older than 76 years of age and your health care provider tells you that you are at risk for this type of infection. ? Your sexual activity has changed since you were last screened and you are at an increased risk for chlamydia or gonorrhea. Ask your  health care provider if you are at risk.  Talk with your health care provider about whether you are at high risk of being infected with HIV. Your health care provider may recommend a prescription medicine to help prevent HIV infection.  What else can I do?  Schedule regular health, dental, and eye exams.  Stay current with your vaccines (immunizations).  Do not use any tobacco products, such as cigarettes, chewing tobacco, and e-cigarettes. If you need help quitting, ask your health care provider.  Limit alcohol intake to no more than 2 drinks per day. One drink equals 12 ounces of beer, 5 ounces of wine, or 1 ounces of hard liquor.  Do not use street drugs.  Do not share needles.  Ask your health care provider for help if you need support or information about quitting drugs.  Tell your health care provider if you often feel depressed.  Tell your health care provider if you have ever been abused or  do not feel safe at home. This information is not intended to replace advice given to you by your health care provider. Make sure you discuss any questions you have with your health care provider. Document Released: 07/15/2007 Document Revised: 09/15/2015 Document Reviewed: 10/20/2014 Elsevier Interactive Patient Education  [employer].

## 2017-10-02 ENCOUNTER — Encounter: Payer: Self-pay | Admitting: Internal Medicine

## 2017-10-02 ENCOUNTER — Ambulatory Visit (INDEPENDENT_AMBULATORY_CARE_PROVIDER_SITE_OTHER): Payer: Medicare Other | Admitting: Internal Medicine

## 2017-10-02 ENCOUNTER — Other Ambulatory Visit (INDEPENDENT_AMBULATORY_CARE_PROVIDER_SITE_OTHER): Payer: Medicare Other

## 2017-10-02 VITALS — BP 138/70 | HR 68 | Temp 97.5°F | Resp 16 | Ht 72.0 in | Wt 207.8 lb

## 2017-10-02 DIAGNOSIS — I25810 Atherosclerosis of coronary artery bypass graft(s) without angina pectoris: Secondary | ICD-10-CM

## 2017-10-02 DIAGNOSIS — E1365 Other specified diabetes mellitus with hyperglycemia: Secondary | ICD-10-CM | POA: Diagnosis not present

## 2017-10-02 DIAGNOSIS — IMO0002 Reserved for concepts with insufficient information to code with codable children: Secondary | ICD-10-CM

## 2017-10-02 DIAGNOSIS — E782 Mixed hyperlipidemia: Secondary | ICD-10-CM

## 2017-10-02 DIAGNOSIS — Z0001 Encounter for general adult medical examination with abnormal findings: Secondary | ICD-10-CM | POA: Diagnosis not present

## 2017-10-02 DIAGNOSIS — Z8739 Personal history of other diseases of the musculoskeletal system and connective tissue: Secondary | ICD-10-CM | POA: Diagnosis not present

## 2017-10-02 DIAGNOSIS — I10 Essential (primary) hypertension: Secondary | ICD-10-CM

## 2017-10-02 DIAGNOSIS — E1351 Other specified diabetes mellitus with diabetic peripheral angiopathy without gangrene: Secondary | ICD-10-CM

## 2017-10-02 DIAGNOSIS — Z0000 Encounter for general adult medical examination without abnormal findings: Secondary | ICD-10-CM | POA: Diagnosis not present

## 2017-10-02 DIAGNOSIS — Z23 Encounter for immunization: Secondary | ICD-10-CM | POA: Diagnosis not present

## 2017-10-02 DIAGNOSIS — I480 Paroxysmal atrial fibrillation: Secondary | ICD-10-CM

## 2017-10-02 DIAGNOSIS — F419 Anxiety disorder, unspecified: Secondary | ICD-10-CM

## 2017-10-02 DIAGNOSIS — I350 Nonrheumatic aortic (valve) stenosis: Secondary | ICD-10-CM

## 2017-10-02 LAB — CBC WITH DIFFERENTIAL/PLATELET
BASOS ABS: 0.1 10*3/uL (ref 0.0–0.1)
Basophils Relative: 1.7 % (ref 0.0–3.0)
Eosinophils Absolute: 0.2 10*3/uL (ref 0.0–0.7)
Eosinophils Relative: 3.7 % (ref 0.0–5.0)
HEMATOCRIT: 47.3 % (ref 39.0–52.0)
Hemoglobin: 16.2 g/dL (ref 13.0–17.0)
LYMPHS ABS: 1.6 10*3/uL (ref 0.7–4.0)
LYMPHS PCT: 25.8 % (ref 12.0–46.0)
MCHC: 34.2 g/dL (ref 30.0–36.0)
MCV: 93 fl (ref 78.0–100.0)
MONOS PCT: 12.4 % — AB (ref 3.0–12.0)
Monocytes Absolute: 0.8 10*3/uL (ref 0.1–1.0)
NEUTROS ABS: 3.4 10*3/uL (ref 1.4–7.7)
NEUTROS PCT: 56.4 % (ref 43.0–77.0)
PLATELETS: 220 10*3/uL (ref 150.0–400.0)
RBC: 5.09 Mil/uL (ref 4.22–5.81)
RDW: 13.7 % (ref 11.5–15.5)
WBC: 6.1 10*3/uL (ref 4.0–10.5)

## 2017-10-02 LAB — TSH: TSH: 1.31 u[IU]/mL (ref 0.35–4.50)

## 2017-10-02 LAB — COMPREHENSIVE METABOLIC PANEL
ALBUMIN: 4.9 g/dL (ref 3.5–5.2)
ALT: 16 U/L (ref 0–53)
AST: 14 U/L (ref 0–37)
Alkaline Phosphatase: 52 U/L (ref 39–117)
BILIRUBIN TOTAL: 0.7 mg/dL (ref 0.2–1.2)
BUN: 22 mg/dL (ref 6–23)
CO2: 24 mEq/L (ref 19–32)
Calcium: 10.3 mg/dL (ref 8.4–10.5)
Chloride: 101 mEq/L (ref 96–112)
Creatinine, Ser: 0.72 mg/dL (ref 0.40–1.50)
GFR: 112.81 mL/min (ref >60.00)
Glucose, Bld: 156 mg/dL — ABNORMAL HIGH (ref 70–99)
Potassium: 4.1 mEq/L (ref 3.5–5.1)
Sodium: 138 mEq/L (ref 135–145)
Total Protein: 8 g/dL (ref 6.0–8.3)

## 2017-10-02 LAB — LIPID PANEL
CHOL/HDL RATIO: 3
Cholesterol: 142 mg/dL (ref 0–200)
HDL: 49.7 mg/dL (ref >39.00)
LDL Cholesterol: 66 mg/dL (ref 0–99)
NONHDL: 92.03
TRIGLYCERIDES: 131 mg/dL (ref 0.0–149.0)
VLDL: 26.2 mg/dL (ref 0.0–40.0)

## 2017-10-02 LAB — URIC ACID: URIC ACID, SERUM: 3.2 mg/dL — AB (ref 4.0–7.8)

## 2017-10-02 LAB — HEMOGLOBIN A1C: Hgb A1c MFr Bld: 7.1 % — ABNORMAL HIGH (ref 4.6–6.5)

## 2017-10-02 MED ORDER — KETOCONAZOLE 2 % EX SHAM: 1.0000 "application " | MEDICATED_SHAMPOO | CUTANEOUS | 0 refills | Status: DC

## 2017-10-02 NOTE — Assessment & Plan Note (Signed)
Check lipid panel  Continue daily statin Regular exercise and healthy diet encouraged  

## 2017-10-02 NOTE — Assessment & Plan Note (Signed)
BP well controlled Current regimen effective and well tolerated Continue current medications at current doses cmp  

## 2017-10-02 NOTE — Assessment & Plan Note (Signed)
Mild murmur on exam Follows with cardiology

## 2017-10-02 NOTE — Assessment & Plan Note (Signed)
Controlled, stable Continue current dose of medication - effexor

## 2017-10-02 NOTE — Assessment & Plan Note (Signed)
No gout symptoms Continue allopurinol Check uric acid level

## 2017-10-02 NOTE — Assessment & Plan Note (Addendum)
PAfib On ASA Rate today 68 No symptoms Following with cardiology Cbc, tsh, cmp

## 2017-10-02 NOTE — Assessment & Plan Note (Signed)
Check a1c Continue regular walking Continue weight loss efforts Continue metformin, jardiance

## 2017-10-02 NOTE — Assessment & Plan Note (Signed)
Follows with cardiology No concerning symptoms Taking ASA, crestor, jardiance Cbc, cmp, lipids

## 2017-10-03 ENCOUNTER — Encounter: Payer: Self-pay | Admitting: Internal Medicine

## 2017-12-03 LAB — HM DIABETES EYE EXAM

## 2017-12-12 ENCOUNTER — Other Ambulatory Visit: Payer: Self-pay | Admitting: Internal Medicine

## 2017-12-13 ENCOUNTER — Encounter: Payer: Self-pay | Admitting: Internal Medicine

## 2017-12-13 ENCOUNTER — Other Ambulatory Visit: Payer: Self-pay | Admitting: *Deleted

## 2017-12-13 MED ORDER — ROSUVASTATIN CALCIUM 10 MG PO TABS: 10.0000 mg | ORAL_TABLET | Freq: Every day | ORAL | 0 refills | Status: DC

## 2017-12-13 MED ORDER — EZETIMIBE 10 MG PO TABS: 10.0000 mg | ORAL_TABLET | Freq: Every day | ORAL | 0 refills | Status: DC

## 2018-03-18 ENCOUNTER — Other Ambulatory Visit: Payer: Self-pay | Admitting: Internal Medicine

## 2018-03-18 ENCOUNTER — Other Ambulatory Visit: Payer: Self-pay

## 2018-03-18 MED ORDER — EZETIMIBE 10 MG PO TABS: 10.0000 mg | ORAL_TABLET | Freq: Every day | ORAL | 0 refills | Status: DC

## 2018-03-18 MED ORDER — ROSUVASTATIN CALCIUM 10 MG PO TABS: 10.0000 mg | ORAL_TABLET | Freq: Every day | ORAL | 0 refills | Status: DC

## 2018-03-18 NOTE — Addendum Note (Signed)
Addended by: Diana Eves on: 03/18/2018 03:30 PM   Modules accepted: Orders

## 2018-03-18 NOTE — Telephone Encounter (Signed)
Rx(s) sent to pharmacy electronically.  

## 2018-04-02 NOTE — Patient Instructions (Addendum)
  Tests ordered today. Your results will be released to [place] (or called to you) after review, usually within 72hours after test completion. If any changes need to be made, you will be notified at that same time.   Medications reviewed and updated.  Changes include :  Start gabapentin at bedtime.     Your prescription(s) have been submitted to your pharmacy. Please take as directed and contact our office if you believe you are having problem(s) with the medication(s).   Please followup in 6 months

## 2018-04-02 NOTE — Progress Notes (Signed)
Subjective:    Patient ID: Bryce Maldonado, male    DOB: June 30, 1940, 78 y.o.   MRN: [id number]  HPI The patient is here for follow up.  Diabetes: He is taking his medication daily as prescribed. He is fairly compliant with a diabetic diet. He is exercising regularly - walks 5000 steps daily.  He checks his feet daily and denies foot lesions-he does have chronic feet pain. He is up-to-date with an ophthalmology examination.   CAD, Hypertension: He is taking his medication daily. He is compliant with a low sodium diet.  He denies chest pain, palpitations, edema, shortness of breath and regular headaches. He is exercising regularly.      Hyperlipidemia: He is taking his medication daily. He is compliant with a low fat/cholesterol diet. He is exercising regularly. He denies myalgias.   Gout:  He takes allopurinol daily.  He denies gout symptoms since he was here last.   Anxiety: He is taking his medication daily as prescribed. He denies any side effects from the medication. He feels his anxiety is well controlled and he is happy with his current dose of medication.   Feet pain:  His whole feet hurt.  He denies numbness.  He has intermittent tingling and sharp pain.  He also has joint pain.  His wife has noticed that his balance is off a little.  He has constant pain.    Medications and allergies reviewed with patient and updated if appropriate.  Patient Active Problem List   Diagnosis Date Noted  . Neuropathy 04/03/2018  . Mild aortic stenosis 10/02/2017  . Hypertension 09/27/2016  . Anxiety 03/27/2016  . SVT (supraventricular tachycardia) ([place]) 12/04/2014  . Typical atrial flutter ([place]) 11/03/2014  . PAF (paroxysmal atrial fibrillation) ([place]) 08/13/2014  . DM (diabetes mellitus), secondary, uncontrolled, with peripheral vascular complications ([place]) 38/18/2993  . History of gout 03/04/2009  . ATOPIC RHINITIS 03/04/2009  . PROSTATE CANCER, HX OF 03/04/2009  . Mixed hyperlipidemia  08/06/2006  . Coronary atherosclerosis 08/06/2006    Current Outpatient Medications on File Prior to Visit  Medication Sig Dispense Refill  . allopurinol (ZYLOPRIM) 300 MG tablet TAKE 1 TABLET BY MOUTH EVERY DAY 90 tablet 1  . aspirin EC 81 MG tablet Take 81 mg by mouth daily.    [name] ezetimibe (ZETIA) 10 MG tablet Take 1 tablet (10 mg total) by mouth at bedtime. NEEDS APPOINTMENT FOR FUTURE REFILLS 90 tablet 0  . fexofenadine (ALLEGRA) 180 MG tablet Take 180 mg by mouth every morning.     [name] glucose blood (ONETOUCH VERIO) test strip 1 each by Other route 2 (two) times daily. Use to check  Blood sugars twice a day Dx E11.9 100 each 3  . JARDIANCE 10 MG TABS tablet TAKE 1 TABLET BY MOUTH DAILY 90 tablet 1  . ketoconazole (NIZORAL) 2 % shampoo Apply 1 application topically 2 (two) times a week. Use for 8 weeks then use prn 120 mL 0  . losartan (COZAAR) 25 MG tablet TAKE 1 TABLET(25 MG) BY MOUTH DAILY 90 tablet 1  . Melatonin 5 MG TABS Take 5 mg by mouth at bedtime.    . metFORMIN (GLUCOPHAGE) 500 MG tablet TAKE 1 TABLET BY MOUTH TWICE DAILY WITH A MEAL 180 tablet 1  . Multiple Vitamins-Minerals (CENTRUM PO) Take 1 tablet by mouth daily.     Glory Rosebush DELICA LANCETS 71I MISC Use to help check blood sugars twice a day Dx E11.9 100 each 3  . rosuvastatin (  CRESTOR) 10 MG tablet Take 1 tablet (10 mg total) by mouth at bedtime. NEEDS APPOINTMENT FOR FUTURE REFILLS 90 tablet 0  . venlafaxine XR (EFFEXOR-XR) 75 MG 24 hr capsule TAKE 1 CAPSULE(75 MG) BY MOUTH DAILY 90 capsule 1   No current facility-administered medications on file prior to visit.     Past Medical History:  Diagnosis Date  . Anemia   . BPH (benign prostatic hyperplasia)    Elevated PSA  . Childhood asthma   . Coronary artery disease    post CABG x4 -- in 1995 -- due to three-vessel coronary artery disease  . Diverticular disease   . Dyslipidemia   . First degree AV block   . Gout   . Osteoarthritis    "everywhere"  (12/04/2014)  . Prostate cancer [facility]) 2012   Dr Karsten Ro  . Spinal stenosis 2004   L4 radiculopathy, herniated nucleus pulposus L4-5  . Tinnitus   . Type II diabetes mellitus ([place])   . Typical atrial flutter [facility])    s/p ablation    Past Surgical History:  Procedure Laterality Date  . APPENDECTOMY  1943  . ATRIAL FLUTTER ABLATION  12/04/2014  . BACK SURGERY    . CARDIAC CATHETERIZATION  02/10/2010   Est. EF of 60-65% -- Severe three-vessel obstructive atherosclerotic coronary artery disease -- All grafts were patent including left internal mammary artery graft to left anterior descending coronary artery, saphenous vein graft to the diagonal, saphenous vein graft to obtuse marginal vessel, and saphenous vein graft to the posterior descending coronary artery -- Normal left ventricular function   . COLONOSCOPY     negative X3; Dr Carlean Purl  . CORONARY ARTERY BYPASS GRAFT  10/21/1993   x4 -- using the LIM artery graft to the LAD artery, with saphenous vein grafts to the diagonal branch of the LAD, OM branch the left circumflex coronary artery, and the posterior descending branch of the RCA -- Est. EF of 65%-- Surgeon: Gaye Pollack, M.D.              . ELECTROPHYSIOLOGIC STUDY N/A 10/01/2014   CTI ablation by Dr Curt Bears  . ELECTROPHYSIOLOGIC STUDY N/A 12/04/2014   repeat CTI ablation by Dr Rayann Heman  . FLEXIBLE SIGMOIDOSCOPY  2005    external hemorrhoids  . JOINT REPLACEMENT    . MICRODISCECTOMY LUMBAR  2004   L4-5, Dr. Arnoldo Morale  . PROSTATE BIOPSY  10/2008   "had radiation tx for prostate cancer"  . SALIVARY STONE REMOVAL  2001  . TOTAL HIP ARTHROPLASTY Right November 2010  . TOTAL HIP ARTHROPLASTY Left 03/2010   Dr Alvan Dame    Social History   Socioeconomic History  . Marital status: Married    Spouse name: Not on file  . Number of children: Not on file  . Years of education: Not on file  . Highest education level: Not on file  Occupational History  . Not on file  Social Needs  .  Financial resource strain: Not hard at all  . Food insecurity:    Worry: Never true    Inability: Never true  . Transportation needs:    Medical: No    Non-medical: No  Tobacco Use  . Smoking status: Former Smoker    Packs/day: 2.50    Years: 15.00    Pack years: 37.50    Types: Cigarettes    Last attempt to quit: 01/30/1970    Years since quitting: 48.2  . Smokeless tobacco: Never Used  . Tobacco comment:  smoked 1959-1972  Substance and Sexual Activity  . Alcohol use: Yes    Alcohol/week: 4.0 standard drinks    Types: 4 Standard drinks or equivalent per week    Comment: 11/42016 "nothing to drink since 07/25/2014"  . Drug use: No  . Sexual activity: Not Currently  Lifestyle  . Physical activity:    Days per week: 4 days    Minutes per session: 60 min  . Stress: Not at all  Relationships  . Social connections:    Talks on phone: More than three times a week    Gets together: More than three times a week    Attends religious service: Not on file    Active member of club or organization: Not on file    Attends meetings of clubs or organizations: Not on file    Relationship status: Married  Other Topics Concern  . Not on file  Social History Narrative   Pt lives in [place] with spouse. 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  Enjoys reading and walking.    Family History  Problem Relation Age of Onset  . Diabetes Father   . Hypertension Father   . Lung cancer Father        smoker  . Heart failure Mother 71  . Hyperlipidemia Sister   . Coronary artery disease Sister   . Heart attack Paternal Aunt        X 2; both > 65  . Colon cancer Neg Hx   . Stroke Neg Hx     Review of Systems  Constitutional: Negative for chills and fever.  Respiratory: Negative for cough, shortness of breath and wheezing.   Cardiovascular: Negative for chest pain, palpitations and leg swelling.  Neurological: Positive for headaches (sinus pressure at times). Negative for dizziness and  light-headedness.       Objective:   Vitals:   04/03/18 0851  BP: 120/82  Pulse: 75  Resp: 16  Temp: 98.5 F (36.9 C)  SpO2: 95%   BP Readings from Last 3 Encounters:  04/03/18 120/82  10/02/17 138/70  03/29/17 (!) 142/70   Wt Readings from Last 3 Encounters:  04/03/18 205 lb 1.9 oz (93 kg)  10/02/17 207 lb 12.8 oz (94.3 kg)  03/29/17 215 lb 8 oz (97.8 kg)   Body mass index is 27.82 kg/m.   Physical Exam    Constitutional: Appears well-developed and well-nourished. No distress.  HENT:  Head: Normocephalic and atraumatic.  Neck: Neck supple. No tracheal deviation present. No thyromegaly present.  No cervical lymphadenopathy Cardiovascular: Normal rate, regular rhythm and normal heart sounds.   1/6 systolic murmur heard. No carotid bruit .  No edema Pulmonary/Chest: Effort normal and breath sounds normal. No respiratory distress. No has no wheezes. No rales.  Skin: Skin is warm and dry. Not diaphoretic.  Psychiatric: Normal mood and affect. Behavior is normal.   Diabetic Foot Exam - Simple   Simple Foot Form Diabetic Foot exam was performed with the following findings:  Yes 04/03/2018 12:51 PM  Visual Inspection No deformities, no ulcerations, no other skin breakdown bilaterally:  Yes Sensation Testing Intact to touch and monofilament testing bilaterally:  Yes Pulse Check Posterior Tibialis and Dorsalis pulse intact bilaterally:  Yes Comments       Assessment & Plan:    See Problem List for Assessment and Plan of chronic medical problems.

## 2018-04-03 ENCOUNTER — Ambulatory Visit: Payer: Medicare Other | Admitting: Internal Medicine

## 2018-04-03 ENCOUNTER — Encounter: Payer: Self-pay | Admitting: Internal Medicine

## 2018-04-03 ENCOUNTER — Other Ambulatory Visit (INDEPENDENT_AMBULATORY_CARE_PROVIDER_SITE_OTHER): Payer: Medicare Other

## 2018-04-03 VITALS — BP 120/82 | HR 75 | Temp 98.5°F | Resp 16 | Ht 72.0 in | Wt 205.1 lb

## 2018-04-03 DIAGNOSIS — G629 Polyneuropathy, unspecified: Secondary | ICD-10-CM | POA: Insufficient documentation

## 2018-04-03 DIAGNOSIS — E1351 Other specified diabetes mellitus with diabetic peripheral angiopathy without gangrene: Secondary | ICD-10-CM

## 2018-04-03 DIAGNOSIS — E782 Mixed hyperlipidemia: Secondary | ICD-10-CM

## 2018-04-03 DIAGNOSIS — I25810 Atherosclerosis of coronary artery bypass graft(s) without angina pectoris: Secondary | ICD-10-CM | POA: Diagnosis not present

## 2018-04-03 DIAGNOSIS — I10 Essential (primary) hypertension: Secondary | ICD-10-CM | POA: Diagnosis not present

## 2018-04-03 DIAGNOSIS — E1365 Other specified diabetes mellitus with hyperglycemia: Secondary | ICD-10-CM

## 2018-04-03 DIAGNOSIS — IMO0002 Reserved for concepts with insufficient information to code with codable children: Secondary | ICD-10-CM

## 2018-04-03 DIAGNOSIS — Z8739 Personal history of other diseases of the musculoskeletal system and connective tissue: Secondary | ICD-10-CM

## 2018-04-03 DIAGNOSIS — M79676 Pain in unspecified toe(s): Secondary | ICD-10-CM | POA: Insufficient documentation

## 2018-04-03 DIAGNOSIS — F419 Anxiety disorder, unspecified: Secondary | ICD-10-CM | POA: Diagnosis not present

## 2018-04-03 LAB — COMPREHENSIVE METABOLIC PANEL
ALT: 16 U/L (ref 0–53)
AST: 13 U/L (ref 0–37)
Albumin: 4.8 g/dL (ref 3.5–5.2)
Alkaline Phosphatase: 51 U/L (ref 39–117)
BILIRUBIN TOTAL: 0.7 mg/dL (ref 0.2–1.2)
BUN: 23 mg/dL (ref 6–23)
CO2: 27 mEq/L (ref 19–32)
Calcium: 9.8 mg/dL (ref 8.4–10.5)
Chloride: 102 mEq/L (ref 96–112)
Creatinine, Ser: 0.78 mg/dL (ref 0.40–1.50)
GFR: 96.65 mL/min (ref >60.00)
Glucose, Bld: 147 mg/dL — ABNORMAL HIGH (ref 70–99)
Potassium: 4.5 mEq/L (ref 3.5–5.1)
Sodium: 138 mEq/L (ref 135–145)
TOTAL PROTEIN: 7.7 g/dL (ref 6.0–8.3)

## 2018-04-03 LAB — VITAMIN B12: VITAMIN B 12: 415 pg/mL (ref 211–911)

## 2018-04-03 LAB — LIPID PANEL
Cholesterol: 130 mg/dL (ref 0–200)
HDL: 44.9 mg/dL (ref >39.00)
LDL Cholesterol: 61 mg/dL (ref 0–99)
NonHDL: 85.55
Total CHOL/HDL Ratio: 3
Triglycerides: 124 mg/dL (ref 0.0–149.0)
VLDL: 24.8 mg/dL (ref 0.0–40.0)

## 2018-04-03 LAB — HEMOGLOBIN A1C: Hgb A1c MFr Bld: 7.4 % — ABNORMAL HIGH (ref 4.6–6.5)

## 2018-04-03 MED ORDER — GABAPENTIN 100 MG PO CAPS: ORAL_CAPSULE | ORAL | 3 refills | Status: DC

## 2018-04-03 NOTE — Assessment & Plan Note (Addendum)
Probable neuropathy in b/l feet-possibly related to diabetes Check B12 level Discussed neurology referral for further evaluation We will try gabapentin 100-300 mg at bedtime

## 2018-04-03 NOTE — Assessment & Plan Note (Signed)
Controlled, stable Continue current dose of medication  

## 2018-04-03 NOTE — Assessment & Plan Note (Signed)
BP well controlled Current regimen effective and well tolerated Continue current medications at current doses cmp  

## 2018-04-03 NOTE — Assessment & Plan Note (Signed)
Fairly compliant with diabetic diet Getting approximately 5000 steps per day Check A1c-we will adjust medication if needed Feet pain suggestive of neuropathy, possibly related to diabetes

## 2018-04-03 NOTE — Assessment & Plan Note (Signed)
Check lipid panel  Continue daily statin, Zetia Regular exercise and healthy diet encouraged

## 2018-04-03 NOTE — Assessment & Plan Note (Signed)
No gout symptoms since his last visit Allopurinol 300 mg daily-continue

## 2018-04-03 NOTE — Assessment & Plan Note (Signed)
No chest pain, palpitations or shortness of breath Continue aspirin, statin, Zetia Check CMP, lipid panel

## 2018-04-06 ENCOUNTER — Encounter: Payer: Self-pay | Admitting: Internal Medicine

## 2018-04-08 ENCOUNTER — Other Ambulatory Visit: Payer: Self-pay | Admitting: Internal Medicine

## 2018-04-08 MED ORDER — EMPAGLIFLOZIN 25 MG PO TABS: 25.0000 mg | ORAL_TABLET | Freq: Every day | ORAL | 1 refills | Status: DC

## 2018-05-09 ENCOUNTER — Telehealth: Payer: Self-pay | Admitting: Internal Medicine

## 2018-05-09 MED ORDER — EMPAGLIFLOZIN 25 MG PO TABS: 25.0000 mg | ORAL_TABLET | Freq: Every day | ORAL | 1 refills | Status: DC

## 2018-05-09 NOTE — Telephone Encounter (Signed)
Refill sent. See meds.  

## 2018-05-09 NOTE — Telephone Encounter (Signed)
Copied from [place] [id number]. Topic: Quick Communication - Rx Refill/Question >> May 09, 2018 10:28 AM Alanda Slim E wrote: Medication: empagliflozin (JARDIANCE) 25 MG TABS tablet  Has the patient contacted their pharmacy? No   Preferred Pharmacy (with phone number or street name): [id number] - [address], [place] [phone] (Phone) [phone] (Fax)    Agent: Please be advised that RX refills may take up to 3 business days. We ask that you follow-up with your pharmacy.

## 2018-06-16 ENCOUNTER — Other Ambulatory Visit: Payer: Self-pay | Admitting: Internal Medicine

## 2018-06-17 ENCOUNTER — Other Ambulatory Visit: Payer: Self-pay

## 2018-06-17 MED ORDER — EZETIMIBE 10 MG PO TABS: 10.0000 mg | ORAL_TABLET | Freq: Every day | ORAL | 1 refills | Status: DC

## 2018-06-17 MED ORDER — ROSUVASTATIN CALCIUM 10 MG PO TABS: 10.0000 mg | ORAL_TABLET | Freq: Every day | ORAL | 0 refills | Status: DC

## 2018-06-17 NOTE — Telephone Encounter (Signed)
Rx(s) sent to pharmacy electronically.  

## 2018-06-17 NOTE — Addendum Note (Signed)
Addended by: Diana Eves on: 06/17/2018 05:59 PM   Modules accepted: Orders

## 2018-06-18 ENCOUNTER — Other Ambulatory Visit: Payer: Self-pay

## 2018-06-18 MED ORDER — ROSUVASTATIN CALCIUM 10 MG PO TABS: 10.0000 mg | ORAL_TABLET | Freq: Every day | ORAL | 1 refills | Status: DC

## 2018-08-04 IMAGING — DX DG CHEST 2V
2 series · 3 of 3 positions shown · non-contrast
Comparison: 02/02/2015

CLINICAL DATA: Cough and congestion for 2 weeks.

EXAM:
CHEST  2 VIEW

[Series 1: chest pa · 0.14mm/px · 2 of 2 slices shown]
[im 1/2]
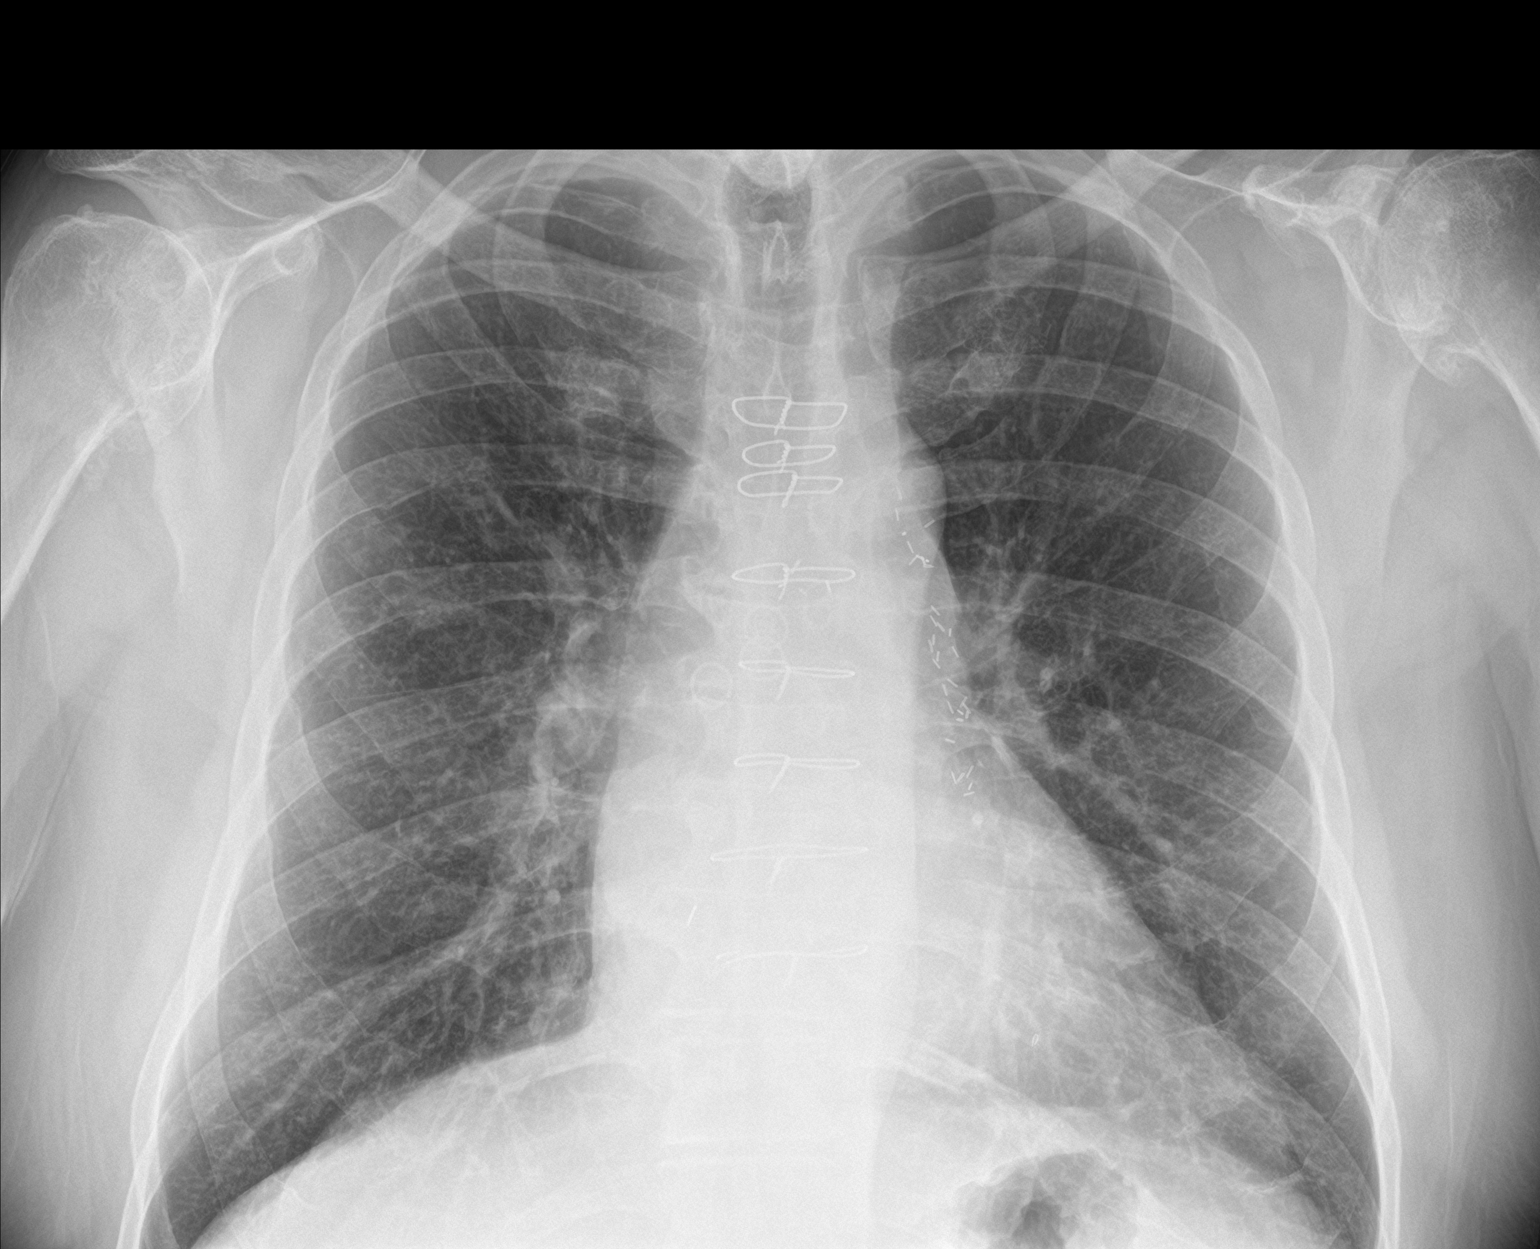
[im 2/2]
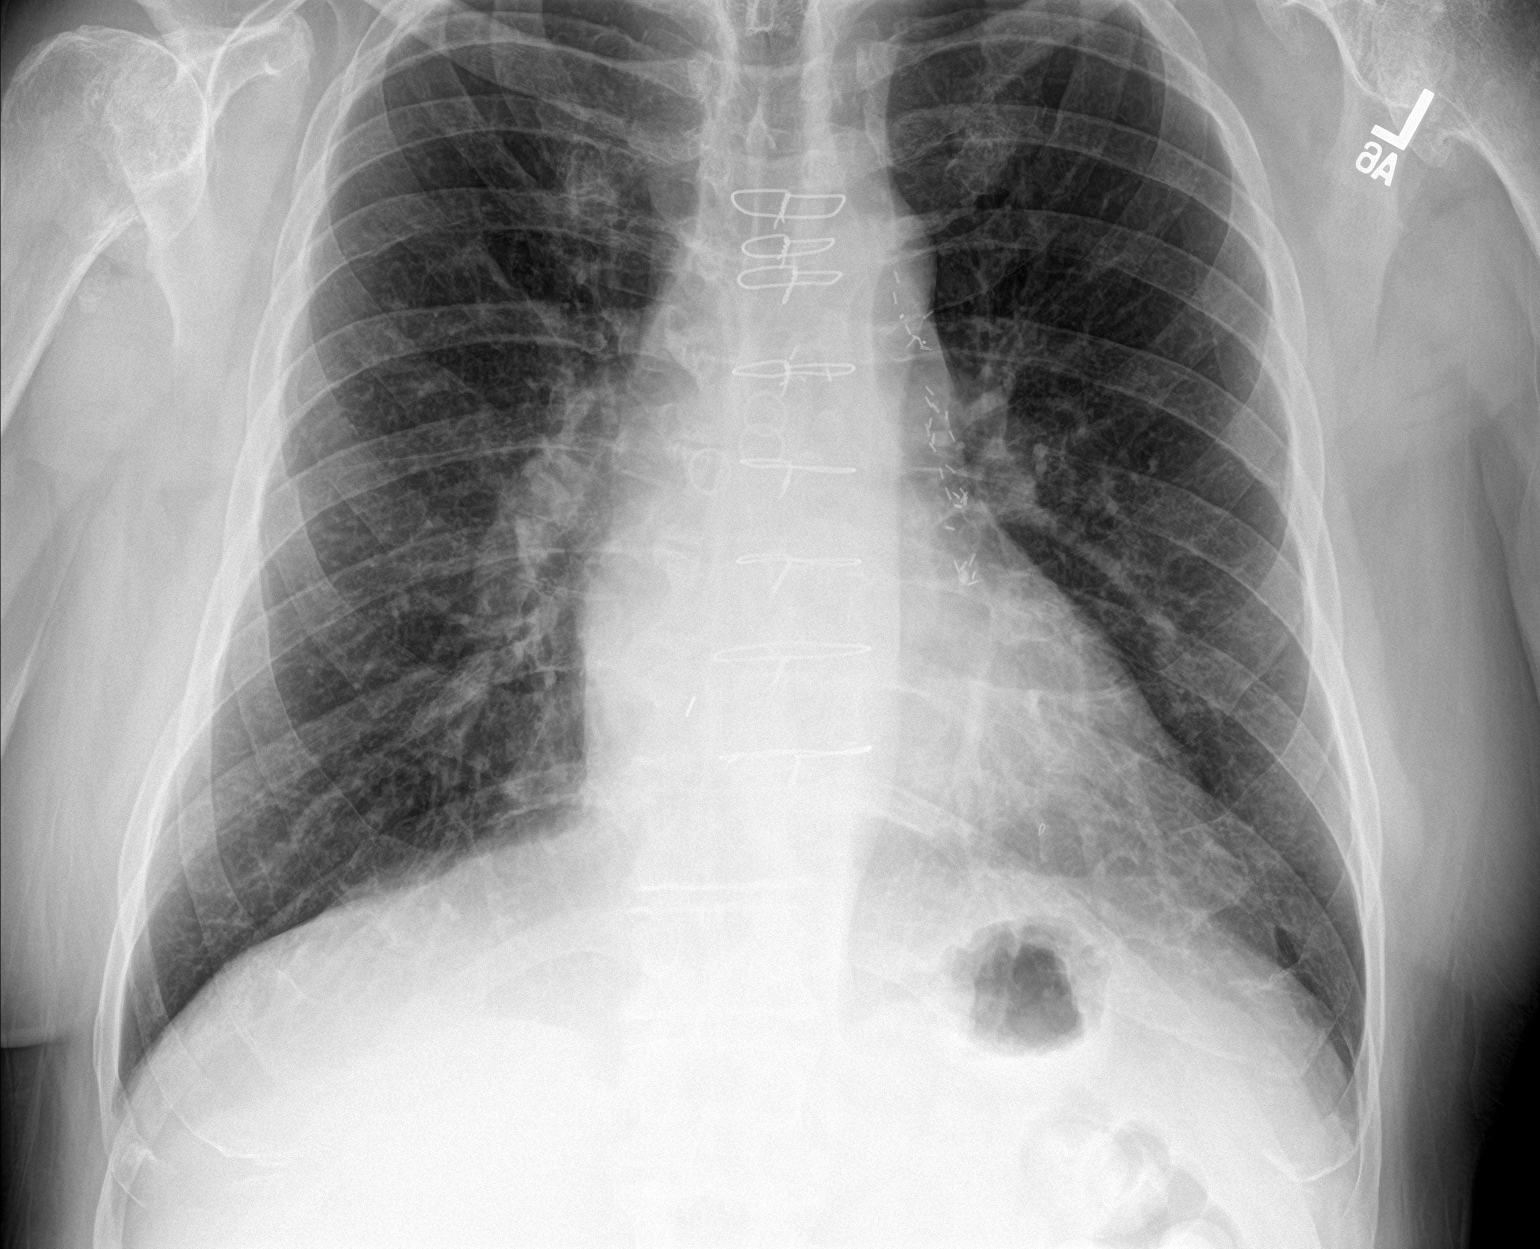

[chest lat]
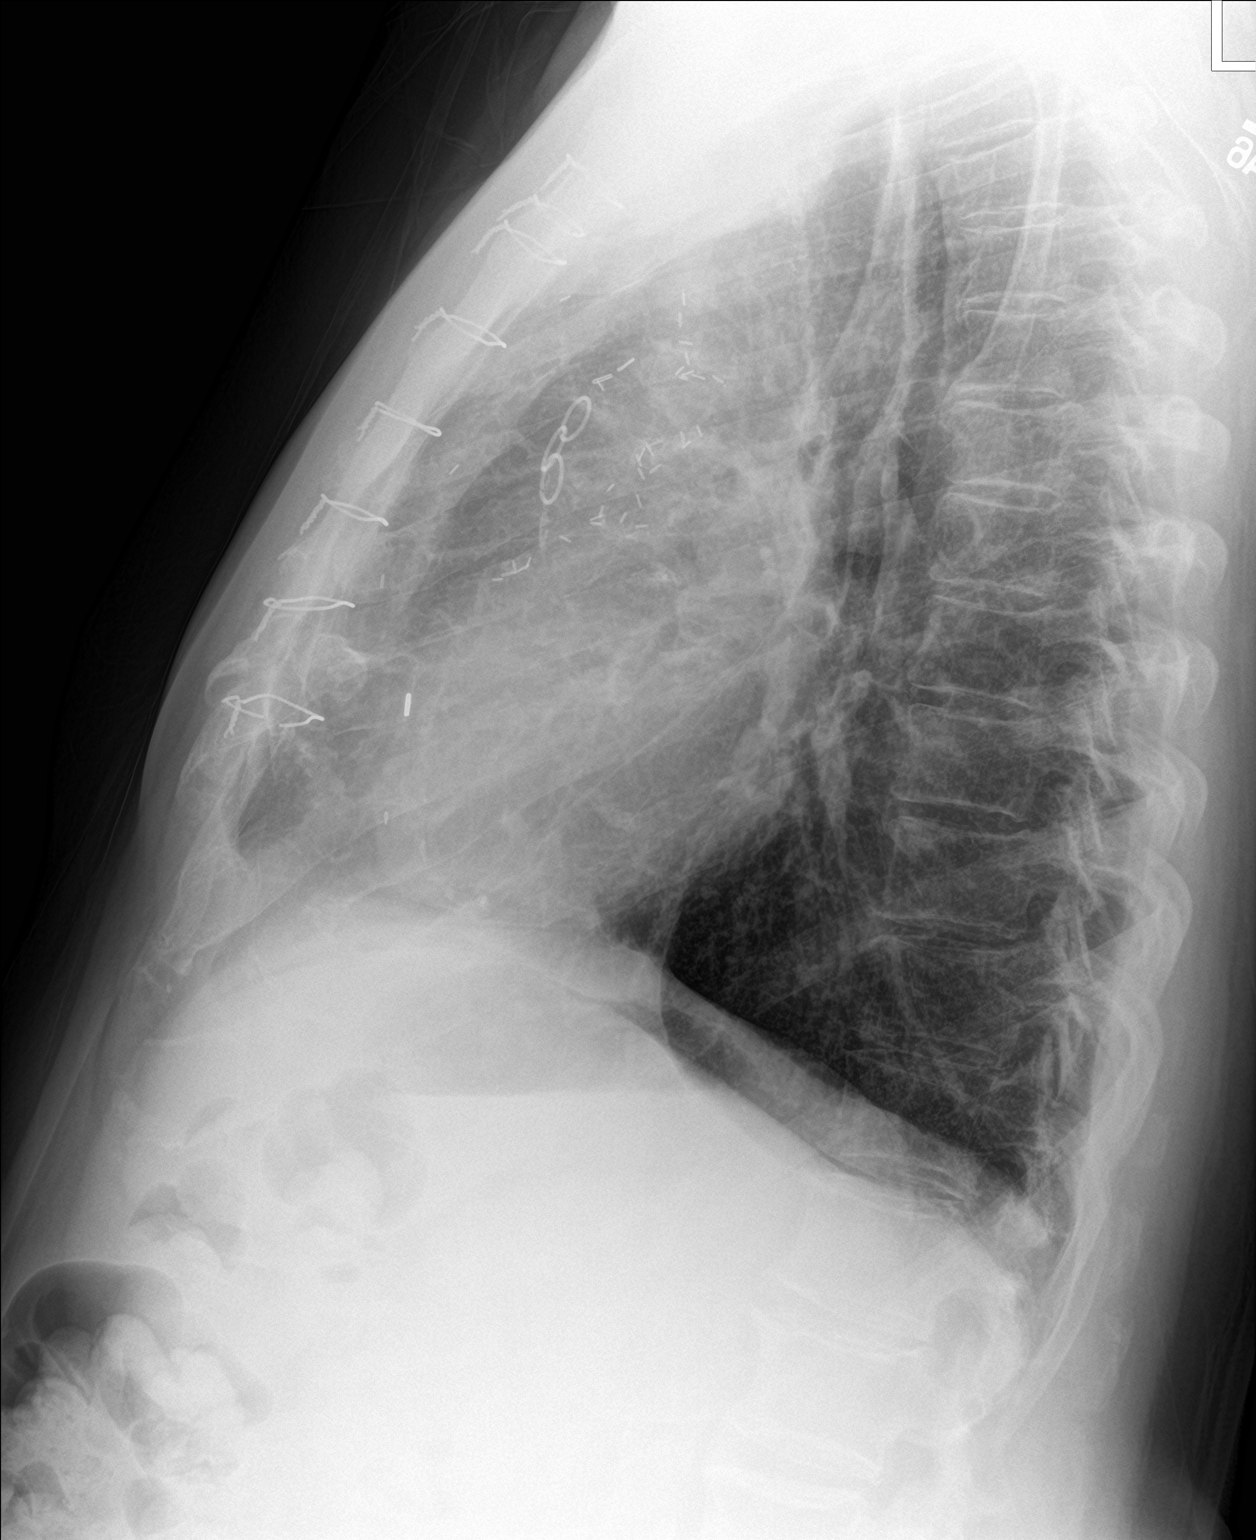

[3 of 3 positions shown; findings below may reference images not displayed]

FINDINGS: The cardiac silhouette, mediastinal and hilar contours are normal
and stable. Stable surgical changes from bypass surgery. The lungs
are clear of acute process. No infiltrates, edema or effusions. Mild
hyperinflation and attenuation of pulmonary vasculature may suggest
emphysematous changes. The bony thorax is intact.
IMPRESSION: No acute cardiopulmonary findings. No change since prior
examination.

## 2018-09-14 ENCOUNTER — Other Ambulatory Visit: Payer: Self-pay | Admitting: Internal Medicine

## 2018-09-16 ENCOUNTER — Other Ambulatory Visit: Payer: Self-pay

## 2018-09-19 ENCOUNTER — Other Ambulatory Visit: Payer: Self-pay | Admitting: Cardiology

## 2018-09-19 MED ORDER — ROSUVASTATIN CALCIUM 10 MG PO TABS: 10.0000 mg | ORAL_TABLET | Freq: Every day | ORAL | 0 refills | Status: DC

## 2018-09-19 NOTE — Telephone Encounter (Signed)
Pt's medication was sent to pt's pharmacy as requested. Confirmation received.  °

## 2018-09-25 ENCOUNTER — Other Ambulatory Visit: Payer: Self-pay | Admitting: Cardiology

## 2018-09-25 MED ORDER — ROSUVASTATIN CALCIUM 10 MG PO TABS: 10.0000 mg | ORAL_TABLET | Freq: Every day | ORAL | 0 refills | Status: DC

## 2018-09-25 NOTE — Telephone Encounter (Signed)
 *  STAT* If patient is at the pharmacy, call can be transferred to refill team.   1. Which medications need to be refilled? (please list name of each medication and dose if known) rosuvastatin (CRESTOR) 10 MG tablet  2. Which pharmacy/location (including street and city if local pharmacy) is medication to be sent to? [place]  3. Do they need a 30 day or 90 day supply? 90  Patient has appt on November 9th with Dr [place]

## 2018-10-04 ENCOUNTER — Encounter: Payer: Medicare Other | Admitting: Internal Medicine

## 2018-10-23 NOTE — Progress Notes (Signed)
Subjective:    Patient ID: Bryce Maldonado, male    DOB: 1940-10-25, 78 y.o.   MRN: [id number]  HPI He is here for a physical exam.   Neuropathy: he has occasional sharp that is transient in his toes.  His feet hurt all the time - mostly his toes.  There may be a little tingling.  He tried the gabapentin and stopped it because it did not seem to help. His feet have hurt "forever".  He thinks this has gotten a little worse.    He has not been compliant with a diabetic diet.    Medications and allergies reviewed with patient and updated if appropriate.  Patient Active Problem List   Diagnosis Date Noted  . Neuropathy 04/03/2018  . Mild aortic stenosis 10/02/2017  . Hypertension 09/27/2016  . Anxiety 03/27/2016  . SVT (supraventricular tachycardia) ([place]) 12/04/2014  . Typical atrial flutter ([place]) 11/03/2014  . PAF (paroxysmal atrial fibrillation) ([place]) 08/13/2014  . DM (diabetes mellitus), secondary, uncontrolled, with peripheral vascular complications ([place]) [id number]  . History of gout 03/04/2009  . ATOPIC RHINITIS 03/04/2009  . PROSTATE CANCER, HX OF 03/04/2009  . Mixed hyperlipidemia 08/06/2006  . Coronary atherosclerosis 08/06/2006    Current Outpatient Medications on File Prior to Visit  Medication Sig Dispense Refill  . allopurinol (ZYLOPRIM) 300 MG tablet TAKE 1 TABLET BY MOUTH EVERY DAY 90 tablet 1  . aspirin EC 81 MG tablet Take 81 mg by mouth daily.    . empagliflozin (JARDIANCE) 25 MG TABS tablet Take 25 mg by mouth daily. 90 tablet 1  . ezetimibe (ZETIA) 10 MG tablet Take 1 tablet (10 mg total) by mouth at bedtime. NEEDS APPOINTMENT FOR FUTURE REFILLS 90 tablet 1  . fexofenadine (ALLEGRA) 180 MG tablet Take 180 mg by mouth every morning.     [name] glucose blood (ONETOUCH VERIO) test strip 1 each by Other route 2 (two) times daily. Use to check  Blood sugars twice a day Dx E11.9 100 each 3  . JARDIANCE 10 MG TABS tablet TAKE 1 TABLET BY MOUTH DAILY. 90 tablet 1  .  ketoconazole (NIZORAL) 2 % shampoo Apply 1 application topically 2 (two) times a week. Use for 8 weeks then use prn 120 mL 0  . losartan (COZAAR) 25 MG tablet TAKE 1 TABLET(25 MG) BY MOUTH DAILY 90 tablet 1  . Melatonin 5 MG TABS Take 5 mg by mouth at bedtime.    . metFORMIN (GLUCOPHAGE) 500 MG tablet TAKE 1 TABLET BY MOUTH TWICE DAILY WITH A MEAL 180 tablet 1  . Multiple Vitamins-Minerals (CENTRUM PO) Take 1 tablet by mouth daily.     Glory Rosebush DELICA LANCETS [id number] MISC Use to help check blood sugars twice a day Dx E11.9 100 each 3  . rosuvastatin (CRESTOR) 10 MG tablet Take 1 tablet (10 mg total) by mouth at bedtime. KEEP OV. 90 tablet 0  . venlafaxine XR (EFFEXOR-XR) 75 MG 24 hr capsule TAKE 1 CAPSULE(75 MG) BY MOUTH DAILY 90 capsule 1   No current facility-administered medications on file prior to visit.     Past Medical History:  Diagnosis Date  . Anemia   . BPH (benign prostatic hyperplasia)    Elevated PSA  . Childhood asthma   . Coronary artery disease    post CABG x4 -- in 1995 -- due to three-vessel coronary artery disease  . Diverticular disease   . Dyslipidemia   . First degree AV block   . Gout   .  Osteoarthritis    "everywhere" (12/04/2014)  . Prostate cancer [facility]) 2012   Dr Karsten Ro  . Spinal stenosis 2004   L4 radiculopathy, herniated nucleus pulposus L4-5  . Tinnitus   . Type II diabetes mellitus ([place])   . Typical atrial flutter [facility])    s/p ablation    Past Surgical History:  Procedure Laterality Date  . APPENDECTOMY  1943  . ATRIAL FLUTTER ABLATION  12/04/2014  . BACK SURGERY    . CARDIAC CATHETERIZATION  02/10/2010   Est. EF of 60-65% -- Severe three-vessel obstructive atherosclerotic coronary artery disease -- All grafts were patent including left internal mammary artery graft to left anterior descending coronary artery, saphenous vein graft to the diagonal, saphenous vein graft to obtuse marginal vessel, and saphenous vein graft to the posterior descending  coronary artery -- Normal left ventricular function   . COLONOSCOPY     negative X3; Dr Carlean Purl  . CORONARY ARTERY BYPASS GRAFT  10/21/1993   x4 -- using the LIM artery graft to the LAD artery, with saphenous vein grafts to the diagonal branch of the LAD, OM branch the left circumflex coronary artery, and the posterior descending branch of the RCA -- Est. EF of 65%-- Surgeon: Gaye Pollack, M.D.              . ELECTROPHYSIOLOGIC STUDY N/A 10/01/2014   CTI ablation by Dr Curt Bears  . ELECTROPHYSIOLOGIC STUDY N/A 12/04/2014   repeat CTI ablation by Dr Rayann Heman  . FLEXIBLE SIGMOIDOSCOPY  2005    external hemorrhoids  . JOINT REPLACEMENT    . MICRODISCECTOMY LUMBAR  2004   L4-5, Dr. Arnoldo Morale  . PROSTATE BIOPSY  10/2008   "had radiation tx for prostate cancer"  . SALIVARY STONE REMOVAL  2001  . TOTAL HIP ARTHROPLASTY Right November 2010  . TOTAL HIP ARTHROPLASTY Left 03/2010   Dr Alvan Dame    Social History   Socioeconomic History  . Marital status: Married    Spouse name: Not on file  . Number of children: Not on file  . Years of education: Not on file  . Highest education level: Not on file  Occupational History  . Not on file  Social Needs  . Financial resource strain: Not hard at all  . Food insecurity    Worry: Never true    Inability: Never true  . Transportation needs    Medical: No    Non-medical: No  Tobacco Use  . Smoking status: Former Smoker    Packs/day: 2.50    Years: 15.00    Pack years: 37.50    Types: Cigarettes    Quit date: 01/30/1970    Years since quitting: 48.7  . Smokeless tobacco: Never Used  . Tobacco comment: smoked 1959-1972  Substance and Sexual Activity  . Alcohol use: Yes    Alcohol/week: 4.0 standard drinks    Types: 4 Standard drinks or equivalent per week    Comment: 11/42016 "nothing to drink since 07/25/2014"  . Drug use: No  . Sexual activity: Not Currently  Lifestyle  . Physical activity    Days per week: 4 days    Minutes per session: 60  min  . Stress: Not at all  Relationships  . Social connections    Talks on phone: More than three times a week    Gets together: More than three times a week    Attends religious service: Not on file    Active member of club or organization: Not on  file    Attends meetings of clubs or organizations: Not on file    Relationship status: Married  Other Topics Concern  . Not on file  Social History Narrative   Pt lives in [place] with spouse. 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  Enjoys reading and walking.    Family History  Problem Relation Age of Onset  . Diabetes Father   . Hypertension Father   . Lung cancer Father        smoker  . Heart failure Mother 26  . Hyperlipidemia Sister   . Coronary artery disease Sister   . Heart attack Paternal Aunt        X 2; both > 65  . Colon cancer Neg Hx   . Stroke Neg Hx     Review of Systems  Constitutional: Negative for chills and fever.  Eyes: Negative for visual disturbance.  Respiratory: Negative for cough, shortness of breath and wheezing.   Cardiovascular: Negative for chest pain, palpitations and leg swelling.  Gastrointestinal: Negative for abdominal pain, blood in stool, constipation, diarrhea and nausea.       No gerd  Genitourinary: Negative for difficulty urinating, dysuria and hematuria.  Musculoskeletal: Positive for arthralgias. Negative for back pain.  Skin: Negative for color change and rash.  Neurological: Positive for dizziness and numbness. Negative for light-headedness and headaches.  Psychiatric/Behavioral: Negative for dysphoric mood and sleep disturbance. The patient is nervous/anxious (controlled).        Objective:   Vitals:   10/24/18 0830  BP: 140/90  Pulse: 88  Temp: 97.7 F (36.5 C)  SpO2: 96%   Filed Weights   10/24/18 0830  Weight: 205 lb (93 kg)   Body mass index is 27.8 kg/m.    BP Readings from Last 3 Encounters:  10/24/18 140/90  04/03/18 120/82  10/02/17 138/70    Wt  Readings from Last 3 Encounters:  10/24/18 205 lb (93 kg)  04/03/18 205 lb 1.9 oz (93 kg)  10/02/17 207 lb 12.8 oz (94.3 kg)     Physical Exam Constitutional: He appears well-developed and well-nourished. No distress.  HENT:  Head: Normocephalic and atraumatic.  Right Ear: External ear normal.  Left Ear: External ear normal.  Mouth/Throat: Oropharynx is clear and moist.  Normal ear canals and TM b/l  Eyes: Conjunctivae and EOM are normal.  Neck: Neck supple. No tracheal deviation present. No thyromegaly present.  No carotid bruit  Cardiovascular: Normal rate, regular rhythm, normal heart sounds and intact distal pulses.   1/6 systolic murmur heard. Pulmonary/Chest: Effort normal and breath sounds normal. No respiratory distress. He has no wheezes. He has no rales.  Abdominal: Soft. He exhibits no distension. There is no tenderness.  Genitourinary: deferred  Musculoskeletal: He exhibits no edema.  Lymphadenopathy:   He has no cervical adenopathy.  Skin: Skin is warm and dry. He is not diaphoretic.  Psychiatric: He has a normal mood and affect. His behavior is normal.         Assessment & Plan:   Physical exam: Screening blood work  ordered Immunizations  Flu vaccine today, discussed shingrix Colonoscopy    No longer needed -done 2014   Eye exams    Up to date  Exercise   Walking, 5000 steps daily Weight  Mildly overweight Substance abuse   none     See Problem List for Assessment and Plan of chronic medical problems.   Fu in 6 mo

## 2018-10-23 NOTE — Patient Instructions (Addendum)
Tests ordered today. Your results will be released to [place] (or called to you) after review.  If any changes need to be made, you will be notified at that same time.  All other Health Maintenance issues reviewed.   All recommended immunizations and age-appropriate screenings are up-to-date or discussed.  Flu immunization administered today.    Medications reviewed and updated.  Changes include :   gabapentin 300 mg at night  Your prescription(s) have been submitted to your pharmacy. Please take as directed and contact our office if you believe you are having problem(s) with the medication(s).  A referral was ordered for neurology  Please followup in 6 months    Health Maintenance, Male Adopting a healthy lifestyle and getting preventive care are important in promoting health and wellness. Ask your health care provider about:  The right schedule for you to have regular tests and exams.  Things you can do on your own to prevent diseases and keep yourself healthy. What should I know about diet, weight, and exercise? Eat a healthy diet   Eat a diet that includes plenty of vegetables, fruits, low-fat dairy products, and lean protein.  Do not eat a lot of foods that are high in solid fats, added sugars, or sodium. Maintain a healthy weight Body mass index (BMI) is a measurement that can be used to identify possible weight problems. It estimates body fat based on height and weight. Your health care provider can help determine your BMI and help you achieve or maintain a healthy weight. Get regular exercise Get regular exercise. This is one of the most important things you can do for your health. Most adults should:  Exercise for at least 150 minutes each week. The exercise should increase your heart rate and make you sweat (moderate-intensity exercise).  Do strengthening exercises at least twice a week. This is in addition to the moderate-intensity exercise.  Spend less time  sitting. Even light physical activity can be beneficial. Watch cholesterol and blood lipids Have your blood tested for lipids and cholesterol at 77 years of age, then have this test every 5 years. You may need to have your cholesterol levels checked more often if:  Your lipid or cholesterol levels are high.  You are older than 77 years of age.  You are at high risk for heart disease. What should I know about cancer screening? Many types of cancers can be detected early and may often be prevented. Depending on your health history and family history, you may need to have cancer screening at various ages. This may include screening for:  Colorectal cancer.  Prostate cancer.  Skin cancer.  Lung cancer. What should I know about heart disease, diabetes, and high blood pressure? Blood pressure and heart disease  High blood pressure causes heart disease and increases the risk of stroke. This is more likely to develop in people who have high blood pressure readings, are of African descent, or are overweight.  Talk with your health care provider about your target blood pressure readings.  Have your blood pressure checked: ? Every 3-5 years if you are 26-73 years of age. ? Every year if you are 28 years old or older.  If you are between the ages of 75 and 74 and are a current or former smoker, ask your health care provider if you should have a one-time screening for abdominal aortic aneurysm (AAA). Diabetes Have regular diabetes screenings. This checks your fasting blood sugar level. Have the screening done:  Once every three years after age 31 if you are at a normal weight and have a low risk for diabetes.  More often and at a younger age if you are overweight or have a high risk for diabetes. What should I know about preventing infection? Hepatitis B If you have a higher risk for hepatitis B, you should be screened for this virus. Talk with your health care provider to find out if you  are at risk for hepatitis B infection. Hepatitis C Blood testing is recommended for:  Everyone born from [birth year] through 1964.  Anyone with known risk factors for hepatitis C. Sexually transmitted infections (STIs)  You should be screened each year for STIs, including gonorrhea and chlamydia, if: ? You are sexually active and are younger than 77 years of age. ? You are older than 77 years of age and your health care provider tells you that you are at risk for this type of infection. ? Your sexual activity has changed since you were last screened, and you are at increased risk for chlamydia or gonorrhea. Ask your health care provider if you are at risk.  Ask your health care provider about whether you are at high risk for HIV. Your health care provider may recommend a prescription medicine to help prevent HIV infection. If you choose to take medicine to prevent HIV, you should first get tested for HIV. You should then be tested every 3 months for as long as you are taking the medicine. Follow these instructions at home: Lifestyle  Do not use any products that contain nicotine or tobacco, such as cigarettes, e-cigarettes, and chewing tobacco. If you need help quitting, ask your health care provider.  Do not use street drugs.  Do not share needles.  Ask your health care provider for help if you need support or information about quitting drugs. Alcohol use  Do not drink alcohol if your health care provider tells you not to drink.  If you drink alcohol: ? Limit how much you have to 0-2 drinks a day. ? Be aware of how much alcohol is in your drink. In the [place] one drink equals one 12 oz bottle of beer (355 mL), one 5 oz glass of wine (148 mL), or one 1 oz glass of hard liquor (44 mL). General instructions  Schedule regular health, dental, and eye exams.  Stay current with your vaccines.  Tell your health care provider if: ? You often feel depressed. ? You have ever been abused or do  not feel safe at home. Summary  Adopting a healthy lifestyle and getting preventive care are important in promoting health and wellness.  Follow your health care provider's instructions about healthy diet, exercising, and getting tested or screened for diseases.  Follow your health care provider's instructions on monitoring your cholesterol and blood pressure. This information is not intended to replace advice given to you by your health care provider. Make sure you discuss any questions you have with your health care provider. Document Released: 07/15/2007 Document Revised: 01/09/2018 Document Reviewed: 01/09/2018 Elsevier Patient Education  2020 [employer].

## 2018-10-24 ENCOUNTER — Encounter: Payer: Self-pay | Admitting: Internal Medicine

## 2018-10-24 ENCOUNTER — Other Ambulatory Visit (INDEPENDENT_AMBULATORY_CARE_PROVIDER_SITE_OTHER): Payer: Medicare Other

## 2018-10-24 ENCOUNTER — Other Ambulatory Visit: Payer: Self-pay

## 2018-10-24 ENCOUNTER — Ambulatory Visit (INDEPENDENT_AMBULATORY_CARE_PROVIDER_SITE_OTHER): Payer: Medicare Other | Admitting: Internal Medicine

## 2018-10-24 VITALS — BP 140/90 | HR 88 | Temp 97.7°F | Ht 72.0 in | Wt 205.0 lb

## 2018-10-24 DIAGNOSIS — I10 Essential (primary) hypertension: Secondary | ICD-10-CM

## 2018-10-24 DIAGNOSIS — E782 Mixed hyperlipidemia: Secondary | ICD-10-CM | POA: Diagnosis not present

## 2018-10-24 DIAGNOSIS — IMO0002 Reserved for concepts with insufficient information to code with codable children: Secondary | ICD-10-CM

## 2018-10-24 DIAGNOSIS — Z0001 Encounter for general adult medical examination with abnormal findings: Secondary | ICD-10-CM | POA: Diagnosis not present

## 2018-10-24 DIAGNOSIS — Z23 Encounter for immunization: Secondary | ICD-10-CM | POA: Diagnosis not present

## 2018-10-24 DIAGNOSIS — F419 Anxiety disorder, unspecified: Secondary | ICD-10-CM

## 2018-10-24 DIAGNOSIS — I25810 Atherosclerosis of coronary artery bypass graft(s) without angina pectoris: Secondary | ICD-10-CM

## 2018-10-24 DIAGNOSIS — E1351 Other specified diabetes mellitus with diabetic peripheral angiopathy without gangrene: Secondary | ICD-10-CM | POA: Diagnosis not present

## 2018-10-24 DIAGNOSIS — E1365 Other specified diabetes mellitus with hyperglycemia: Secondary | ICD-10-CM

## 2018-10-24 DIAGNOSIS — G629 Polyneuropathy, unspecified: Secondary | ICD-10-CM | POA: Diagnosis not present

## 2018-10-24 DIAGNOSIS — Z8739 Personal history of other diseases of the musculoskeletal system and connective tissue: Secondary | ICD-10-CM

## 2018-10-24 DIAGNOSIS — Z8546 Personal history of malignant neoplasm of prostate: Secondary | ICD-10-CM

## 2018-10-24 DIAGNOSIS — I350 Nonrheumatic aortic (valve) stenosis: Secondary | ICD-10-CM

## 2018-10-24 LAB — CBC WITH DIFFERENTIAL/PLATELET
Basophils Absolute: 0.1 10*3/uL (ref 0.0–0.1)
Basophils Relative: 1.4 % (ref 0.0–3.0)
Eosinophils Absolute: 0.2 10*3/uL (ref 0.0–0.7)
Eosinophils Relative: 3.6 % (ref 0.0–5.0)
HCT: 48.1 % (ref 39.0–52.0)
Hemoglobin: 16.3 g/dL (ref 13.0–17.0)
Lymphocytes Relative: 23.2 % (ref 12.0–46.0)
Lymphs Abs: 1.4 10*3/uL (ref 0.7–4.0)
MCHC: 33.9 g/dL (ref 30.0–36.0)
MCV: 94.7 fl (ref 78.0–100.0)
Monocytes Absolute: 0.7 10*3/uL (ref 0.1–1.0)
Monocytes Relative: 11.8 % (ref 3.0–12.0)
Neutro Abs: 3.6 10*3/uL (ref 1.4–7.7)
Neutrophils Relative %: 60 % (ref 43.0–77.0)
Platelets: 263 10*3/uL (ref 150.0–400.0)
RBC: 5.08 Mil/uL (ref 4.22–5.81)
RDW: 13.7 % (ref 11.5–15.5)
WBC: 6.1 10*3/uL (ref 4.0–10.5)

## 2018-10-24 LAB — LIPID PANEL
Cholesterol: 134 mg/dL (ref 0–200)
HDL: 49.2 mg/dL (ref >39.00)
LDL Cholesterol: 64 mg/dL (ref 0–99)
NonHDL: 85
Total CHOL/HDL Ratio: 3
Triglycerides: 106 mg/dL (ref 0.0–149.0)
VLDL: 21.2 mg/dL (ref 0.0–40.0)

## 2018-10-24 LAB — COMPREHENSIVE METABOLIC PANEL
ALT: 15 U/L (ref 0–53)
AST: 13 U/L (ref 0–37)
Albumin: 5 g/dL (ref 3.5–5.2)
Alkaline Phosphatase: 51 U/L (ref 39–117)
BUN: 22 mg/dL (ref 6–23)
CO2: 29 mEq/L (ref 19–32)
Calcium: 10.7 mg/dL — ABNORMAL HIGH (ref 8.4–10.5)
Chloride: 100 mEq/L (ref 96–112)
Creatinine, Ser: 0.83 mg/dL (ref 0.40–1.50)
GFR: 89.83 mL/min (ref >60.00)
Glucose, Bld: 141 mg/dL — ABNORMAL HIGH (ref 70–99)
Potassium: 4.5 mEq/L (ref 3.5–5.1)
Sodium: 138 mEq/L (ref 135–145)
Total Bilirubin: 0.7 mg/dL (ref 0.2–1.2)
Total Protein: 8 g/dL (ref 6.0–8.3)

## 2018-10-24 LAB — TSH: TSH: 0.99 u[IU]/mL (ref 0.35–4.50)

## 2018-10-24 LAB — HEMOGLOBIN A1C: Hgb A1c MFr Bld: 7.5 % — ABNORMAL HIGH (ref 4.6–6.5)

## 2018-10-24 MED ORDER — GABAPENTIN 300 MG PO CAPS: 300.0000 mg | ORAL_CAPSULE | Freq: Every day | ORAL | 3 refills | Status: DC

## 2018-10-24 NOTE — Assessment & Plan Note (Signed)
No gout symptoms Continue allopurinol 

## 2018-10-24 NOTE — Assessment & Plan Note (Signed)
Controlled, stable Continue current dose of medication  

## 2018-10-24 NOTE — Assessment & Plan Note (Signed)
Follows with urology

## 2018-10-24 NOTE — Assessment & Plan Note (Signed)
Following with cards No concerning symptoms for angina Continue current medications Cbc, cmp, lipids, tsh

## 2018-10-24 NOTE — Assessment & Plan Note (Signed)
Check a1c Low sugar / carb diet Stressed regular exercise   

## 2018-10-24 NOTE — Assessment & Plan Note (Signed)
Mild, stable on exam

## 2018-10-24 NOTE — Assessment & Plan Note (Signed)
Check lipid panel  Continue daily statin Regular exercise and healthy diet encouraged  

## 2018-10-24 NOTE — Assessment & Plan Note (Signed)
Still with foot pain - ? Neuropathy, ? From DM Stressed good sugar controlled Try gabapentin 300 mg HS Refer to neuro

## 2018-10-24 NOTE — Assessment & Plan Note (Signed)
BP a little high today, but typically better BP overall controlled Current regimen effective and well tolerated Continue current medications at current doses cmp

## 2018-10-28 MED ORDER — METFORMIN HCL 500 MG PO TABS: 1000.0000 mg | ORAL_TABLET | Freq: Two times a day (BID) | ORAL | 1 refills | Status: DC

## 2018-11-03 ENCOUNTER — Other Ambulatory Visit: Payer: Self-pay | Admitting: Internal Medicine

## 2018-11-06 ENCOUNTER — Encounter: Payer: Self-pay | Admitting: Internal Medicine

## 2018-12-05 NOTE — Progress Notes (Signed)
[facility]. [address], [address], [place]  [address] Phone: [phone] Fax:  [phone]  Date:  12/09/2018   ID:  Bryce Maldonado, DOB 08/17/1940, MRN [id number]  PCP:  Binnie Rail, MD  Cardiologist:  Dr. Chales Pelissier [place]     History of Present Illness: Bryce Maldonado is a 78 y.o. male is seen for follow up CAD and atrial flutter.   He has a hx of CAD, s/p CABG in 1995, HL, prostate CA, DJD, spinal stenosis. Prior to left total hip replacement in 03/2010 he underwent a Lexiscan Myoview. This demonstrated an EF of 66% and inferior ischemia. LHC 01/2010: Proximal LAD 90%, proximal OM totally occluded, RCA occluded, SVG-PDA patent, SVG-diagonal patent, SVG-OM patent, LIMA-LAD patent. There were left to right collaterals. EF 60-65%. He was treated medically.   In 2016 he had new onset atrial flutter. He was anticoagulated and underwent Ablation by Dr. Rayann Heman on October 01, 2014. He had repeat ablation on Nov. 4 2016. No recurrent arrhythmia since.  On follow up today he is doing very well. He denies any chest pain, dyspnea, palpitations, dizziness, edema. He has lost 20 lbs this year.  He is walking daily.  Wt Readings from Last 3 Encounters:  12/09/18 188 lb (85.3 kg)  10/24/18 205 lb (93 kg)  04/03/18 205 lb 1.9 oz (93 kg)     Past Medical History:  Diagnosis Date  . Anemia   . BPH (benign prostatic hyperplasia)    Elevated PSA  . Childhood asthma   . Coronary artery disease    post CABG x4 -- in 1995 -- due to three-vessel coronary artery disease  . Diverticular disease   . Dyslipidemia   . First degree AV block   . Gout   . Osteoarthritis    "everywhere" (12/04/2014)  . Prostate cancer [facility]) 2012   Dr Karsten Ro  . Spinal stenosis 2004   L4 radiculopathy, herniated nucleus pulposus L4-5  . Tinnitus   . Type II diabetes mellitus ([place])   . Typical atrial flutter [facility])    s/p ablation    Current Outpatient Medications  Medication Sig Dispense Refill  .  allopurinol (ZYLOPRIM) 300 MG tablet TAKE 1 TABLET BY MOUTH EVERY DAY 90 tablet 1  . ezetimibe (ZETIA) 10 MG tablet Take 1 tablet (10 mg total) by mouth at bedtime. NEEDS APPOINTMENT FOR FUTURE REFILLS 90 tablet 1  . fexofenadine (ALLEGRA) 180 MG tablet Take 180 mg by mouth every morning.     [name] glucose blood (ONETOUCH VERIO) test strip 1 each by Other route 2 (two) times daily. Use to check  Blood sugars twice a day Dx E11.9 100 each 3  . JARDIANCE 25 MG TABS tablet TAKE 1 TABLET BY MOUTH DAILY. 90 tablet 1  . losartan (COZAAR) 25 MG tablet TAKE 1 TABLET(25 MG) BY MOUTH DAILY 90 tablet 1  . Melatonin 5 MG TABS Take 5 mg by mouth at bedtime.    . metFORMIN (GLUCOPHAGE) 500 MG tablet Take 2 tablets (1,000 mg total) by mouth 2 (two) times daily with a meal. 360 tablet 1  . Multiple Vitamins-Minerals (CENTRUM PO) Take 1 tablet by mouth daily.     Glory Rosebush DELICA LANCETS 99991111 MISC Use to help check blood sugars twice a day Dx E11.9 100 each 3  . rosuvastatin (CRESTOR) 10 MG tablet Take 1 tablet (10 mg total) by mouth at bedtime. KEEP OV. 90 tablet 0  . venlafaxine XR (EFFEXOR-XR) 75 MG 24  hr capsule TAKE 1 CAPSULE(75 MG) BY MOUTH DAILY 90 capsule 1  . apixaban (ELIQUIS) 5 MG TABS tablet Take 1 tablet (5 mg total) by mouth 2 (two) times daily. 60 tablet 11  . gabapentin (NEURONTIN) 300 MG capsule Take 1 capsule (300 mg total) by mouth at bedtime. (Patient not taking: Reported on 12/09/2018) 30 capsule 3  . ketoconazole (NIZORAL) 2 % shampoo Apply 1 application topically 2 (two) times a week. Use for 8 weeks then use prn (Patient not taking: Reported on 12/09/2018) 120 mL 0   No current facility-administered medications for this visit.     Allergies:    Allergies  Allergen Reactions  . Naproxen Sodium     rash    Social History:  The patient  reports that he quit smoking about 48 years ago. His smoking use included cigarettes. He has a 37.50 pack-year smoking history. He has never used smokeless  tobacco. He reports current alcohol use of about 4.0 standard drinks of alcohol per week. He reports that he does not use drugs.   ROS:  Please see the history of present illness.      All other systems reviewed and negative.   PHYSICAL EXAM: VS:  BP (!) 156/76   Pulse 99   Temp (!) 97.2 F (36.2 C)   Ht 6' (1.829 m)   Wt 188 lb (85.3 kg)   SpO2 95%   BMI 25.50 kg/m  GENERAL:  Well appearing WM in NAD HEENT:  PERRL, EOMI, sclera are clear. Oropharynx is clear. NECK:  No jugular venous distention, carotid upstroke brisk and symmetric, no bruits, no thyromegaly or adenopathy LUNGS:  Clear to auscultation bilaterally CHEST:  Unremarkable HEART:  IRRR,  PMI not displaced or sustained,S1 and S2 within normal limits, no S3, no S4: no clicks, no rubs, gr 2/6 systolic murmur RUSB to apex. ABD:  Soft, nontender. BS +, no masses or bruits. No hepatomegaly, no splenomegaly EXT:  2 + pulses throughout, no edema, no cyanosis no clubbing SKIN:  Warm and dry.  No rashes NEURO:  Alert and oriented x 3. Cranial nerves II through XII intact. PSYCH:  Cognitively intact    Laboratory data:  Lab Results  Component Value Date   WBC 6.1 10/24/2018   HGB 16.3 10/24/2018   HCT 48.1 10/24/2018   PLT 263.0 10/24/2018   GLUCOSE 141 (H) 10/24/2018   CHOL 134 10/24/2018   TRIG 106.0 10/24/2018   HDL 49.20 10/24/2018   LDLDIRECT 86.2 06/24/2010   LDLCALC 64 10/24/2018   ALT 15 10/24/2018   AST 13 10/24/2018   NA 138 10/24/2018   K 4.5 10/24/2018   CL 100 10/24/2018   CREATININE 0.83 10/24/2018   BUN 22 10/24/2018   CO2 29 10/24/2018   TSH 0.99 10/24/2018   PSA 6.12 (H) 08/06/2006   INR 0.97 03/04/2010   HGBA1C 7.5 (H) 10/24/2018   MICROALBUR 1.1 09/27/2016    Ecg today shows Afib with rate 85 bpm. Nonspecific ST T abnormality. I have personally reviewed and interpreted this study.  ASSESSMENT AND PLAN:  1. CAD: s/p CABG 1995.  He is active and asymptomatic.  He had a LHC in 2012 that  demonstrated patent bypass grafts.  Continue medical Rx.  2. Atrial flutter- s/p  Ablation x 2 in 2016. Now with recurrent Afib. Rate is controlled and he is completely asymptomatic. [place] vasc score of 4. Recommend long term anticoagulation. Will stop ASA and start Eliquis 5 mg bid.  3.  Hyperlipidemia:  Well controlled on statin.  4.   DM per primary care. A1c 7.5%  5.   HTN. Patient reports good control at home  6.   Murmur. Mild AS by Echo in 2016. Will update Echo now.   I will follow up in one year.  Signed, Zyona Pettaway [place] MD, [facility]

## 2018-12-09 ENCOUNTER — Encounter: Payer: Self-pay | Admitting: Cardiology

## 2018-12-09 ENCOUNTER — Ambulatory Visit: Payer: Medicare Other | Admitting: Cardiology

## 2018-12-09 ENCOUNTER — Other Ambulatory Visit: Payer: Self-pay

## 2018-12-09 VITALS — BP 156/76 | HR 99 | Temp 97.2°F | Ht 72.0 in | Wt 188.0 lb

## 2018-12-09 DIAGNOSIS — I4819 Other persistent atrial fibrillation: Secondary | ICD-10-CM

## 2018-12-09 DIAGNOSIS — I25810 Atherosclerosis of coronary artery bypass graft(s) without angina pectoris: Secondary | ICD-10-CM

## 2018-12-09 DIAGNOSIS — IMO0002 Reserved for concepts with insufficient information to code with codable children: Secondary | ICD-10-CM

## 2018-12-09 DIAGNOSIS — E1365 Other specified diabetes mellitus with hyperglycemia: Secondary | ICD-10-CM

## 2018-12-09 DIAGNOSIS — E782 Mixed hyperlipidemia: Secondary | ICD-10-CM | POA: Diagnosis not present

## 2018-12-09 DIAGNOSIS — E1351 Other specified diabetes mellitus with diabetic peripheral angiopathy without gangrene: Secondary | ICD-10-CM | POA: Diagnosis not present

## 2018-12-09 DIAGNOSIS — I10 Essential (primary) hypertension: Secondary | ICD-10-CM

## 2018-12-09 MED ORDER — APIXABAN 5 MG PO TABS: 5.0000 mg | ORAL_TABLET | Freq: Two times a day (BID) | ORAL | 11 refills | Status: DC

## 2018-12-09 NOTE — Patient Instructions (Signed)
Stop taking ASA  Start Eliquis  5 mg twice a day  We will schedule you for an Echocardiogram.

## 2018-12-09 NOTE — Addendum Note (Signed)
Addended by: Kathyrn Lass on: 12/09/2018 02:56 PM   Modules accepted: Orders

## 2018-12-13 ENCOUNTER — Other Ambulatory Visit: Payer: Self-pay

## 2018-12-13 ENCOUNTER — Other Ambulatory Visit: Payer: Self-pay | Admitting: Internal Medicine

## 2018-12-13 MED ORDER — ROSUVASTATIN CALCIUM 10 MG PO TABS: 10.0000 mg | ORAL_TABLET | Freq: Every day | ORAL | 0 refills | Status: DC

## 2018-12-13 MED ORDER — EZETIMIBE 10 MG PO TABS: 10.0000 mg | ORAL_TABLET | Freq: Every day | ORAL | 1 refills | Status: DC

## 2018-12-20 ENCOUNTER — Ambulatory Visit (INDEPENDENT_AMBULATORY_CARE_PROVIDER_SITE_OTHER): Payer: Medicare Other | Admitting: Neurology

## 2018-12-20 ENCOUNTER — Other Ambulatory Visit: Payer: Self-pay

## 2018-12-20 ENCOUNTER — Encounter: Payer: Self-pay | Admitting: Neurology

## 2018-12-20 VITALS — BP 120/82 | HR 74 | Ht 72.0 in | Wt 208.0 lb

## 2018-12-20 DIAGNOSIS — E1142 Type 2 diabetes mellitus with diabetic polyneuropathy: Secondary | ICD-10-CM

## 2018-12-20 DIAGNOSIS — M79675 Pain in left toe(s): Secondary | ICD-10-CM | POA: Diagnosis not present

## 2018-12-20 DIAGNOSIS — M79674 Pain in right toe(s): Secondary | ICD-10-CM

## 2018-12-20 NOTE — Progress Notes (Signed)
[facility] Clinic Note - Initial Visit   Date: 12/20/18  Bryce Maldonado MRN: [id number] DOB: [birth year]   Dear Dr. Quay Burow:  Thank you for your kind referral of Bryce Maldonado for consultation of bilateral feet pain. Although his history is well known to you, please allow [place] to reiterate it for the purpose of our medical record. The patient was accompanied to the clinic by self.    History of Present Illness: Bryce Maldonado is a 78 y.o. left-handed male with diabetes mellitus, hyperlipidemia, atrial flutter s/p ablation (on xeralto) presenting for evaluation of bilateral feet pain.   Starting around Bryce Maldonado, he began having achy and throbbing pain in the toes, specifically involving the joints. Over the past 15 years, his pain has become more intense.  Pain in the toes is always worse after prolonged standing or walking.  Rest helps. He has not tried tylenol or NSAIDs.  This summer he also began having stiffness and joint pain in the hands. He tried gabapentin which did not provide any relief.   He has mild tingling in the feet.  He denies numbness or burning pain in the feet.  His balance is good.  He walks unassisted.  He has diabetes for the past ~ 5 years, last HbA1c was 7.5.   He is retired history Pharmacist, hospital.  He denies history of alcohol use.  No family history of neuropathy.   Out-side paper records, electronic medical record, and images have been reviewed where available and summarized as:  Lab Results  Component Value Date   HGBA1C 7.5 (H) 10/24/2018   Lab Results  Component Value Date   [id number] 04/03/2018   Lab Results  Component Value Date   TSH 0.99 10/24/2018   Lab Results  Component Value Date   ESRSEDRATE 20 02/14/2011    Past Medical History:  Diagnosis Date  . Anemia   . BPH (benign prostatic hyperplasia)    Elevated PSA  . Childhood asthma   . Coronary artery disease    post CABG x4 -- in 1995 -- due to  three-vessel coronary artery disease  . Diverticular disease   . Dyslipidemia   . First degree AV block   . Gout   . Osteoarthritis    "everywhere" (12/04/2014)  . Prostate cancer [facility]) 2012   Dr Karsten Ro  . Spinal stenosis 2004   L4 radiculopathy, herniated nucleus pulposus L4-5  . Tinnitus   . Type II diabetes mellitus ([place])   . Typical atrial flutter [facility])    s/p ablation    Past Surgical History:  Procedure Laterality Date  . APPENDECTOMY  1943  . ATRIAL FLUTTER ABLATION  12/04/2014  . BACK SURGERY    . CARDIAC CATHETERIZATION  02/10/2010   Est. EF of 60-65% -- Severe three-vessel obstructive atherosclerotic coronary artery disease -- All grafts were patent including left internal mammary artery graft to left anterior descending coronary artery, saphenous vein graft to the diagonal, saphenous vein graft to obtuse marginal vessel, and saphenous vein graft to the posterior descending coronary artery -- Normal left ventricular function   . COLONOSCOPY     negative X3; Dr Carlean Purl  . CORONARY ARTERY BYPASS GRAFT  10/21/1993   x4 -- using the LIM artery graft to the LAD artery, with saphenous vein grafts to the diagonal branch of the LAD, OM branch the left circumflex coronary artery, and the posterior descending branch of the RCA -- Est. EF of 65%-- Surgeon: Gaye Pollack,  M.D.              [name] ELECTROPHYSIOLOGIC STUDY N/A 10/01/2014   CTI ablation by Dr Curt Bears  . ELECTROPHYSIOLOGIC STUDY N/A 12/04/2014   repeat CTI ablation by Dr Rayann Heman  . FLEXIBLE SIGMOIDOSCOPY  2005    external hemorrhoids  . JOINT REPLACEMENT    . MICRODISCECTOMY LUMBAR  2004   L4-5, Dr. Arnoldo Morale  . PROSTATE BIOPSY  10/2008   "had radiation tx for prostate cancer"  . SALIVARY STONE REMOVAL  2001  . TOTAL HIP ARTHROPLASTY Right November 2010  . TOTAL HIP ARTHROPLASTY Left 03/2010   Dr Alvan Dame     Medications:  Outpatient Encounter Medications as of 12/20/2018  Medication Sig  . allopurinol (ZYLOPRIM) 300 MG  tablet TAKE 1 TABLET BY MOUTH EVERY DAY  . apixaban (ELIQUIS) 5 MG TABS tablet Take 1 tablet (5 mg total) by mouth 2 (two) times daily.  [name] ezetimibe (ZETIA) 10 MG tablet Take 1 tablet (10 mg total) by mouth at bedtime. NEEDS APPOINTMENT FOR FUTURE REFILLS  . fexofenadine (ALLEGRA) 180 MG tablet Take 180 mg by mouth every morning.   . gabapentin (NEURONTIN) 300 MG capsule Take 1 capsule (300 mg total) by mouth at bedtime.  [name] glucose blood (ONETOUCH VERIO) test strip 1 each by Other route 2 (two) times daily. Use to check  Blood sugars twice a day Dx E11.9  . JARDIANCE 10 MG TABS tablet Take 10 mg by mouth daily.  [name] JARDIANCE 25 MG TABS tablet TAKE 1 TABLET BY MOUTH DAILY.  [name] ketoconazole (NIZORAL) 2 % shampoo Apply 1 application topically 2 (two) times a week. Use for 8 weeks then use prn  . losartan (COZAAR) 25 MG tablet TAKE 1 TABLET(25 MG) BY MOUTH DAILY  . Melatonin 5 MG TABS Take 5 mg by mouth at bedtime.  . metFORMIN (GLUCOPHAGE) 500 MG tablet Take 2 tablets (1,000 mg total) by mouth 2 (two) times daily with a meal.  . Multiple Vitamins-Minerals (CENTRUM PO) Take 1 tablet by mouth daily.   Glory Rosebush DELICA LANCETS 99991111 MISC Use to help check blood sugars twice a day Dx E11.9  . rosuvastatin (CRESTOR) 10 MG tablet Take 1 tablet (10 mg total) by mouth at bedtime. KEEP OV.  . venlafaxine XR (EFFEXOR-XR) 75 MG 24 hr capsule TAKE 1 CAPSULE(75 MG) BY MOUTH DAILY   No facility-administered encounter medications on file as of 12/20/2018.     Allergies:  Allergies  Allergen Reactions  . Naproxen Sodium     rash    Family History: Family History  Problem Relation Age of Onset  . Diabetes Father   . Hypertension Father   . Lung cancer Father        smoker  . Heart failure Mother 34  . Hyperlipidemia Sister   . Coronary artery disease Sister   . Heart attack Paternal Aunt        X 2; both > 65  . Colon cancer Neg Hx   . Stroke Neg Hx     Social History: Social History    Tobacco Use  . Smoking status: Former Smoker    Packs/day: 2.50    Years: 15.00    Pack years: 37.50    Types: Cigarettes    Quit date: 01/30/1970    Years since quitting: 48.9  . Smokeless tobacco: Never Used  . Tobacco comment: smoked 1959-1972  Substance Use Topics  . Alcohol use: Yes    Alcohol/week: 4.0 standard drinks  Types: 4 Standard drinks or equivalent per week    Comment: 11/42016 "nothing to drink since 07/25/2014"  . Drug use: No   Social History   Social History Narrative   Pt lives in [place] with spouse. 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  Enjoys reading and walking.   Left handed   [place]    Review of Systems:  CONSTITUTIONAL: No fevers, chills, night sweats, or weight loss.   EYES: No visual changes or eye pain ENT: No hearing changes.  No history of nose bleeds.   RESPIRATORY: No cough, wheezing and shortness of breath.   CARDIOVASCULAR: Negative for chest pain, and palpitations.   GI: Negative for abdominal discomfort, blood in stools or black stools.  No recent change in bowel habits.   GU:  No history of incontinence.   MUSCLOSKELETAL: No history of joint pain or swelling.  No myalgias.   SKIN: Negative for lesions, rash, and itching.   HEMATOLOGY/ONCOLOGY: Negative for prolonged bleeding, bruising easily, and swollen nodes.  No history of cancer.   ENDOCRINE: Negative for cold or heat intolerance, polydipsia or goiter.   PSYCH:  No depression or anxiety symptoms.   NEURO: As Above.   Vital Signs:  BP 120/82   Pulse 74   Ht 6' (1.829 m)   Wt 208 lb (94.3 kg)   SpO2 95%   BMI 28.21 kg/m    General Medical Exam:   General:  Well appearing, comfortable.   Eyes/ENT: see cranial nerve examination.   Neck:   No carotid bruits. Respiratory:  Clear to auscultation, good air entry bilaterally.   Cardiac:  Regular rate and rhythm, no murmur.   Extremities:  No deformities, edema, or skin discoloration.  Skin:  No rashes or lesions.   Neurological Exam: MENTAL STATUS including orientation to time, place, person, recent and remote memory, attention span and concentration, language, and fund of knowledge is normal.  Speech is not dysarthric.  CRANIAL NERVES: II:  No visual field defects. .   III-IV-VI: Pupils equal round and reactive to light.  Normal conjugate, extra-ocular eye movements in all directions of gaze.  No nystagmus.  No ptosis.   V:  Normal facial sensation.    VII:  Normal facial symmetry and movements.   VIII:  Normal hearing and vestibular function.   IX-X:  Normal palatal movement.   XI:  Normal shoulder shrug and head rotation.   XII:  Normal tongue strength and range of motion, no deviation or fasciculation.  MOTOR:  No atrophy, fasciculations or abnormal movements.  No pronator drift.   Upper Extremity:  Right  Left  Deltoid  5/5   5/5   Biceps  5/5   5/5   Triceps  5/5   5/5   Infraspinatus 5/5  5/5  Medial pectoralis 5/5  5/5  Wrist extensors  5/5   5/5   Wrist flexors  5/5   5/5   Finger extensors  5/5   5/5   Finger flexors  5/5   5/5   Dorsal interossei  5/5   5/5   Abductor pollicis  5/5   5/5   Tone (Ashworth scale)  0  0   Lower Extremity:  Right  Left  Hip flexors  5/5   5/5   Hip extensors  5/5   5/5   Adductor 5/5  5/5  Abductor 5/5  5/5  Knee flexors  5/5   5/5   Knee extensors  5/5   5/5  Dorsiflexors  5/5   5/5   Plantarflexors  5/5   5/5   Toe extensors  5/5   5/5   Toe flexors  5/5   5/5   Tone (Ashworth scale)  0  0   MSRs:  Right        Left                  brachioradialis 2+  2+  biceps 2+  2+  triceps 2+  2+  patellar 2+  2+  ankle jerk 1+  1+  Hoffman no  no  plantar response down  down   SENSORY:  Vibration is reduced to ~20% at the great toe bilaterally, temperature is mildly reduced over the dorsum of the feet.  Pin prick is intact throughout.  There is mild sway with Rhomberg testing. Sensation to all modalities is intact in the hands   COORDINATION/GAIT: Normal finger-to- nose-finger and heel-to-shin.  Intact rapid alternating movements bilaterally.  Gait narrow based and stable. Stressed gait is intact.  He is unsteady with tandem gait  IMPRESSION: Bilateral feet pain due to overlap of arthritis and diabetic neuropathy. He seems to be most bothered by chronic pain involving the toes described as achy, soreness, and throbbing which is suggestive of primary musculoskeletal pain, such as arthritis.    He denies painful tingling, burning, or stabbing pain making neuropathic pain less likely.  I offered electrodiagnostic, but given that his clinical symptomology is not consistent with neuropathy being the primary cause of his pain, he opted not to proceed with testing.  He can try tylenol and NSAIDs for pain and if no improvement follow-up with PCP.    I do think he has mild diabetic neuropathy in the feet based on his exam, however, he does not seem to be symptomatic from this. Patient education to keep blood sugars well-controlled and take extra caution on uneven ground as neuropathy can put his as risk for falls.     Return to clinic as needed   Thank you for allowing me to participate in patient's care.  If I can answer any additional questions, I would be pleased to do so.    Sincerely,    Donika K. Posey Pronto, DO

## 2018-12-20 NOTE — Patient Instructions (Addendum)
The type of pain you are describing (achy, throbbing, soreness) in the toes is more suggestive of arthritic pain.  You may try tylenol or ibuprofen and see if that provides some relief.  Recommend follow-up with your primary care doctor for arthritic pain.  If you develop numbness, tingling, or burning pain in the feet, please let me know and I am happy to do additional testing or trial of medication for nerve pain.    Follow-up as needed

## 2018-12-23 ENCOUNTER — Other Ambulatory Visit (HOSPITAL_COMMUNITY): Payer: Medicare Other

## 2018-12-31 ENCOUNTER — Other Ambulatory Visit: Payer: Self-pay

## 2018-12-31 ENCOUNTER — Ambulatory Visit (HOSPITAL_COMMUNITY): Payer: Medicare Other | Attending: Cardiovascular Disease

## 2018-12-31 DIAGNOSIS — I4819 Other persistent atrial fibrillation: Secondary | ICD-10-CM | POA: Insufficient documentation

## 2018-12-31 DIAGNOSIS — E782 Mixed hyperlipidemia: Secondary | ICD-10-CM | POA: Insufficient documentation

## 2018-12-31 DIAGNOSIS — I25810 Atherosclerosis of coronary artery bypass graft(s) without angina pectoris: Secondary | ICD-10-CM | POA: Diagnosis not present

## 2018-12-31 DIAGNOSIS — E1351 Other specified diabetes mellitus with diabetic peripheral angiopathy without gangrene: Secondary | ICD-10-CM | POA: Insufficient documentation

## 2018-12-31 DIAGNOSIS — I10 Essential (primary) hypertension: Secondary | ICD-10-CM

## 2018-12-31 DIAGNOSIS — IMO0002 Reserved for concepts with insufficient information to code with codable children: Secondary | ICD-10-CM

## 2018-12-31 DIAGNOSIS — E1365 Other specified diabetes mellitus with hyperglycemia: Secondary | ICD-10-CM | POA: Insufficient documentation

## 2019-01-13 ENCOUNTER — Telehealth: Payer: Self-pay | Admitting: Cardiology

## 2019-01-13 NOTE — Telephone Encounter (Signed)
New Message    Pt is calling for results     Please call back

## 2019-01-13 NOTE — Telephone Encounter (Signed)
This study demonstrates: Echo showed normal LV function. He has calcification of the aortic and mitral valve annulus but valves are working OK.  Medication changes / Follow up studies / Other recommendations:  None  Please send results to the PCP: Binnie Rail, MD  Peter Martinique, MD 12/31/2018 5:41 PM   PT AWARE OF ECHO RESULTS .Adonis Housekeeper

## 2019-03-13 ENCOUNTER — Other Ambulatory Visit: Payer: Self-pay | Admitting: Internal Medicine

## 2019-04-22 NOTE — Patient Instructions (Addendum)
  Blood work was ordered.   ° ° °Medications reviewed and updated.  Changes include :   none ° ° ° °Please followup in 6 months ° ° °

## 2019-04-22 NOTE — Progress Notes (Signed)
Subjective:    Patient ID: Bryce Maldonado, male    DOB: 08-16-1940, 79 y.o.   MRN: [id number]  HPI The patient is here for follow up of their chronic medical problems, including hypertension, diabetes, hyperlipidemia, history of gout and anxiety.  He is taking all of his medications as prescribed.     He is walking regularly.  He is walking more than he was when he was here last.  He did purchase new sneakers, which has improved his toe pain.  He does not think that the gabapentin helped and he is no longer taking it.  Medications and allergies reviewed with patient and updated if appropriate.  Patient Active Problem List   Diagnosis Date Noted  . Neuropathy 04/03/2018  . Mild aortic stenosis 10/02/2017  . Hypertension 09/27/2016  . Anxiety 03/27/2016  . SVT (supraventricular tachycardia) ([place]) 12/04/2014  . Typical atrial flutter ([place]) 11/03/2014  . PAF (paroxysmal atrial fibrillation) ([place]) 08/13/2014  . DM (diabetes mellitus), secondary, uncontrolled, with peripheral vascular complications ([place]) [id number]  . History of gout 03/04/2009  . ATOPIC RHINITIS 03/04/2009  . PROSTATE CANCER, HX OF 03/04/2009  . Mixed hyperlipidemia 08/06/2006  . Coronary atherosclerosis 08/06/2006    Current Outpatient Medications on File Prior to Visit  Medication Sig Dispense Refill  . allopurinol (ZYLOPRIM) 300 MG tablet TAKE 1 TABLET BY MOUTH EVERY DAY 90 tablet 0  . apixaban (ELIQUIS) 5 MG TABS tablet Take 1 tablet (5 mg total) by mouth 2 (two) times daily. 60 tablet 11  . ezetimibe (ZETIA) 10 MG tablet Take 1 tablet (10 mg total) by mouth at bedtime. NEEDS APPOINTMENT FOR FUTURE REFILLS 90 tablet 1  . gabapentin (NEURONTIN) 300 MG capsule Take 1 capsule (300 mg total) by mouth at bedtime. 30 capsule 3  . glucose blood (ONETOUCH VERIO) test strip 1 each by Other route 2 (two) times daily. Use to check  Blood sugars twice a day Dx E11.9 100 each 3  . JARDIANCE 25 MG TABS tablet TAKE 1  TABLET BY MOUTH DAILY. 90 tablet 1  . losartan (COZAAR) 25 MG tablet TAKE 1 TABLET(25 MG) BY MOUTH DAILY 90 tablet 1  . Melatonin 5 MG TABS Take 5 mg by mouth at bedtime.    . metFORMIN (GLUCOPHAGE) 500 MG tablet Take 2 tablets (1,000 mg total) by mouth 2 (two) times daily with a meal. 360 tablet 1  . Multiple Vitamins-Minerals (CENTRUM PO) Take 1 tablet by mouth daily.     Glory Rosebush DELICA LANCETS 99991111 MISC Use to help check blood sugars twice a day Dx E11.9 100 each 3  . rosuvastatin (CRESTOR) 10 MG tablet Take 1 tablet (10 mg total) by mouth at bedtime. KEEP OV. 90 tablet 0  . venlafaxine XR (EFFEXOR-XR) 75 MG 24 hr capsule TAKE 1 CAPSULE(75 MG) BY MOUTH DAILY 90 capsule 1   No current facility-administered medications on file prior to visit.    Past Medical History:  Diagnosis Date  . Anemia   . BPH (benign prostatic hyperplasia)    Elevated PSA  . Childhood asthma   . Coronary artery disease    post CABG x4 -- in 1995 -- due to three-vessel coronary artery disease  . Diverticular disease   . Dyslipidemia   . First degree AV block   . Gout   . Osteoarthritis    "everywhere" (12/04/2014)  . Prostate cancer [facility]) 2012   Dr Karsten Ro  . Spinal stenosis 2004   L4 radiculopathy, herniated  nucleus pulposus L4-5  . Tinnitus   . Type II diabetes mellitus ([place])   . Typical atrial flutter [facility])    s/p ablation    Past Surgical History:  Procedure Laterality Date  . APPENDECTOMY  1943  . ATRIAL FLUTTER ABLATION  12/04/2014  . BACK SURGERY    . CARDIAC CATHETERIZATION  02/10/2010   Est. EF of 60-65% -- Severe three-vessel obstructive atherosclerotic coronary artery disease -- All grafts were patent including left internal mammary artery graft to left anterior descending coronary artery, saphenous vein graft to the diagonal, saphenous vein graft to obtuse marginal vessel, and saphenous vein graft to the posterior descending coronary artery -- Normal left ventricular function   .  COLONOSCOPY     negative X3; Dr Carlean Purl  . CORONARY ARTERY BYPASS GRAFT  10/21/1993   x4 -- using the LIM artery graft to the LAD artery, with saphenous vein grafts to the diagonal branch of the LAD, OM branch the left circumflex coronary artery, and the posterior descending branch of the RCA -- Est. EF of 65%-- Surgeon: Gaye Pollack, M.D.              . ELECTROPHYSIOLOGIC STUDY N/A 10/01/2014   CTI ablation by Dr Curt Bears  . ELECTROPHYSIOLOGIC STUDY N/A 12/04/2014   repeat CTI ablation by Dr Rayann Heman  . FLEXIBLE SIGMOIDOSCOPY  2005    external hemorrhoids  . JOINT REPLACEMENT    . MICRODISCECTOMY LUMBAR  2004   L4-5, Dr. Arnoldo Morale  . PROSTATE BIOPSY  10/2008   "had radiation tx for prostate cancer"  . SALIVARY STONE REMOVAL  2001  . TOTAL HIP ARTHROPLASTY Right November 2010  . TOTAL HIP ARTHROPLASTY Left 03/2010   Dr Alvan Dame    Social History   Socioeconomic History  . Marital status: Married    Spouse name: Not on file  . Number of children: 2  . Years of education: 56  . Highest education level: Not on file  Occupational History  . Occupation: retired  Tobacco Use  . Smoking status: Former Smoker    Packs/day: 2.50    Years: 15.00    Pack years: 37.50    Types: Cigarettes    Quit date: 01/30/1970    Years since quitting: 49.2  . Smokeless tobacco: Never Used  . Tobacco comment: smoked 1959-1972  Substance and Sexual Activity  . Alcohol use: Yes    Alcohol/week: 4.0 standard drinks    Types: 4 Standard drinks or equivalent per week    Comment: 11/42016 "nothing to drink since 07/25/2014"  . Drug use: No  . Sexual activity: Not Currently  Other Topics Concern  . Not on file  Social History Narrative   Pt lives in [place] with spouse. 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  Enjoys reading and walking.   Left handed   [place]   Social Determinants of Health   Financial Resource Strain:   . Difficulty of Paying Living Expenses:   Food Insecurity:   . Worried About  Charity fundraiser in the Last Year:   . Arboriculturist in the Last Year:   Transportation Needs:   . Film/video editor (Medical):   [name] Lack of Transportation (Non-Medical):   Physical Activity:   . Days of Exercise per Week:   . Minutes of Exercise per Session:   Stress:   . Feeling of Stress :   Social Connections:   . Frequency of Communication with Friends and Family:   .  Frequency of Social Gatherings with Friends and Family:   . Attends Religious Services:   . Active Member of Clubs or Organizations:   . Attends Archivist Meetings:   [name] Marital Status:     Family History  Problem Relation Age of Onset  . Diabetes Father   . Hypertension Father   . Lung cancer Father        smoker  . Heart failure Mother 81  . Hyperlipidemia Sister   . Coronary artery disease Sister   . Heart attack Paternal Aunt        X 2; both > 65  . Colon cancer Neg Hx   . Stroke Neg Hx     Review of Systems  Constitutional: Negative for chills and fever.  HENT: Positive for postnasal drip.   Respiratory: Positive for cough. Negative for shortness of breath and wheezing.   Cardiovascular: Negative for chest pain, palpitations and leg swelling.  Neurological: Negative for dizziness, light-headedness and headaches.  Hematological: Does not bruise/bleed easily.       Objective:   Vitals:   04/23/19 0927  BP: (!) 142/78  Pulse: 81  Resp: 16  Temp: 98.2 F (36.8 C)  SpO2: 99%   BP Readings from Last 3 Encounters:  04/23/19 (!) 142/78  12/20/18 120/82  12/09/18 (!) 156/76   Wt Readings from Last 3 Encounters:  04/23/19 199 lb 9.6 oz (90.5 kg)  12/20/18 208 lb (94.3 kg)  12/09/18 188 lb (85.3 kg)   Body mass index is 27.07 kg/m.   Physical Exam    Constitutional: Appears well-developed and well-nourished. No distress.  HENT:  Head: Normocephalic and atraumatic.  Neck: Neck supple. No tracheal deviation present. No thyromegaly present.  No cervical  lymphadenopathy Cardiovascular: Normal rate, regular rhythm and normal heart sounds.   2/6 systolic murmur heard. No carotid bruit .  No edema Pulmonary/Chest: Effort normal and breath sounds normal. No respiratory distress. No has no wheezes. No rales.  Skin: Skin is warm and dry. Not diaphoretic.  Psychiatric: Normal mood and affect. Behavior is normal.      Assessment & Plan:    See Problem List for Assessment and Plan of chronic medical problems.    This visit occurred during the SARS-CoV-2 public health emergency.  Safety protocols were in place, including screening questions prior to the visit, additional usage of staff PPE, and extensive cleaning of exam room while observing appropriate contact time as indicated for disinfecting solutions.

## 2019-04-23 ENCOUNTER — Encounter: Payer: Self-pay | Admitting: Internal Medicine

## 2019-04-23 ENCOUNTER — Ambulatory Visit: Payer: Medicare PPO | Admitting: Internal Medicine

## 2019-04-23 ENCOUNTER — Other Ambulatory Visit: Payer: Self-pay

## 2019-04-23 VITALS — BP 142/78 | HR 81 | Temp 98.2°F | Resp 16 | Ht 72.0 in | Wt 199.6 lb

## 2019-04-23 DIAGNOSIS — I480 Paroxysmal atrial fibrillation: Secondary | ICD-10-CM | POA: Diagnosis not present

## 2019-04-23 DIAGNOSIS — E1351 Other specified diabetes mellitus with diabetic peripheral angiopathy without gangrene: Secondary | ICD-10-CM

## 2019-04-23 DIAGNOSIS — I10 Essential (primary) hypertension: Secondary | ICD-10-CM

## 2019-04-23 DIAGNOSIS — F419 Anxiety disorder, unspecified: Secondary | ICD-10-CM

## 2019-04-23 DIAGNOSIS — I25810 Atherosclerosis of coronary artery bypass graft(s) without angina pectoris: Secondary | ICD-10-CM | POA: Diagnosis not present

## 2019-04-23 DIAGNOSIS — E782 Mixed hyperlipidemia: Secondary | ICD-10-CM | POA: Diagnosis not present

## 2019-04-23 DIAGNOSIS — Z8739 Personal history of other diseases of the musculoskeletal system and connective tissue: Secondary | ICD-10-CM | POA: Diagnosis not present

## 2019-04-23 DIAGNOSIS — E1365 Other specified diabetes mellitus with hyperglycemia: Secondary | ICD-10-CM | POA: Diagnosis not present

## 2019-04-23 DIAGNOSIS — M79676 Pain in unspecified toe(s): Secondary | ICD-10-CM | POA: Diagnosis not present

## 2019-04-23 DIAGNOSIS — IMO0002 Reserved for concepts with insufficient information to code with codable children: Secondary | ICD-10-CM

## 2019-04-23 LAB — CBC WITH DIFFERENTIAL/PLATELET
Basophils Absolute: 0.1 10*3/uL (ref 0.0–0.1)
Basophils Relative: 1.9 % (ref 0.0–3.0)
Eosinophils Absolute: 0.1 10*3/uL (ref 0.0–0.7)
Eosinophils Relative: 2.4 % (ref 0.0–5.0)
HCT: 46.9 % (ref 39.0–52.0)
Hemoglobin: 15.9 g/dL (ref 13.0–17.0)
Lymphocytes Relative: 19.4 % (ref 12.0–46.0)
Lymphs Abs: 1.2 10*3/uL (ref 0.7–4.0)
MCHC: 34 g/dL (ref 30.0–36.0)
MCV: 94.6 fl (ref 78.0–100.0)
Monocytes Absolute: 0.6 10*3/uL (ref 0.1–1.0)
Monocytes Relative: 10.6 % (ref 3.0–12.0)
Neutro Abs: 3.9 10*3/uL (ref 1.4–7.7)
Neutrophils Relative %: 65.7 % (ref 43.0–77.0)
Platelets: 249 10*3/uL (ref 150.0–400.0)
RBC: 4.95 Mil/uL (ref 4.22–5.81)
RDW: 13.6 % (ref 11.5–15.5)
WBC: 6 10*3/uL (ref 4.0–10.5)

## 2019-04-23 LAB — LIPID PANEL
Cholesterol: 132 mg/dL (ref 0–200)
HDL: 49.1 mg/dL (ref >39.00)
LDL Cholesterol: 61 mg/dL (ref 0–99)
NonHDL: 83.11
Total CHOL/HDL Ratio: 3
Triglycerides: 110 mg/dL (ref 0.0–149.0)
VLDL: 22 mg/dL (ref 0.0–40.0)

## 2019-04-23 LAB — COMPREHENSIVE METABOLIC PANEL
ALT: 16 U/L (ref 0–53)
AST: 15 U/L (ref 0–37)
Albumin: 4.8 g/dL (ref 3.5–5.2)
Alkaline Phosphatase: 53 U/L (ref 39–117)
BUN: 17 mg/dL (ref 6–23)
CO2: 30 mEq/L (ref 19–32)
Calcium: 10.2 mg/dL (ref 8.4–10.5)
Chloride: 100 mEq/L (ref 96–112)
Creatinine, Ser: 0.84 mg/dL (ref 0.40–1.50)
GFR: 88.48 mL/min (ref >60.00)
Glucose, Bld: 147 mg/dL — ABNORMAL HIGH (ref 70–99)
Potassium: 4.3 mEq/L (ref 3.5–5.1)
Sodium: 137 mEq/L (ref 135–145)
Total Bilirubin: 0.6 mg/dL (ref 0.2–1.2)
Total Protein: 7.9 g/dL (ref 6.0–8.3)

## 2019-04-23 LAB — HEMOGLOBIN A1C: Hgb A1c MFr Bld: 7 % — ABNORMAL HIGH (ref 4.6–6.5)

## 2019-04-23 NOTE — Assessment & Plan Note (Addendum)
Chronic Check lipid panel  Continue daily statin, Zetia Regular exercise and healthy diet encouraged

## 2019-04-23 NOTE — Assessment & Plan Note (Signed)
Pain in foot improved with wearing different sneakers that have more support in the heels-toe pain has improved Gabapentin did not help Unlikely neuropathy He will continue to wear the sneakers and change them frequently as he is walking regularly

## 2019-04-23 NOTE — Assessment & Plan Note (Signed)
Chronic Following with cardiology No concerning symptoms of angina Continue current medications Continue regular exercise Check CBC, CMP, A1c, lipid

## 2019-04-23 NOTE — Assessment & Plan Note (Signed)
Chronic No gout attack in over 30 years Continue allopurinol at current dose

## 2019-04-23 NOTE — Assessment & Plan Note (Signed)
Chronic, paroxysmal A. fib In sinus rhythm here today Rate controlled Asymptomatic Following with cardiology No abnormal bruising or bleeding with Eliquis CBC, CMP

## 2019-04-23 NOTE — Assessment & Plan Note (Signed)
BP Readings from Last 3 Encounters:  04/23/19 (!) 142/78  12/20/18 120/82  12/09/18 (!) 156/76   Chronic BP controlled Current regimen effective and well tolerated Continue current medications at current doses cmp

## 2019-04-23 NOTE — Assessment & Plan Note (Addendum)
Chronic He feels he is eating good and is exercising more than he has in the past He has lost weight Taking Metformin, Jardiance Check A1c today-we will adjust medications if necessary Continue diabetic diet and regular exercise Has eye appointment scheduled

## 2019-04-23 NOTE — Assessment & Plan Note (Signed)
Chronic Controlled, stable Continue current dose of medication Effexor 75 mg daily 

## 2019-04-27 ENCOUNTER — Other Ambulatory Visit: Payer: Self-pay | Admitting: Internal Medicine

## 2019-05-22 DIAGNOSIS — E119 Type 2 diabetes mellitus without complications: Secondary | ICD-10-CM | POA: Diagnosis not present

## 2019-05-22 DIAGNOSIS — H5212 Myopia, left eye: Secondary | ICD-10-CM | POA: Diagnosis not present

## 2019-05-22 DIAGNOSIS — H0102B Squamous blepharitis left eye, upper and lower eyelids: Secondary | ICD-10-CM | POA: Diagnosis not present

## 2019-05-22 DIAGNOSIS — H40013 Open angle with borderline findings, low risk, bilateral: Secondary | ICD-10-CM | POA: Diagnosis not present

## 2019-05-22 DIAGNOSIS — H5201 Hypermetropia, right eye: Secondary | ICD-10-CM | POA: Diagnosis not present

## 2019-05-22 DIAGNOSIS — H25813 Combined forms of age-related cataract, bilateral: Secondary | ICD-10-CM | POA: Diagnosis not present

## 2019-05-22 DIAGNOSIS — H04123 Dry eye syndrome of bilateral lacrimal glands: Secondary | ICD-10-CM | POA: Diagnosis not present

## 2019-05-22 DIAGNOSIS — H0102A Squamous blepharitis right eye, upper and lower eyelids: Secondary | ICD-10-CM | POA: Diagnosis not present

## 2019-05-22 DIAGNOSIS — H524 Presbyopia: Secondary | ICD-10-CM | POA: Diagnosis not present

## 2019-05-22 LAB — HM DIABETES EYE EXAM

## 2019-05-29 ENCOUNTER — Telehealth: Payer: Self-pay

## 2019-05-29 NOTE — Telephone Encounter (Signed)
   [facility] Pre-operative Risk Assessment    Request for surgical clearance:  1. What type of surgery is being performed? Tooth extraction   2. When is this surgery scheduled? TBD   3. What type of clearance is required (medical clearance vs. Pharmacy clearance to hold med vs. Both)? PHARMACY  4. Are there any medications that need to be held prior to surgery and how long? ELIQUIS   5. Practice name and name of physician performing surgery?    6. What is your office phone number [phone]   7.   What is your office fax number [phone]  8.   Anesthesia type (None, local, MAC, general) ? N/A   Crissie Reese 05/29/2019, 9:47 AM  _________________________________________________________________   (provider comments below)

## 2019-05-29 NOTE — Telephone Encounter (Signed)
   Primary Cardiologist: Peter Martinique, MD  Chart reviewed as part of pre-operative protocol coverage. Simple dental extractions are considered low risk procedures per guidelines and generally do not require any specific cardiac clearance. It is also generally accepted that for simple extractions and dental cleanings, there is no need to interrupt blood thinner therapy.   SBE prophylaxis is not required for the patient.  I will route this recommendation to the requesting party via Epic fax function and remove from pre-op pool.  Please call with questions.  Abigail Butts, PA-C 05/29/2019, 10:11 AM

## 2019-06-02 ENCOUNTER — Encounter: Payer: Self-pay | Admitting: Internal Medicine

## 2019-06-02 ENCOUNTER — Ambulatory Visit: Payer: Medicare PPO | Admitting: Internal Medicine

## 2019-06-02 ENCOUNTER — Other Ambulatory Visit: Payer: Self-pay

## 2019-06-02 DIAGNOSIS — B356 Tinea cruris: Secondary | ICD-10-CM | POA: Diagnosis not present

## 2019-06-02 MED ORDER — CICLOPIROX OLAMINE 0.77 % EX CREA: TOPICAL_CREAM | Freq: Two times a day (BID) | CUTANEOUS | 0 refills | Status: DC

## 2019-06-02 NOTE — Progress Notes (Signed)
Subjective:    Patient ID: Bryce Maldonado, male    DOB: March 13, 1940, 79 y.o.   MRN: [id number]  HPI The patient is here for an acute visit.   Rash in left inguinal rash.  It started about one week.  It does itch.  He put cortisone cream on it and it helped with the itching.  The rash has spread, but is still contained to the groin area.     No rash elsewhere.  No fever or chills.  There is no pain.    Medications and allergies reviewed with patient and updated if appropriate.  Patient Active Problem List   Diagnosis Date Noted  . Toe pain 04/03/2018  . Mild aortic stenosis 10/02/2017  . Hypertension 09/27/2016  . Anxiety 03/27/2016  . SVT (supraventricular tachycardia) ([place]) 12/04/2014  . Typical atrial flutter ([place]) 11/03/2014  . PAF (paroxysmal atrial fibrillation) ([place]) 08/13/2014  . DM (diabetes mellitus), secondary, uncontrolled, with peripheral vascular complications ([place]) [id number]  . History of gout 03/04/2009  . ATOPIC RHINITIS 03/04/2009  . PROSTATE CANCER, HX OF 03/04/2009  . Mixed hyperlipidemia 08/06/2006  . Coronary atherosclerosis 08/06/2006    Current Outpatient Medications on File Prior to Visit  Medication Sig Dispense Refill  . allopurinol (ZYLOPRIM) 300 MG tablet TAKE 1 TABLET BY MOUTH EVERY DAY 90 tablet 0  . apixaban (ELIQUIS) 5 MG TABS tablet Take 1 tablet (5 mg total) by mouth 2 (two) times daily. 60 tablet 11  . ezetimibe (ZETIA) 10 MG tablet Take 1 tablet (10 mg total) by mouth at bedtime. NEEDS APPOINTMENT FOR FUTURE REFILLS 90 tablet 1  . glucose blood (ONETOUCH VERIO) test strip 1 each by Other route 2 (two) times daily. Use to check  Blood sugars twice a day Dx E11.9 100 each 3  . JARDIANCE 25 MG TABS tablet TAKE 1 TABLET BY MOUTH DAILY. 90 tablet 1  . losartan (COZAAR) 25 MG tablet TAKE 1 TABLET(25 MG) BY MOUTH DAILY 90 tablet 1  . Melatonin 5 MG TABS Take 5 mg by mouth at bedtime.    . metFORMIN (GLUCOPHAGE) 500 MG tablet Take 2 tablets  (1,000 mg total) by mouth 2 (two) times daily with a meal. 360 tablet 1  . Multiple Vitamins-Minerals (CENTRUM PO) Take 1 tablet by mouth daily.     Glory Rosebush DELICA LANCETS 99991111 MISC Use to help check blood sugars twice a day Dx E11.9 100 each 3  . rosuvastatin (CRESTOR) 10 MG tablet Take 1 tablet (10 mg total) by mouth at bedtime. KEEP OV. 90 tablet 0  . venlafaxine XR (EFFEXOR-XR) 75 MG 24 hr capsule TAKE 1 CAPSULE(75 MG) BY MOUTH DAILY 90 capsule 1   No current facility-administered medications on file prior to visit.    Past Medical History:  Diagnosis Date  . Anemia   . BPH (benign prostatic hyperplasia)    Elevated PSA  . Childhood asthma   . Coronary artery disease    post CABG x4 -- in 1995 -- due to three-vessel coronary artery disease  . Diverticular disease   . Dyslipidemia   . First degree AV block   . Gout   . Osteoarthritis    "everywhere" (12/04/2014)  . Prostate cancer [facility]) 2012   Dr Karsten Ro  . Spinal stenosis 2004   L4 radiculopathy, herniated nucleus pulposus L4-5  . Tinnitus   . Type II diabetes mellitus ([place])   . Typical atrial flutter [facility])    s/p ablation  Past Surgical History:  Procedure Laterality Date  . APPENDECTOMY  1943  . ATRIAL FLUTTER ABLATION  12/04/2014  . BACK SURGERY    . CARDIAC CATHETERIZATION  02/10/2010   Est. EF of 60-65% -- Severe three-vessel obstructive atherosclerotic coronary artery disease -- All grafts were patent including left internal mammary artery graft to left anterior descending coronary artery, saphenous vein graft to the diagonal, saphenous vein graft to obtuse marginal vessel, and saphenous vein graft to the posterior descending coronary artery -- Normal left ventricular function   . COLONOSCOPY     negative X3; Dr Carlean Purl  . CORONARY ARTERY BYPASS GRAFT  10/21/1993   x4 -- using the LIM artery graft to the LAD artery, with saphenous vein grafts to the diagonal branch of the LAD, OM branch the left circumflex  coronary artery, and the posterior descending branch of the RCA -- Est. EF of 65%-- Surgeon: Gaye Pollack, M.D.              . ELECTROPHYSIOLOGIC STUDY N/A 10/01/2014   CTI ablation by Dr Curt Bears  . ELECTROPHYSIOLOGIC STUDY N/A 12/04/2014   repeat CTI ablation by Dr Rayann Heman  . FLEXIBLE SIGMOIDOSCOPY  2005    external hemorrhoids  . JOINT REPLACEMENT    . MICRODISCECTOMY LUMBAR  2004   L4-5, Dr. Arnoldo Morale  . PROSTATE BIOPSY  10/2008   "had radiation tx for prostate cancer"  . SALIVARY STONE REMOVAL  2001  . TOTAL HIP ARTHROPLASTY Right November 2010  . TOTAL HIP ARTHROPLASTY Left 03/2010   Dr Alvan Dame    Social History   Socioeconomic History  . Marital status: Married    Spouse name: Not on file  . Number of children: 2  . Years of education: 63  . Highest education level: Not on file  Occupational History  . Occupation: retired  Tobacco Use  . Smoking status: Former Smoker    Packs/day: 2.50    Years: 15.00    Pack years: 37.50    Types: Cigarettes    Quit date: 01/30/1970    Years since quitting: 49.3  . Smokeless tobacco: Never Used  . Tobacco comment: smoked 1959-1972  Substance and Sexual Activity  . Alcohol use: Yes    Alcohol/week: 4.0 standard drinks    Types: 4 Standard drinks or equivalent per week    Comment: 11/42016 "nothing to drink since 07/25/2014"  . Drug use: No  . Sexual activity: Not Currently  Other Topics Concern  . Not on file  Social History Narrative   Pt lives in [place] with spouse. 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  Enjoys reading and walking.   Left handed   [place]   Social Determinants of Health   Financial Resource Strain:   . Difficulty of Paying Living Expenses:   Food Insecurity:   . Worried About Charity fundraiser in the Last Year:   . Arboriculturist in the Last Year:   Transportation Needs:   . Film/video editor (Medical):   [name] Lack of Transportation (Non-Medical):   Physical Activity:   . Days of Exercise per  Week:   . Minutes of Exercise per Session:   Stress:   . Feeling of Stress :   Social Connections:   . Frequency of Communication with Friends and Family:   . Frequency of Social Gatherings with Friends and Family:   . Attends Religious Services:   . Active Member of Clubs or Organizations:   . Attends Club  or Organization Meetings:   [name] Marital Status:     Family History  Problem Relation Age of Onset  . Diabetes Father   . Hypertension Father   . Lung cancer Father        smoker  . Heart failure Mother 20  . Hyperlipidemia Sister   . Coronary artery disease Sister   . Heart attack Paternal Aunt        X 2; both > 65  . Colon cancer Neg Hx   . Stroke Neg Hx     Review of Systems  Constitutional: Negative for chills and fever.  Skin: Positive for rash.       Objective:   Vitals:   06/02/19 1515  BP: 124/72  Pulse: 96  Resp: 16  Temp: 98.2 F (36.8 C)  SpO2: 97%   BP Readings from Last 3 Encounters:  06/02/19 124/72  04/23/19 (!) 142/78  12/20/18 120/82   Wt Readings from Last 3 Encounters:  06/02/19 197 lb 9.6 oz (89.6 kg)  04/23/19 199 lb 9.6 oz (90.5 kg)  12/20/18 208 lb (94.3 kg)   Body mass index is 26.8 kg/m.   Physical Exam Constitutional:      General: He is not in acute distress.    Appearance: Normal appearance. He is not ill-appearing.  HENT:     Head: Normocephalic.  Skin:    Findings: Rash (maculopapular erythematous, flat rash in left groin with several satellite lesion, no blisters or open wounds) present.  Neurological:     Mental Status: He is alert.            Assessment & Plan:    See Problem List for Assessment and Plan of chronic medical problems.    This visit occurred during the SARS-CoV-2 public health emergency.  Safety protocols were in place, including screening questions prior to the visit, additional usage of staff PPE, and extensive cleaning of exam room while observing appropriate contact time as indicated  for disinfecting solutions.

## 2019-06-02 NOTE — Assessment & Plan Note (Signed)
Acute Start ciclopirox cream BID - advised to take a few days after rash has resolved - about 14 days Call if no improvement

## 2019-06-02 NOTE — Patient Instructions (Signed)
Use the cream twice daily for at least 2 weeks.     Jock Itch  Jock itch is an itchy rash in the groin and upper thigh area. It is a skin infection that is caused by a type of germ that lives in dark, damp places (fungus). The rash usually goes away in 2-3 weeks with treatment. Follow these instructions at home: Skin care  Use skin creams, ointments, or powders exactly as told by your doctor.  Wear loose-fitting clothes. Clothes should not rub against your groin area. Men should wear boxer shorts or loose-fitting underwear.  Keep your groin area clean and dry. ? Change your underwear every day. ? Change out of wet bathing suits as soon as you can. ? After bathing, use a separate towel to dry your groin area. Dry the area gently and completely.  Avoid hot baths and showers. Hot water can make itching worse.  Do not scratch the area. General instructions  Take and apply over-the-counter and prescription medicines only as told by your doctor.  Do not share towels or clothing with other people.  Wash your hands often with soap and water, especially after touching your groin area. If you do not have soap and water, use alcohol-based hand sanitizer. Contact a doctor if:  Your rash: ? Gets worse. ? Does not get better after 2 weeks of treatment. ? Spreads. ? Comes back after treatment is done.  You have any of the following: ? A fever. ? New or worsening redness, swelling, or pain around your rash. ? Fluid, blood, or pus coming from your rash. Summary  Jock itch is an itchy rash. It affects the groin and upper thigh area.  Jock itch usually goes away in 2-3 weeks with treatment.  Keep your groin area clean and dry. This information is not intended to replace advice given to you by your health care provider. Make sure you discuss any questions you have with your health care provider. Document Revised: 12/29/2016 Document Reviewed: 12/27/2016 Elsevier Patient Education  2020  [employer].

## 2019-06-04 ENCOUNTER — Encounter: Payer: Self-pay | Admitting: Internal Medicine

## 2019-06-11 ENCOUNTER — Other Ambulatory Visit: Payer: Self-pay | Admitting: Internal Medicine

## 2019-06-13 ENCOUNTER — Other Ambulatory Visit: Payer: Self-pay

## 2019-06-13 MED ORDER — ROSUVASTATIN CALCIUM 10 MG PO TABS: 10.0000 mg | ORAL_TABLET | Freq: Every day | ORAL | 0 refills | Status: DC

## 2019-06-19 ENCOUNTER — Other Ambulatory Visit: Payer: Self-pay | Admitting: Internal Medicine

## 2019-06-20 DIAGNOSIS — Z96643 Presence of artificial hip joint, bilateral: Secondary | ICD-10-CM | POA: Diagnosis not present

## 2019-06-20 DIAGNOSIS — Z96642 Presence of left artificial hip joint: Secondary | ICD-10-CM | POA: Diagnosis not present

## 2019-06-20 DIAGNOSIS — Z471 Aftercare following joint replacement surgery: Secondary | ICD-10-CM | POA: Diagnosis not present

## 2019-06-20 DIAGNOSIS — M25562 Pain in left knee: Secondary | ICD-10-CM | POA: Diagnosis not present

## 2019-06-20 DIAGNOSIS — Z96641 Presence of right artificial hip joint: Secondary | ICD-10-CM | POA: Diagnosis not present

## 2019-06-26 DIAGNOSIS — H40013 Open angle with borderline findings, low risk, bilateral: Secondary | ICD-10-CM | POA: Diagnosis not present

## 2019-09-09 ENCOUNTER — Other Ambulatory Visit: Payer: Self-pay | Admitting: Cardiology

## 2019-09-09 MED ORDER — EZETIMIBE 10 MG PO TABS: 10.0000 mg | ORAL_TABLET | Freq: Every day | ORAL | 0 refills | Status: DC

## 2019-09-25 DIAGNOSIS — H40013 Open angle with borderline findings, low risk, bilateral: Secondary | ICD-10-CM | POA: Diagnosis not present

## 2019-10-26 NOTE — Progress Notes (Signed)
Subjective:    Patient ID: Bryce Maldonado, male    DOB: [birth year], 79 y.o.   MRN: [id number]  HPI He is here for a physical exam.   He denies changes in his health and overall feels good.   Medications and allergies reviewed with patient and updated if appropriate.  Patient Active Problem List   Diagnosis Date Noted  . Tinea cruris 06/02/2019  . Toe pain 04/03/2018  . Mild aortic stenosis 10/02/2017  . Hypertension 09/27/2016  . Anxiety 03/27/2016  . SVT (supraventricular tachycardia) ([place]) 12/04/2014  . Typical atrial flutter ([place]) 11/03/2014  . PAF (paroxysmal atrial fibrillation) ([place]) 08/13/2014  . DM (diabetes mellitus), secondary, uncontrolled, with peripheral vascular complications ([place]) 70/62/3762  . History of gout 03/04/2009  . ATOPIC RHINITIS 03/04/2009  . PROSTATE CANCER, HX OF 03/04/2009  . Mixed hyperlipidemia 08/06/2006  . Coronary atherosclerosis 08/06/2006    Current Outpatient Medications on File Prior to Visit  Medication Sig Dispense Refill  . allopurinol (ZYLOPRIM) 300 MG tablet TAKE 1 TABLET BY MOUTH EVERY DAY 90 tablet 1  . apixaban (ELIQUIS) 5 MG TABS tablet Take 1 tablet (5 mg total) by mouth 2 (two) times daily. 60 tablet 11  . ezetimibe (ZETIA) 10 MG tablet Take 1 tablet (10 mg total) by mouth at bedtime. NEEDS APPOINTMENT FOR FUTURE REFILLS 90 tablet 0  . glucose blood (ONETOUCH VERIO) test strip 1 each by Other route 2 (two) times daily. Use to check  Blood sugars twice a day Dx E11.9 100 each 3  . JARDIANCE 25 MG TABS tablet TAKE 1 TABLET BY MOUTH DAILY. 90 tablet 1  . losartan (COZAAR) 25 MG tablet TAKE 1 TABLET(25 MG) BY MOUTH DAILY 90 tablet 1  . Melatonin 5 MG TABS Take 5 mg by mouth at bedtime.    . metFORMIN (GLUCOPHAGE) 500 MG tablet TAKE 2 TABLETS(1000 MG) BY MOUTH TWICE DAILY WITH A MEAL 360 tablet 1  . Multiple Vitamins-Minerals (CENTRUM PO) Take 1 tablet by mouth daily.     Glory Rosebush DELICA LANCETS 83T MISC Use to help check  blood sugars twice a day Dx E11.9 100 each 3  . rosuvastatin (CRESTOR) 10 MG tablet TAKE 1 TABLET BY MOUTH AT BEDTIME TIME. 90 tablet 0  . venlafaxine XR (EFFEXOR-XR) 75 MG 24 hr capsule TAKE 1 CAPSULE(75 MG) BY MOUTH DAILY 90 capsule 1   No current facility-administered medications on file prior to visit.    Past Medical History:  Diagnosis Date  . Anemia   . BPH (benign prostatic hyperplasia)    Elevated PSA  . Childhood asthma   . Coronary artery disease    post CABG x4 -- in 1995 -- due to three-vessel coronary artery disease  . Diverticular disease   . Dyslipidemia   . First degree AV block   . Gout   . Osteoarthritis    "everywhere" (12/04/2014)  . Prostate cancer [facility]) 2012   Dr Karsten Ro  . Spinal stenosis 2004   L4 radiculopathy, herniated nucleus pulposus L4-5  . Tinnitus   . Type II diabetes mellitus ([place])   . Typical atrial flutter [facility])    s/p ablation    Past Surgical History:  Procedure Laterality Date  . APPENDECTOMY  1943  . ATRIAL FLUTTER ABLATION  12/04/2014  . BACK SURGERY    . CARDIAC CATHETERIZATION  02/10/2010   Est. EF of 60-65% -- Severe three-vessel obstructive atherosclerotic coronary artery disease -- All grafts were patent including left internal  mammary artery graft to left anterior descending coronary artery, saphenous vein graft to the diagonal, saphenous vein graft to obtuse marginal vessel, and saphenous vein graft to the posterior descending coronary artery -- Normal left ventricular function   . COLONOSCOPY     negative X3; Dr Carlean Purl  . CORONARY ARTERY BYPASS GRAFT  10/21/1993   x4 -- using the LIM artery graft to the LAD artery, with saphenous vein grafts to the diagonal branch of the LAD, OM branch the left circumflex coronary artery, and the posterior descending branch of the RCA -- Est. EF of 65%-- Surgeon: Gaye Pollack, M.D.              . ELECTROPHYSIOLOGIC STUDY N/A 10/01/2014   CTI ablation by Dr Curt Bears  . ELECTROPHYSIOLOGIC  STUDY N/A 12/04/2014   repeat CTI ablation by Dr Rayann Heman  . FLEXIBLE SIGMOIDOSCOPY  2005    external hemorrhoids  . JOINT REPLACEMENT    . MICRODISCECTOMY LUMBAR  2004   L4-5, Dr. Arnoldo Morale  . PROSTATE BIOPSY  10/2008   "had radiation tx for prostate cancer"  . SALIVARY STONE REMOVAL  2001  . TOTAL HIP ARTHROPLASTY Right November 2010  . TOTAL HIP ARTHROPLASTY Left 03/2010   Dr Alvan Dame    Social History   Socioeconomic History  . Marital status: Married    Spouse name: Not on file  . Number of children: 2  . Years of education: 36  . Highest education level: Not on file  Occupational History  . Occupation: retired  Tobacco Use  . Smoking status: Former Smoker    Packs/day: 2.50    Years: 15.00    Pack years: 37.50    Types: Cigarettes    Quit date: 01/30/1970    Years since quitting: 49.7  . Smokeless tobacco: Never Used  . Tobacco comment: smoked 1959-1972  Vaping Use  . Vaping Use: Never used  Substance and Sexual Activity  . Alcohol use: Yes    Alcohol/week: 4.0 standard drinks    Types: 4 Standard drinks or equivalent per week    Comment: 11/42016 "nothing to drink since 07/25/2014"  . Drug use: No  . Sexual activity: Not Currently  Other Topics Concern  . Not on file  Social History Narrative   Pt lives in [place] with spouse. 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  Enjoys reading and walking.   Left handed   [place]   Social Determinants of Health   Financial Resource Strain:   . Difficulty of Paying Living Expenses: Not on file  Food Insecurity:   . Worried About Charity fundraiser in the Last Year: Not on file  . Ran Out of Food in the Last Year: Not on file  Transportation Needs:   . Lack of Transportation (Medical): Not on file  . Lack of Transportation (Non-Medical): Not on file  Physical Activity:   . Days of Exercise per Week: Not on file  . Minutes of Exercise per Session: Not on file  Stress:   . Feeling of Stress : Not on file  Social  Connections:   . Frequency of Communication with Friends and Family: Not on file  . Frequency of Social Gatherings with Friends and Family: Not on file  . Attends Religious Services: Not on file  . Active Member of Clubs or Organizations: Not on file  . Attends Archivist Meetings: Not on file  . Marital Status: Not on file    Family History  Problem  Relation Age of Onset  . Diabetes Father   . Hypertension Father   . Lung cancer Father        smoker  . Heart failure Mother 93  . Hyperlipidemia Sister   . Coronary artery disease Sister   . Heart attack Paternal Aunt        X 2; both > 65  . Colon cancer Neg Hx   . Stroke Neg Hx     Review of Systems  Constitutional: Negative for chills and fever.  Eyes: Negative for visual disturbance.  Respiratory: Negative for cough, shortness of breath and wheezing.   Cardiovascular: Negative for chest pain, palpitations and leg swelling.  Gastrointestinal: Negative for abdominal pain, blood in stool, constipation, diarrhea and nausea.       No gerd  Genitourinary: Negative for difficulty urinating, dysuria and hematuria.  Musculoskeletal: Positive for arthralgias (chronic).  Skin: Positive for color change. Negative for rash.  Neurological: Positive for numbness (occ tingling - neuro said it was not neuropathy). Negative for dizziness, light-headedness and headaches.  Psychiatric/Behavioral: Negative for dysphoric mood. The patient is not nervous/anxious.        Objective:   Vitals:   10/27/19 0818  BP: 128/82  Pulse: 71  Temp: 97.6 F (36.4 C)  SpO2: 96%   Filed Weights   10/27/19 0818  Weight: 197 lb (89.4 kg)   Body mass index is 26.72 kg/m.  BP Readings from Last 3 Encounters:  10/27/19 128/82  06/02/19 124/72  04/23/19 (!) 142/78    Wt Readings from Last 3 Encounters:  10/27/19 197 lb (89.4 kg)  06/02/19 197 lb 9.6 oz (89.6 kg)  04/23/19 199 lb 9.6 oz (90.5 kg)     Physical Exam Constitutional:  He appears well-developed and well-nourished. No distress.  HENT:  Head: Normocephalic and atraumatic.  Right Ear: External ear normal.  Left Ear: External ear normal.  Mouth/Throat: Oropharynx is clear and moist.  Normal ear canals and TM b/l  Eyes: Conjunctivae and EOM are normal.  Neck: Neck supple. No tracheal deviation present. No thyromegaly present.  No carotid bruit  Cardiovascular: Normal rate, regular rhythm, normal heart sounds and intact distal pulses.   2/6 systolic murmur heard. Pulmonary/Chest: Effort normal and breath sounds normal. No respiratory distress. He has no wheezes. He has no rales.  Abdominal: Soft. He exhibits no distension. There is no tenderness.  Genitourinary: deferred  Musculoskeletal: He exhibits no edema.  Lymphadenopathy:   He has no cervical adenopathy.  Skin: Skin is warm and dry. He is not diaphoretic.  Psychiatric: He has a normal mood and affect. His behavior is normal.         Assessment & Plan:   Physical exam: Screening blood work  ordered Immunizations  Flu vaccine today, discussed covid booster, shingrix Colonoscopy   N/a due to age Eye exams    Up to date  Exercise    walking Weight   Advised weight loss Substance abuse   none  See Problem List for Assessment and Plan of chronic medical problems.   This visit occurred during the SARS-CoV-2 public health emergency.  Safety protocols were in place, including screening questions prior to the visit, additional usage of staff PPE, and extensive cleaning of exam room while observing appropriate contact time as indicated for disinfecting solutions.

## 2019-10-26 NOTE — Patient Instructions (Addendum)
Blood work was ordered.    All other Health Maintenance issues reviewed.   All recommended immunizations and age-appropriate screenings are up-to-date or discussed.  Flu immunization administered today.    Medications reviewed and updated.  Changes include :   effexor to 37.5 mg daily -- take for one month or so and then stop if you are doing ok  Your prescription(s) have been submitted to your pharmacy. Please take as directed and contact our office if you believe you are having problem(s) with the medication(s).    Please followup in 6 months    Health Maintenance, Male Adopting a healthy lifestyle and getting preventive care are important in promoting health and wellness. Ask your health care provider about:  The right schedule for you to have regular tests and exams.  Things you can do on your own to prevent diseases and keep yourself healthy. What should I know about diet, weight, and exercise? Eat a healthy diet   Eat a diet that includes plenty of vegetables, fruits, low-fat dairy products, and lean protein.  Do not eat a lot of foods that are high in solid fats, added sugars, or sodium. Maintain a healthy weight Body mass index (BMI) is a measurement that can be used to identify possible weight problems. It estimates body fat based on height and weight. Your health care provider can help determine your BMI and help you achieve or maintain a healthy weight. Get regular exercise Get regular exercise. This is one of the most important things you can do for your health. Most adults should:  Exercise for at least 150 minutes each week. The exercise should increase your heart rate and make you sweat (moderate-intensity exercise).  Do strengthening exercises at least twice a week. This is in addition to the moderate-intensity exercise.  Spend less time sitting. Even light physical activity can be beneficial. Watch cholesterol and blood lipids Have your blood tested for  lipids and cholesterol at 78 years of age, then have this test every 5 years. You may need to have your cholesterol levels checked more often if:  Your lipid or cholesterol levels are high.  You are older than 78 years of age.  You are at high risk for heart disease. What should I know about cancer screening? Many types of cancers can be detected early and may often be prevented. Depending on your health history and family history, you may need to have cancer screening at various ages. This may include screening for:  Colorectal cancer.  Prostate cancer.  Skin cancer.  Lung cancer. What should I know about heart disease, diabetes, and high blood pressure? Blood pressure and heart disease  High blood pressure causes heart disease and increases the risk of stroke. This is more likely to develop in people who have high blood pressure readings, are of African descent, or are overweight.  Talk with your health care provider about your target blood pressure readings.  Have your blood pressure checked: ? Every 3-5 years if you are 61-74 years of age. ? Every year if you are 57 years old or older.  If you are between the ages of 45 and 22 and are a current or former smoker, ask your health care provider if you should have a one-time screening for abdominal aortic aneurysm (AAA). Diabetes Have regular diabetes screenings. This checks your fasting blood sugar level. Have the screening done:  Once every three years after age 71 if you are at a normal weight and have  a low risk for diabetes.  More often and at a younger age if you are overweight or have a high risk for diabetes. What should I know about preventing infection? Hepatitis B If you have a higher risk for hepatitis B, you should be screened for this virus. Talk with your health care provider to find out if you are at risk for hepatitis B infection. Hepatitis C Blood testing is recommended for:  Everyone born from [birth year] through  1965.  Anyone with known risk factors for hepatitis C. Sexually transmitted infections (STIs)  You should be screened each year for STIs, including gonorrhea and chlamydia, if: ? You are sexually active and are younger than 78 years of age. ? You are older than 78 years of age and your health care provider tells you that you are at risk for this type of infection. ? Your sexual activity has changed since you were last screened, and you are at increased risk for chlamydia or gonorrhea. Ask your health care provider if you are at risk.  Ask your health care provider about whether you are at high risk for HIV. Your health care provider may recommend a prescription medicine to help prevent HIV infection. If you choose to take medicine to prevent HIV, you should first get tested for HIV. You should then be tested every 3 months for as long as you are taking the medicine. Follow these instructions at home: Lifestyle  Do not use any products that contain nicotine or tobacco, such as cigarettes, e-cigarettes, and chewing tobacco. If you need help quitting, ask your health care provider.  Do not use street drugs.  Do not share needles.  Ask your health care provider for help if you need support or information about quitting drugs. Alcohol use  Do not drink alcohol if your health care provider tells you not to drink.  If you drink alcohol: ? Limit how much you have to 0-2 drinks a day. ? Be aware of how much alcohol is in your drink. In the [place] one drink equals one 12 oz bottle of beer (355 mL), one 5 oz glass of wine (148 mL), or one 1 oz glass of hard liquor (44 mL). General instructions  Schedule regular health, dental, and eye exams.  Stay current with your vaccines.  Tell your health care provider if: ? You often feel depressed. ? You have ever been abused or do not feel safe at home. Summary  Adopting a healthy lifestyle and getting preventive care are important in promoting  health and wellness.  Follow your health care provider's instructions about healthy diet, exercising, and getting tested or screened for diseases.  Follow your health care provider's instructions on monitoring your cholesterol and blood pressure. This information is not intended to replace advice given to you by your health care provider. Make sure you discuss any questions you have with your health care provider. Document Revised: 01/09/2018 Document Reviewed: 01/09/2018 Elsevier Patient Education  2020 [employer].

## 2019-10-27 ENCOUNTER — Ambulatory Visit (INDEPENDENT_AMBULATORY_CARE_PROVIDER_SITE_OTHER): Payer: Medicare PPO | Admitting: Internal Medicine

## 2019-10-27 ENCOUNTER — Encounter: Payer: Self-pay | Admitting: Internal Medicine

## 2019-10-27 ENCOUNTER — Other Ambulatory Visit: Payer: Self-pay

## 2019-10-27 VITALS — BP 128/82 | HR 71 | Temp 97.6°F | Wt 197.0 lb

## 2019-10-27 DIAGNOSIS — Z8739 Personal history of other diseases of the musculoskeletal system and connective tissue: Secondary | ICD-10-CM

## 2019-10-27 DIAGNOSIS — I10 Essential (primary) hypertension: Secondary | ICD-10-CM

## 2019-10-27 DIAGNOSIS — E782 Mixed hyperlipidemia: Secondary | ICD-10-CM | POA: Diagnosis not present

## 2019-10-27 DIAGNOSIS — Z8546 Personal history of malignant neoplasm of prostate: Secondary | ICD-10-CM | POA: Diagnosis not present

## 2019-10-27 DIAGNOSIS — Z23 Encounter for immunization: Secondary | ICD-10-CM

## 2019-10-27 DIAGNOSIS — E1351 Other specified diabetes mellitus with diabetic peripheral angiopathy without gangrene: Secondary | ICD-10-CM | POA: Diagnosis not present

## 2019-10-27 DIAGNOSIS — Z0001 Encounter for general adult medical examination with abnormal findings: Secondary | ICD-10-CM | POA: Diagnosis not present

## 2019-10-27 DIAGNOSIS — E1365 Other specified diabetes mellitus with hyperglycemia: Secondary | ICD-10-CM

## 2019-10-27 DIAGNOSIS — I350 Nonrheumatic aortic (valve) stenosis: Secondary | ICD-10-CM

## 2019-10-27 DIAGNOSIS — IMO0002 Reserved for concepts with insufficient information to code with codable children: Secondary | ICD-10-CM

## 2019-10-27 DIAGNOSIS — I25810 Atherosclerosis of coronary artery bypass graft(s) without angina pectoris: Secondary | ICD-10-CM | POA: Diagnosis not present

## 2019-10-27 DIAGNOSIS — F419 Anxiety disorder, unspecified: Secondary | ICD-10-CM | POA: Diagnosis not present

## 2019-10-27 MED ORDER — VENLAFAXINE HCL ER 37.5 MG PO CP24: 37.5000 mg | ORAL_CAPSULE | Freq: Every day | ORAL | 5 refills | Status: DC

## 2019-10-27 MED ORDER — EMPAGLIFLOZIN 25 MG PO TABS
25.0000 mg | ORAL_TABLET | Freq: Every day | ORAL | 1 refills | Status: DC
Start: 2019-10-27 — End: 2020-06-29

## 2019-10-27 MED ORDER — ALLOPURINOL 300 MG PO TABS
300.0000 mg | ORAL_TABLET | Freq: Every day | ORAL | 1 refills | Status: DC
Start: 2019-10-27 — End: 2019-10-28

## 2019-10-27 MED ORDER — METFORMIN HCL 500 MG PO TABS
ORAL_TABLET | ORAL | 1 refills | Status: DC
Start: 2019-10-27 — End: 2020-06-17

## 2019-10-27 NOTE — Assessment & Plan Note (Signed)
Chronic See cardiology No symptoms of angina Continue current meds

## 2019-10-27 NOTE — Assessment & Plan Note (Signed)
Chronic No recent gout symptoms Continue allopurinol 300 mg Check uric acid

## 2019-10-27 NOTE — Assessment & Plan Note (Signed)
Chronic Check lipid panel  Continue crestor 10 mg daily Regular exercise and healthy diet encouraged  

## 2019-10-27 NOTE — Assessment & Plan Note (Addendum)
Chronic Controlled, stable ? Still need medication - started when there was a lot going on at the same time Would like to try to come off Decrease dose to 37.5 mg daily -- after 3-4 weeks an try to stop it Advised if he has any side effects while coming off medication we can taper slower - advised he contact me if this occurs

## 2019-10-27 NOTE — Assessment & Plan Note (Signed)
Chronic BP well controlled Continue losartan 25 mg daily cmp  

## 2019-10-27 NOTE — Assessment & Plan Note (Signed)
H/o cancer Following with urology

## 2019-10-27 NOTE — Assessment & Plan Note (Signed)
Chronic Murmur on exam is mild and stable Following with cardio

## 2019-10-27 NOTE — Assessment & Plan Note (Signed)
Lab Results  Component Value Date   HGBA1C 7.0 (H) 04/23/2019   Chronic Goal a1c < 7 % Check a1c Continue metformin, jardiance at current doses Low sugar / carb diet Stressed regular exercise

## 2019-10-28 ENCOUNTER — Other Ambulatory Visit: Payer: Self-pay | Admitting: Internal Medicine

## 2019-10-28 LAB — CBC WITH DIFFERENTIAL/PLATELET
Absolute Monocytes: 694 cells/uL (ref 200–950)
Basophils Absolute: 99 cells/uL (ref 0–200)
Basophils Relative: 1.6 %
Eosinophils Absolute: 198 cells/uL (ref 15–500)
Eosinophils Relative: 3.2 %
HCT: 48.5 % (ref 38.5–50.0)
Hemoglobin: 16.2 g/dL (ref 13.2–17.1)
Lymphs Abs: 1345 cells/uL (ref 850–3900)
MCH: 31.8 pg (ref 27.0–33.0)
MCHC: 33.4 g/dL (ref 32.0–36.0)
MCV: 95.1 fL (ref 80.0–100.0)
MPV: 9.7 fL (ref 7.5–12.5)
Monocytes Relative: 11.2 %
Neutro Abs: 3863 cells/uL (ref 1500–7800)
Neutrophils Relative %: 62.3 %
Platelets: 275 10*3/uL (ref 140–400)
RBC: 5.1 10*6/uL (ref 4.20–5.80)
RDW: 12.4 % (ref 11.0–15.0)
Total Lymphocyte: 21.7 %
WBC: 6.2 10*3/uL (ref 3.8–10.8)

## 2019-10-28 LAB — LIPID PANEL
Cholesterol: 133 mg/dL (ref <200)
HDL: 57 mg/dL (ref >=40)
LDL Cholesterol (Calc): 55 mg/dL (calc)
Non-HDL Cholesterol (Calc): 76 mg/dL (calc) (ref <130)
Total CHOL/HDL Ratio: 2.3 (calc) (ref <5.0)
Triglycerides: 128 mg/dL (ref <150)

## 2019-10-28 LAB — COMPREHENSIVE METABOLIC PANEL
AG Ratio: 1.9 (calc) (ref 1.0–2.5)
ALT: 15 U/L (ref 9–46)
AST: 13 U/L (ref 10–35)
Albumin: 5 g/dL (ref 3.6–5.1)
Alkaline phosphatase (APISO): 50 U/L (ref 35–144)
BUN: 16 mg/dL (ref 7–25)
CO2: 28 mmol/L (ref 20–32)
Calcium: 9.9 mg/dL (ref 8.6–10.3)
Chloride: 100 mmol/L (ref 98–110)
Creat: 0.8 mg/dL (ref 0.70–1.18)
Globulin: 2.6 g/dL (calc) (ref 1.9–3.7)
Glucose, Bld: 154 mg/dL — ABNORMAL HIGH (ref 65–99)
Potassium: 4.1 mmol/L (ref 3.5–5.3)
Sodium: 139 mmol/L (ref 135–146)
Total Bilirubin: 0.7 mg/dL (ref 0.2–1.2)
Total Protein: 7.6 g/dL (ref 6.1–8.1)

## 2019-10-28 LAB — HEMOGLOBIN A1C
Hgb A1c MFr Bld: 7.3 % of total Hgb — ABNORMAL HIGH (ref <5.7)
Mean Plasma Glucose: 163 (calc)
eAG (mmol/L): 9 (calc)

## 2019-10-28 LAB — TSH: TSH: 1.47 mIU/L (ref 0.40–4.50)

## 2019-10-28 LAB — URIC ACID: Uric Acid, Serum: 3.1 mg/dL — ABNORMAL LOW (ref 4.0–8.0)

## 2019-10-28 MED ORDER — ALLOPURINOL 300 MG PO TABS
150.0000 mg | ORAL_TABLET | Freq: Every day | ORAL | 1 refills | Status: DC
Start: 2019-10-28 — End: 2020-06-07

## 2019-11-06 ENCOUNTER — Ambulatory Visit: Payer: Medicare PPO

## 2019-12-01 ENCOUNTER — Other Ambulatory Visit: Payer: Self-pay | Admitting: Cardiology

## 2019-12-01 ENCOUNTER — Telehealth: Payer: Self-pay | Admitting: Cardiology

## 2019-12-01 NOTE — Telephone Encounter (Signed)
Pt called and asked for a refill of eliquis. Informed pt that a new rx was sent today at 3:45 pm. Pt will confirm with pharmacy

## 2019-12-05 DIAGNOSIS — S62617A Displaced fracture of proximal phalanx of left little finger, initial encounter for closed fracture: Secondary | ICD-10-CM | POA: Diagnosis not present

## 2019-12-05 DIAGNOSIS — E119 Type 2 diabetes mellitus without complications: Secondary | ICD-10-CM | POA: Diagnosis not present

## 2019-12-05 DIAGNOSIS — W010XXA Fall on same level from slipping, tripping and stumbling without subsequent striking against object, initial encounter: Secondary | ICD-10-CM | POA: Diagnosis not present

## 2019-12-05 DIAGNOSIS — S0003XA Contusion of scalp, initial encounter: Secondary | ICD-10-CM | POA: Diagnosis not present

## 2019-12-05 DIAGNOSIS — S0990XA Unspecified injury of head, initial encounter: Secondary | ICD-10-CM | POA: Diagnosis not present

## 2019-12-08 ENCOUNTER — Other Ambulatory Visit: Payer: Self-pay | Admitting: Cardiology

## 2019-12-08 ENCOUNTER — Other Ambulatory Visit: Payer: Self-pay | Admitting: Internal Medicine

## 2020-02-13 ENCOUNTER — Ambulatory Visit: Payer: Medicare PPO | Admitting: Cardiology

## 2020-02-23 NOTE — Progress Notes (Signed)
[facility]. [address] [place], [place]  [address] Phone: [phone] Fax:  [phone]  Date:  02/26/2020   ID:  Bryce Maldonado, DOB February 01, 1940, MRN [id number]  PCP:  Binnie Rail, MD  Cardiologist:  Dr. Ember Henrikson Maldonado     History of Present Illness: Bryce Maldonado is a 80 y.o. male is seen for follow up CAD and atrial flutter.   He has a hx of CAD, s/p CABG in 1995, HL, prostate CA, DJD, spinal stenosis. Prior to left total hip replacement in 03/2010 he underwent a Lexiscan Myoview. This demonstrated an EF of 66% and inferior ischemia. LHC 01/2010: Proximal LAD 90%, proximal OM totally occluded, RCA occluded, SVG-PDA patent, SVG-diagonal patent, SVG-OM patent, LIMA-LAD patent. There were left to right collaterals. EF 60-65%. He was treated medically.   In 2016 he had new onset atrial flutter. He was anticoagulated and underwent Ablation by Dr. Rayann Heman on October 01, 2014. He had repeat ablation on Nov. 4 2016.  On his visit last year he was noted to be in atrial flutter with controlled rate. He was asymptomatic. Started on Eliquis.   On follow up today he is doing well. No chest pain, palpitations, dyspnea, dizziness. No bleeding. Stays active.     Wt Readings from Last 3 Encounters:  02/26/20 197 lb 12.8 oz (89.7 kg)  10/27/19 197 lb (89.4 kg)  06/02/19 197 lb 9.6 oz (89.6 kg)     Past Medical History:  Diagnosis Date  . Anemia   . BPH (benign prostatic hyperplasia)    Elevated PSA  . Childhood asthma   . Coronary artery disease    post CABG x4 -- in 1995 -- due to three-vessel coronary artery disease  . Diverticular disease   . Dyslipidemia   . First degree AV block   . Gout   . Osteoarthritis    "everywhere" (12/04/2014)  . Prostate cancer [facility]) 2012   Dr Karsten Ro  . Spinal stenosis 2004   L4 radiculopathy, herniated nucleus pulposus L4-5  . Tinnitus   . Type II diabetes mellitus ([place])   . Typical atrial flutter [facility])    s/p ablation    Current  Outpatient Medications  Medication Sig Dispense Refill  . allopurinol (ZYLOPRIM) 300 MG tablet Take 0.5 tablets (150 mg total) by mouth daily. 45 tablet 1  . ELIQUIS 5 MG TABS tablet TAKE 1 TABLET(5 MG) BY MOUTH TWICE DAILY 60 tablet 11  . empagliflozin (JARDIANCE) 25 MG TABS tablet Take 1 tablet (25 mg total) by mouth daily. 90 tablet 1  . ezetimibe (ZETIA) 10 MG tablet TAKE 1 TABLET(10 MG) BY MOUTH AT BEDTIME 90 tablet 0  . glucose blood (ONETOUCH VERIO) test strip 1 each by Other route 2 (two) times daily. Use to check  Blood sugars twice a day Dx E11.9 100 each 3  . losartan (COZAAR) 25 MG tablet TAKE 1 TABLET(25 MG) BY MOUTH DAILY 90 tablet 1  . Melatonin 5 MG TABS Take 5 mg by mouth at bedtime.    . metFORMIN (GLUCOPHAGE) 500 MG tablet TAKE 2 TABLETS(1000 MG) BY MOUTH TWICE DAILY WITH A MEAL 360 tablet 1  . Multiple Vitamins-Minerals (CENTRUM PO) Take 1 tablet by mouth daily.    Glory Rosebush DELICA LANCETS 99991111 MISC Use to help check blood sugars twice a day Dx E11.9 100 each 3  . rosuvastatin (CRESTOR) 10 MG tablet TAKE 1 TABLET BY MOUTH AT BEDTIME TIME. 90 tablet 0  . venlafaxine XR (  EFFEXOR XR) 37.5 MG 24 hr capsule Take 1 capsule (37.5 mg total) by mouth daily with breakfast. 30 capsule 5   No current facility-administered medications for this visit.    Allergies:    Allergies  Allergen Reactions  . Naproxen Sodium     rash    Social History:  The patient  reports that he quit smoking about 50 years ago. His smoking use included cigarettes. He has a 37.50 pack-year smoking history. He has never used smokeless tobacco. He reports current alcohol use of about 4.0 standard drinks of alcohol per week. He reports that he does not use drugs.   ROS:  Please see the history of present illness.      All other systems reviewed and negative.   PHYSICAL EXAM: VS:  BP (!) 144/70 (BP Location: Right Arm, Patient Position: Sitting)   Pulse 73   Ht 6' (1.829 m)   Wt 197 lb 12.8 oz (89.7 kg)    SpO2 94%   BMI 26.83 kg/m  GENERAL:  Well appearing WM in NAD HEENT:  PERRL, EOMI, sclera are clear. Oropharynx is clear. NECK:  No jugular venous distention, carotid upstroke brisk and symmetric, no bruits, no thyromegaly or adenopathy LUNGS:  Clear to auscultation bilaterally CHEST:  Unremarkable HEART:  IRRR,  PMI not displaced or sustained,S1 and S2 within normal limits, no S3, no S4: no clicks, no rubs, gr 2/6 systolic murmur RUSB to apex. ABD:  Soft, nontender. BS +, no masses or bruits. No hepatomegaly, no splenomegaly EXT:  2 + pulses throughout, no edema, no cyanosis no clubbing SKIN:  Warm and dry.  No rashes NEURO:  Alert and oriented x 3. Cranial nerves II through XII intact. PSYCH:  Cognitively intact    Laboratory data:  Lab Results  Component Value Date   WBC 6.2 10/27/2019   HGB 16.2 10/27/2019   HCT 48.5 10/27/2019   PLT 275 10/27/2019   GLUCOSE 154 (H) 10/27/2019   CHOL 133 10/27/2019   TRIG 128 10/27/2019   HDL 57 10/27/2019   LDLDIRECT 86.2 06/24/2010   LDLCALC 55 10/27/2019   ALT 15 10/27/2019   AST 13 10/27/2019   NA 139 10/27/2019   K 4.1 10/27/2019   CL 100 10/27/2019   CREATININE 0.80 10/27/2019   BUN 16 10/27/2019   CO2 28 10/27/2019   TSH 1.47 10/27/2019   PSA 6.12 (H) 08/06/2006   INR 0.97 03/04/2010   HGBA1C 7.3 (H) 10/27/2019   MICROALBUR 1.1 09/27/2016    Ecg today shows Afib with rate 73 bpm. Nonspecific ST T abnormality. I have personally reviewed and interpreted this study.  Echo 12/31/18:IMPRESSIONS    1. Left ventricular ejection fraction, by visual estimation, is 70 to  75%. The left ventricle has hyperdynamic function. There is no left  ventricular hypertrophy.  2. Left ventricular diastolic function could not be evaluated.  3. Global right ventricle has normal systolic function.The right  ventricular size is normal. No increase in right ventricular wall  thickness.  4. Left atrial size was mildly dilated.  5.  Right atrial size was normal.  6. Moderate to severe aortic valve annular calcification.  7. Moderate to severe mitral annular calcification.  8. The mitral valve is grossly normal. Mild mitral valve regurgitation.  9. The tricuspid valve is normal in structure. Tricuspid valve  regurgitation is mild.  10. The aortic valve has an indeterminant number of cusps. Aortic valve  regurgitation is mild.  11. The pulmonic valve was  normal in structure. Pulmonic valve  regurgitation is trivial.  12. Aortic dilatation noted.  13. There is mild dilatation of the ascending aorta.  14. Normal pulmonary artery systolic pressure.  15. The atrial septum is grossly normal.    ASSESSMENT AND PLAN:  1. CAD: s/p CABG 1995.  He is active and asymptomatic.  He had a LHC in 2012 that demonstrated patent bypass grafts.  Continue medical Rx.  2. Atrial flutter- s/p  Ablation x 2 in 2016. Now with recurrent Afib. Rate is controlled and he is completely asymptomatic. [place] vasc score of 4. Recommend long term anticoagulation. Will continue Eliquis 5 mg bid.   3.  Hyperlipidemia:  At goal on statin.  4.   DM per primary care. A1c 7.3%  5.   HTN. Well controlled  6.   AV sclerosis  I will follow up in one year.  Signed, Bryce Calbert [place] MD, [facility]

## 2020-02-26 ENCOUNTER — Encounter: Payer: Self-pay | Admitting: Cardiology

## 2020-02-26 ENCOUNTER — Other Ambulatory Visit: Payer: Self-pay

## 2020-02-26 ENCOUNTER — Ambulatory Visit: Payer: Medicare PPO | Admitting: Cardiology

## 2020-02-26 VITALS — BP 144/70 | HR 73 | Ht 72.0 in | Wt 197.8 lb

## 2020-02-26 DIAGNOSIS — E1351 Other specified diabetes mellitus with diabetic peripheral angiopathy without gangrene: Secondary | ICD-10-CM

## 2020-02-26 DIAGNOSIS — I4819 Other persistent atrial fibrillation: Secondary | ICD-10-CM

## 2020-02-26 DIAGNOSIS — E782 Mixed hyperlipidemia: Secondary | ICD-10-CM | POA: Diagnosis not present

## 2020-02-26 DIAGNOSIS — I25708 Atherosclerosis of coronary artery bypass graft(s), unspecified, with other forms of angina pectoris: Secondary | ICD-10-CM

## 2020-02-26 DIAGNOSIS — E1365 Other specified diabetes mellitus with hyperglycemia: Secondary | ICD-10-CM

## 2020-02-26 DIAGNOSIS — I10 Essential (primary) hypertension: Secondary | ICD-10-CM | POA: Diagnosis not present

## 2020-02-26 DIAGNOSIS — IMO0002 Reserved for concepts with insufficient information to code with codable children: Secondary | ICD-10-CM

## 2020-03-19 ENCOUNTER — Other Ambulatory Visit: Payer: Self-pay

## 2020-03-19 MED ORDER — EZETIMIBE 10 MG PO TABS: ORAL_TABLET | ORAL | 3 refills | Status: DC

## 2020-03-19 MED ORDER — ROSUVASTATIN CALCIUM 10 MG PO TABS: ORAL_TABLET | ORAL | 0 refills | Status: DC

## 2020-03-24 DIAGNOSIS — H0102B Squamous blepharitis left eye, upper and lower eyelids: Secondary | ICD-10-CM | POA: Diagnosis not present

## 2020-03-24 DIAGNOSIS — H0102A Squamous blepharitis right eye, upper and lower eyelids: Secondary | ICD-10-CM | POA: Diagnosis not present

## 2020-03-24 DIAGNOSIS — H40013 Open angle with borderline findings, low risk, bilateral: Secondary | ICD-10-CM | POA: Diagnosis not present

## 2020-04-25 NOTE — Patient Instructions (Addendum)
    Blood work was ordered.      Medications changes include :   none     Please followup in 6 months  

## 2020-04-25 NOTE — Progress Notes (Signed)
Subjective:    Patient ID: Bryce Maldonado, male    DOB: 1940/04/19, 80 y.o.   MRN: [id number]  HPI The patient is here for follow up of their chronic medical problems, including htn, DM, hyperlipidemia, h/o gout, anxiety  He walks regularly.  He is eating well.    He has no concerns.   Medications and allergies reviewed with patient and updated if appropriate.  Patient Active Problem List   Diagnosis Date Noted  . Tinea cruris 06/02/2019  . Toe pain 04/03/2018  . Mild aortic stenosis 10/02/2017  . Hypertension 09/27/2016  . Anxiety 03/27/2016  . SVT (supraventricular tachycardia) ([place]) 12/04/2014  . Typical atrial flutter ([place]) 11/03/2014  . PAF (paroxysmal atrial fibrillation) ([place]) 08/13/2014  . DM (diabetes mellitus), secondary, uncontrolled, with peripheral vascular complications ([place]) 56/21/3086  . History of gout 03/04/2009  . ATOPIC RHINITIS 03/04/2009  . PROSTATE CANCER, HX OF 03/04/2009  . Mixed hyperlipidemia 08/06/2006  . Coronary atherosclerosis 08/06/2006    Current Outpatient Medications on File Prior to Visit  Medication Sig Dispense Refill  . allopurinol (ZYLOPRIM) 300 MG tablet Take 0.5 tablets (150 mg total) by mouth daily. 45 tablet 1  . ELIQUIS 5 MG TABS tablet TAKE 1 TABLET(5 MG) BY MOUTH TWICE DAILY 60 tablet 11  . empagliflozin (JARDIANCE) 25 MG TABS tablet Take 1 tablet (25 mg total) by mouth daily. 90 tablet 1  . ezetimibe (ZETIA) 10 MG tablet TAKE 1 TABLET(10 MG) BY MOUTH AT BEDTIME 90 tablet 3  . glucose blood (ONETOUCH VERIO) test strip 1 each by Other route 2 (two) times daily. Use to check  Blood sugars twice a day Dx E11.9 100 each 3  . losartan (COZAAR) 25 MG tablet TAKE 1 TABLET(25 MG) BY MOUTH DAILY 90 tablet 1  . Melatonin 5 MG TABS Take 5 mg by mouth at bedtime.    . metFORMIN (GLUCOPHAGE) 500 MG tablet TAKE 2 TABLETS(1000 MG) BY MOUTH TWICE DAILY WITH A MEAL 360 tablet 1  . Multiple Vitamins-Minerals (CENTRUM PO) Take 1 tablet by  mouth daily.    Glory Rosebush DELICA LANCETS 57Q MISC Use to help check blood sugars twice a day Dx E11.9 100 each 3  . rosuvastatin (CRESTOR) 10 MG tablet TAKE 1 TABLET BY MOUTH AT BEDTIME TIME. 90 tablet 0   No current facility-administered medications on file prior to visit.    Past Medical History:  Diagnosis Date  . Anemia   . BPH (benign prostatic hyperplasia)    Elevated PSA  . Childhood asthma   . Coronary artery disease    post CABG x4 -- in 1995 -- due to three-vessel coronary artery disease  . Diverticular disease   . Dyslipidemia   . First degree AV block   . Gout   . Osteoarthritis    "everywhere" (12/04/2014)  . Prostate cancer [facility]) 2012   Dr Karsten Ro  . Spinal stenosis 2004   L4 radiculopathy, herniated nucleus pulposus L4-5  . Tinnitus   . Type II diabetes mellitus ([place])   . Typical atrial flutter [facility])    s/p ablation    Past Surgical History:  Procedure Laterality Date  . APPENDECTOMY  1943  . ATRIAL FLUTTER ABLATION  12/04/2014  . BACK SURGERY    . CARDIAC CATHETERIZATION  02/10/2010   Est. EF of 60-65% -- Severe three-vessel obstructive atherosclerotic coronary artery disease -- All grafts were patent including left internal mammary artery graft to left anterior descending coronary artery, saphenous vein  graft to the diagonal, saphenous vein graft to obtuse marginal vessel, and saphenous vein graft to the posterior descending coronary artery -- Normal left ventricular function   . COLONOSCOPY     negative X3; Dr Carlean Purl  . CORONARY ARTERY BYPASS GRAFT  10/21/1993   x4 -- using the LIM artery graft to the LAD artery, with saphenous vein grafts to the diagonal branch of the LAD, OM branch the left circumflex coronary artery, and the posterior descending branch of the RCA -- Est. EF of 65%-- Surgeon: Gaye Pollack, M.D.              . ELECTROPHYSIOLOGIC STUDY N/A 10/01/2014   CTI ablation by Dr Curt Bears  . ELECTROPHYSIOLOGIC STUDY N/A 12/04/2014   repeat CTI  ablation by Dr Rayann Heman  . FLEXIBLE SIGMOIDOSCOPY  2005    external hemorrhoids  . JOINT REPLACEMENT    . MICRODISCECTOMY LUMBAR  2004   L4-5, Dr. Arnoldo Morale  . PROSTATE BIOPSY  10/2008   "had radiation tx for prostate cancer"  . SALIVARY STONE REMOVAL  2001  . TOTAL HIP ARTHROPLASTY Right November 2010  . TOTAL HIP ARTHROPLASTY Left 03/2010   Dr Alvan Dame    Social History   Socioeconomic History  . Marital status: Married    Spouse name: Not on file  . Number of children: 2  . Years of education: 70  . Highest education level: Not on file  Occupational History  . Occupation: retired  Tobacco Use  . Smoking status: Former Smoker    Packs/day: 2.50    Years: 15.00    Pack years: 37.50    Types: Cigarettes    Quit date: 01/30/1970    Years since quitting: 50.2  . Smokeless tobacco: Never Used  . Tobacco comment: smoked 1959-1972  Vaping Use  . Vaping Use: Never used  Substance and Sexual Activity  . Alcohol use: Yes    Alcohol/week: 4.0 standard drinks    Types: 4 Standard drinks or equivalent per week    Comment: 11/42016 "nothing to drink since 07/25/2014"  . Drug use: No  . Sexual activity: Not Currently  Other Topics Concern  . Not on file  Social History Narrative   Pt lives in [place] with spouse. 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  Enjoys reading and walking.   Left handed   [place]   Social Determinants of Health   Financial Resource Strain: Not on file  Food Insecurity: Not on file  Transportation Needs: Not on file  Physical Activity: Not on file  Stress: Not on file  Social Connections: Not on file    Family History  Problem Relation Age of Onset  . Diabetes Father   . Hypertension Father   . Lung cancer Father        smoker  . Heart failure Mother 63  . Hyperlipidemia Sister   . Coronary artery disease Sister   . Heart attack Paternal Aunt        X 2; both > 65  . Colon cancer Neg Hx   . Stroke Neg Hx     Review of Systems   Constitutional: Negative for chills and fever.  Respiratory: Negative for cough, shortness of breath and wheezing.   Cardiovascular: Negative for chest pain, palpitations and leg swelling.  Neurological: Negative for dizziness, light-headedness, numbness and headaches.       Objective:   Vitals:   04/26/20 0848  BP: 130/82  Pulse: 92  Temp: 98.1 F (36.7 C)  SpO2: 96%   BP Readings from Last 3 Encounters:  04/26/20 130/82  02/26/20 (!) 144/70  10/27/19 128/82   Wt Readings from Last 3 Encounters:  04/26/20 191 lb (86.6 kg)  02/26/20 197 lb 12.8 oz (89.7 kg)  10/27/19 197 lb (89.4 kg)   Body mass index is 25.9 kg/m.   Physical Exam    Constitutional: Appears well-developed and well-nourished. No distress.  HENT:  Head: Normocephalic and atraumatic.  Neck: Neck supple. No tracheal deviation present. No thyromegaly present.  No cervical lymphadenopathy Cardiovascular: Normal rate, regular rhythm and normal heart sounds.   2/6 sys murmur heard. No carotid bruit .  No edema Pulmonary/Chest: Effort normal and breath sounds normal. No respiratory distress. No has no wheezes. No rales.  Skin: Skin is warm and dry. Not diaphoretic.  Psychiatric: Normal mood and affect. Behavior is normal.      Assessment & Plan:    See Problem List for Assessment and Plan of chronic medical problems.    This visit occurred during the SARS-CoV-2 public health emergency.  Safety protocols were in place, including screening questions prior to the visit, additional usage of staff PPE, and extensive cleaning of exam room while observing appropriate contact time as indicated for disinfecting solutions.

## 2020-04-26 ENCOUNTER — Other Ambulatory Visit: Payer: Self-pay | Admitting: Internal Medicine

## 2020-04-26 ENCOUNTER — Other Ambulatory Visit: Payer: Self-pay

## 2020-04-26 ENCOUNTER — Ambulatory Visit: Payer: Medicare PPO | Admitting: Internal Medicine

## 2020-04-26 ENCOUNTER — Encounter: Payer: Self-pay | Admitting: Internal Medicine

## 2020-04-26 VITALS — BP 130/82 | HR 92 | Temp 98.1°F | Ht 72.0 in | Wt 191.0 lb

## 2020-04-26 DIAGNOSIS — E782 Mixed hyperlipidemia: Secondary | ICD-10-CM | POA: Diagnosis not present

## 2020-04-26 DIAGNOSIS — E1365 Other specified diabetes mellitus with hyperglycemia: Secondary | ICD-10-CM

## 2020-04-26 DIAGNOSIS — I480 Paroxysmal atrial fibrillation: Secondary | ICD-10-CM

## 2020-04-26 DIAGNOSIS — I10 Essential (primary) hypertension: Secondary | ICD-10-CM | POA: Diagnosis not present

## 2020-04-26 DIAGNOSIS — Z8739 Personal history of other diseases of the musculoskeletal system and connective tissue: Secondary | ICD-10-CM

## 2020-04-26 DIAGNOSIS — E1351 Other specified diabetes mellitus with diabetic peripheral angiopathy without gangrene: Secondary | ICD-10-CM

## 2020-04-26 DIAGNOSIS — F419 Anxiety disorder, unspecified: Secondary | ICD-10-CM | POA: Diagnosis not present

## 2020-04-26 DIAGNOSIS — IMO0002 Reserved for concepts with insufficient information to code with codable children: Secondary | ICD-10-CM

## 2020-04-26 LAB — CBC WITH DIFFERENTIAL/PLATELET
Basophils Absolute: 0.1 10*3/uL (ref 0.0–0.1)
Basophils Relative: 1.5 % (ref 0.0–3.0)
Eosinophils Absolute: 0.2 10*3/uL (ref 0.0–0.7)
Eosinophils Relative: 2.8 % (ref 0.0–5.0)
HCT: 47.4 % (ref 39.0–52.0)
Hemoglobin: 15.8 g/dL (ref 13.0–17.0)
Lymphocytes Relative: 20.8 % (ref 12.0–46.0)
Lymphs Abs: 1.2 10*3/uL (ref 0.7–4.0)
MCHC: 33.4 g/dL (ref 30.0–36.0)
MCV: 93.2 fl (ref 78.0–100.0)
Monocytes Absolute: 0.6 10*3/uL (ref 0.1–1.0)
Monocytes Relative: 10.6 % (ref 3.0–12.0)
Neutro Abs: 3.8 10*3/uL (ref 1.4–7.7)
Neutrophils Relative %: 64.3 % (ref 43.0–77.0)
Platelets: 307 10*3/uL (ref 150.0–400.0)
RBC: 5.09 Mil/uL (ref 4.22–5.81)
RDW: 13.9 % (ref 11.5–15.5)
WBC: 5.8 10*3/uL (ref 4.0–10.5)

## 2020-04-26 LAB — LIPID PANEL
Cholesterol: 134 mg/dL (ref 0–200)
HDL: 52.5 mg/dL (ref >39.00)
LDL Cholesterol: 56 mg/dL (ref 0–99)
NonHDL: 81.93
Total CHOL/HDL Ratio: 3
Triglycerides: 129 mg/dL (ref 0.0–149.0)
VLDL: 25.8 mg/dL (ref 0.0–40.0)

## 2020-04-26 LAB — COMPREHENSIVE METABOLIC PANEL
ALT: 16 U/L (ref 0–53)
AST: 14 U/L (ref 0–37)
Albumin: 5 g/dL (ref 3.5–5.2)
Alkaline Phosphatase: 43 U/L (ref 39–117)
BUN: 23 mg/dL (ref 6–23)
CO2: 29 mEq/L (ref 19–32)
Calcium: 10.3 mg/dL (ref 8.4–10.5)
Chloride: 100 mEq/L (ref 96–112)
Creatinine, Ser: 0.8 mg/dL (ref 0.40–1.50)
GFR: 84.74 mL/min (ref >60.00)
Glucose, Bld: 154 mg/dL — ABNORMAL HIGH (ref 70–99)
Potassium: 4.2 mEq/L (ref 3.5–5.1)
Sodium: 137 mEq/L (ref 135–145)
Total Bilirubin: 0.8 mg/dL (ref 0.2–1.2)
Total Protein: 7.7 g/dL (ref 6.0–8.3)

## 2020-04-26 LAB — HEMOGLOBIN A1C: Hgb A1c MFr Bld: 6.8 % — ABNORMAL HIGH (ref 4.6–6.5)

## 2020-04-26 NOTE — Assessment & Plan Note (Signed)
Chronic BP well controlled Continue losartan 25 mg daily cmp  

## 2020-04-26 NOTE — Assessment & Plan Note (Signed)
Chronic Check a1c Continue regular exercise, diabetic diet continue jardiance 25 mg daily, metformin 1000 mg BID

## 2020-04-26 NOTE — Assessment & Plan Note (Signed)
Chronic Continue allopurinol 150 mg daily No gout symptoms in decades

## 2020-04-26 NOTE — Assessment & Plan Note (Signed)
Chronic Check lipid panel  Continue zetia 10 mg daily, crestor 10 mg daily Regular exercise and healthy diet encouraged

## 2020-04-26 NOTE — Assessment & Plan Note (Signed)
Chronic Following with cardiology On eliquis - no abn bruising or bleeding.

## 2020-04-26 NOTE — Assessment & Plan Note (Signed)
Chronic Controlled, stable Continue effexor 37.5 mg

## 2020-04-26 NOTE — Addendum Note (Signed)
Addended by: Jacobo Forest on: 04/26/2020 09:24 AM   Modules accepted: Orders

## 2020-06-05 ENCOUNTER — Other Ambulatory Visit: Payer: Self-pay | Admitting: Internal Medicine

## 2020-06-05 ENCOUNTER — Other Ambulatory Visit: Payer: Self-pay | Admitting: Cardiology

## 2020-06-07 ENCOUNTER — Other Ambulatory Visit: Payer: Self-pay

## 2020-06-17 ENCOUNTER — Other Ambulatory Visit: Payer: Self-pay | Admitting: Internal Medicine

## 2020-06-29 ENCOUNTER — Other Ambulatory Visit: Payer: Self-pay | Admitting: Internal Medicine

## 2020-08-18 ENCOUNTER — Telehealth: Payer: Self-pay | Admitting: Internal Medicine

## 2020-08-18 NOTE — Telephone Encounter (Signed)
LVM for pt to rtn my call to schedule AWV with [facility]. Please schedule AWV if pt calls the office  

## 2020-08-24 DIAGNOSIS — M5442 Lumbago with sciatica, left side: Secondary | ICD-10-CM | POA: Diagnosis not present

## 2020-08-31 DIAGNOSIS — M545 Low back pain, unspecified: Secondary | ICD-10-CM | POA: Diagnosis not present

## 2020-08-31 DIAGNOSIS — M2569 Stiffness of other specified joint, not elsewhere classified: Secondary | ICD-10-CM | POA: Diagnosis not present

## 2020-08-31 DIAGNOSIS — M6281 Muscle weakness (generalized): Secondary | ICD-10-CM | POA: Diagnosis not present

## 2020-08-31 DIAGNOSIS — M79605 Pain in left leg: Secondary | ICD-10-CM | POA: Diagnosis not present

## 2020-09-06 ENCOUNTER — Other Ambulatory Visit: Payer: Self-pay

## 2020-09-06 MED ORDER — ROSUVASTATIN CALCIUM 10 MG PO TABS: ORAL_TABLET | ORAL | 0 refills | Status: DC

## 2020-09-07 DIAGNOSIS — M545 Low back pain, unspecified: Secondary | ICD-10-CM | POA: Diagnosis not present

## 2020-09-07 DIAGNOSIS — M2569 Stiffness of other specified joint, not elsewhere classified: Secondary | ICD-10-CM | POA: Diagnosis not present

## 2020-09-07 DIAGNOSIS — M79605 Pain in left leg: Secondary | ICD-10-CM | POA: Diagnosis not present

## 2020-09-07 DIAGNOSIS — M6281 Muscle weakness (generalized): Secondary | ICD-10-CM | POA: Diagnosis not present

## 2020-09-09 DIAGNOSIS — M545 Low back pain, unspecified: Secondary | ICD-10-CM | POA: Diagnosis not present

## 2020-09-09 DIAGNOSIS — M79605 Pain in left leg: Secondary | ICD-10-CM | POA: Diagnosis not present

## 2020-09-09 DIAGNOSIS — M6281 Muscle weakness (generalized): Secondary | ICD-10-CM | POA: Diagnosis not present

## 2020-09-09 DIAGNOSIS — M2569 Stiffness of other specified joint, not elsewhere classified: Secondary | ICD-10-CM | POA: Diagnosis not present

## 2020-09-14 DIAGNOSIS — M6281 Muscle weakness (generalized): Secondary | ICD-10-CM | POA: Diagnosis not present

## 2020-09-14 DIAGNOSIS — M2569 Stiffness of other specified joint, not elsewhere classified: Secondary | ICD-10-CM | POA: Diagnosis not present

## 2020-09-14 DIAGNOSIS — M545 Low back pain, unspecified: Secondary | ICD-10-CM | POA: Diagnosis not present

## 2020-09-14 DIAGNOSIS — M79605 Pain in left leg: Secondary | ICD-10-CM | POA: Diagnosis not present

## 2020-09-15 DIAGNOSIS — M545 Low back pain, unspecified: Secondary | ICD-10-CM | POA: Diagnosis not present

## 2020-09-15 DIAGNOSIS — M79605 Pain in left leg: Secondary | ICD-10-CM | POA: Diagnosis not present

## 2020-09-15 DIAGNOSIS — M6281 Muscle weakness (generalized): Secondary | ICD-10-CM | POA: Diagnosis not present

## 2020-09-15 DIAGNOSIS — M2569 Stiffness of other specified joint, not elsewhere classified: Secondary | ICD-10-CM | POA: Diagnosis not present

## 2020-09-20 DIAGNOSIS — M6281 Muscle weakness (generalized): Secondary | ICD-10-CM | POA: Diagnosis not present

## 2020-09-20 DIAGNOSIS — M79605 Pain in left leg: Secondary | ICD-10-CM | POA: Diagnosis not present

## 2020-09-20 DIAGNOSIS — M545 Low back pain, unspecified: Secondary | ICD-10-CM | POA: Diagnosis not present

## 2020-09-20 DIAGNOSIS — M2569 Stiffness of other specified joint, not elsewhere classified: Secondary | ICD-10-CM | POA: Diagnosis not present

## 2020-09-22 DIAGNOSIS — M6281 Muscle weakness (generalized): Secondary | ICD-10-CM | POA: Diagnosis not present

## 2020-09-22 DIAGNOSIS — M545 Low back pain, unspecified: Secondary | ICD-10-CM | POA: Diagnosis not present

## 2020-09-22 DIAGNOSIS — M79605 Pain in left leg: Secondary | ICD-10-CM | POA: Diagnosis not present

## 2020-09-22 DIAGNOSIS — M2569 Stiffness of other specified joint, not elsewhere classified: Secondary | ICD-10-CM | POA: Diagnosis not present

## 2020-09-24 DIAGNOSIS — M6281 Muscle weakness (generalized): Secondary | ICD-10-CM | POA: Diagnosis not present

## 2020-09-24 DIAGNOSIS — M545 Low back pain, unspecified: Secondary | ICD-10-CM | POA: Diagnosis not present

## 2020-09-24 DIAGNOSIS — M79605 Pain in left leg: Secondary | ICD-10-CM | POA: Diagnosis not present

## 2020-09-24 DIAGNOSIS — M2569 Stiffness of other specified joint, not elsewhere classified: Secondary | ICD-10-CM | POA: Diagnosis not present

## 2020-09-28 DIAGNOSIS — M545 Low back pain, unspecified: Secondary | ICD-10-CM | POA: Diagnosis not present

## 2020-09-28 DIAGNOSIS — M6281 Muscle weakness (generalized): Secondary | ICD-10-CM | POA: Diagnosis not present

## 2020-09-28 DIAGNOSIS — M2569 Stiffness of other specified joint, not elsewhere classified: Secondary | ICD-10-CM | POA: Diagnosis not present

## 2020-09-28 DIAGNOSIS — M79605 Pain in left leg: Secondary | ICD-10-CM | POA: Diagnosis not present

## 2020-09-30 DIAGNOSIS — M545 Low back pain, unspecified: Secondary | ICD-10-CM | POA: Diagnosis not present

## 2020-09-30 DIAGNOSIS — M79605 Pain in left leg: Secondary | ICD-10-CM | POA: Diagnosis not present

## 2020-09-30 DIAGNOSIS — M2569 Stiffness of other specified joint, not elsewhere classified: Secondary | ICD-10-CM | POA: Diagnosis not present

## 2020-09-30 DIAGNOSIS — M6281 Muscle weakness (generalized): Secondary | ICD-10-CM | POA: Diagnosis not present

## 2020-10-05 DIAGNOSIS — M79605 Pain in left leg: Secondary | ICD-10-CM | POA: Diagnosis not present

## 2020-10-05 DIAGNOSIS — M2569 Stiffness of other specified joint, not elsewhere classified: Secondary | ICD-10-CM | POA: Diagnosis not present

## 2020-10-05 DIAGNOSIS — M6281 Muscle weakness (generalized): Secondary | ICD-10-CM | POA: Diagnosis not present

## 2020-10-05 DIAGNOSIS — M545 Low back pain, unspecified: Secondary | ICD-10-CM | POA: Diagnosis not present

## 2020-10-07 DIAGNOSIS — M6281 Muscle weakness (generalized): Secondary | ICD-10-CM | POA: Diagnosis not present

## 2020-10-07 DIAGNOSIS — M79605 Pain in left leg: Secondary | ICD-10-CM | POA: Diagnosis not present

## 2020-10-07 DIAGNOSIS — M545 Low back pain, unspecified: Secondary | ICD-10-CM | POA: Diagnosis not present

## 2020-10-07 DIAGNOSIS — M2569 Stiffness of other specified joint, not elsewhere classified: Secondary | ICD-10-CM | POA: Diagnosis not present

## 2020-10-12 DIAGNOSIS — M79605 Pain in left leg: Secondary | ICD-10-CM | POA: Diagnosis not present

## 2020-10-12 DIAGNOSIS — M6281 Muscle weakness (generalized): Secondary | ICD-10-CM | POA: Diagnosis not present

## 2020-10-12 DIAGNOSIS — M545 Low back pain, unspecified: Secondary | ICD-10-CM | POA: Diagnosis not present

## 2020-10-12 DIAGNOSIS — M2569 Stiffness of other specified joint, not elsewhere classified: Secondary | ICD-10-CM | POA: Diagnosis not present

## 2020-10-14 DIAGNOSIS — M6281 Muscle weakness (generalized): Secondary | ICD-10-CM | POA: Diagnosis not present

## 2020-10-14 DIAGNOSIS — M79605 Pain in left leg: Secondary | ICD-10-CM | POA: Diagnosis not present

## 2020-10-14 DIAGNOSIS — M2569 Stiffness of other specified joint, not elsewhere classified: Secondary | ICD-10-CM | POA: Diagnosis not present

## 2020-10-14 DIAGNOSIS — M545 Low back pain, unspecified: Secondary | ICD-10-CM | POA: Diagnosis not present

## 2020-10-18 DIAGNOSIS — E119 Type 2 diabetes mellitus without complications: Secondary | ICD-10-CM | POA: Diagnosis not present

## 2020-10-18 DIAGNOSIS — H04123 Dry eye syndrome of bilateral lacrimal glands: Secondary | ICD-10-CM | POA: Diagnosis not present

## 2020-10-18 DIAGNOSIS — H25813 Combined forms of age-related cataract, bilateral: Secondary | ICD-10-CM | POA: Diagnosis not present

## 2020-10-18 DIAGNOSIS — H0102B Squamous blepharitis left eye, upper and lower eyelids: Secondary | ICD-10-CM | POA: Diagnosis not present

## 2020-10-18 DIAGNOSIS — H0102A Squamous blepharitis right eye, upper and lower eyelids: Secondary | ICD-10-CM | POA: Diagnosis not present

## 2020-10-18 DIAGNOSIS — H40013 Open angle with borderline findings, low risk, bilateral: Secondary | ICD-10-CM | POA: Diagnosis not present

## 2020-10-18 LAB — HM DIABETES EYE EXAM

## 2020-10-28 NOTE — Patient Instructions (Signed)
Blood work was ordered.     Medications changes include :     Your prescription(s) have been submitted to your pharmacy. Please take as directed and contact our office if you believe you are having problem(s) with the medication(s).   A referral was ordered for        Someone from their office will call you to schedule an appointment.    Please followup in 6 months   Health Maintenance, Male Adopting a healthy lifestyle and getting preventive care are important in promoting health and wellness. Ask your health care provider about: The right schedule for you to have regular tests and exams. Things you can do on your own to prevent diseases and keep yourself healthy. What should I know about diet, weight, and exercise? Eat a healthy diet  Eat a diet that includes plenty of vegetables, fruits, low-fat dairy products, and lean protein. Do not eat a lot of foods that are high in solid fats, added sugars, or sodium. Maintain a healthy weight Body mass index (BMI) is a measurement that can be used to identify possible weight problems. It estimates body fat based on height and weight. Your health care provider can help determine your BMI and help you achieve or maintain a healthy weight. Get regular exercise Get regular exercise. This is one of the most important things you can do for your health. Most adults should: Exercise for at least 150 minutes each week. The exercise should increase your heart rate and make you sweat (moderate-intensity exercise). Do strengthening exercises at least twice a week. This is in addition to the moderate-intensity exercise. Spend less time sitting. Even light physical activity can be beneficial. Watch cholesterol and blood lipids Have your blood tested for lipids and cholesterol at 79 years of age, then have this test every 5 years. You may need to have your cholesterol levels checked more often if: Your lipid or cholesterol levels are high. You are  older than 79 years of age. You are at high risk for heart disease. What should I know about cancer screening? Many types of cancers can be detected early and may often be prevented. Depending on your health history and family history, you may need to have cancer screening at various ages. This may include screening for: Colorectal cancer. Prostate cancer. Skin cancer. Lung cancer. What should I know about heart disease, diabetes, and high blood pressure? Blood pressure and heart disease High blood pressure causes heart disease and increases the risk of stroke. This is more likely to develop in people who have high blood pressure readings, are of African descent, or are overweight. Talk with your health care provider about your target blood pressure readings. Have your blood pressure checked: Every 3-5 years if you are 87-71 years of age. Every year if you are 97 years old or older. If you are between the ages of 67 and 54 and are a current or former smoker, ask your health care provider if you should have a one-time screening for abdominal aortic aneurysm (AAA). Diabetes Have regular diabetes screenings. This checks your fasting blood sugar level. Have the screening done: Once every three years after age 78 if you are at a normal weight and have a low risk for diabetes. More often and at a younger age if you are overweight or have a high risk for diabetes. What should I know about preventing infection? Hepatitis B If you have a higher risk for hepatitis B, you should be screened  for this virus. Talk with your health care provider to find out if you are at risk for hepatitis B infection. Hepatitis C Blood testing is recommended for: Everyone born from [birth year] through 1964. Anyone with known risk factors for hepatitis C. Sexually transmitted infections (STIs) You should be screened each year for STIs, including gonorrhea and chlamydia, if: You are sexually active and are younger than 79  years of age. You are older than 79 years of age and your health care provider tells you that you are at risk for this type of infection. Your sexual activity has changed since you were last screened, and you are at increased risk for chlamydia or gonorrhea. Ask your health care provider if you are at risk. Ask your health care provider about whether you are at high risk for HIV. Your health care provider may recommend a prescription medicine to help prevent HIV infection. If you choose to take medicine to prevent HIV, you should first get tested for HIV. You should then be tested every 3 months for as long as you are taking the medicine. Follow these instructions at home: Lifestyle Do not use any products that contain nicotine or tobacco, such as cigarettes, e-cigarettes, and chewing tobacco. If you need help quitting, ask your health care provider. Do not use street drugs. Do not share needles. Ask your health care provider for help if you need support or information about quitting drugs. Alcohol use Do not drink alcohol if your health care provider tells you not to drink. If you drink alcohol: Limit how much you have to 0-2 drinks a day. Be aware of how much alcohol is in your drink. In the [place] one drink equals one 12 oz bottle of beer (355 mL), one 5 oz glass of wine (148 mL), or one 1 oz glass of hard liquor (44 mL). General instructions Schedule regular health, dental, and eye exams. Stay current with your vaccines. Tell your health care provider if: You often feel depressed. You have ever been abused or do not feel safe at home. Summary Adopting a healthy lifestyle and getting preventive care are important in promoting health and wellness. Follow your health care provider's instructions about healthy diet, exercising, and getting tested or screened for diseases. Follow your health care provider's instructions on monitoring your cholesterol and blood pressure. This information is  not intended to replace advice given to you by your health care provider. Make sure you discuss any questions you have with your health care provider. Document Revised: 03/26/2020 Document Reviewed: 01/09/2018 Elsevier Patient Education  2022 [employer].

## 2020-10-28 NOTE — Progress Notes (Signed)
Subjective:    Patient ID: Bryce Maldonado, male    DOB: 04/24/1940, 80 y.o.   MRN: [id number]   This visit occurred during the SARS-CoV-2 public health emergency.  Safety protocols were in place, including screening questions prior to the visit, additional usage of staff PPE, and extensive cleaning of exam room while observing appropriate contact time as indicated for disinfecting solutions.   HPI He is here for a physical exam.     Medications and allergies reviewed with patient and updated if appropriate.  Patient Active Problem List   Diagnosis Date Noted  . Scalp pruritus 12/01/2020  . Toe pain 04/03/2018  . Mild aortic stenosis 10/02/2017  . Hypertension 09/27/2016  . Anxiety 03/27/2016  . SVT (supraventricular tachycardia) ([place]) 12/04/2014  . Typical atrial flutter ([place]) 11/03/2014  . PAF (paroxysmal atrial fibrillation) ([place]) 08/13/2014  . Diabetes ([place]) 04/22/2013  . Gout 03/04/2009  . ATOPIC RHINITIS 03/04/2009  . PROSTATE CANCER, HX OF 03/04/2009  . Mixed hyperlipidemia 08/06/2006  . Coronary atherosclerosis 08/06/2006    Current Outpatient Medications on File Prior to Visit  Medication Sig Dispense Refill  . ezetimibe (ZETIA) 10 MG tablet TAKE 1 TABLET(10 MG) BY MOUTH AT BEDTIME 90 tablet 3  . Melatonin 5 MG TABS Take 5 mg by mouth at bedtime.    . Multiple Vitamins-Minerals (CENTRUM PO) Take 1 tablet by mouth daily.     No current facility-administered medications on file prior to visit.    Past Medical History:  Diagnosis Date  . Anemia   . BPH (benign prostatic hyperplasia)    Elevated PSA  . Childhood asthma   . Coronary artery disease    post CABG x4 -- in 1995 -- due to three-vessel coronary artery disease  . Diverticular disease   . Dyslipidemia   . First degree AV block   . Gout   . Osteoarthritis    "everywhere" (12/04/2014)  . Prostate cancer [facility]) 2012   Dr Karsten Ro  . Spinal stenosis 2004   L4 radiculopathy, herniated nucleus  pulposus L4-5  . Tinnitus   . Type II diabetes mellitus ([place])   . Typical atrial flutter [facility])    s/p ablation    Past Surgical History:  Procedure Laterality Date  . APPENDECTOMY  1943  . ATRIAL FLUTTER ABLATION  12/04/2014  . BACK SURGERY    . CARDIAC CATHETERIZATION  02/10/2010   Est. EF of 60-65% -- Severe three-vessel obstructive atherosclerotic coronary artery disease -- All grafts were patent including left internal mammary artery graft to left anterior descending coronary artery, saphenous vein graft to the diagonal, saphenous vein graft to obtuse marginal vessel, and saphenous vein graft to the posterior descending coronary artery -- Normal left ventricular function   . COLONOSCOPY     negative X3; Dr Carlean Purl  . CORONARY ARTERY BYPASS GRAFT  10/21/1993   x4 -- using the LIM artery graft to the LAD artery, with saphenous vein grafts to the diagonal branch of the LAD, OM branch the left circumflex coronary artery, and the posterior descending branch of the RCA -- Est. EF of 65%-- Surgeon: Gaye Pollack, M.D.              . ELECTROPHYSIOLOGIC STUDY N/A 10/01/2014   CTI ablation by Dr Curt Bears  . ELECTROPHYSIOLOGIC STUDY N/A 12/04/2014   repeat CTI ablation by Dr Rayann Heman  . FLEXIBLE SIGMOIDOSCOPY  2005    external hemorrhoids  . JOINT REPLACEMENT    . MICRODISCECTOMY LUMBAR  2004   L4-5, Dr. Arnoldo Morale  . PROSTATE BIOPSY  10/2008   "had radiation tx for prostate cancer"  . SALIVARY STONE REMOVAL  2001  . TOTAL HIP ARTHROPLASTY Right November 2010  . TOTAL HIP ARTHROPLASTY Left 03/2010   Dr Alvan Dame    Social History   Socioeconomic History  . Marital status: Married    Spouse name: Not on file  . Number of children: 2  . Years of education: 51  . Highest education level: Not on file  Occupational History  . Occupation: retired  Tobacco Use  . Smoking status: Former    Packs/day: 2.50    Years: 15.00    Pack years: 37.50    Types: Cigarettes    Quit date: 01/30/1970    Years  since quitting: 51.0  . Smokeless tobacco: Never  . Tobacco comments:    smoked 1959-1972  Vaping Use  . Vaping Use: Never used  Substance and Sexual Activity  . Alcohol use: Yes    Alcohol/week: 4.0 standard drinks    Types: 4 Standard drinks or equivalent per week    Comment: 11/42016 "nothing to drink since 07/25/2014"  . Drug use: No  . Sexual activity: Not Currently  Other Topics Concern  . Not on file  Social History Narrative   Pt lives in [place] with spouse. 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  Enjoys reading and walking.   Left handed   Nectar   Social Determinants of Health   Financial Resource Strain: Low Risk   . Difficulty of Paying Living Expenses: Not hard at all  Food Insecurity: No Food Insecurity  . Worried About Charity fundraiser in the Last Year: Never true  . Ran Out of Food in the Last Year: Never true  Transportation Needs: No Transportation Needs  . Lack of Transportation (Medical): No  . Lack of Transportation (Non-Medical): No  Physical Activity: Sufficiently Active  . Days of Exercise per Week: 7 days  . Minutes of Exercise per Session: 30 min  Stress: No Stress Concern Present  . Feeling of Stress : Not at all  Social Connections: Socially Integrated  . Frequency of Communication with Friends and Family: More than three times a week  . Frequency of Social Gatherings with Friends and Family: More than three times a week  . Attends Religious Services: More than 4 times per year  . Active Member of Clubs or Organizations: Yes  . Attends Archivist Meetings: More than 4 times per year  . Marital Status: Married    Family History  Problem Relation Age of Onset  . Diabetes Father   . Hypertension Father   . Lung cancer Father        smoker  . Heart failure Mother 75  . Hyperlipidemia Sister   . Coronary artery disease Sister   . Heart attack Paternal Aunt        X 2; both > 65  . Colon cancer Neg Hx   . Stroke Neg Hx      Review of Systems     Objective:  There were no vitals filed for this visit. There were no vitals filed for this visit. There is no height or weight on file to calculate BMI.  BP Readings from Last 3 Encounters:  12/01/20 122/70  12/01/20 122/70  04/26/20 130/82    Wt Readings from Last 3 Encounters:  12/01/20 189 lb 3.2 oz (85.8 kg)  12/01/20 189 lb 3.2 oz (  85.8 kg)  04/26/20 191 lb (86.6 kg)     Physical Exam Constitutional: He appears well-developed and well-nourished. No distress.  HENT:  Head: Normocephalic and atraumatic.  Right Ear: External ear normal.  Left Ear: External ear normal.  Mouth/Throat: Oropharynx is clear and moist.  Normal ear canals and TM b/l  Eyes: Conjunctivae and EOM are normal.  Neck: Neck supple. No tracheal deviation present. No thyromegaly present.  No carotid bruit  Cardiovascular: Normal rate, regular rhythm, normal heart sounds and intact distal pulses.   No murmur heard. Pulmonary/Chest: Effort normal and breath sounds normal. No respiratory distress. He has no wheezes. He has no rales.  Abdominal: Soft. He exhibits no distension. There is no tenderness.  Genitourinary: deferred  Musculoskeletal: He exhibits no edema.  Lymphadenopathy:   He has no cervical adenopathy.  Skin: Skin is warm and dry. He is not diaphoretic.  Psychiatric: He has a normal mood and affect. His behavior is normal.         Assessment & Plan:   Physical exam: Screening blood work  ordered Exercise    Weight   Substance abuse   none   Reviewed recommended immunizations.   Health Maintenance  Topic Date Due  . Zoster Vaccines- Shingrix (1 of 2) Never done  . FOOT EXAM  04/03/2019  . HEMOGLOBIN A1C  05/31/2021  . OPHTHALMOLOGY EXAM  10/18/2021  . TETANUS/TDAP  08/12/2024  . Pneumonia Vaccine 2+ Years old  Completed  . INFLUENZA VACCINE  Completed  . COVID-19 Vaccine  Completed  . Hepatitis C Screening  Completed  . HPV VACCINES  Aged Out      See Problem List for Assessment and Plan of chronic medical problems.    This encounter was created in error - please disregard.

## 2020-10-29 ENCOUNTER — Encounter: Payer: Medicare PPO | Admitting: Internal Medicine

## 2020-10-29 ENCOUNTER — Ambulatory Visit: Payer: Medicare PPO

## 2020-10-29 DIAGNOSIS — I480 Paroxysmal atrial fibrillation: Secondary | ICD-10-CM

## 2020-10-29 DIAGNOSIS — F419 Anxiety disorder, unspecified: Secondary | ICD-10-CM

## 2020-10-29 DIAGNOSIS — I10 Essential (primary) hypertension: Secondary | ICD-10-CM

## 2020-10-29 DIAGNOSIS — Z0000 Encounter for general adult medical examination without abnormal findings: Secondary | ICD-10-CM

## 2020-10-29 DIAGNOSIS — Z8739 Personal history of other diseases of the musculoskeletal system and connective tissue: Secondary | ICD-10-CM

## 2020-10-29 DIAGNOSIS — I25810 Atherosclerosis of coronary artery bypass graft(s) without angina pectoris: Secondary | ICD-10-CM

## 2020-11-08 ENCOUNTER — Other Ambulatory Visit: Payer: Self-pay | Admitting: Internal Medicine

## 2020-11-18 ENCOUNTER — Encounter: Payer: Self-pay | Admitting: Internal Medicine

## 2020-11-18 NOTE — Progress Notes (Signed)
Outside notes received. Information abstracted. Notes sent to scan.  

## 2020-11-30 ENCOUNTER — Encounter: Payer: Self-pay | Admitting: Internal Medicine

## 2020-11-30 DIAGNOSIS — M5442 Lumbago with sciatica, left side: Secondary | ICD-10-CM | POA: Diagnosis not present

## 2020-11-30 NOTE — Progress Notes (Signed)
Subjective:    Patient ID: Bryce Maldonado, male    DOB: [birth year], 80 y.o.   MRN: [id number]   This visit occurred during the SARS-CoV-2 public health emergency.  Safety protocols were in place, including screening questions prior to the visit, additional usage of staff PPE, and extensive cleaning of exam room while observing appropriate contact time as indicated for disinfecting solutions.   HPI He is here for a physical exam.  He is here with his wife.  Will have cataract removed soon.   Chronic back pain -- related to OA.  Not a surgical candidate.  Does not take anything for the pain.    Medications and allergies reviewed with patient and updated if appropriate.  Patient Active Problem List   Diagnosis Date Noted   Tinea cruris 06/02/2019   Toe pain 04/03/2018   Mild aortic stenosis 10/02/2017   Hypertension 09/27/2016   Anxiety 03/27/2016   SVT (supraventricular tachycardia) ([facility]) 12/04/2014   Typical atrial flutter ([facility]) 11/03/2014   PAF (paroxysmal atrial fibrillation) ([place]) 08/13/2014   Diabetes ([place]) 04/22/2013   Gout 03/04/2009   ATOPIC RHINITIS 03/04/2009   PROSTATE CANCER, HX OF 03/04/2009   Mixed hyperlipidemia 08/06/2006   Coronary atherosclerosis 08/06/2006    Current Outpatient Medications on File Prior to Visit  Medication Sig Dispense Refill   allopurinol (ZYLOPRIM) 300 MG tablet TAKE 1 TABLET(300 MG) BY MOUTH DAILY 90 tablet 1   ELIQUIS 5 MG TABS tablet TAKE 1 TABLET(5 MG) BY MOUTH TWICE DAILY 60 tablet 11   ezetimibe (ZETIA) 10 MG tablet TAKE 1 TABLET(10 MG) BY MOUTH AT BEDTIME 90 tablet 3   JARDIANCE 25 MG TABS tablet TAKE 1 TABLET(25 MG) BY MOUTH DAILY 90 tablet 1   losartan (COZAAR) 25 MG tablet TAKE 1 TABLET(25 MG) BY MOUTH DAILY 90 tablet 1   Melatonin 5 MG TABS Take 5 mg by mouth at bedtime.     metFORMIN (GLUCOPHAGE) 500 MG tablet TAKE 2 TABLETS(1000 MG) BY MOUTH TWICE DAILY WITH A MEAL 360 tablet 1   Multiple Vitamins-Minerals (CENTRUM PO)  Take 1 tablet by mouth daily.     rosuvastatin (CRESTOR) 10 MG tablet TAKE 1 TABLET BY MOUTH AT BEDTIME 90 tablet 0   venlafaxine XR (EFFEXOR-XR) 37.5 MG 24 hr capsule TAKE 1 CAPSULE(37.5 MG) BY MOUTH DAILY WITH BREAKFAST 30 capsule 5   glucose blood (ONETOUCH VERIO) test strip 1 each by Other route 2 (two) times daily. Use to check  Blood sugars twice a day Dx E11.9 (Patient not taking: No sig reported) 100 each 3   ONETOUCH DELICA LANCETS 37J MISC Use to help check blood sugars twice a day Dx E11.9 (Patient not taking: No sig reported) 100 each 3   No current facility-administered medications on file prior to visit.    Past Medical History:  Diagnosis Date   Anemia    BPH (benign prostatic hyperplasia)    Elevated PSA   Childhood asthma    Coronary artery disease    post CABG x4 -- in 1995 -- due to three-vessel coronary artery disease   Diverticular disease    Dyslipidemia    First degree AV block    Gout    Osteoarthritis    "everywhere" (12/04/2014)   Prostate cancer [facility]) 2012   Dr Karsten Ro   Spinal stenosis 2004   L4 radiculopathy, herniated nucleus pulposus L4-5   Tinnitus    Type II diabetes mellitus ([facility])    Typical atrial flutter ([place])  s/p ablation    Past Surgical History:  Procedure Laterality Date   APPENDECTOMY  1943   ATRIAL FLUTTER ABLATION  12/04/2014   BACK SURGERY     CARDIAC CATHETERIZATION  02/10/2010   Est. EF of 60-65% -- Severe three-vessel obstructive atherosclerotic coronary artery disease -- All grafts were patent including left internal mammary artery graft to left anterior descending coronary artery, saphenous vein graft to the diagonal, saphenous vein graft to obtuse marginal vessel, and saphenous vein graft to the posterior descending coronary artery -- Normal left ventricular function    COLONOSCOPY     negative X3; Dr Carlean Purl   CORONARY ARTERY BYPASS GRAFT  10/21/1993   x4 -- using the LIM artery graft to the LAD artery, with saphenous  vein grafts to the diagonal branch of the LAD, OM branch the left circumflex coronary artery, and the posterior descending branch of the RCA -- Est. EF of 65%-- Surgeon: Gaye Pollack, M.D.               ELECTROPHYSIOLOGIC STUDY N/A 10/01/2014   CTI ablation by Dr Curt Bears   ELECTROPHYSIOLOGIC STUDY N/A 12/04/2014   repeat CTI ablation by Dr Rayann Heman   FLEXIBLE SIGMOIDOSCOPY  2005    external hemorrhoids   JOINT REPLACEMENT     MICRODISCECTOMY LUMBAR  2004   L4-5, Dr. Arnoldo Morale   PROSTATE BIOPSY  10/2008   "had radiation tx for prostate cancer"   SALIVARY STONE REMOVAL  2001   TOTAL HIP ARTHROPLASTY Right November 2010   TOTAL HIP ARTHROPLASTY Left 03/2010   Dr Alvan Dame    Social History   Socioeconomic History   Marital status: Married    Spouse name: Not on file   Number of children: 2   Years of education: 16   Highest education level: Not on file  Occupational History   Occupation: retired  Tobacco Use   Smoking status: Former    Packs/day: 2.50    Years: 15.00    Pack years: 37.50    Types: Cigarettes    Quit date: 01/30/1970    Years since quitting: 50.8   Smokeless tobacco: Never   Tobacco comments:    smoked 1959-1972  Vaping Use   Vaping Use: Never used  Substance and Sexual Activity   Alcohol use: Yes    Alcohol/week: 4.0 standard drinks    Types: 4 Standard drinks or equivalent per week    Comment: 11/42016 "nothing to drink since 07/25/2014"   Drug use: No   Sexual activity: Not Currently  Other Topics Concern   Not on file  Social History Narrative   Pt lives in [place] with spouse. 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  Enjoys reading and walking.   Left handed   Town house   Social Determinants of Health   Financial Resource Strain: Low Risk    Difficulty of Paying Living Expenses: Not hard at all  Food Insecurity: No Food Insecurity   Worried About Charity fundraiser in the Last Year: Never true   Arboriculturist in the Last Year: Never true   Transportation Needs: No Transportation Needs   Lack of Transportation (Medical): No   Lack of Transportation (Non-Medical): No  Physical Activity: Sufficiently Active   Days of Exercise per Week: 7 days   Minutes of Exercise per Session: 30 min  Stress: No Stress Concern Present   Feeling of Stress : Not at all  Social Connections: Socially Integrated   Frequency of Communication  with Friends and Family: More than three times a week   Frequency of Social Gatherings with Friends and Family: More than three times a week   Attends Religious Services: More than 4 times per year   Active Member of [employer] or Organizations: Yes   Attends Music therapist: More than 4 times per year   Marital Status: Married    Family History  Problem Relation Age of Onset   Diabetes Father    Hypertension Father    Lung cancer Father        smoker   Heart failure Mother 15   Hyperlipidemia Sister    Coronary artery disease Sister    Heart attack Paternal Aunt        X 2; both > 57   Colon cancer Neg Hx    Stroke Neg Hx     Review of Systems  Constitutional:  Negative for chills and fever.  Eyes:  Positive for visual disturbance (cataracts).  Respiratory:  Negative for cough, shortness of breath and wheezing.   Cardiovascular:  Negative for chest pain, palpitations and leg swelling.  Gastrointestinal:  Negative for abdominal pain, blood in stool, constipation, diarrhea and nausea.       No gerd  Genitourinary:  Negative for difficulty urinating, dysuria and hematuria.  Musculoskeletal:  Positive for arthralgias (mild) and back pain (chronic).  Skin:  Negative for color change and rash.  Neurological:  Negative for light-headedness and headaches.  Psychiatric/Behavioral:  Negative for dysphoric mood and sleep disturbance. The patient is not nervous/anxious.       Objective:   Vitals:   12/01/20 0906  BP: 122/70  Pulse: 76  Temp: 98.2 F (36.8 C)  SpO2: 94%   Filed Weights    12/01/20 0906  Weight: 189 lb 3.2 oz (85.8 kg)   Body mass index is 25.66 kg/m.  BP Readings from Last 3 Encounters:  12/01/20 122/70  12/01/20 122/70  04/26/20 130/82    Wt Readings from Last 3 Encounters:  12/01/20 189 lb 3.2 oz (85.8 kg)  12/01/20 189 lb 3.2 oz (85.8 kg)  04/26/20 191 lb (86.6 kg)     Physical Exam Constitutional: He appears well-developed and well-nourished. No distress.  HENT:  Head: Normocephalic and atraumatic.  Right Ear: External ear normal.  Left Ear: External ear normal.  Mouth/Throat: Oropharynx is clear and moist.  Normal ear canals and TM b/l  Eyes: Conjunctivae and EOM are normal.  Neck: Neck supple. No tracheal deviation present. No thyromegaly present.  No carotid bruit  Cardiovascular: Normal rate, regular rhythm, normal heart sounds and intact distal pulses.   No murmur heard. Pulmonary/Chest: Effort normal and breath sounds normal. No respiratory distress. He has no wheezes. He has no rales.  Abdominal: Soft. He exhibits no distension. There is no tenderness.  Genitourinary: deferred  Musculoskeletal: He exhibits no edema.  Lymphadenopathy:   He has no cervical adenopathy.  Skin: Skin is warm and dry. He is not diaphoretic.  Psychiatric: He has a normal mood and affect. His behavior is normal.         Assessment & Plan:   Physical exam: Screening blood work  ordered Exercise   walking, back exercises Weight   ok for age Substance abuse   none   Reviewed recommended immunizations.   Health Maintenance  Topic Date Due   Hepatitis C Screening  Never done   Zoster Vaccines- Shingrix (1 of 2) Never done   FOOT EXAM  04/03/2019  HEMOGLOBIN A1C  10/27/2020   OPHTHALMOLOGY EXAM  10/18/2021   TETANUS/TDAP  08/12/2024   Pneumonia Vaccine 51+ Years old  Completed   INFLUENZA VACCINE  Completed   COVID-19 Vaccine  Completed   HPV VACCINES  Aged Out     See Problem List for Assessment and Plan of chronic medical  problems.

## 2020-11-30 NOTE — Patient Instructions (Addendum)
Blood work was ordered.     Medications changes include :   clobetasol shampoo  Your prescription(s) have been submitted to your pharmacy. Please take as directed and contact our office if you believe you are having problem(s) with the medication(s).    Please followup in 6 months   Health Maintenance, Male Adopting a healthy lifestyle and getting preventive care are important in promoting health and wellness. Ask your health care provider about: The right schedule for you to have regular tests and exams. Things you can do on your own to prevent diseases and keep yourself healthy. What should I know about diet, weight, and exercise? Eat a healthy diet  Eat a diet that includes plenty of vegetables, fruits, low-fat dairy products, and lean protein. Do not eat a lot of foods that are high in solid fats, added sugars, or sodium. Maintain a healthy weight Body mass index (BMI) is a measurement that can be used to identify possible weight problems. It estimates body fat based on height and weight. Your health care provider can help determine your BMI and help you achieve or maintain a healthy weight. Get regular exercise Get regular exercise. This is one of the most important things you can do for your health. Most adults should: Exercise for at least 150 minutes each week. The exercise should increase your heart rate and make you sweat (moderate-intensity exercise). Do strengthening exercises at least twice a week. This is in addition to the moderate-intensity exercise. Spend less time sitting. Even light physical activity can be beneficial. Watch cholesterol and blood lipids Have your blood tested for lipids and cholesterol at 79 years of age, then have this test every 5 years. You may need to have your cholesterol levels checked more often if: Your lipid or cholesterol levels are high. You are older than 79 years of age. You are at high risk for heart disease. What should I know  about cancer screening? Many types of cancers can be detected early and may often be prevented. Depending on your health history and family history, you may need to have cancer screening at various ages. This may include screening for: Colorectal cancer. Prostate cancer. Skin cancer. Lung cancer. What should I know about heart disease, diabetes, and high blood pressure? Blood pressure and heart disease High blood pressure causes heart disease and increases the risk of stroke. This is more likely to develop in people who have high blood pressure readings, are of African descent, or are overweight. Talk with your health care provider about your target blood pressure readings. Have your blood pressure checked: Every 3-5 years if you are 25-47 years of age. Every year if you are 12 years old or older. If you are between the ages of 65 and 82 and are a current or former smoker, ask your health care provider if you should have a one-time screening for abdominal aortic aneurysm (AAA). Diabetes Have regular diabetes screenings. This checks your fasting blood sugar level. Have the screening done: Once every three years after age 70 if you are at a normal weight and have a low risk for diabetes. More often and at a younger age if you are overweight or have a high risk for diabetes. What should I know about preventing infection? Hepatitis B If you have a higher risk for hepatitis B, you should be screened for this virus. Talk with your health care provider to find out if you are at risk for hepatitis B infection. Hepatitis C Blood  testing is recommended for: Everyone born from [birth year] through 1964. Anyone with known risk factors for hepatitis C. Sexually transmitted infections (STIs) You should be screened each year for STIs, including gonorrhea and chlamydia, if: You are sexually active and are younger than 79 years of age. You are older than 79 years of age and your health care provider tells you  that you are at risk for this type of infection. Your sexual activity has changed since you were last screened, and you are at increased risk for chlamydia or gonorrhea. Ask your health care provider if you are at risk. Ask your health care provider about whether you are at high risk for HIV. Your health care provider may recommend a prescription medicine to help prevent HIV infection. If you choose to take medicine to prevent HIV, you should first get tested for HIV. You should then be tested every 3 months for as long as you are taking the medicine. Follow these instructions at home: Lifestyle Do not use any products that contain nicotine or tobacco, such as cigarettes, e-cigarettes, and chewing tobacco. If you need help quitting, ask your health care provider. Do not use street drugs. Do not share needles. Ask your health care provider for help if you need support or information about quitting drugs. Alcohol use Do not drink alcohol if your health care provider tells you not to drink. If you drink alcohol: Limit how much you have to 0-2 drinks a day. Be aware of how much alcohol is in your drink. In the [place] one drink equals one 12 oz bottle of beer (355 mL), one 5 oz glass of wine (148 mL), or one 1 oz glass of hard liquor (44 mL). General instructions Schedule regular health, dental, and eye exams. Stay current with your vaccines. Tell your health care provider if: You often feel depressed. You have ever been abused or do not feel safe at home. Summary Adopting a healthy lifestyle and getting preventive care are important in promoting health and wellness. Follow your health care provider's instructions about healthy diet, exercising, and getting tested or screened for diseases. Follow your health care provider's instructions on monitoring your cholesterol and blood pressure. This information is not intended to replace advice given to you by your health care provider. Make sure you  discuss any questions you have with your health care provider. Document Revised: 03/26/2020 Document Reviewed: 01/09/2018 Elsevier Patient Education  2022 [employer].

## 2020-12-01 ENCOUNTER — Ambulatory Visit (INDEPENDENT_AMBULATORY_CARE_PROVIDER_SITE_OTHER): Payer: Medicare PPO

## 2020-12-01 ENCOUNTER — Ambulatory Visit: Payer: Medicare PPO

## 2020-12-01 ENCOUNTER — Telehealth: Payer: Self-pay | Admitting: Internal Medicine

## 2020-12-01 ENCOUNTER — Ambulatory Visit (INDEPENDENT_AMBULATORY_CARE_PROVIDER_SITE_OTHER): Payer: Medicare PPO | Admitting: Internal Medicine

## 2020-12-01 ENCOUNTER — Other Ambulatory Visit: Payer: Self-pay

## 2020-12-01 VITALS — BP 122/70 | HR 76 | Temp 98.2°F | Ht 72.0 in | Wt 189.2 lb

## 2020-12-01 DIAGNOSIS — F419 Anxiety disorder, unspecified: Secondary | ICD-10-CM

## 2020-12-01 DIAGNOSIS — I25810 Atherosclerosis of coronary artery bypass graft(s) without angina pectoris: Secondary | ICD-10-CM

## 2020-12-01 DIAGNOSIS — M1A9XX0 Chronic gout, unspecified, without tophus (tophi): Secondary | ICD-10-CM

## 2020-12-01 DIAGNOSIS — E782 Mixed hyperlipidemia: Secondary | ICD-10-CM

## 2020-12-01 DIAGNOSIS — E1159 Type 2 diabetes mellitus with other circulatory complications: Secondary | ICD-10-CM

## 2020-12-01 DIAGNOSIS — Z1159 Encounter for screening for other viral diseases: Secondary | ICD-10-CM | POA: Diagnosis not present

## 2020-12-01 DIAGNOSIS — Z0000 Encounter for general adult medical examination without abnormal findings: Secondary | ICD-10-CM

## 2020-12-01 DIAGNOSIS — I10 Essential (primary) hypertension: Secondary | ICD-10-CM

## 2020-12-01 DIAGNOSIS — I480 Paroxysmal atrial fibrillation: Secondary | ICD-10-CM | POA: Diagnosis not present

## 2020-12-01 DIAGNOSIS — L299 Pruritus, unspecified: Secondary | ICD-10-CM

## 2020-12-01 LAB — COMPREHENSIVE METABOLIC PANEL
ALT: 16 U/L (ref 0–53)
AST: 15 U/L (ref 0–37)
Albumin: 5 g/dL (ref 3.5–5.2)
Alkaline Phosphatase: 47 U/L (ref 39–117)
BUN: 19 mg/dL (ref 6–23)
CO2: 30 mEq/L (ref 19–32)
Calcium: 10.3 mg/dL (ref 8.4–10.5)
Chloride: 100 mEq/L (ref 96–112)
Creatinine, Ser: 0.78 mg/dL (ref 0.40–1.50)
GFR: 85.03 mL/min (ref >60.00)
Glucose, Bld: 125 mg/dL — ABNORMAL HIGH (ref 70–99)
Potassium: 4.5 mEq/L (ref 3.5–5.1)
Sodium: 139 mEq/L (ref 135–145)
Total Bilirubin: 0.7 mg/dL (ref 0.2–1.2)
Total Protein: 7.9 g/dL (ref 6.0–8.3)

## 2020-12-01 LAB — HEPATITIS C ANTIBODY
Hepatitis C Ab: NONREACTIVE
SIGNAL TO CUT-OFF: 0.04 (ref <1.00)

## 2020-12-01 LAB — LIPID PANEL
Cholesterol: 131 mg/dL (ref 0–200)
HDL: 56.1 mg/dL (ref >39.00)
LDL Cholesterol: 56 mg/dL (ref 0–99)
NonHDL: 75.1
Total CHOL/HDL Ratio: 2
Triglycerides: 96 mg/dL (ref 0.0–149.0)
VLDL: 19.2 mg/dL (ref 0.0–40.0)

## 2020-12-01 LAB — TSH: TSH: 1.08 u[IU]/mL (ref 0.35–5.50)

## 2020-12-01 LAB — CBC WITH DIFFERENTIAL/PLATELET
Basophils Absolute: 0.1 10*3/uL (ref 0.0–0.1)
Basophils Relative: 1.3 % (ref 0.0–3.0)
Eosinophils Absolute: 0.2 10*3/uL (ref 0.0–0.7)
Eosinophils Relative: 3 % (ref 0.0–5.0)
HCT: 45.6 % (ref 39.0–52.0)
Hemoglobin: 15.3 g/dL (ref 13.0–17.0)
Lymphocytes Relative: 22.7 % (ref 12.0–46.0)
Lymphs Abs: 1.3 10*3/uL (ref 0.7–4.0)
MCHC: 33.6 g/dL (ref 30.0–36.0)
MCV: 93.1 fl (ref 78.0–100.0)
Monocytes Absolute: 0.6 10*3/uL (ref 0.1–1.0)
Monocytes Relative: 11.3 % (ref 3.0–12.0)
Neutro Abs: 3.5 10*3/uL (ref 1.4–7.7)
Neutrophils Relative %: 61.7 % (ref 43.0–77.0)
Platelets: 245 10*3/uL (ref 150.0–400.0)
RBC: 4.9 Mil/uL (ref 4.22–5.81)
RDW: 14.1 % (ref 11.5–15.5)
WBC: 5.6 10*3/uL (ref 4.0–10.5)

## 2020-12-01 LAB — MICROALBUMIN / CREATININE URINE RATIO
Creatinine,U: 46.9 mg/dL
Microalb Creat Ratio: 1.5 mg/g (ref 0.0–30.0)
Microalb, Ur: <0.7 mg/dL (ref 0.0–1.9)

## 2020-12-01 LAB — HEMOGLOBIN A1C: Hgb A1c MFr Bld: 6.9 % — ABNORMAL HIGH (ref 4.6–6.5)

## 2020-12-01 MED ORDER — CLOBETASOL PROPIONATE 0.05 % EX SOLN: CUTANEOUS | 0 refills | Status: DC

## 2020-12-01 MED ORDER — CLOBETASOL PROPIONATE 0.05 % EX SHAM: MEDICATED_SHAMPOO | CUTANEOUS | 0 refills | Status: DC

## 2020-12-01 MED ORDER — VENLAFAXINE HCL ER 37.5 MG PO CP24: ORAL_CAPSULE | ORAL | 3 refills | Status: DC

## 2020-12-01 NOTE — Progress Notes (Signed)
Subjective:   Bryce Maldonado is a 80 y.o. male who presents for Medicare Annual/Subsequent preventive examination.  Review of Systems     Cardiac Risk Factors include: advanced age (>35men, >65 women);diabetes mellitus;dyslipidemia;family history of premature cardiovascular disease;hypertension;male gender     Objective:    Today's Vitals   12/01/20 0848  BP: 122/70  Pulse: 76  Temp: 98.2 F (36.8 C)  SpO2: 94%  Weight: 189 lb 3.2 oz (85.8 kg)  Height: 6' (1.829 m)  PainSc: 0-No pain   Body mass index is 25.66 kg/m.  Advanced Directives 12/01/2020 12/20/2018 03/29/2017 02/02/2015 12/04/2014 12/04/2014 10/01/2014  Does Patient Have a Medical Advance Directive? Yes Yes Yes No Yes Yes Yes  Type of Advance Directive Living will;Healthcare Power of [place];Living will - - [place];Living will [place];Living will  Does patient want to make changes to medical advance directive? No - Patient declined - - - No - Patient declined - No - Patient declined  Copy of [place] in Chart? No - copy requested - No - copy requested - No - copy requested - Yes  Would patient like information on creating a medical advance directive? - - - No - patient declined information - - -    Current Medications (verified) Outpatient Encounter Medications as of 12/01/2020  Medication Sig   allopurinol (ZYLOPRIM) 300 MG tablet TAKE 1 TABLET(300 MG) BY MOUTH DAILY   ELIQUIS 5 MG TABS tablet TAKE 1 TABLET(5 MG) BY MOUTH TWICE DAILY   ezetimibe (ZETIA) 10 MG tablet TAKE 1 TABLET(10 MG) BY MOUTH AT BEDTIME   JARDIANCE 25 MG TABS tablet TAKE 1 TABLET(25 MG) BY MOUTH DAILY   losartan (COZAAR) 25 MG tablet TAKE 1 TABLET(25 MG) BY MOUTH DAILY   Melatonin 5 MG TABS Take 5 mg by mouth at bedtime.   metFORMIN (GLUCOPHAGE) 500 MG tablet TAKE 2 TABLETS(1000 MG) BY MOUTH TWICE DAILY WITH A MEAL   Multiple Vitamins-Minerals (CENTRUM  PO) Take 1 tablet by mouth daily.   rosuvastatin (CRESTOR) 10 MG tablet TAKE 1 TABLET BY MOUTH AT BEDTIME   venlafaxine XR (EFFEXOR-XR) 37.5 MG 24 hr capsule TAKE 1 CAPSULE(37.5 MG) BY MOUTH DAILY WITH BREAKFAST   glucose blood (ONETOUCH VERIO) test strip 1 each by Other route 2 (two) times daily. Use to check  Blood sugars twice a day Dx E11.9 (Patient not taking: No sig reported)   ONETOUCH DELICA LANCETS 51Z MISC Use to help check blood sugars twice a day Dx E11.9 (Patient not taking: No sig reported)   No facility-administered encounter medications on file as of 12/01/2020.    Allergies (verified) Naproxen sodium   History: Past Medical History:  Diagnosis Date   Anemia    BPH (benign prostatic hyperplasia)    Elevated PSA   Childhood asthma    Coronary artery disease    post CABG x4 -- in 1995 -- due to three-vessel coronary artery disease   Diverticular disease    Dyslipidemia    First degree AV block    Gout    Osteoarthritis    "everywhere" (12/04/2014)   Prostate cancer [facility]) 2012   Dr Karsten Ro   Spinal stenosis 2004   L4 radiculopathy, herniated nucleus pulposus L4-5   Tinnitus    Type II diabetes mellitus (HCC)    Typical atrial flutter ([place])    s/p ablation   Past Surgical History:  Procedure Laterality Date   APPENDECTOMY  [place]  12/04/2014   BACK SURGERY     CARDIAC CATHETERIZATION  02/10/2010   Est. EF of 60-65% -- Severe three-vessel obstructive atherosclerotic coronary artery disease -- All grafts were patent including left internal mammary artery graft to left anterior descending coronary artery, saphenous vein graft to the diagonal, saphenous vein graft to obtuse marginal vessel, and saphenous vein graft to the posterior descending coronary artery -- Normal left ventricular function    COLONOSCOPY     negative X3; Dr Carlean Purl   CORONARY ARTERY BYPASS GRAFT  10/21/1993   x4 -- using the LIM artery graft to the LAD artery, with  saphenous vein grafts to the diagonal branch of the LAD, OM branch the left circumflex coronary artery, and the posterior descending branch of the RCA -- Est. EF of 65%-- Surgeon: Gaye Pollack, M.D.               ELECTROPHYSIOLOGIC STUDY N/A 10/01/2014   CTI ablation by Dr Curt Bears   ELECTROPHYSIOLOGIC STUDY N/A 12/04/2014   repeat CTI ablation by Dr Rayann Heman   FLEXIBLE SIGMOIDOSCOPY  2005    external hemorrhoids   JOINT REPLACEMENT     MICRODISCECTOMY LUMBAR  2004   L4-5, Dr. Arnoldo Morale   PROSTATE BIOPSY  10/2008   "had radiation tx for prostate cancer"   SALIVARY STONE REMOVAL  2001   TOTAL HIP ARTHROPLASTY Right November 2010   TOTAL HIP ARTHROPLASTY Left 03/2010   Dr Alvan Dame   Family History  Problem Relation Age of Onset   Diabetes Father    Hypertension Father    Lung cancer Father        smoker   Heart failure Mother 44   Hyperlipidemia Sister    Coronary artery disease Sister    Heart attack Paternal Aunt        X 2; both > 51   Colon cancer Neg Hx    Stroke Neg Hx    Social History   Socioeconomic History   Marital status: Married    Spouse name: Not on file   Number of children: 2   Years of education: 16   Highest education level: Not on file  Occupational History   Occupation: retired  Tobacco Use   Smoking status: Former    Packs/day: 2.50    Years: 15.00    Pack years: 37.50    Types: Cigarettes    Quit date: 01/30/1970    Years since quitting: 50.8   Smokeless tobacco: Never   Tobacco comments:    smoked 1959-1972  Vaping Use   Vaping Use: Never used  Substance and Sexual Activity   Alcohol use: Yes    Alcohol/week: 4.0 standard drinks    Types: 4 Standard drinks or equivalent per week    Comment: 11/42016 "nothing to drink since 07/25/2014"   Drug use: No   Sexual activity: Not Currently  Other Topics Concern   Not on file  Social History Narrative   Pt lives in [place] with spouse. 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  Enjoys reading and walking.    Left handed   Town house   Social Determinants of Health   Financial Resource Strain: Low Risk    Difficulty of Paying Living Expenses: Not hard at all  Food Insecurity: No Food Insecurity   Worried About Charity fundraiser in the Last Year: Never true   Ran Out of Food in the Last Year: Never true  Transportation Needs:  No Transportation Needs   Lack of Transportation (Medical): No   Lack of Transportation (Non-Medical): No  Physical Activity: Sufficiently Active   Days of Exercise per Week: 7 days   Minutes of Exercise per Session: 30 min  Stress: No Stress Concern Present   Feeling of Stress : Not at all  Social Connections: Socially Integrated   Frequency of Communication with Friends and Family: More than three times a week   Frequency of Social Gatherings with Friends and Family: More than three times a week   Attends Religious Services: More than 4 times per year   Active Member of [employer] or Organizations: Yes   Attends Music therapist: More than 4 times per year   Marital Status: Married    Tobacco Counseling Counseling given: Not Answered Tobacco comments: smoked 1959-1972   Clinical Intake:  Pre-visit preparation completed: Yes  Pain : No/denies pain Pain Score: 0-No pain     BMI - recorded: 25.66 Nutritional Status: BMI 25 -29 Overweight Nutritional Risks: None Diabetes: Yes CBG done?: No Did pt. bring in CBG monitor from home?: No  How often do you need to have someone help you when you read instructions, pamphlets, or other written materials from your doctor or pharmacy?: 1 - Never What is the last grade level you completed in school?: Graduate School  Diabetic? yes  Interpreter Needed?: No  Information entered by :: Lisette Abu, LPN   Activities of Daily Living In your present state of health, do you have any difficulty performing the following activities: 12/01/2020 04/26/2020  Hearing? N N  Vision? N N  Difficulty  concentrating or making decisions? N N  Walking or climbing stairs? N N  Dressing or bathing? N N  Doing errands, shopping? N N  Preparing Food and eating ? N -  Using the Toilet? N -  In the past six months, have you accidently leaked urine? N -  Do you have problems with loss of bowel control? N -  Managing your Medications? N -  Managing your Finances? N -  Housekeeping or managing your Housekeeping? N -  Some recent data might be hidden    Patient Care Team: Binnie Rail, MD as PCP - General (Internal Medicine) [place], Peter M, MD as PCP - Cardiology (Cardiology) Alda Berthold, DO as Consulting Physician (Neurology)  Indicate any recent Medical Services you may have received from other than [facility] providers in the past year (date may be approximate).     Assessment:   This is a routine wellness examination for April.  Hearing/Vision screen Hearing Screening - Comments:: Patient denied any hearing difficulty.   No hearing aids.  Vision Screening - Comments:: Patient wears corrective glasses/contacts.  Eye exam done annually by: [facility]  Dietary issues and exercise activities discussed: Current Exercise Habits: Home exercise routine, Type of exercise: walking, Time (Minutes): 30, Frequency (Times/Week): 7, Weekly Exercise (Minutes/Week): 210, Intensity: Moderate, Exercise limited by: None identified   Goals Addressed               This Visit's Progress     Diabetes Patient stated goal (pt-stated)        To reach my HgA1C goal of 5.7%      Depression Screen PHQ 2/9 Scores 12/01/2020 04/26/2020 04/23/2019 04/03/2018 10/02/2017 03/29/2017 09/27/2016  PHQ - 2 Score 0 0 0 0 0 0 0  PHQ- 9 Score - 0 - - 0 0 -    Fall Risk  Fall Risk  12/01/2020 04/26/2020 04/23/2019 12/20/2018 04/03/2018  Falls in the past year? 0 0 0 0 0  Number falls in past yr: 0 0 0 0 -  Injury with Fall? 0 0 0 0 -  Risk for fall due to : No Fall Risks No Fall Risks - - -  Follow up Falls  evaluation completed Falls evaluation completed - - -    FALL RISK PREVENTION PERTAINING TO THE HOME:  Any stairs in or around the home? Yes  If so, are there any without handrails? No  Home free of loose throw rugs in walkways, pet beds, electrical cords, etc? Yes  Adequate lighting in your home to reduce risk of falls? Yes   ASSISTIVE DEVICES UTILIZED TO PREVENT FALLS:  Life alert? No  Use of a cane, walker or w/c? No  Grab bars in the bathroom? Yes  Shower chair or bench in shower? Yes  Elevated toilet seat or a handicapped toilet? Yes   TIMED UP AND GO:  Was the test performed? Yes .  Length of time to ambulate 10 feet: 5 sec.   Gait steady and fast without use of assistive device  Cognitive Function: Normal cognitive status assessed by direct observation by this Nurse Health Advisor. No abnormalities found.          Immunizations Immunization History  Administered Date(s) Administered   Fluad Quad(high Dose 65+) 10/24/2018, 10/27/2019, 11/24/2020   Influenza Whole 11/11/2007, 11/03/2008   Influenza, High Dose Seasonal PF 12/04/2013, 09/27/2015, 10/02/2017   Influenza-Unspecified 11/08/2012, 11/03/2014, 10/15/2015   PFIZER(Purple Top)SARS-COV-2 Vaccination 02/20/2019, 03/13/2019, 11/15/2019, 05/19/2020   Pfizer Covid-19 Vaccine Bivalent Booster 55yrs & up 11/04/2020   Pneumococcal Conjugate-13 11/03/2014   Pneumococcal Polysaccharide-23 04/09/2013   Tdap 08/13/2014   Zoster, Live 06/24/2010    TDAP status: Up to date  Flu Vaccine status: Up to date  Pneumococcal vaccine status: Up to date  Covid-19 vaccine status: Completed vaccines  Qualifies for Shingles Vaccine? Yes   Zostavax completed Yes   Shingrix Completed?: No.    Education has been provided regarding the importance of this vaccine. Patient has been advised to call insurance company to determine out of pocket expense if they have not yet received this vaccine. Advised may also receive vaccine at  local pharmacy or Health Dept. Verbalized acceptance and understanding.  Screening Tests Health Maintenance  Topic Date Due   Hepatitis C Screening  Never done   Zoster Vaccines- Shingrix (1 of 2) Never done   FOOT EXAM  04/03/2019   HEMOGLOBIN A1C  10/27/2020   OPHTHALMOLOGY EXAM  10/18/2021   TETANUS/TDAP  08/12/2024   Pneumonia Vaccine 18+ Years old  Completed   INFLUENZA VACCINE  Completed   COVID-19 Vaccine  Completed   HPV VACCINES  Aged Out    Health Maintenance  Health Maintenance Due  Topic Date Due   Hepatitis C Screening  Never done   Zoster Vaccines- Shingrix (1 of 2) Never done   FOOT EXAM  04/03/2019   HEMOGLOBIN A1C  10/27/2020    Colorectal cancer screening: No longer required.   Lung Cancer Screening: (Low Dose CT Chest recommended if Age 60-80 years, 30 pack-year currently smoking OR have quit w/in 15years.) does not qualify.   Lung Cancer Screening Referral: no  Additional Screening:  Hepatitis C Screening: does qualify; Completed no  Vision Screening: Recommended annual ophthalmology exams for early detection of glaucoma and other disorders of the eye. Is the patient up to date with  their annual eye exam?  Yes  Who is the provider or what is the name of the office in which the patient attends annual eye exams? [facility] If pt is not established with a provider, would they like to be referred to a provider to establish care? No .   Dental Screening: Recommended annual dental exams for proper oral hygiene  Community Resource Referral / Chronic Care Management: CRR required this visit?  No   CCM required this visit?  No      Plan:     I have personally reviewed and noted the following in the patient's chart:   Medical and social history Use of alcohol, tobacco or illicit drugs  Current medications and supplements including opioid prescriptions. Patient is not currently taking opioid prescriptions. Functional ability and  status Nutritional status Physical activity Advanced directives List of other physicians Hospitalizations, surgeries, and ER visits in previous 12 months Vitals Screenings to include cognitive, depression, and falls Referrals and appointments  In addition, I have reviewed and discussed with patient certain preventive protocols, quality metrics, and best practice recommendations. A written personalized care plan for preventive services as well as general preventive health recommendations were provided to patient.     Sheral Flow, LPN   [id number]   Nurse Notes:  Hearing Screening - Comments:: Patient denied any hearing difficulty.   No hearing aids.  Vision Screening - Comments:: Patient wears corrective glasses/contacts.  Eye exam done annually by: [facility]

## 2020-12-01 NOTE — Assessment & Plan Note (Signed)
Chronic Follow-up cardiology No symptoms of angina On Eliquis, Zetia, Jardiance, losartan and Crestor

## 2020-12-01 NOTE — Telephone Encounter (Signed)
clobetasol (TEMOVATE) 0.05 % external solution  [facility] pharmacy calling regarding prescription sent over, based on the directions, would you prefer the patient to have the shampoo or solution, pharmacy states the solution and shampoo are two different things  Please update script and resubmit, or call John at [phone]

## 2020-12-01 NOTE — Assessment & Plan Note (Signed)
Chronic Ketoconazole shampoo was not effective Will try clobetasol shampoo Advised he may need to see dermatology for further evaluation and treatment

## 2020-12-01 NOTE — Assessment & Plan Note (Signed)
Chronic Check lipid panel  Continue crestor 10 mg daily Regular exercise and healthy diet encouraged  

## 2020-12-01 NOTE — Telephone Encounter (Signed)
New rx sent

## 2020-12-01 NOTE — Assessment & Plan Note (Signed)
Chronic Controlled, Stable Continue Effexor 37.5 mg daily 

## 2020-12-01 NOTE — Patient Instructions (Signed)
Bryce Maldonado , Thank you for taking time to come for your Medicare Wellness Visit. I appreciate your ongoing commitment to your health goals. Please review the following plan we discussed and let me know if I can assist you in the future.   Screening recommendations/referrals: Colonoscopy: Not a candidate for screening due to age Recommended yearly ophthalmology/optometry visit for glaucoma screening and checkup Recommended yearly dental visit for hygiene and checkup  Vaccinations: Influenza vaccine: 11/04/2020 Pneumococcal vaccine: 04/09/2013, 11/03/2014 Tdap vaccine: 08/13/2014; due every 10 years Shingrix vaccine: never done; Please call your insurance company to determine your out of pocket expense for the Shingrix vaccine. You may receive this vaccine at your local pharmacy. Covid-19: 02/20/2019, 03/13/2019, 11/15/2019, 05/19/2020, 11/04/2020  Advanced directives: Please bring a copy of your health care power of attorney and living will to the office at your convenience.  Conditions/risks identified: Yes; Client understands the importance of follow-up with providers by attending scheduled visits and discussed goals to eat healthier, increase physical activity, exercise the brain, socialize more, get enough sleep and make time for laughter.  Next appointment: Please schedule your next Medicare Wellness Visit with your Nurse Health Advisor in 1 year by calling [phone].  Preventive Care 73 Years and Older, Male Preventive care refers to lifestyle choices and visits with your health care provider that can promote health and wellness. What does preventive care include? A yearly physical exam. This is also called an annual well check. Dental exams once or twice a year. Routine eye exams. Ask your health care provider how often you should have your eyes checked. Personal lifestyle choices, including: Daily care of your teeth and gums. Regular physical activity. Eating a healthy  diet. Avoiding tobacco and drug use. Limiting alcohol use. Practicing safe sex. Taking low doses of aspirin every day. Taking vitamin and mineral supplements as recommended by your health care provider. What happens during an annual well check? The services and screenings done by your health care provider during your annual well check will depend on your age, overall health, lifestyle risk factors, and family history of disease. Counseling  Your health care provider may ask you questions about your: Alcohol use. Tobacco use. Drug use. Emotional well-being. Home and relationship well-being. Sexual activity. Eating habits. History of falls. Memory and ability to understand (cognition). Work and work Statistician. Screening  You may have the following tests or measurements: Height, weight, and BMI. Blood pressure. Lipid and cholesterol levels. These may be checked every 5 years, or more frequently if you are over 75 years old. Skin check. Lung cancer screening. You may have this screening every year starting at age 41 if you have a 30-pack-year history of smoking and currently smoke or have quit within the past 15 years. Fecal occult blood test (FOBT) of the stool. You may have this test every year starting at age 44. Flexible sigmoidoscopy or colonoscopy. You may have a sigmoidoscopy every 5 years or a colonoscopy every 10 years starting at age 23. Prostate cancer screening. Recommendations will vary depending on your family history and other risks. Hepatitis C blood test. Hepatitis B blood test. Sexually transmitted disease (STD) testing. Diabetes screening. This is done by checking your blood sugar (glucose) after you have not eaten for a while (fasting). You may have this done every 1-3 years. Abdominal aortic aneurysm (AAA) screening. You may need this if you are a current or former smoker. Osteoporosis. You may be screened starting at age 26 if you are at high risk.  Talk with  your health care provider about your test results, treatment options, and if necessary, the need for more tests. Vaccines  Your health care provider may recommend certain vaccines, such as: Influenza vaccine. This is recommended every year. Tetanus, diphtheria, and acellular pertussis (Tdap, Td) vaccine. You may need a Td booster every 10 years. Zoster vaccine. You may need this after age 66. Pneumococcal 13-valent conjugate (PCV13) vaccine. One dose is recommended after age 74. Pneumococcal polysaccharide (PPSV23) vaccine. One dose is recommended after age 77. Talk to your health care provider about which screenings and vaccines you need and how often you need them. This information is not intended to replace advice given to you by your health care provider. Make sure you discuss any questions you have with your health care provider. Document Released: 02/12/2015 Document Revised: 10/06/2015 Document Reviewed: 11/17/2014 Elsevier Interactive Patient Education  2017 [place] Prevention in the Home Falls can cause injuries. They can happen to people of all ages. There are many things you can do to make your home safe and to help prevent falls. What can I do on the outside of my home? Regularly fix the edges of walkways and driveways and fix any cracks. Remove anything that might make you trip as you walk through a door, such as a raised step or threshold. Trim any bushes or trees on the path to your home. Use bright outdoor lighting. Clear any walking paths of anything that might make someone trip, such as rocks or tools. Regularly check to see if handrails are loose or broken. Make sure that both sides of any steps have handrails. Any raised decks and porches should have guardrails on the edges. Have any leaves, snow, or ice cleared regularly. Use sand or salt on walking paths during winter. Clean up any spills in your garage right away. This includes oil or grease spills. What  can I do in the bathroom? Use night lights. Install grab bars by the toilet and in the tub and shower. Do not use towel bars as grab bars. Use non-skid mats or decals in the tub or shower. If you need to sit down in the shower, use a plastic, non-slip stool. Keep the floor dry. Clean up any water that spills on the floor as soon as it happens. Remove soap buildup in the tub or shower regularly. Attach bath mats securely with double-sided non-slip rug tape. Do not have throw rugs and other things on the floor that can make you trip. What can I do in the bedroom? Use night lights. Make sure that you have a light by your bed that is easy to reach. Do not use any sheets or blankets that are too big for your bed. They should not hang down onto the floor. Have a firm chair that has side arms. You can use this for support while you get dressed. Do not have throw rugs and other things on the floor that can make you trip. What can I do in the kitchen? Clean up any spills right away. Avoid walking on wet floors. Keep items that you use a lot in easy-to-reach places. If you need to reach something above you, use a strong step stool that has a grab bar. Keep electrical cords out of the way. Do not use floor polish or wax that makes floors slippery. If you must use wax, use non-skid floor wax. Do not have throw rugs and other things on the floor that can make  you trip. What can I do with my stairs? Do not leave any items on the stairs. Make sure that there are handrails on both sides of the stairs and use them. Fix handrails that are broken or loose. Make sure that handrails are as long as the stairways. Check any carpeting to make sure that it is firmly attached to the stairs. Fix any carpet that is loose or worn. Avoid having throw rugs at the top or bottom of the stairs. If you do have throw rugs, attach them to the floor with carpet tape. Make sure that you have a light switch at the top of the  stairs and the bottom of the stairs. If you do not have them, ask someone to add them for you. What else can I do to help prevent falls? Wear shoes that: Do not have high heels. Have rubber bottoms. Are comfortable and fit you well. Are closed at the toe. Do not wear sandals. If you use a stepladder: Make sure that it is fully opened. Do not climb a closed stepladder. Make sure that both sides of the stepladder are locked into place. Ask someone to hold it for you, if possible. Clearly mark and make sure that you can see: Any grab bars or handrails. First and last steps. Where the edge of each step is. Use tools that help you move around (mobility aids) if they are needed. These include: Canes. Walkers. Scooters. Crutches. Turn on the lights when you go into a dark area. Replace any light bulbs as soon as they burn out. Set up your furniture so you have a clear path. Avoid moving your furniture around. If any of your floors are uneven, fix them. If there are any pets around you, be aware of where they are. Review your medicines with your doctor. Some medicines can make you feel dizzy. This can increase your chance of falling. Ask your doctor what other things that you can do to help prevent falls. This information is not intended to replace advice given to you by your health care provider. Make sure you discuss any questions you have with your health care provider. Document Released: 11/12/2008 Document Revised: 06/24/2015 Document Reviewed: 02/20/2014 Elsevier Interactive Patient Education  2017 [employer].

## 2020-12-01 NOTE — Assessment & Plan Note (Signed)
Chronic Blood pressure well controlled CMP Continue losartan 25 mg daily 

## 2020-12-01 NOTE — Assessment & Plan Note (Signed)
Chronic Controlled Denies gout attacks since his last visit Continue allopurinol 300 mg a day

## 2020-12-01 NOTE — Assessment & Plan Note (Signed)
Chronic Paroxysmal Following with cardiology On Eliquis 5 mg twice daily Asymptomatic, rate controlled CBC, TSH, CMP

## 2020-12-01 NOTE — Assessment & Plan Note (Signed)
Chronic Lab Results  Component Value Date   HGBA1C 6.8 (H) 04/26/2020   Sugars have been controlled Check A1c, urine microalbumin today Continue Jardiance 25 mg daily, metformin 1000 mg twice daily Stressed diabetic diet, regular exercise

## 2020-12-02 ENCOUNTER — Other Ambulatory Visit: Payer: Self-pay | Admitting: Cardiology

## 2020-12-02 ENCOUNTER — Other Ambulatory Visit: Payer: Self-pay | Admitting: Internal Medicine

## 2020-12-08 ENCOUNTER — Other Ambulatory Visit: Payer: Self-pay | Admitting: Cardiology

## 2020-12-08 NOTE — Telephone Encounter (Signed)
Prescription refill request for Eliquis received. Indication:Afib Last office visit:1/22 Scr:0.7 Age: 80 Weight:85.8 kg  Prescription refilled

## 2020-12-24 ENCOUNTER — Other Ambulatory Visit: Payer: Self-pay | Admitting: Internal Medicine

## 2021-01-05 DIAGNOSIS — E119 Type 2 diabetes mellitus without complications: Secondary | ICD-10-CM | POA: Diagnosis not present

## 2021-01-05 DIAGNOSIS — H25812 Combined forms of age-related cataract, left eye: Secondary | ICD-10-CM | POA: Diagnosis not present

## 2021-01-05 DIAGNOSIS — H0102A Squamous blepharitis right eye, upper and lower eyelids: Secondary | ICD-10-CM | POA: Diagnosis not present

## 2021-01-05 DIAGNOSIS — H04123 Dry eye syndrome of bilateral lacrimal glands: Secondary | ICD-10-CM | POA: Diagnosis not present

## 2021-01-05 DIAGNOSIS — H0102B Squamous blepharitis left eye, upper and lower eyelids: Secondary | ICD-10-CM | POA: Diagnosis not present

## 2021-01-05 DIAGNOSIS — H40013 Open angle with borderline findings, low risk, bilateral: Secondary | ICD-10-CM | POA: Diagnosis not present

## 2021-01-12 DIAGNOSIS — H04123 Dry eye syndrome of bilateral lacrimal glands: Secondary | ICD-10-CM | POA: Diagnosis not present

## 2021-01-12 DIAGNOSIS — H25813 Combined forms of age-related cataract, bilateral: Secondary | ICD-10-CM | POA: Diagnosis not present

## 2021-01-25 DIAGNOSIS — H25812 Combined forms of age-related cataract, left eye: Secondary | ICD-10-CM | POA: Diagnosis not present

## 2021-01-30 HISTORY — PX: CATARACT EXTRACTION: SUR2

## 2021-02-08 NOTE — Progress Notes (Signed)
[facility]. [address], [place]  [address] Phone: [phone] Fax:  [phone]  Date:  02/22/2021   ID:  Bryce Maldonado, DOB 1940/04/30, MRN [id number]  PCP:  Binnie Rail, MD  Cardiologist:  Dr. Nyaira Hodgens [place]     History of Present Illness: Bryce Maldonado is a 81 y.o. male is seen for follow up CAD and atrial flutter.   He has a hx of CAD, s/p CABG in 1995, HL, prostate CA, DJD, spinal stenosis. Prior to left total hip replacement in 03/2010 he underwent a Lexiscan Myoview. This demonstrated an EF of 66% and inferior ischemia. LHC 01/2010: Proximal LAD 90%, proximal OM totally occluded, RCA occluded, SVG-PDA patent, SVG-diagonal patent, SVG-OM patent, LIMA-LAD patent. There were left to right collaterals. EF 60-65%. He was treated medically.   In 2016 he had new onset atrial flutter. He was anticoagulated and underwent Ablation by Dr. Rayann Heman on October 01, 2014. He had repeat ablation on Nov. 4 2016.  On his visit 2020 he was noted to be in atrial flutter with controlled rate. He was asymptomatic. Started on Eliquis.   On follow up today he is doing well. No chest pain, palpitations, dyspnea, dizziness. No bleeding. Stays active. Reports BP is normal at home.    Wt Readings from Last 3 Encounters:  02/22/21 192 lb 6.4 oz (87.3 kg)  12/01/20 189 lb 3.2 oz (85.8 kg)  12/01/20 189 lb 3.2 oz (85.8 kg)     Past Medical History:  Diagnosis Date   Anemia    BPH (benign prostatic hyperplasia)    Elevated PSA   Childhood asthma    Coronary artery disease    post CABG x4 -- in 1995 -- due to three-vessel coronary artery disease   Diverticular disease    Dyslipidemia    First degree AV block    Gout    Osteoarthritis    "everywhere" (12/04/2014)   Prostate cancer [facility]) 2012   Dr Karsten Ro   Spinal stenosis 2004   L4 radiculopathy, herniated nucleus pulposus L4-5   Tinnitus    Type II diabetes mellitus (HCC)    Typical atrial flutter (HCC)    s/p ablation     Current Outpatient Medications  Medication Sig Dispense Refill   allopurinol (ZYLOPRIM) 300 MG tablet TAKE 1 TABLET(300 MG) BY MOUTH DAILY 90 tablet 1   Clobetasol Propionate 0.05 % shampoo apply shmp to dry scalp qd for up to 4wk; Info: leave shmp lather on scalp x25min, rinse off 118 mL 0   ezetimibe (ZETIA) 10 MG tablet TAKE 1 TABLET(10 MG) BY MOUTH AT BEDTIME 90 tablet 3   JARDIANCE 25 MG TABS tablet TAKE 1 TABLET(25 MG) BY MOUTH DAILY 90 tablet 2   losartan (COZAAR) 25 MG tablet TAKE 1 TABLET(25 MG) BY MOUTH DAILY 90 tablet 1   Melatonin 5 MG TABS Take 5 mg by mouth at bedtime.     metFORMIN (GLUCOPHAGE) 500 MG tablet TAKE 2 TABLETS(1000 MG) BY MOUTH TWICE DAILY WITH A MEAL 360 tablet 2   Multiple Vitamins-Minerals (CENTRUM PO) Take 1 tablet by mouth daily.     venlafaxine XR (EFFEXOR-XR) 37.5 MG 24 hr capsule TAKE 1 CAPSULE(37.5 MG) BY MOUTH DAILY WITH BREAKFAST 90 capsule 3   apixaban (ELIQUIS) 5 MG TABS tablet Take 1 tablet (5 mg total) by mouth 2 (two) times daily. 180 tablet 3   rosuvastatin (CRESTOR) 10 MG tablet Take 1 tablet (10 mg total) by mouth at bedtime.  90 tablet 0   No current facility-administered medications for this visit.    Allergies:    Allergies  Allergen Reactions   Naproxen Sodium     rash    Social History:  The patient  reports that he quit smoking about 51 years ago. His smoking use included cigarettes. He has a 37.50 pack-year smoking history. He has never used smokeless tobacco. He reports current alcohol use of about 4.0 standard drinks per week. He reports that he does not use drugs.   ROS:  Please see the history of present illness.      All other systems reviewed and negative.   PHYSICAL EXAM: VS:  BP (!) 162/78    Pulse 90    Ht 5\' 11"  (1.803 m)    Wt 192 lb 6.4 oz (87.3 kg)    SpO2 96%    BMI 26.83 kg/m  GENERAL:  Well appearing WM in NAD HEENT:  PERRL, EOMI, sclera are clear. Oropharynx is clear. NECK:  No jugular venous distention,  carotid upstroke brisk and symmetric, no bruits, no thyromegaly or adenopathy LUNGS:  Clear to auscultation bilaterally CHEST:  Unremarkable HEART:  IRRR,  PMI not displaced or sustained,S1 and S2 within normal limits, no S3, no S4: no clicks, no rubs, gr 2/6 systolic murmur RUSB to apex. ABD:  Soft, nontender. BS +, no masses or bruits. No hepatomegaly, no splenomegaly EXT:  2 + pulses throughout, no edema, no cyanosis no clubbing SKIN:  Warm and dry.  No rashes NEURO:  Alert and oriented x 3. Cranial nerves II through XII intact. PSYCH:  Cognitively intact    Laboratory data:  Lab Results  Component Value Date   WBC 5.6 12/01/2020   HGB 15.3 12/01/2020   HCT 45.6 12/01/2020   PLT 245.0 12/01/2020   GLUCOSE 125 (H) 12/01/2020   CHOL 131 12/01/2020   TRIG 96.0 12/01/2020   HDL 56.10 12/01/2020   LDLDIRECT 86.2 06/24/2010   LDLCALC 56 12/01/2020   ALT 16 12/01/2020   AST 15 12/01/2020   NA 139 12/01/2020   K 4.5 12/01/2020   CL 100 12/01/2020   CREATININE 0.78 12/01/2020   BUN 19 12/01/2020   CO2 30 12/01/2020   TSH 1.08 12/01/2020   PSA 6.12 (H) 08/06/2006   INR 0.97 03/04/2010   HGBA1C 6.9 (H) 12/01/2020   MICROALBUR <0.7 12/01/2020    Ecg today shows Afib with rate 90 bpm. Nonspecific ST T abnormality. I have personally reviewed and interpreted this study.  Echo 12/31/18:IMPRESSIONS     1. Left ventricular ejection fraction, by visual estimation, is 70 to  75%. The left ventricle has hyperdynamic function. There is no left  ventricular hypertrophy.   2. Left ventricular diastolic function could not be evaluated.   3. Global right ventricle has normal systolic function.The right  ventricular size is normal. No increase in right ventricular wall  thickness.   4. Left atrial size was mildly dilated.   5. Right atrial size was normal.   6. Moderate to severe aortic valve annular calcification.   7. Moderate to severe mitral annular calcification.   8. The mitral  valve is grossly normal. Mild mitral valve regurgitation.   9. The tricuspid valve is normal in structure. Tricuspid valve  regurgitation is mild.  10. The aortic valve has an indeterminant number of cusps. Aortic valve  regurgitation is mild.  11. The pulmonic valve was normal in structure. Pulmonic valve  regurgitation is trivial.  12. Aortic  dilatation noted.  13. There is mild dilatation of the ascending aorta.  14. Normal pulmonary artery systolic pressure.  15. The atrial septum is grossly normal.    ASSESSMENT AND PLAN:  CAD: s/p CABG 1995.  He is active and asymptomatic.  He had a LHC in 2012 that demonstrated patent bypass grafts.  Continue medical Rx.  Atrial flutter- s/p  Ablation x 2 in 2016. Recurrent Afib. Rate is controlled and he is completely asymptomatic. [place] vasc score of 4. Recommend long term anticoagulation. Will continue Eliquis 5 mg bid.   3.  Hyperlipidemia:  At goal on statin.  4.   DM per primary care. A1c 6.9%  5.   HTN. Well controlled per patient report  6.   AV sclerosis  I will follow up in one year.  Signed, Ednamae Schiano [place] MD, [facility]

## 2021-02-22 ENCOUNTER — Encounter: Payer: Self-pay | Admitting: Cardiology

## 2021-02-22 ENCOUNTER — Ambulatory Visit: Payer: Medicare PPO | Admitting: Cardiology

## 2021-02-22 ENCOUNTER — Other Ambulatory Visit: Payer: Self-pay

## 2021-02-22 VITALS — BP 162/78 | HR 90 | Ht 71.0 in | Wt 192.4 lb

## 2021-02-22 DIAGNOSIS — I25708 Atherosclerosis of coronary artery bypass graft(s), unspecified, with other forms of angina pectoris: Secondary | ICD-10-CM

## 2021-02-22 DIAGNOSIS — E782 Mixed hyperlipidemia: Secondary | ICD-10-CM

## 2021-02-22 DIAGNOSIS — I4819 Other persistent atrial fibrillation: Secondary | ICD-10-CM

## 2021-02-22 DIAGNOSIS — I10 Essential (primary) hypertension: Secondary | ICD-10-CM | POA: Diagnosis not present

## 2021-02-22 MED ORDER — APIXABAN 5 MG PO TABS: 5.0000 mg | ORAL_TABLET | Freq: Two times a day (BID) | ORAL | 3 refills | Status: DC

## 2021-02-22 MED ORDER — ROSUVASTATIN CALCIUM 10 MG PO TABS
10.0000 mg | ORAL_TABLET | Freq: Every day | ORAL | 0 refills | Status: DC
Start: 2021-02-22 — End: 2021-05-31

## 2021-03-02 ENCOUNTER — Other Ambulatory Visit: Payer: Self-pay | Admitting: *Deleted

## 2021-03-02 MED ORDER — EZETIMIBE 10 MG PO TABS: ORAL_TABLET | ORAL | 3 refills | Status: DC

## 2021-03-30 DIAGNOSIS — H2511 Age-related nuclear cataract, right eye: Secondary | ICD-10-CM | POA: Diagnosis not present

## 2021-04-01 DIAGNOSIS — H25811 Combined forms of age-related cataract, right eye: Secondary | ICD-10-CM | POA: Diagnosis not present

## 2021-05-31 ENCOUNTER — Other Ambulatory Visit: Payer: Self-pay | Admitting: Cardiology

## 2021-05-31 ENCOUNTER — Other Ambulatory Visit: Payer: Self-pay | Admitting: Internal Medicine

## 2021-05-31 DIAGNOSIS — Z6826 Body mass index (BMI) 26.0-26.9, adult: Secondary | ICD-10-CM | POA: Diagnosis not present

## 2021-05-31 DIAGNOSIS — M5442 Lumbago with sciatica, left side: Secondary | ICD-10-CM | POA: Diagnosis not present

## 2021-05-31 NOTE — Progress Notes (Signed)
? ? ? ? ?Subjective:  ? ? Patient ID: Bryce Maldonado, male    DOB: 1940/06/07, 81 y.o.   MRN: [id number] ? ?This visit occurred during the SARS-CoV-2 public health emergency.  Safety protocols were in place, including screening questions prior to the visit, additional usage of staff PPE, and extensive cleaning of exam room while observing appropriate contact time as indicated for disinfecting solutions.   ? ? ?HPI ?Bryce Maldonado is here for follow up of his chronic medical problems, including DM, htn, hld, h/o gout, anxiety ? ?He has fallen at least twice this year.  He has difficulty getting back up.  He is unsteady on his feet. His gait is worse when he first starts walking due to his back - his gait improves with walking longer.  He has an MRI for his back soon - saw Dr Arnoldo Morale yesterday.  He just did PT for 6 weeks for his back.  He does feel that his muscles are weak.  Both of his falls occurred when he was leaning forward and his momentum took him forward and he fell. ? ? ? ?Medications and allergies reviewed with patient and updated if appropriate. ? ?Current Outpatient Medications on File Prior to Visit  ?Medication Sig Dispense Refill  ? allopurinol (ZYLOPRIM) 300 MG tablet TAKE 1 TABLET(300 MG) BY MOUTH DAILY 90 tablet 1  ? apixaban (ELIQUIS) 5 MG TABS tablet Take 1 tablet (5 mg total) by mouth 2 (two) times daily. 180 tablet 3  ? ezetimibe (ZETIA) 10 MG tablet TAKE 1 TABLET(10 MG) BY MOUTH AT BEDTIME 90 tablet 3  ? JARDIANCE 25 MG TABS tablet TAKE 1 TABLET(25 MG) BY MOUTH DAILY 90 tablet 2  ? losartan (COZAAR) 25 MG tablet TAKE 1 TABLET(25 MG) BY MOUTH DAILY 90 tablet 1  ? Melatonin 5 MG TABS Take 5 mg by mouth at bedtime.    ? metFORMIN (GLUCOPHAGE) 500 MG tablet TAKE 2 TABLETS(1000 MG) BY MOUTH TWICE DAILY WITH A MEAL 360 tablet 2  ? Multiple Vitamins-Minerals (CENTRUM PO) Take 1 tablet by mouth daily.    ? rosuvastatin (CRESTOR) 10 MG tablet TAKE 1 TABLET(10 MG) BY MOUTH AT BEDTIME 90 tablet 3  ? venlafaxine  XR (EFFEXOR-XR) 37.5 MG 24 hr capsule TAKE 1 CAPSULE(37.5 MG) BY MOUTH DAILY WITH BREAKFAST 90 capsule 3  ? ?No current facility-administered medications on file prior to visit.  ? ? ? ?Review of Systems  ?Constitutional:  Negative for fever.  ?Respiratory:  Negative for cough, shortness of breath and wheezing.   ?Cardiovascular:  Negative for chest pain, palpitations and leg swelling.  ?Musculoskeletal:  Positive for back pain.  ?Neurological:  Positive for numbness (in legs from back) and headaches (sinus HA's). Negative for light-headedness.  ? ?   ?Objective:  ? ?Vitals:  ? 06/01/21 0918  ?BP: 134/82  ?Pulse: 96  ?Temp: 98.4 ?F (36.9 ?C)  ?SpO2: 99%  ? ?BP Readings from Last 3 Encounters:  ?06/01/21 134/82  ?02/22/21 (!) 162/78  ?12/01/20 122/70  ? ?Wt Readings from Last 3 Encounters:  ?06/01/21 191 lb (86.6 kg)  ?02/22/21 192 lb 6.4 oz (87.3 kg)  ?12/01/20 189 lb 3.2 oz (85.8 kg)  ? ?Body mass index is 26.64 kg/m?. ? ?  ?Physical Exam ?Constitutional:   ?   General: He is not in acute distress. ?   Appearance: Normal appearance. He is not ill-appearing.  ?HENT:  ?   Head: Normocephalic and atraumatic.  ?Eyes:  ?   Conjunctiva/sclera: Conjunctivae  normal.  ?Cardiovascular:  ?   Rate and Rhythm: Normal rate and regular rhythm.  ?   Heart sounds: Normal heart sounds. No murmur heard. ?Pulmonary:  ?   Effort: Pulmonary effort is normal. No respiratory distress.  ?   Breath sounds: Normal breath sounds. No wheezing or rales.  ?Musculoskeletal:  ?   Right lower leg: No edema.  ?   Left lower leg: No edema.  ?Skin: ?   General: Skin is warm and dry.  ?   Findings: No rash.  ?Neurological:  ?   Mental Status: He is alert. Mental status is at baseline.  ?Psychiatric:     ?   Mood and Affect: Mood normal.  ? ?   ? ?Lab Results  ?Component Value Date  ? WBC 5.6 12/01/2020  ? HGB 15.3 12/01/2020  ? HCT 45.6 12/01/2020  ? PLT 245.0 12/01/2020  ? GLUCOSE 125 (H) 12/01/2020  ? CHOL 131 12/01/2020  ? TRIG 96.0 12/01/2020  ?  HDL 56.10 12/01/2020  ? LDLDIRECT 86.2 06/24/2010  ? [place] 56 12/01/2020  ? ALT 16 12/01/2020  ? AST 15 12/01/2020  ? NA 139 12/01/2020  ? K 4.5 12/01/2020  ? CL 100 12/01/2020  ? CREATININE 0.78 12/01/2020  ? BUN 19 12/01/2020  ? CO2 30 12/01/2020  ? TSH 1.08 12/01/2020  ? PSA 6.12 (H) 08/06/2006  ? INR 0.97 03/04/2010  ? HGBA1C 6.9 (H) 12/01/2020  ? MICROALBUR <0.7 12/01/2020  ? ? ? ?Assessment & Plan:  ? ? ?See Problem List for Assessment and Plan of chronic medical problems.  ? ? ?

## 2021-05-31 NOTE — Patient Instructions (Addendum)
? ? ? ?  Blood work was ordered.   ? ? ?Medications changes include :   none ? ? ? ?Return in about 6 months (around 12/02/2021) for follow up. ? ?

## 2021-06-01 ENCOUNTER — Encounter: Payer: Self-pay | Admitting: Internal Medicine

## 2021-06-01 ENCOUNTER — Ambulatory Visit: Payer: Medicare PPO | Admitting: Internal Medicine

## 2021-06-01 VITALS — BP 134/82 | HR 96 | Temp 98.4°F | Ht 71.0 in | Wt 191.0 lb

## 2021-06-01 DIAGNOSIS — F419 Anxiety disorder, unspecified: Secondary | ICD-10-CM | POA: Diagnosis not present

## 2021-06-01 DIAGNOSIS — M5440 Lumbago with sciatica, unspecified side: Secondary | ICD-10-CM

## 2021-06-01 DIAGNOSIS — I10 Essential (primary) hypertension: Secondary | ICD-10-CM

## 2021-06-01 DIAGNOSIS — G8929 Other chronic pain: Secondary | ICD-10-CM | POA: Diagnosis not present

## 2021-06-01 DIAGNOSIS — R5381 Other malaise: Secondary | ICD-10-CM | POA: Insufficient documentation

## 2021-06-01 DIAGNOSIS — E782 Mixed hyperlipidemia: Secondary | ICD-10-CM

## 2021-06-01 DIAGNOSIS — E1159 Type 2 diabetes mellitus with other circulatory complications: Secondary | ICD-10-CM | POA: Diagnosis not present

## 2021-06-01 DIAGNOSIS — M1A9XX0 Chronic gout, unspecified, without tophus (tophi): Secondary | ICD-10-CM | POA: Diagnosis not present

## 2021-06-01 DIAGNOSIS — I480 Paroxysmal atrial fibrillation: Secondary | ICD-10-CM

## 2021-06-01 LAB — LIPID PANEL
Cholesterol: 120 mg/dL (ref 0–200)
HDL: 51.5 mg/dL (ref >39.00)
LDL Cholesterol: 52 mg/dL (ref 0–99)
NonHDL: 68.47
Total CHOL/HDL Ratio: 2
Triglycerides: 82 mg/dL (ref 0.0–149.0)
VLDL: 16.4 mg/dL (ref 0.0–40.0)

## 2021-06-01 LAB — COMPREHENSIVE METABOLIC PANEL
ALT: 15 U/L (ref 0–53)
AST: 14 U/L (ref 0–37)
Albumin: 4.6 g/dL (ref 3.5–5.2)
Alkaline Phosphatase: 47 U/L (ref 39–117)
BUN: 23 mg/dL (ref 6–23)
CO2: 28 mEq/L (ref 19–32)
Calcium: 9.7 mg/dL (ref 8.4–10.5)
Chloride: 101 mEq/L (ref 96–112)
Creatinine, Ser: 0.74 mg/dL (ref 0.40–1.50)
GFR: 86.1 mL/min (ref >60.00)
Glucose, Bld: 138 mg/dL — ABNORMAL HIGH (ref 70–99)
Potassium: 4.1 mEq/L (ref 3.5–5.1)
Sodium: 137 mEq/L (ref 135–145)
Total Bilirubin: 0.7 mg/dL (ref 0.2–1.2)
Total Protein: 7.4 g/dL (ref 6.0–8.3)

## 2021-06-01 LAB — CBC WITH DIFFERENTIAL/PLATELET
Basophils Absolute: 0.1 10*3/uL (ref 0.0–0.1)
Basophils Relative: 1.5 % (ref 0.0–3.0)
Eosinophils Absolute: 0.3 10*3/uL (ref 0.0–0.7)
Eosinophils Relative: 5.5 % — ABNORMAL HIGH (ref 0.0–5.0)
HCT: 43.8 % (ref 39.0–52.0)
Hemoglobin: 14.7 g/dL (ref 13.0–17.0)
Lymphocytes Relative: 18.7 % (ref 12.0–46.0)
Lymphs Abs: 1.1 10*3/uL (ref 0.7–4.0)
MCHC: 33.6 g/dL (ref 30.0–36.0)
MCV: 93.8 fl (ref 78.0–100.0)
Monocytes Absolute: 0.8 10*3/uL (ref 0.1–1.0)
Monocytes Relative: 12.6 % — ABNORMAL HIGH (ref 3.0–12.0)
Neutro Abs: 3.7 10*3/uL (ref 1.4–7.7)
Neutrophils Relative %: 61.7 % (ref 43.0–77.0)
Platelets: 244 10*3/uL (ref 150.0–400.0)
RBC: 4.67 Mil/uL (ref 4.22–5.81)
RDW: 13.8 % (ref 11.5–15.5)
WBC: 6 10*3/uL (ref 4.0–10.5)

## 2021-06-01 LAB — HEMOGLOBIN A1C: Hgb A1c MFr Bld: 6.8 % — ABNORMAL HIGH (ref 4.6–6.5)

## 2021-06-01 LAB — URIC ACID: Uric Acid, Serum: 3.7 mg/dL — ABNORMAL LOW (ref 4.0–7.8)

## 2021-06-01 NOTE — Assessment & Plan Note (Signed)
Chronic Controlled, Stable Continue Effexor 37.5 mg daily 

## 2021-06-01 NOTE — Assessment & Plan Note (Signed)
Chronic ?Lab Results  ?Component Value Date  ? HGBA1C 6.9 (H) 12/01/2020  ? ?Sugars controlled ?Check A1c, urine microalbumin today ?Continue Jardiance 25 mg daily, metformin 1000 mg twice daily ?Stressed regular exercise, diabetic diet ?Will adjust medication as needed ? ?

## 2021-06-01 NOTE — Assessment & Plan Note (Signed)
Chronic Blood pressure well controlled CMP Continue losartan 25 mg daily 

## 2021-06-01 NOTE — Assessment & Plan Note (Signed)
Chronic Regular exercise and healthy diet encouraged Check lipid panel  Continue Zetia 10 mg daily, Crestor 10 mg daily 

## 2021-06-01 NOTE — Assessment & Plan Note (Signed)
Chronic ?Following with Dr. Arnoldo Morale who did surgery on him previously ?Has chronic lower back pain and intermittent sciatica which can be severe ?MRI has been ordered ?Completed 6 weeks of PT without much improvement in pain ?This is disturbing his balance, strength and mobility ?

## 2021-06-01 NOTE — Assessment & Plan Note (Signed)
New ?He does have some physical deconditioning and would benefit from more regular exercise and strength exercises ?He just did physical therapy, but discussed doing physical therapy again to focus on other things may help ?He could also start doing some weights and strength training at home or consider going back to the gym, which is what he will try first ?

## 2021-06-01 NOTE — Assessment & Plan Note (Signed)
Chronic ?Paroxysmal ?Follows with cardiology ?On Eliquis 5 mg twice daily ?CBC, CMP ?

## 2021-06-01 NOTE — Assessment & Plan Note (Signed)
Chronic ?Controlled, Stable-no gout flare for years ?Continue allopurinol 300 mg daily ?Check uric acid level ?

## 2021-06-09 DIAGNOSIS — R202 Paresthesia of skin: Secondary | ICD-10-CM | POA: Diagnosis not present

## 2021-06-09 DIAGNOSIS — M5442 Lumbago with sciatica, left side: Secondary | ICD-10-CM | POA: Diagnosis not present

## 2021-06-09 DIAGNOSIS — R200 Anesthesia of skin: Secondary | ICD-10-CM | POA: Diagnosis not present

## 2021-06-09 DIAGNOSIS — M545 Low back pain, unspecified: Secondary | ICD-10-CM | POA: Diagnosis not present

## 2021-07-05 DIAGNOSIS — M48062 Spinal stenosis, lumbar region with neurogenic claudication: Secondary | ICD-10-CM | POA: Diagnosis not present

## 2021-07-05 DIAGNOSIS — Z6827 Body mass index (BMI) 27.0-27.9, adult: Secondary | ICD-10-CM | POA: Diagnosis not present

## 2021-07-08 ENCOUNTER — Telehealth: Payer: Self-pay | Admitting: *Deleted

## 2021-07-08 NOTE — Telephone Encounter (Signed)
   Pre-operative Risk Assessment    Patient Name: Bryce Maldonado  DOB: 01-07-1941 MRN: [id number]      Request for Surgical Clearance    Procedure:   LUMBAR FORAMINOTOMY   Date of Surgery:  Clearance TBD                                 Surgeon:  DR. Newman Pies Surgeon's Group or Practice Name:  [facility] Phone number:  [phone] Fax number:  [phone] ATTN: Lexine Baton   Type of Clearance Requested:   - Medical  - Pharmacy:  Hold Apixaban (Eliquis)     Type of Anesthesia:  General    Additional requests/questions:    Jiles Prows   07/08/2021, 3:25 PM

## 2021-07-11 NOTE — Telephone Encounter (Signed)
Patient with diagnosis of A Fib on Eliquis for anticoagulation.    Procedure: LUMBAR FORAMINOTOMY  Date of procedure: TBD   CHA2DS2-VASc Score = 5  This indicates a 7.2% annual risk of stroke. The patient's score is based upon: CHF History: 0 HTN History: 1 Diabetes History: 1 Stroke History: 0 Vascular Disease History: 1 Age Score: 2 Gender Score: 0    CrCl 99 mL/min Platelet count 244K  Per office protocol, patient can hold Eliquis for 3 days prior to procedure.

## 2021-07-12 ENCOUNTER — Ambulatory Visit (INDEPENDENT_AMBULATORY_CARE_PROVIDER_SITE_OTHER): Payer: Medicare PPO | Admitting: Nurse Practitioner

## 2021-07-12 ENCOUNTER — Telehealth: Payer: Self-pay | Admitting: *Deleted

## 2021-07-12 ENCOUNTER — Encounter: Payer: Self-pay | Admitting: Nurse Practitioner

## 2021-07-12 DIAGNOSIS — Z01810 Encounter for preprocedural cardiovascular examination: Secondary | ICD-10-CM | POA: Diagnosis not present

## 2021-07-12 NOTE — Progress Notes (Signed)
Virtual Visit via Telephone Note   Because of Bryce Maldonado's co-morbid illnesses, he is at least at moderate risk for complications without adequate follow up.  This format is felt to be most appropriate for this patient at this time.  The patient did not have access to video technology/had technical difficulties with video requiring transitioning to audio format only (telephone).  All issues noted in this document were discussed and addressed.  No physical exam could be performed with this format.  Please refer to the patient's chart for his consent to telehealth for [facility].  Evaluation Performed:  Preoperative cardiovascular risk assessment _____________   Date:  07/12/2021   Patient ID:  Bryce Maldonado, DOB Apr 03, 1940, MRN [id number] Patient Location:  Home Provider location:   Office  Primary Care Provider:  Binnie Rail, MD Primary Cardiologist:  Peter Martinique, MD  Chief Complaint / Patient Profile   81 y.o. y/o male with a h/o CAD s/p CABG 1995, cardiac catheterization 2012 revealed patent bypass grafts, atrial flutter s/p ablation 2016, type 2 diabetes, hypertension, hyperlipidemia, and AV sclerosis who is pending lumbar foramintomy and presents today for telephonic preoperative cardiovascular risk assessment.  Past Medical History    Past Medical History:  Diagnosis Date   Anemia    BPH (benign prostatic hyperplasia)    Elevated PSA   Childhood asthma    Coronary artery disease    post CABG x4 -- in 1995 -- due to three-vessel coronary artery disease   Diverticular disease    Dyslipidemia    First degree AV block    Gout    Osteoarthritis    "everywhere" (12/04/2014)   Prostate cancer [facility]) 2012   Dr Karsten Ro   Spinal stenosis 2004   L4 radiculopathy, herniated nucleus pulposus L4-5   Tinnitus    Type II diabetes mellitus (HCC)    Typical atrial flutter (HCC)    s/p ablation   Past Surgical History:  Procedure Laterality Date   APPENDECTOMY  1943    ATRIAL FLUTTER ABLATION  12/04/2014   BACK SURGERY     CARDIAC CATHETERIZATION  02/10/2010   Est. EF of 60-65% -- Severe three-vessel obstructive atherosclerotic coronary artery disease -- All grafts were patent including left internal mammary artery graft to left anterior descending coronary artery, saphenous vein graft to the diagonal, saphenous vein graft to obtuse marginal vessel, and saphenous vein graft to the posterior descending coronary artery -- Normal left ventricular function    COLONOSCOPY     negative X3; Dr Carlean Purl   CORONARY ARTERY BYPASS GRAFT  10/21/1993   x4 -- using the LIM artery graft to the LAD artery, with saphenous vein grafts to the diagonal branch of the LAD, OM branch the left circumflex coronary artery, and the posterior descending branch of the RCA -- Est. EF of 65%-- Surgeon: Gaye Pollack, M.D.               ELECTROPHYSIOLOGIC STUDY N/A 10/01/2014   CTI ablation by Dr Curt Bears   ELECTROPHYSIOLOGIC STUDY N/A 12/04/2014   repeat CTI ablation by Dr Rayann Heman   FLEXIBLE SIGMOIDOSCOPY  2005    external hemorrhoids   JOINT REPLACEMENT     MICRODISCECTOMY LUMBAR  2004   L4-5, Dr. Arnoldo Morale   PROSTATE BIOPSY  10/2008   "had radiation tx for prostate cancer"   SALIVARY STONE REMOVAL  2001   TOTAL HIP ARTHROPLASTY Right November 2010   TOTAL HIP ARTHROPLASTY Left 03/2010   Dr Alvan Dame  Allergies  Allergies  Allergen Reactions   Naproxen Sodium     rash    History of Present Illness    Bryce Maldonado is a 81 y.o. male who presents via audio/video conferencing for a telehealth visit today.  Pt was last seen in cardiology clinic on 02/22/2021 by Dr. Martinique. At that time Bryce Maldonado was doing well.  The patient is now pending procedure as outlined above. Since his last visit, he denies chest pain, shortness of breath, lower extremity edema, fatigue, palpitations, melena, hematuria, hemoptysis, diaphoresis, weakness, presyncope, syncope, orthopnea, and PND.  Home  Medications    Prior to Admission medications   Medication Sig Start Date End Date Taking? Authorizing Provider  allopurinol (ZYLOPRIM) 300 MG tablet TAKE 1 TABLET(300 MG) BY MOUTH DAILY 05/31/21   Burns, Claudina Lick, MD  apixaban (ELIQUIS) 5 MG TABS tablet Take 1 tablet (5 mg total) by mouth 2 (two) times daily. 02/22/21   Martinique, Peter M, MD  ezetimibe (ZETIA) 10 MG tablet TAKE 1 TABLET(10 MG) BY MOUTH AT BEDTIME 03/02/21   Martinique, Peter M, MD  JARDIANCE 25 MG TABS tablet TAKE 1 TABLET(25 MG) BY MOUTH DAILY 12/27/20   Binnie Rail, MD  losartan (COZAAR) 25 MG tablet TAKE 1 TABLET(25 MG) BY MOUTH DAILY 05/31/21   Binnie Rail, MD  Melatonin 5 MG TABS Take 5 mg by mouth at bedtime.    [provider]  metFORMIN (GLUCOPHAGE) 500 MG tablet TAKE 2 TABLETS(1000 MG) BY MOUTH TWICE DAILY WITH A MEAL 12/27/20   Burns, Claudina Lick, MD  Multiple Vitamins-Minerals (CENTRUM PO) Take 1 tablet by mouth daily.    [provider]  rosuvastatin (CRESTOR) 10 MG tablet TAKE 1 TABLET(10 MG) BY MOUTH AT BEDTIME 05/31/21   Martinique, Peter M, MD  venlafaxine XR (EFFEXOR-XR) 37.5 MG 24 hr capsule TAKE 1 CAPSULE(37.5 MG) BY MOUTH DAILY WITH BREAKFAST 12/01/20   Binnie Rail, MD    Physical Exam    Vital Signs:  Bryce Maldonado does not have vital signs available for review today.  Given telephonic nature of communication, physical exam is limited. AAOx3. NAD. Normal affect.  Speech and respirations are unlabored.  Accessory Clinical Findings    None  Assessment & Plan    1.  Preoperative Cardiovascular Risk Assessment: He is doing well from a cardiac perspective and may proceed to surgery without further testing. According to the Revised Cardiac Risk Index (RCRI), his Perioperative Risk of Major Cardiac Event is (%): 0.9. His Functional Capacity in METs is: 5.62 according to the Duke Activity Status Index (DASI).   CHA2DS2-VASc Score = 5  This indicates a 7.2% annual risk of stroke. The patient's  score is based upon: CHF History: 0 HTN History: 1 Diabetes History: 1 Stroke History: 0 Vascular Disease History: 1 Age Score: 2 Gender Score: 0   CrCl 99 mL/min Platelet count 244K   Per office protocol, patient can hold Eliquis for 3 days prior to procedure.    A copy of this note will be routed to requesting surgeon.  Time:   Today, I have spent 10 minutes with the patient with telehealth technology discussing medical history, symptoms, and management plan.     Emmaline Life, NP-C    07/12/2021, 2:20 PM [place] [address], [address] Office [phone] Fax [phone]

## 2021-07-12 NOTE — Telephone Encounter (Signed)
Primary Cardiologist:Peter Martinique, MD  Chart reviewed as part of pre-operative protocol coverage. Because of Emanual Lamountain Southgate's past medical history and time since last visit, he/she will require a virtual visit/telephone call in order to better assess preoperative cardiovascular risk.  Pre-op covering staff: - Please contact patient, obtain consent, and schedule appointment   Request regarding holding anticoagulant has been addressed by Pharm D.  Bryce Life, NP-C    07/12/2021, 8:27 AM [place] [address], [address] Office [phone] Fax [phone]

## 2021-07-12 NOTE — Telephone Encounter (Signed)
Spoke with patient and scheduled him for a telehealth appointment for today at 2:20 PM with Christen Bame, NP. Consent in and meds reviewed.

## 2021-07-12 NOTE — Telephone Encounter (Signed)
  Patient Consent for Virtual Visit        Bryce Maldonado has provided verbal consent on 07/12/2021 for a virtual visit (video or telephone).   CONSENT FOR VIRTUAL VISIT FOR:  Bryce Maldonado  By participating in this virtual visit I agree to the following:  I hereby voluntarily request, consent and authorize [facility] and its employed or contracted physicians, physician assistants, nurse practitioners or other licensed health care professionals (the Practitioner), to provide me with telemedicine health care services (the "Services") as deemed necessary by the treating Practitioner. I acknowledge and consent to receive the Services by the Practitioner via telemedicine. I understand that the telemedicine visit will involve communicating with the Practitioner through live audiovisual communication technology and the disclosure of certain medical information by electronic transmission. I acknowledge that I have been given the opportunity to request an in-person assessment or other available alternative prior to the telemedicine visit and am voluntarily participating in the telemedicine visit.  I understand that I have the right to withhold or withdraw my consent to the use of telemedicine in the course of my care at any time, without affecting my right to future care or treatment, and that the Practitioner or I may terminate the telemedicine visit at any time. I understand that I have the right to inspect all information obtained and/or recorded in the course of the telemedicine visit and may receive copies of available information for a reasonable fee.  I understand that some of the potential risks of receiving the Services via telemedicine include:  Delay or interruption in medical evaluation due to technological equipment failure or disruption; Information transmitted may not be sufficient (e.g. poor resolution of images) to allow for appropriate medical decision making by the Practitioner;  and/or  In rare instances, security protocols could fail, causing a breach of personal health information.  Furthermore, I acknowledge that it is my responsibility to provide information about my medical history, conditions and care that is complete and accurate to the best of my ability. I acknowledge that Practitioner's advice, recommendations, and/or decision may be based on factors not within their control, such as incomplete or inaccurate data provided by me or distortions of diagnostic images or specimens that may result from electronic transmissions. I understand that the practice of medicine is not an exact science and that Practitioner makes no warranties or guarantees regarding treatment outcomes. I acknowledge that a copy of this consent can be made available to me via my patient portal ([facility]), or I can request a printed copy by calling the office of [facility].    I understand that my insurance will be billed for this visit.   I have read or had this consent read to me. I understand the contents of this consent, which adequately explains the benefits and risks of the Services being provided via telemedicine.  I have been provided ample opportunity to ask questions regarding this consent and the Services and have had my questions answered to my satisfaction. I give my informed consent for the services to be provided through the use of telemedicine in my medical care

## 2021-07-14 ENCOUNTER — Other Ambulatory Visit: Payer: Self-pay | Admitting: Neurosurgery

## 2021-07-22 ENCOUNTER — Other Ambulatory Visit: Payer: Self-pay | Admitting: Neurosurgery

## 2021-07-26 ENCOUNTER — Encounter (HOSPITAL_COMMUNITY): Payer: Self-pay

## 2021-07-26 ENCOUNTER — Other Ambulatory Visit: Payer: Self-pay

## 2021-07-26 ENCOUNTER — Encounter (HOSPITAL_COMMUNITY)
Admission: RE | Admit: 2021-07-26 | Discharge: 2021-07-26 | Disposition: A | Discharge status: Home / Self Care | Payer: Medicare PPO | Source: Ambulatory Visit | Attending: Neurosurgery | Admitting: Neurosurgery

## 2021-07-26 VITALS — BP 141/75 | HR 89 | Temp 97.5°F | Resp 17 | Ht 72.0 in | Wt 193.0 lb

## 2021-07-26 DIAGNOSIS — Z96643 Presence of artificial hip joint, bilateral: Secondary | ICD-10-CM | POA: Diagnosis not present

## 2021-07-26 DIAGNOSIS — M4316 Spondylolisthesis, lumbar region: Secondary | ICD-10-CM | POA: Diagnosis not present

## 2021-07-26 DIAGNOSIS — Z87891 Personal history of nicotine dependence: Secondary | ICD-10-CM | POA: Diagnosis not present

## 2021-07-26 DIAGNOSIS — M48062 Spinal stenosis, lumbar region with neurogenic claudication: Secondary | ICD-10-CM | POA: Insufficient documentation

## 2021-07-26 DIAGNOSIS — I10 Essential (primary) hypertension: Secondary | ICD-10-CM | POA: Diagnosis not present

## 2021-07-26 DIAGNOSIS — Z8546 Personal history of malignant neoplasm of prostate: Secondary | ICD-10-CM | POA: Diagnosis not present

## 2021-07-26 DIAGNOSIS — I2510 Atherosclerotic heart disease of native coronary artery without angina pectoris: Secondary | ICD-10-CM | POA: Insufficient documentation

## 2021-07-26 DIAGNOSIS — Z01818 Encounter for other preprocedural examination: Secondary | ICD-10-CM

## 2021-07-26 DIAGNOSIS — I4891 Unspecified atrial fibrillation: Secondary | ICD-10-CM | POA: Insufficient documentation

## 2021-07-26 DIAGNOSIS — N400 Enlarged prostate without lower urinary tract symptoms: Secondary | ICD-10-CM | POA: Diagnosis not present

## 2021-07-26 DIAGNOSIS — M1990 Unspecified osteoarthritis, unspecified site: Secondary | ICD-10-CM | POA: Insufficient documentation

## 2021-07-26 DIAGNOSIS — Z01812 Encounter for preprocedural laboratory examination: Secondary | ICD-10-CM | POA: Diagnosis not present

## 2021-07-26 DIAGNOSIS — I083 Combined rheumatic disorders of mitral, aortic and tricuspid valves: Secondary | ICD-10-CM | POA: Insufficient documentation

## 2021-07-26 DIAGNOSIS — E119 Type 2 diabetes mellitus without complications: Secondary | ICD-10-CM | POA: Diagnosis not present

## 2021-07-26 DIAGNOSIS — Z951 Presence of aortocoronary bypass graft: Secondary | ICD-10-CM | POA: Diagnosis not present

## 2021-07-26 DIAGNOSIS — E785 Hyperlipidemia, unspecified: Secondary | ICD-10-CM | POA: Insufficient documentation

## 2021-07-26 DIAGNOSIS — M5136 Other intervertebral disc degeneration, lumbar region: Secondary | ICD-10-CM | POA: Insufficient documentation

## 2021-07-26 DIAGNOSIS — I440 Atrioventricular block, first degree: Secondary | ICD-10-CM | POA: Insufficient documentation

## 2021-07-26 HISTORY — DX: Unspecified atrial fibrillation: I48.91

## 2021-07-26 HISTORY — DX: Essential (primary) hypertension: I10

## 2021-07-26 LAB — CBC
HCT: 45.4 % (ref 39.0–52.0)
Hemoglobin: 15.1 g/dL (ref 13.0–17.0)
MCH: 31.1 pg (ref 26.0–34.0)
MCHC: 33.3 g/dL (ref 30.0–36.0)
MCV: 93.4 fL (ref 80.0–100.0)
Platelets: 248 10*3/uL (ref 150–400)
RBC: 4.86 MIL/uL (ref 4.22–5.81)
RDW: 13 % (ref 11.5–15.5)
WBC: 6 10*3/uL (ref 4.0–10.5)
nRBC: 0 % (ref 0.0–0.2)

## 2021-07-26 LAB — BASIC METABOLIC PANEL
Anion gap: 13 (ref 5–15)
BUN: 22 mg/dL (ref 8–23)
CO2: 24 mmol/L (ref 22–32)
Calcium: 10 mg/dL (ref 8.9–10.3)
Chloride: 102 mmol/L (ref 98–111)
Creatinine, Ser: 0.7 mg/dL (ref 0.61–1.24)
GFR, Estimated: >60 mL/min (ref >60)
Glucose, Bld: 129 mg/dL — ABNORMAL HIGH (ref 70–99)
Potassium: 4 mmol/L (ref 3.5–5.1)
Sodium: 139 mmol/L (ref 135–145)

## 2021-07-26 LAB — GLUCOSE, CAPILLARY: Glucose-Capillary: 135 mg/dL — ABNORMAL HIGH (ref 70–99)

## 2021-07-26 LAB — SURGICAL PCR SCREEN
MRSA, PCR: NEGATIVE
Staphylococcus aureus: NEGATIVE

## 2021-07-26 NOTE — Progress Notes (Signed)
PCP - Dr. Cheryll Cockayne Cardiologist - Dr. Peter [place]  PPM/ICD - denies   Chest x-ray - 11/23/16 EKG - 02/22/21 Stress Test - 10+ years ago per pt, no abnormalities ECHO - 12/31/18 Cardiac Cath - 02/10/10  Sleep Study - denies  DM- Type 2 Pt does not check CBG at home  Blood Thinner Instructions: Hold Eliquis 3 days prior to surgery Aspirin Instructions: n/a  ERAS Protcol - no, NPO   COVID TEST- n/a   Anesthesia review: yes, cardiac hx  Patient denies shortness of breath, fever, cough and chest pain at PAT appointment   All instructions explained to the patient, with a verbal understanding of the material. Patient agrees to go over the instructions while at home for a better understanding. The opportunity to ask questions was provided.

## 2021-07-27 LAB — HEMOGLOBIN A1C
Hgb A1c MFr Bld: 7 % — ABNORMAL HIGH (ref 4.8–5.6)
Mean Plasma Glucose: 154 mg/dL

## 2021-07-27 NOTE — Progress Notes (Signed)
Anesthesia Chart Review:  Case: [id number] Date/Time: 08/03/21 0945   Procedure: L23, L34, L45 BILATERAL LAMINOTOMY/FORAMINOTOMY (Bilateral) - 3C   Anesthesia type: General   Pre-op diagnosis: LUMBAR STENOSIS WITH NEUROGENIC CLAUDICATION   Location: [facility] OR ROOM 20 / [place] OR   Surgeons: Newman Pies, MD       DISCUSSION: Patient is a 81 year old male scheduled for the above procedure.  History includes former smoker (quit 01/30/70), HTN, CAD (s/p CABG: LIMA-LAD, SVG-DIAG, SVG-OM, SVG-PDA 10/21/93; patent grafts 02/10/10 [facility]), afib/aflutter (s/p ablation 10/01/14 & 12/04/14; recurrent 12/2018), first degree AV block, DM2, dyslipidemia, prostate cancer, BPH, osteoarthritis (right THA 12/14/08; left THA 03/15/10), spinal surgery (right L4-5 microdiscectomy 11/26/02).   Preoperative cardiology input outlined by Christen Bame, NP on 07/12/21. "Preoperative Cardiovascular Risk Assessment: He is doing well from a cardiac perspective and may proceed to surgery without further testing. According to the Revised Cardiac Risk Index (RCRI), his Perioperative Risk of Major Cardiac Event is (%): 0.9. His Functional Capacity in METs is: 5.62 according to the Duke Activity Status Index (DASI).   CHA2DS2-VASc Score = 5.Marland KitchenMarland KitchenPer office protocol, patient can hold Eliquis for 3 days prior to procedure." He reported instructions to hold Eliquis for 3 days prior to surgery.  Anesthesia team to evaluate on the day of surgery.    VS: BP (!) 141/75   Pulse 89   Temp (!) 36.4 C   Resp 17   Ht 6' (1.829 m)   Wt 87.5 kg   SpO2 95%   BMI 26.18 kg/m    PROVIDERS: Binnie Rail, MD is PCP  Martinique, Peter, MD is cardiologist   LABS: Labs reviewed: Acceptable for surgery. (all labs ordered are listed, but only abnormal results are displayed)  Labs Reviewed  GLUCOSE, CAPILLARY - Abnormal; Notable for the following components:      Result Value   Glucose-Capillary 135 (*)    All other components within normal limits   HEMOGLOBIN A1C - Abnormal; Notable for the following components:   Hgb A1c MFr Bld 7.0 (*)    All other components within normal limits  BASIC METABOLIC PANEL - Abnormal; Notable for the following components:   Glucose, Bld 129 (*)    All other components within normal limits  SURGICAL PCR SCREEN  CBC    IMAGES: MRI L-spine 06/09/21 ([facility]): IMPRESSION: 1. Subtle spondylolisthesis at both L3-L4 and L4-L5. Disc bulging and moderate posterior element degeneration at those levels and also L2-L3. 2. Moderate subsequent multifactorial spinal, lateral recess stenosis, and foraminal stenosis at all 3 levels (up to severe left L2 and L4 nerve level foraminal stenosis). Suspected small left-side synovial cyst contributing to the stenosis at L3-L4.   EKG: 02/22/21 ([facility]): Afib at 90 bpm. ST and T wave abnormality, non-specific.    CV: Echo 12/31/18: IMPRESSIONS   1. Left ventricular ejection fraction, by visual estimation, is 70 to  75%. The left ventricle has hyperdynamic function. There is no left  ventricular hypertrophy.   2. Left ventricular diastolic function could not be evaluated.   3. Global right ventricle has normal systolic function.The right  ventricular size is normal. No increase in right ventricular wall  thickness.   4. Left atrial size was mildly dilated.   5. Right atrial size was normal.   6. Moderate to severe aortic valve annular calcification.   7. Moderate to severe mitral annular calcification.   8. The mitral valve is grossly normal. Mild mitral valve regurgitation.   9. The tricuspid valve is  normal in structure. Tricuspid valve  regurgitation is mild.  10. The aortic valve has an indeterminant number of cusps. Aortic valve  regurgitation is mild.  11. The pulmonic valve was normal in structure. Pulmonic valve  regurgitation is trivial.  12. Aortic dilatation noted. Ao Root diameter 3.20 cm. Ao Asc diameter 3.75 cm. 13. There is mild  dilatation of the ascending aorta.  14. Normal pulmonary artery systolic pressure.  15. The atrial septum is grossly normal.    Cardiac cath 02/10/10 (done following Lexiscan Myoview that showed EF 66%, inferior ischemia): - LM: Normal - LAD: 90% proximal stenosis. It fills by competitive flow from the IMA graft. DIAG1 appears normal. - LCX: Courses in the AV groove.  Gives rise to a single marginal branch which is occluded proximally. - RCA: Occluded at the ostium. - GRAFTS: SVG to PDA is widely patent.  SVG to diagonal is widely patent.  SVG to OM vessel is widely patent. LIMA to LAD widely patent.  There are left-to-right collaterals in the distal LAD to the right ventricular marginal branch of the mid to distal RCA. Final interpretation: 1.  Severe three-vessel obstructive atherosclerotic coronary artery disease. 2.  All grafts are patent including LIMA to LAD, SVG to diagonal, SVG to OM vessel, and SVG to PDA.   3.  Normal left ventricular function with EF of 60 to 65%.  There were no regional wall motion abnormalities. Plan: Recommend continue medical therapy.   Past Medical History:  Diagnosis Date   Anemia    Atrial fibrillation ([place])    pt has had an ablation   BPH (benign prostatic hyperplasia)    Elevated PSA   Childhood asthma    Coronary artery disease    post CABG x4 -- in 1995 -- due to three-vessel coronary artery disease   Diverticular disease    Dyslipidemia    First degree AV block    Gout    Hypertension    Osteoarthritis    "everywhere" (12/04/2014)   Prostate cancer ([place]) 01/30/2010   Dr Karsten Ro   Spinal stenosis 01/30/2002   L4 radiculopathy, herniated nucleus pulposus L4-5   Tinnitus    Type II diabetes mellitus (HCC)    Typical atrial flutter (HCC)    s/p ablation    Past Surgical History:  Procedure Laterality Date   APPENDECTOMY  1943   ATRIAL FLUTTER ABLATION  12/04/2014   BACK SURGERY     CARDIAC CATHETERIZATION  02/10/2010   Est. EF of  60-65% -- Severe three-vessel obstructive atherosclerotic coronary artery disease -- All grafts were patent including left internal mammary artery graft to left anterior descending coronary artery, saphenous vein graft to the diagonal, saphenous vein graft to obtuse marginal vessel, and saphenous vein graft to the posterior descending coronary artery -- Normal left ventricular function    CATARACT EXTRACTION Bilateral 2023   COLONOSCOPY     negative X3; Dr Carlean Purl   CORONARY ARTERY BYPASS GRAFT  10/21/1993   x4 -- using the LIM artery graft to the LAD artery, with saphenous vein grafts to the diagonal branch of the LAD, OM branch the left circumflex coronary artery, and the posterior descending branch of the RCA -- Est. EF of 65%-- Surgeon: Gaye Pollack, M.D.               ELECTROPHYSIOLOGIC STUDY N/A 10/01/2014   CTI ablation by Dr Curt Bears   ELECTROPHYSIOLOGIC STUDY N/A 12/04/2014   repeat CTI ablation by Dr Rayann Heman  FLEXIBLE SIGMOIDOSCOPY  01/31/2003    external hemorrhoids   JOINT REPLACEMENT     MICRODISCECTOMY LUMBAR  01/30/2002   L4-5, Dr. Arnoldo Morale   PROSTATE BIOPSY  10/30/2008   "had radiation tx for prostate cancer"   SALIVARY STONE REMOVAL  01/31/1999   TOTAL HIP ARTHROPLASTY Right 11/30/2008   TOTAL HIP ARTHROPLASTY Left 03/02/2010   Dr Alvan Dame    MEDICATIONS:  allopurinol (ZYLOPRIM) 300 MG tablet   apixaban (ELIQUIS) 5 MG TABS tablet   ezetimibe (ZETIA) 10 MG tablet   hydroxypropyl methylcellulose / hypromellose (ISOPTO TEARS / GONIOVISC) 2.5 % ophthalmic solution   JARDIANCE 25 MG TABS tablet   losartan (COZAAR) 25 MG tablet   Melatonin 5 MG TABS   metFORMIN (GLUCOPHAGE) 500 MG tablet   Multiple Vitamins-Minerals (CENTRUM PO)   OVER THE COUNTER MEDICATION   rosuvastatin (CRESTOR) 10 MG tablet   sodium chloride (OCEAN) 0.65 % SOLN nasal spray   venlafaxine XR (EFFEXOR-XR) 37.5 MG 24 hr capsule   No current facility-administered medications for this encounter.     Myra Gianotti, PA-C Surgical Short Stay/Anesthesiology [facility] Phone [phone] [facility] Phone [phone] 07/27/2021 9:58 AM

## 2021-07-27 NOTE — Anesthesia Preprocedure Evaluation (Addendum)
Anesthesia Evaluation  Patient identified by MRN, date of birth, ID band Patient awake    Reviewed: Allergy & Precautions, NPO status , Patient's Chart, lab work & pertinent test results  History of Anesthesia Complications Negative for: history of anesthetic complications  Airway Mallampati: II  TM Distance: >3 FB Neck ROM: Full    Dental no notable dental hx. (+) Dental Advisory Given   Pulmonary neg pulmonary ROS, former smoker,    Pulmonary exam normal        Cardiovascular hypertension, + CAD and + CABG  + dysrhythmias Atrial Fibrillation  Rhythm:Regular Rate:Normal + Systolic murmurs "Preoperative Cardiovascular Risk Assessment: He is doing well from a cardiac perspective and may proceed to surgery without further testing. According to the Revised Cardiac Risk Index (RCRI), his Perioperative Risk of Major Cardiac Event is (%): 0.9. His Functional Capacity in METs is: 5.62 according to the Duke Activity Status Index (DASI).  Echo 12/31/18: IMPRESSIONS  1. Left ventricular ejection fraction, by visual estimation, is 70 to  75%. The left ventricle has hyperdynamic function. There is no left  ventricular hypertrophy.  2. Left ventricular diastolic function could not be evaluated.  3. Global right ventricle has normal systolic function.The right  ventricular size is normal. No increase in right ventricular wall  thickness.  4. Left atrial size was mildly dilated.  5. Right atrial size was normal.  6. Moderate to severe aortic valve annular calcification.  7. Moderate to severe mitral annular calcification.  8. The mitral valve is grossly normal. Mild mitral valve regurgitation.  9. The tricuspid valve is normal in structure. Tricuspid valve  regurgitation is mild.  10. The aortic valve has an indeterminant number of cusps. Aortic valve  regurgitation is mild.  11. The pulmonic valve was normal in structure. Pulmonic  valve  regurgitation is trivial.  12. Aortic dilatation noted. Ao Root diameter 3.20 cm. Ao Asc diameter 3.75 cm. 13. There is mild dilatation of the ascending aorta.  14. Normal pulmonary artery systolic pressure.  15. The atrial septum is grossly normal.    Neuro/Psych negative neurological ROS     GI/Hepatic negative GI ROS, Neg liver ROS,   Endo/Other  diabetes  Renal/GU negative Renal ROS     Musculoskeletal negative musculoskeletal ROS (+)   Abdominal   Peds  Hematology negative hematology ROS (+)   Anesthesia Other Findings   Reproductive/Obstetrics                           Anesthesia Physical Anesthesia Plan  ASA: 3  Anesthesia Plan: General   Post-op Pain Management: Tylenol PO (pre-op)*   Induction: Intravenous  PONV Risk Score and Plan: 3 and Ondansetron, Dexamethasone and Diphenhydramine  Airway Management Planned: Oral ETT  Additional Equipment:   Intra-op Plan:   Post-operative Plan: Extubation in OR  Informed Consent: I have reviewed the patients History and Physical, chart, labs and discussed the procedure including the risks, benefits and alternatives for the proposed anesthesia with the patient or authorized representative who has indicated his/her understanding and acceptance.     Dental advisory given  Plan Discussed with: Anesthesiologist and CRNA  Anesthesia Plan Comments: (PAT note written 07/27/2021 by Myra Gianotti, PA-C. )     Anesthesia Quick Evaluation

## 2021-08-03 ENCOUNTER — Ambulatory Visit (HOSPITAL_COMMUNITY): Payer: Medicare PPO

## 2021-08-03 ENCOUNTER — Ambulatory Visit (HOSPITAL_BASED_OUTPATIENT_CLINIC_OR_DEPARTMENT_OTHER): Payer: Medicare PPO | Admitting: Anesthesiology

## 2021-08-03 ENCOUNTER — Other Ambulatory Visit: Payer: Self-pay

## 2021-08-03 ENCOUNTER — Encounter (HOSPITAL_COMMUNITY): Payer: Self-pay | Admitting: Neurosurgery

## 2021-08-03 ENCOUNTER — Encounter (HOSPITAL_COMMUNITY)
Admission: RE | Disposition: A | Discharge status: Home / Self Care | Payer: Self-pay | Source: Ambulatory Visit | Attending: Neurosurgery

## 2021-08-03 ENCOUNTER — Ambulatory Visit (HOSPITAL_COMMUNITY)
Admission: RE | Admit: 2021-08-03 | Discharge: 2021-08-04 | Disposition: A | Discharge status: Home / Self Care | Payer: Medicare PPO | Source: Ambulatory Visit | Attending: Neurosurgery | Admitting: Neurosurgery

## 2021-08-03 ENCOUNTER — Ambulatory Visit (HOSPITAL_COMMUNITY): Payer: Medicare PPO | Admitting: Vascular Surgery

## 2021-08-03 DIAGNOSIS — I083 Combined rheumatic disorders of mitral, aortic and tricuspid valves: Secondary | ICD-10-CM | POA: Diagnosis not present

## 2021-08-03 DIAGNOSIS — Z7984 Long term (current) use of oral hypoglycemic drugs: Secondary | ICD-10-CM | POA: Diagnosis not present

## 2021-08-03 DIAGNOSIS — E119 Type 2 diabetes mellitus without complications: Secondary | ICD-10-CM | POA: Insufficient documentation

## 2021-08-03 DIAGNOSIS — M48062 Spinal stenosis, lumbar region with neurogenic claudication: Secondary | ICD-10-CM | POA: Diagnosis present

## 2021-08-03 DIAGNOSIS — M4726 Other spondylosis with radiculopathy, lumbar region: Secondary | ICD-10-CM | POA: Insufficient documentation

## 2021-08-03 DIAGNOSIS — I10 Essential (primary) hypertension: Secondary | ICD-10-CM | POA: Insufficient documentation

## 2021-08-03 DIAGNOSIS — Z7901 Long term (current) use of anticoagulants: Secondary | ICD-10-CM | POA: Insufficient documentation

## 2021-08-03 DIAGNOSIS — Z79899 Other long term (current) drug therapy: Secondary | ICD-10-CM | POA: Insufficient documentation

## 2021-08-03 DIAGNOSIS — I4891 Unspecified atrial fibrillation: Secondary | ICD-10-CM

## 2021-08-03 DIAGNOSIS — Z951 Presence of aortocoronary bypass graft: Secondary | ICD-10-CM | POA: Insufficient documentation

## 2021-08-03 DIAGNOSIS — M5416 Radiculopathy, lumbar region: Secondary | ICD-10-CM | POA: Diagnosis not present

## 2021-08-03 DIAGNOSIS — M47816 Spondylosis without myelopathy or radiculopathy, lumbar region: Secondary | ICD-10-CM | POA: Diagnosis not present

## 2021-08-03 DIAGNOSIS — M4316 Spondylolisthesis, lumbar region: Secondary | ICD-10-CM | POA: Diagnosis not present

## 2021-08-03 DIAGNOSIS — Z87891 Personal history of nicotine dependence: Secondary | ICD-10-CM | POA: Diagnosis not present

## 2021-08-03 DIAGNOSIS — I2510 Atherosclerotic heart disease of native coronary artery without angina pectoris: Secondary | ICD-10-CM | POA: Diagnosis not present

## 2021-08-03 DIAGNOSIS — I700 Atherosclerosis of aorta: Secondary | ICD-10-CM | POA: Diagnosis not present

## 2021-08-03 DIAGNOSIS — Q766 Other congenital malformations of ribs: Secondary | ICD-10-CM | POA: Diagnosis not present

## 2021-08-03 HISTORY — PX: LUMBAR LAMINECTOMY/DECOMPRESSION MICRODISCECTOMY: SHX5026

## 2021-08-03 LAB — GLUCOSE, CAPILLARY
Glucose-Capillary: 118 mg/dL — ABNORMAL HIGH (ref 70–99)
Glucose-Capillary: 134 mg/dL — ABNORMAL HIGH (ref 70–99)
Glucose-Capillary: 180 mg/dL — ABNORMAL HIGH (ref 70–99)
Glucose-Capillary: 191 mg/dL — ABNORMAL HIGH (ref 70–99)
Glucose-Capillary: 214 mg/dL — ABNORMAL HIGH (ref 70–99)
Glucose-Capillary: 242 mg/dL — ABNORMAL HIGH (ref 70–99)

## 2021-08-03 SURGERY — LUMBAR LAMINECTOMY/DECOMPRESSION MICRODISCECTOMY 3 LEVELS
Anesthesia: General | Laterality: Bilateral

## 2021-08-03 MED ORDER — LIDOCAINE 2% (20 MG/ML) 5 ML SYRINGE
INTRAMUSCULAR | Status: DC | PRN
  Administered 2021-08-03: 100 mg via INTRAVENOUS

## 2021-08-03 MED ORDER — AMISULPRIDE (ANTIEMETIC) 5 MG/2ML IV SOLN: 10.0000 mg | Freq: Once | INTRAVENOUS | Status: DC | PRN

## 2021-08-03 MED ORDER — POLYVINYL ALCOHOL 1.4 % OP SOLN: 1.0000 [drp] | Freq: Three times a day (TID) | OPHTHALMIC | Status: DC | PRN

## 2021-08-03 MED ORDER — 0.9 % SODIUM CHLORIDE (POUR BTL) OPTIME
TOPICAL | Status: DC | PRN
  Administered 2021-08-03: 1000 mL

## 2021-08-03 MED ORDER — CHLORHEXIDINE GLUCONATE CLOTH 2 % EX PADS: 6.0000 | MEDICATED_PAD | Freq: Once | CUTANEOUS | Status: DC

## 2021-08-03 MED ORDER — PHENOL 1.4 % MT LIQD: 1.0000 | OROMUCOSAL | Status: DC | PRN

## 2021-08-03 MED ORDER — CHLORHEXIDINE GLUCONATE 0.12 % MT SOLN: 15.0000 mL | Freq: Once | OROMUCOSAL | Status: AC

## 2021-08-03 MED ORDER — CHLORHEXIDINE GLUCONATE 0.12 % MT SOLN
OROMUCOSAL | Status: AC
  Administered 2021-08-03: 15 mL via OROMUCOSAL
  Filled 2021-08-03: qty 15

## 2021-08-03 MED ORDER — DEXAMETHASONE SODIUM PHOSPHATE 10 MG/ML IJ SOLN
INTRAMUSCULAR | Status: DC | PRN
  Administered 2021-08-03: 5 mg via INTRAVENOUS

## 2021-08-03 MED ORDER — ACETAMINOPHEN 500 MG PO TABS: 1000.0000 mg | ORAL_TABLET | Freq: Once | ORAL | Status: AC

## 2021-08-03 MED ORDER — DEXAMETHASONE SODIUM PHOSPHATE 10 MG/ML IJ SOLN
INTRAMUSCULAR | Status: AC
  Filled 2021-08-03: qty 1

## 2021-08-03 MED ORDER — ACETAMINOPHEN 500 MG PO TABS
ORAL_TABLET | ORAL | Status: AC
  Administered 2021-08-03: 1000 mg via ORAL
  Filled 2021-08-03: qty 2

## 2021-08-03 MED ORDER — BUPIVACAINE-EPINEPHRINE (PF) 0.5% -1:200000 IJ SOLN
INTRAMUSCULAR | Status: DC | PRN
  Administered 2021-08-03: 10 mL via PERINEURAL

## 2021-08-03 MED ORDER — OXYCODONE HCL 5 MG PO TABS: 5.0000 mg | ORAL_TABLET | ORAL | Status: DC | PRN

## 2021-08-03 MED ORDER — PROPOFOL 10 MG/ML IV BOLUS
INTRAVENOUS | Status: DC | PRN
  Administered 2021-08-03: 100 mg via INTRAVENOUS
  Administered 2021-08-03: 20 mg via INTRAVENOUS
  Administered 2021-08-03: 30 mg via INTRAVENOUS

## 2021-08-03 MED ORDER — LIDOCAINE 2% (20 MG/ML) 5 ML SYRINGE
INTRAMUSCULAR | Status: AC
Start: 2021-08-03 — End: ?
  Filled 2021-08-03: qty 5

## 2021-08-03 MED ORDER — PROPOFOL 10 MG/ML IV BOLUS
INTRAVENOUS | Status: AC
  Filled 2021-08-03: qty 20

## 2021-08-03 MED ORDER — LOSARTAN POTASSIUM 50 MG PO TABS
25.0000 mg | ORAL_TABLET | Freq: Every day | ORAL | Status: DC
  Administered 2021-08-03 – 2021-08-04 (×2): 25 mg via ORAL
  Filled 2021-08-03 (×2): qty 1

## 2021-08-03 MED ORDER — BUPIVACAINE-EPINEPHRINE 0.5% -1:200000 IJ SOLN
INTRAMUSCULAR | Status: AC
Start: 2021-08-03 — End: ?
  Filled 2021-08-03: qty 1

## 2021-08-03 MED ORDER — LACTATED RINGERS IV SOLN: INTRAVENOUS | Status: DC

## 2021-08-03 MED ORDER — INSULIN ASPART 100 UNIT/ML IJ SOLN
0.0000 [IU] | INTRAMUSCULAR | Status: DC
  Administered 2021-08-03: 7 [IU] via SUBCUTANEOUS
  Administered 2021-08-03 – 2021-08-04 (×2): 4 [IU] via SUBCUTANEOUS
  Administered 2021-08-04: 3 [IU] via SUBCUTANEOUS

## 2021-08-03 MED ORDER — CEFAZOLIN SODIUM-DEXTROSE 2-4 GM/100ML-% IV SOLN
2.0000 g | INTRAVENOUS | Status: AC
  Administered 2021-08-03: 2 g via INTRAVENOUS

## 2021-08-03 MED ORDER — CYCLOBENZAPRINE HCL 10 MG PO TABS: 10.0000 mg | ORAL_TABLET | Freq: Three times a day (TID) | ORAL | Status: DC | PRN

## 2021-08-03 MED ORDER — ROSUVASTATIN CALCIUM 5 MG PO TABS
10.0000 mg | ORAL_TABLET | Freq: Every day | ORAL | Status: DC
  Administered 2021-08-03 – 2021-08-04 (×2): 10 mg via ORAL
  Filled 2021-08-03 (×2): qty 2

## 2021-08-03 MED ORDER — ACETAMINOPHEN 325 MG PO TABS: 650.0000 mg | ORAL_TABLET | ORAL | Status: DC | PRN

## 2021-08-03 MED ORDER — METFORMIN HCL 500 MG PO TABS
500.0000 mg | ORAL_TABLET | Freq: Two times a day (BID) | ORAL | Status: DC
  Administered 2021-08-03 – 2021-08-04 (×2): 500 mg via ORAL
  Filled 2021-08-03 (×2): qty 1

## 2021-08-03 MED ORDER — ROCURONIUM BROMIDE 10 MG/ML (PF) SYRINGE
PREFILLED_SYRINGE | INTRAVENOUS | Status: AC
  Filled 2021-08-03: qty 10

## 2021-08-03 MED ORDER — INSULIN ASPART 100 UNIT/ML IJ SOLN
0.0000 [IU] | INTRAMUSCULAR | Status: DC | PRN
  Administered 2021-08-03: 2 [IU] via SUBCUTANEOUS

## 2021-08-03 MED ORDER — ONDANSETRON HCL 4 MG PO TABS: 4.0000 mg | ORAL_TABLET | Freq: Four times a day (QID) | ORAL | Status: DC | PRN

## 2021-08-03 MED ORDER — THROMBIN 5000 UNITS EX SOLR
OROMUCOSAL | Status: DC | PRN
  Administered 2021-08-03: 5 mL via TOPICAL

## 2021-08-03 MED ORDER — SODIUM CHLORIDE 0.9% FLUSH: 3.0000 mL | INTRAVENOUS | Status: DC | PRN

## 2021-08-03 MED ORDER — EZETIMIBE 10 MG PO TABS
10.0000 mg | ORAL_TABLET | Freq: Every day | ORAL | Status: DC
  Administered 2021-08-03 – 2021-08-04 (×2): 10 mg via ORAL
  Filled 2021-08-03 (×2): qty 1

## 2021-08-03 MED ORDER — DOCUSATE SODIUM 100 MG PO CAPS
100.0000 mg | ORAL_CAPSULE | Freq: Two times a day (BID) | ORAL | Status: DC
Start: 2021-08-03 — End: 2021-08-04
  Administered 2021-08-03 – 2021-08-04 (×3): 100 mg via ORAL
  Filled 2021-08-03 (×3): qty 1

## 2021-08-03 MED ORDER — ONDANSETRON HCL 4 MG/2ML IJ SOLN
INTRAMUSCULAR | Status: DC | PRN
  Administered 2021-08-03: 4 mg via INTRAVENOUS

## 2021-08-03 MED ORDER — CEFAZOLIN SODIUM-DEXTROSE 2-4 GM/100ML-% IV SOLN
INTRAVENOUS | Status: AC
  Administered 2021-08-04: 2 g via INTRAVENOUS
  Filled 2021-08-03: qty 100

## 2021-08-03 MED ORDER — SUGAMMADEX SODIUM 200 MG/2ML IV SOLN
INTRAVENOUS | Status: DC | PRN
  Administered 2021-08-03: 100 mg via INTRAVENOUS
  Administered 2021-08-03: 200 mg via INTRAVENOUS

## 2021-08-03 MED ORDER — ACETAMINOPHEN 500 MG PO TABS
1000.0000 mg | ORAL_TABLET | Freq: Four times a day (QID) | ORAL | Status: DC
  Administered 2021-08-03 – 2021-08-04 (×3): 1000 mg via ORAL
  Filled 2021-08-03 (×3): qty 2

## 2021-08-03 MED ORDER — VENLAFAXINE HCL ER 37.5 MG PO CP24
37.5000 mg | ORAL_CAPSULE | Freq: Every day | ORAL | Status: DC
  Administered 2021-08-04: 37.5 mg via ORAL
  Filled 2021-08-03: qty 1

## 2021-08-03 MED ORDER — BACITRACIN ZINC 500 UNIT/GM EX OINT
TOPICAL_OINTMENT | CUTANEOUS | Status: AC
  Filled 2021-08-03: qty 28.35

## 2021-08-03 MED ORDER — FENTANYL CITRATE (PF) 250 MCG/5ML IJ SOLN
INTRAMUSCULAR | Status: DC | PRN
Start: 2021-08-03 — End: 2021-08-03
  Administered 2021-08-03: 25 ug via INTRAVENOUS
  Administered 2021-08-03: 50 ug via INTRAVENOUS
  Administered 2021-08-03: 100 ug via INTRAVENOUS
  Administered 2021-08-03: 25 ug via INTRAVENOUS

## 2021-08-03 MED ORDER — SALINE SPRAY 0.65 % NA SOLN
1.0000 | Freq: Every day | NASAL | Status: DC
  Filled 2021-08-03: qty 44

## 2021-08-03 MED ORDER — SODIUM CHLORIDE 0.9% FLUSH
3.0000 mL | Freq: Two times a day (BID) | INTRAVENOUS | Status: DC
  Administered 2021-08-03 – 2021-08-04 (×3): 3 mL via INTRAVENOUS

## 2021-08-03 MED ORDER — ONDANSETRON HCL 4 MG/2ML IJ SOLN: 4.0000 mg | Freq: Four times a day (QID) | INTRAMUSCULAR | Status: DC | PRN

## 2021-08-03 MED ORDER — ROCURONIUM BROMIDE 10 MG/ML (PF) SYRINGE
PREFILLED_SYRINGE | INTRAVENOUS | Status: DC | PRN
  Administered 2021-08-03: 100 mg via INTRAVENOUS

## 2021-08-03 MED ORDER — ACETAMINOPHEN 650 MG RE SUPP: 650.0000 mg | RECTAL | Status: DC | PRN

## 2021-08-03 MED ORDER — MENTHOL 3 MG MT LOZG: 1.0000 | LOZENGE | OROMUCOSAL | Status: DC | PRN

## 2021-08-03 MED ORDER — ONDANSETRON HCL 4 MG/2ML IJ SOLN
INTRAMUSCULAR | Status: AC
  Filled 2021-08-03: qty 2

## 2021-08-03 MED ORDER — THROMBIN 5000 UNITS EX SOLR
CUTANEOUS | Status: AC
Start: 2021-08-03 — End: ?
  Filled 2021-08-03: qty 5000

## 2021-08-03 MED ORDER — BISACODYL 10 MG RE SUPP: 10.0000 mg | Freq: Every day | RECTAL | Status: DC | PRN

## 2021-08-03 MED ORDER — FENTANYL CITRATE (PF) 100 MCG/2ML IJ SOLN: 25.0000 ug | INTRAMUSCULAR | Status: DC | PRN

## 2021-08-03 MED ORDER — PROMETHAZINE HCL 25 MG/ML IJ SOLN: 6.2500 mg | INTRAMUSCULAR | Status: DC | PRN

## 2021-08-03 MED ORDER — EMPAGLIFLOZIN 25 MG PO TABS
25.0000 mg | ORAL_TABLET | Freq: Every day | ORAL | Status: DC
  Administered 2021-08-03 – 2021-08-04 (×2): 25 mg via ORAL
  Filled 2021-08-03 (×3): qty 1

## 2021-08-03 MED ORDER — FENTANYL CITRATE (PF) 250 MCG/5ML IJ SOLN
INTRAMUSCULAR | Status: AC
  Filled 2021-08-03: qty 5

## 2021-08-03 MED ORDER — ALLOPURINOL 300 MG PO TABS
300.0000 mg | ORAL_TABLET | Freq: Every day | ORAL | Status: DC
  Administered 2021-08-03 – 2021-08-04 (×2): 300 mg via ORAL
  Filled 2021-08-03 (×2): qty 1

## 2021-08-03 MED ORDER — MORPHINE SULFATE (PF) 4 MG/ML IV SOLN: 4.0000 mg | INTRAVENOUS | Status: DC | PRN

## 2021-08-03 MED ORDER — OXYCODONE HCL 5 MG PO TABS: 10.0000 mg | ORAL_TABLET | ORAL | Status: DC | PRN

## 2021-08-03 MED ORDER — SODIUM CHLORIDE 0.9 % IV SOLN
250.0000 mL | INTRAVENOUS | Status: DC
  Administered 2021-08-03: 250 mL via INTRAVENOUS

## 2021-08-03 MED ORDER — CEFAZOLIN SODIUM-DEXTROSE 2-4 GM/100ML-% IV SOLN
2.0000 g | Freq: Three times a day (TID) | INTRAVENOUS | Status: AC
  Administered 2021-08-03: 2 g via INTRAVENOUS
  Filled 2021-08-03 (×2): qty 100

## 2021-08-03 MED ORDER — ORAL CARE MOUTH RINSE: 15.0000 mL | Freq: Once | OROMUCOSAL | Status: AC

## 2021-08-03 SURGICAL SUPPLY — 48 items
BAG COUNTER SPONGE SURGICOUNT (BAG) ×3 IMPLANT
BAND RUBBER #18 3X1/16 STRL (MISCELLANEOUS) ×4 IMPLANT
BENZOIN TINCTURE PRP APPL 2/3 (GAUZE/BANDAGES/DRESSINGS) ×2 IMPLANT
BLADE CLIPPER SURG (BLADE) IMPLANT
BUR MATCHSTICK NEURO 3.0 LAGG (BURR) ×2 IMPLANT
BUR PRECISION FLUTE 6.0 (BURR) ×2 IMPLANT
CANISTER SUCT 3000ML PPV (MISCELLANEOUS) ×2 IMPLANT
CARTRIDGE OIL MAESTRO DRILL (MISCELLANEOUS) ×1 IMPLANT
DIFFUSER DRILL AIR PNEUMATIC (MISCELLANEOUS) ×2 IMPLANT
DRAPE LAPAROTOMY 100X72X124 (DRAPES) ×2 IMPLANT
DRAPE MICROSCOPE LEICA (MISCELLANEOUS) ×1 IMPLANT
DRAPE SURG 17X23 STRL (DRAPES) ×4 IMPLANT
DRSG OPSITE POSTOP 4X6 (GAUZE/BANDAGES/DRESSINGS) ×1 IMPLANT
DRSG OPSITE POSTOP 4X8 (GAUZE/BANDAGES/DRESSINGS) ×1 IMPLANT
ELECT BLADE 4.0 EZ CLEAN MEGAD (MISCELLANEOUS) ×2
ELECT REM PT RETURN 9FT ADLT (ELECTROSURGICAL) ×2
ELECTRODE BLDE 4.0 EZ CLN MEGD (MISCELLANEOUS) ×1 IMPLANT
ELECTRODE REM PT RTRN 9FT ADLT (ELECTROSURGICAL) ×1 IMPLANT
EVACUATOR 1/8 PVC DRAIN (DRAIN) ×1 IMPLANT
GAUZE 4X4 16PLY ~~LOC~~+RFID DBL (SPONGE) IMPLANT
GAUZE SPONGE 4X4 12PLY STRL (GAUZE/BANDAGES/DRESSINGS) ×2 IMPLANT
GLOVE BIO SURGEON STRL SZ8 (GLOVE) ×2 IMPLANT
GLOVE BIO SURGEON STRL SZ8.5 (GLOVE) ×2 IMPLANT
GLOVE EXAM NITRILE XL STR (GLOVE) IMPLANT
GLOVE SURG SS PI 6.5 STRL IVOR (GLOVE) ×4 IMPLANT
GOWN STRL REUS W/ TWL LRG LVL3 (GOWN DISPOSABLE) IMPLANT
GOWN STRL REUS W/ TWL XL LVL3 (GOWN DISPOSABLE) ×1 IMPLANT
GOWN STRL REUS W/TWL 2XL LVL3 (GOWN DISPOSABLE) IMPLANT
GOWN STRL REUS W/TWL LRG LVL3 (GOWN DISPOSABLE) ×2
GOWN STRL REUS W/TWL XL LVL3 (GOWN DISPOSABLE) ×2
KIT BASIN OR (CUSTOM PROCEDURE TRAY) ×2 IMPLANT
KIT TURNOVER KIT B (KITS) ×2 IMPLANT
NDL HYPO 21X1.5 SAFETY (NEEDLE) IMPLANT
NEEDLE HYPO 21X1.5 SAFETY (NEEDLE) IMPLANT
NEEDLE HYPO 22GX1.5 SAFETY (NEEDLE) ×2 IMPLANT
NS IRRIG 1000ML POUR BTL (IV SOLUTION) ×2 IMPLANT
OIL CARTRIDGE MAESTRO DRILL (MISCELLANEOUS) ×2
PACK LAMINECTOMY NEURO (CUSTOM PROCEDURE TRAY) ×2 IMPLANT
PAD ARMBOARD 7.5X6 YLW CONV (MISCELLANEOUS) ×6 IMPLANT
PATTIES SURGICAL .5 X1 (DISPOSABLE) IMPLANT
SPONGE SURGIFOAM ABS GEL SZ50 (HEMOSTASIS) ×2 IMPLANT
STRIP CLOSURE SKIN 1/2X4 (GAUZE/BANDAGES/DRESSINGS) ×2 IMPLANT
SUT VIC AB 1 CT1 18XBRD ANBCTR (SUTURE) ×2 IMPLANT
SUT VIC AB 1 CT1 8-18 (SUTURE) ×4
SUT VIC AB 2-0 CP2 18 (SUTURE) ×4 IMPLANT
TOWEL GREEN STERILE (TOWEL DISPOSABLE) ×2 IMPLANT
TOWEL GREEN STERILE FF (TOWEL DISPOSABLE) ×2 IMPLANT
WATER STERILE IRR 1000ML POUR (IV SOLUTION) ×2 IMPLANT

## 2021-08-03 NOTE — Progress Notes (Signed)
Pt arrived to room 5N18 via wheelchair after surgery. Received report from Richardson Landry, [place] in PACU. See assessment. Will continue to monitor.

## 2021-08-03 NOTE — Anesthesia Postprocedure Evaluation (Signed)
Anesthesia Post Note  Patient: Bryce Maldonado  Procedure(s) Performed: Lumbar two-three, Lumbar three-four, Lumbar four-five BILATERAL LAMINOTOMY/FORAMINOTOMY (Bilateral)     Patient location during evaluation: PACU Anesthesia Type: General Level of consciousness: sedated Pain management: pain level controlled Vital Signs Assessment: post-procedure vital signs reviewed and stable Respiratory status: spontaneous breathing and respiratory function stable Cardiovascular status: stable Postop Assessment: no apparent nausea or vomiting Anesthetic complications: no   No notable events documented.  Last Vitals:  Vitals:   08/03/21 1245 08/03/21 1300  BP: (!) 140/47 (!) 151/67  Pulse: 79 84  Resp: 14 18  Temp:  (!) 36.1 C  SpO2: 93% 93%    Last Pain:  Vitals:   08/03/21 1300  TempSrc:   PainSc: 2                  Hero Kulish DANIEL

## 2021-08-03 NOTE — Op Note (Addendum)
Brief history: The patient is a 81 year old white male who has complained of back and leg pain consistent with neurogenic claudication.  He has failed medical management and was worked up with lumbar x-rays and lumbar MRI which demonstrated lumbar spinal stenosis.  I discussed the various treatment options with him.  He has decided proceed with surgery.  Preoperative diagnosis: Lumbar spinal stenosis, lumbago, lumbar radiculopathy, neurogenic claudication  Postoperative diagnosis: The same  Procedure: Bilateral L2-3, L3-4 and L4-5 laminotomy/foraminotomies  Surgeon: Dr. Earle Gell  Asst.: Arnetha Massy, NP  Anesthesia: Gen. endotracheal  Estimated blood loss: 100 cc  Drains: 1 medium Hemovac in the epidural space.  Complications: None  Description of procedure: The patient was brought to the operating room by the anesthesia team. General endotracheal anesthesia was induced. The patient was turned to the prone position on the Wilson frame. The patient's lumbosacral region was then prepared with Betadine scrub and Betadine solution. Sterile drapes were applied.  I then injected the area to be incised with Marcaine with epinephrine solution. I then used a scalpel to make a linear midline incision over the L2-3, L3-4 and L4-5 intervertebral disc space. I then used electrocautery to perform a left sided subperiosteal dissection exposing the spinous process and lamina of L2-3 L3-4 and L4-5. We obtained intraoperative radiograph to confirm our location. I then inserted the [facility] retractor for exposure.  I used a high-speed drill to perform a laminotomy at L2-3, L3-4 and L4-5 on the left. I then used a Kerrison punches to widen the laminotomy and removed the ligamentum flavum at L2-3, L3-4 and L4-5 on the left.  We performed foraminotomies about the left L3, L4 and L5 nerve roots.  We then drilled across the midline exposing the right L2-3 L3-4 and L4-5 lamina and ligamentum flavum.  Remove  the right-sided ligament and excess bone with a Kerrison punch performing foraminotomies about the right L3, L4 and L5 nerve roots..  We inspected the intervertebral disc at L2-3, L3-4 and L4-5.  There were no significant herniations.  I then palpated along the ventral surface of the thecal sac and along exit route of the right L3, L4 and L5 nerve root and noted that the neural structures were well decompressed. This completed the decompression.  We then obtained hemostasis using bipolar electrocautery. We irrigated the wound out with saline solution.  We placed a medium Hemovac drain in the prevertebral/epidural space and tunneled it out through a separate stab wound.  We then removed the retractor. We then reapproximated the patient's thoracolumbar fascia with interrupted #1 Vicryl suture. We then reapproximated the patient's subcutaneous tissue with interrupted 2-0 Vicryl suture. We then reapproximated patient's skin with Steri-Strips and benzoin. The was then coated with bacitracin ointment. The drapes were removed. The patient was subsequently returned to the supine position where they were extubated by the anesthesia team. The patient was then transported to the postanesthesia care unit in stable condition. All sponge instrument and needle counts were reportedly correct at the end of this case.

## 2021-08-03 NOTE — Plan of Care (Signed)
  Problem: Education: Goal: Knowledge of General Education information will improve Description: Including pain rating scale, medication(s)/side effects and non-pharmacologic comfort measures Outcome: Progressing   Problem: Health Behavior/Discharge Planning: Goal: Ability to manage health-related needs will improve Outcome: Progressing   Problem: Clinical Measurements: Goal: Respiratory complications will improve Outcome: Progressing Goal: Cardiovascular complication will be avoided Outcome: Progressing   Problem: Activity: Goal: Risk for activity intolerance will decrease Outcome: Progressing   Problem: Nutrition: Goal: Adequate nutrition will be maintained Outcome: Progressing   Problem: Pain Managment: Goal: General experience of comfort will improve Outcome: Progressing   Problem: Safety: Goal: Ability to remain free from injury will improve Outcome: Progressing   Problem: Skin Integrity: Goal: Risk for impaired skin integrity will decrease Outcome: Progressing   Problem: Fluid Volume: Goal: Ability to maintain a balanced intake and output will improve Outcome: Progressing

## 2021-08-03 NOTE — Anesthesia Procedure Notes (Signed)
Procedure Name: Intubation Date/Time: 08/03/2021 10:15 AM  Performed by: Erick Colace, CRNAPre-anesthesia Checklist: Patient identified, Emergency Drugs available, Suction available and Patient being monitored Patient Re-evaluated:Patient Re-evaluated prior to induction Oxygen Delivery Method: Circle system utilized Preoxygenation: Pre-oxygenation with 100% oxygen Induction Type: IV induction Ventilation: Mask ventilation without difficulty Laryngoscope Size: Mac and 4 Grade View: Grade I Tube type: Oral Tube size: 7.5 mm Number of attempts: 1 Airway Equipment and Method: Stylet and Oral airway Placement Confirmation: ETT inserted through vocal cords under direct vision, positive ETCO2 and breath sounds checked- equal and bilateral Secured at: 22 cm Tube secured with: Tape Dental Injury: Teeth and Oropharynx as per pre-operative assessment

## 2021-08-03 NOTE — Transfer of Care (Signed)
Immediate Anesthesia Transfer of Care Note  Patient: Bryce Maldonado  Procedure(s) Performed: Lumbar two-three, Lumbar three-four, Lumbar four-five BILATERAL LAMINOTOMY/FORAMINOTOMY (Bilateral)  Patient Location: PACU  Anesthesia Type:General  Level of Consciousness: drowsy  Airway & Oxygen Therapy: Patient Spontanous Breathing and Patient connected to face mask oxygen  Post-op Assessment: Report given to RN and Post -op Vital signs reviewed and stable  Post vital signs: Reviewed and stable  Last Vitals:  Vitals Value Taken Time  BP 152/85 08/03/21 1222  Temp    Pulse 88 08/03/21 1224  Resp 13 08/03/21 1223  SpO2 96 % 08/03/21 1224  Vitals shown include unvalidated device data.  Last Pain:  Vitals:   08/03/21 0832  TempSrc:   PainSc: 0-No pain         Complications: No notable events documented.

## 2021-08-03 NOTE — H&P (Signed)
Subjective: The patient is a 81 year old white male who has complained of back and bilateral leg pain consistent with neurogenic claudication.  He has failed medical management and was worked up with a lumbar MRI which demonstrated spinal stenosis.  I discussed the various treatment options with him.  He has decided to proceed with surgery.  Past Medical History:  Diagnosis Date   Anemia    Atrial fibrillation ([place])    pt has had an ablation   BPH (benign prostatic hyperplasia)    Elevated PSA   Childhood asthma    Coronary artery disease    post CABG x4 -- in 1995 -- due to three-vessel coronary artery disease   Diverticular disease    Dyslipidemia    First degree AV block    Gout    Hypertension    Osteoarthritis    "everywhere" (12/04/2014)   Prostate cancer ([place]) 01/30/2010   Dr Karsten Ro   Spinal stenosis 01/30/2002   L4 radiculopathy, herniated nucleus pulposus L4-5   Tinnitus    Type II diabetes mellitus (HCC)    Typical atrial flutter (HCC)    s/p ablation    Past Surgical History:  Procedure Laterality Date   APPENDECTOMY  1943   ATRIAL FLUTTER ABLATION  12/04/2014   BACK SURGERY     CARDIAC CATHETERIZATION  02/10/2010   Est. EF of 60-65% -- Severe three-vessel obstructive atherosclerotic coronary artery disease -- All grafts were patent including left internal mammary artery graft to left anterior descending coronary artery, saphenous vein graft to the diagonal, saphenous vein graft to obtuse marginal vessel, and saphenous vein graft to the posterior descending coronary artery -- Normal left ventricular function    CATARACT EXTRACTION Bilateral 2023   COLONOSCOPY     negative X3; Dr Carlean Purl   CORONARY ARTERY BYPASS GRAFT  10/21/1993   x4 -- using the LIM artery graft to the LAD artery, with saphenous vein grafts to the diagonal branch of the LAD, OM branch the left circumflex coronary artery, and the posterior descending branch of the RCA -- Est. EF of 65%-- Surgeon:  Gaye Pollack, M.D.               ELECTROPHYSIOLOGIC STUDY N/A 10/01/2014   CTI ablation by Dr Curt Bears   ELECTROPHYSIOLOGIC STUDY N/A 12/04/2014   repeat CTI ablation by Dr Rayann Heman   FLEXIBLE SIGMOIDOSCOPY  01/31/2003    external hemorrhoids   JOINT REPLACEMENT     MICRODISCECTOMY LUMBAR  01/30/2002   L4-5, Dr. Arnoldo Morale   PROSTATE BIOPSY  10/30/2008   "had radiation tx for prostate cancer"   SALIVARY STONE REMOVAL  01/31/1999   TOTAL HIP ARTHROPLASTY Right 11/30/2008   TOTAL HIP ARTHROPLASTY Left 03/02/2010   Dr Alvan Dame    Allergies  Allergen Reactions   Naproxen Sodium Rash    Social History   Tobacco Use   Smoking status: Former    Packs/day: 2.50    Years: 15.00    Total pack years: 37.50    Types: Cigarettes    Quit date: 01/30/1970    Years since quitting: 51.5   Smokeless tobacco: Never   Tobacco comments:    smoked 1959-1972  Substance Use Topics   Alcohol use: Not Currently    Alcohol/week: 2.0 standard drinks of alcohol    Types: 1 Cans of beer, 1 Standard drinks or equivalent per week    Comment: maybe a beer or 1 liquor drink once every 2 weeks    Family History  Problem Relation Age of Onset   Diabetes Father    Hypertension Father    Lung cancer Father        smoker   Heart failure Mother 94   Hyperlipidemia Sister    Coronary artery disease Sister    Heart attack Paternal Aunt        X 2; both > 3   Colon cancer Neg Hx    Stroke Neg Hx    Prior to Admission medications   Medication Sig Start Date End Date Taking? Authorizing Provider  allopurinol (ZYLOPRIM) 300 MG tablet TAKE 1 TABLET(300 MG) BY MOUTH DAILY 05/31/21  Yes Burns, Claudina Lick, MD  apixaban (ELIQUIS) 5 MG TABS tablet Take 1 tablet (5 mg total) by mouth 2 (two) times daily. 02/22/21  Yes Martinique, Peter M, MD  ezetimibe (ZETIA) 10 MG tablet TAKE 1 TABLET(10 MG) BY MOUTH AT BEDTIME 03/02/21  Yes [place], Peter M, MD  hydroxypropyl methylcellulose / hypromellose (ISOPTO TEARS / GONIOVISC) 2.5 %  ophthalmic solution Place 1 drop into both eyes in the morning and at bedtime.   Yes [provider]  JARDIANCE 25 MG TABS tablet TAKE 1 TABLET(25 MG) BY MOUTH DAILY 12/27/20  Yes Burns, Claudina Lick, MD  losartan (COZAAR) 25 MG tablet TAKE 1 TABLET(25 MG) BY MOUTH DAILY 05/31/21  Yes Burns, Claudina Lick, MD  Melatonin 5 MG TABS Take 5 mg by mouth at bedtime.   Yes [provider]  metFORMIN (GLUCOPHAGE) 500 MG tablet TAKE 2 TABLETS(1000 MG) BY MOUTH TWICE DAILY WITH A MEAL 12/27/20  Yes Burns, Claudina Lick, MD  Multiple Vitamins-Minerals (CENTRUM PO) Take 1 tablet by mouth daily.   Yes [provider]  OVER THE COUNTER MEDICATION Take 3 capsules by mouth daily. Balance of Nature Fruits and Veggies   Yes [provider]  rosuvastatin (CRESTOR) 10 MG tablet TAKE 1 TABLET(10 MG) BY MOUTH AT BEDTIME 05/31/21  Yes Martinique, Peter M, MD  sodium chloride (OCEAN) 0.65 % SOLN nasal spray Place 1 spray into both nostrils at bedtime.   Yes [provider]  venlafaxine XR (EFFEXOR-XR) 37.5 MG 24 hr capsule TAKE 1 CAPSULE(37.5 MG) BY MOUTH DAILY WITH BREAKFAST 12/01/20  Yes Burns, Claudina Lick, MD     Review of Systems  Positive ROS: As above  All other systems have been reviewed and were otherwise negative with the exception of those mentioned in the HPI and as above.  Objective: Vital signs in last 24 hours: Temp:  [98 F (36.7 C)] 98 F (36.7 C) (07/05 0831) Pulse Rate:  [87] 87 (07/05 0831) Resp:  [18] 18 (07/05 0831) BP: (153)/(63) 153/63 (07/05 0831) SpO2:  [94 %] 94 % (07/05 0831) Weight:  [86.6 kg] 86.6 kg (07/05 0831) Estimated body mass index is 25.9 kg/m as calculated from the following:   Height as of this encounter: 6' (1.829 m).   Weight as of this encounter: 86.6 kg.   General Appearance: Alert Head: Normocephalic, without obvious abnormality, atraumatic Eyes: PERRL, conjunctiva/corneas clear, EOM's intact,    Ears: Normal  Throat: Normal  Neck:  Supple, Back: unremarkable Lungs: Clear to auscultation bilaterally, respirations unlabored Heart: Regular rate and rhythm, no murmur, rub or gallop Abdomen: Soft, non-tender Extremities: Extremities normal, atraumatic, no cyanosis or edema Skin: unremarkable  NEUROLOGIC:   Mental status: alert and oriented,Motor Exam - grossly normal Sensory Exam - grossly normal Reflexes:  Coordination - grossly normal Gait - grossly normal Balance - grossly normal Cranial Nerves: I:  smell Not tested  II: visual acuity  OS: Normal  OD: Normal   II: visual fields Full to confrontation  II: pupils Equal, round, reactive to light  III,VII: ptosis None  III,IV,VI: extraocular muscles  Full ROM  V: mastication Normal  V: facial light touch sensation  Normal  V,VII: corneal reflex  Present  VII: facial muscle function - upper  Normal  VII: facial muscle function - lower Normal  VIII: hearing Not tested  IX: soft palate elevation  Normal  IX,X: gag reflex Present  XI: trapezius strength  5/5  XI: sternocleidomastoid strength 5/5  XI: neck flexion strength  5/5  XII: tongue strength  Normal    Data Review Lab Results  Component Value Date   WBC 6.0 07/26/2021   HGB 15.1 07/26/2021   HCT 45.4 07/26/2021   MCV 93.4 07/26/2021   PLT 248 07/26/2021   Lab Results  Component Value Date   NA 139 07/26/2021   K 4.0 07/26/2021   CL 102 07/26/2021   CO2 24 07/26/2021   BUN 22 07/26/2021   CREATININE 0.70 07/26/2021   GLUCOSE 129 (H) 07/26/2021   Lab Results  Component Value Date   INR 0.97 03/04/2010    Assessment/Plan: Lumbar spinal stenosis, lumbago, lumbar radiculopathy, neurogenic claudication: I have discussed the situation with the patient.  I reviewed his imaging studies with him and pointed out the abnormalities.  We have discussed the various treatment options including surgery.  I have described the surgical treatment option of bilateral L2-3, L3-4 and L4-5  laminotomy/foraminotomies.  I have shown him surgical models.  I have given him a surgical pamphlet.  We have discussed the risk, benefits, alternatives, expected postop course, and likelihood of achieving our goals with surgery.  I have answered all his questions.  He has decided proceed with surgery.   Ophelia Charter 08/03/2021 9:59 AM

## 2021-08-03 NOTE — Evaluation (Signed)
Physical Therapy Evaluation Patient Details Name: Bryce Maldonado MRN: [id number] DOB: 1940/05/17 Today's Date: 08/03/2021  History of Present Illness  81 y.o. male underwent Bilateral L2-3, L3-4 and L4-5 laminotomy/foraminotomies on 08/03/21. Hx of afib, BPH, anemia, OA, DMII.  Clinical Impression  Patient is s/p above surgery, presenting with functional limitations due to the deficits listed below (see PT Problem List). Educated on precautions, safe bed mobility, transfers, and ambulated up to 95 feet with RW and min guard for safety. Pt eager to return home, complains only of incisional site pain, strong family support with wife available 24/7 at d/c.  Patient will benefit from skilled PT to increase their independence and safety with mobility to allow discharge to the venue listed below.           Recommendations for follow up therapy are one component of a multi-disciplinary discharge planning process, led by the attending physician.  Recommendations may be updated based on patient status, additional functional criteria and insurance authorization.  Follow Up Recommendations Follow physician's recommendations for discharge plan and follow up therapies      Assistance Recommended at Discharge Intermittent Supervision/Assistance  Patient can return home with the following  A little help with walking and/or transfers;Assistance with cooking/housework;Assist for transportation;A little help with bathing/dressing/bathroom    Equipment Recommendations None recommended by PT  Recommendations for Other Services       Functional Status Assessment Patient has had a recent decline in their functional status and demonstrates the ability to make significant improvements in function in a reasonable and predictable amount of time.     Precautions / Restrictions Precautions Precautions: Back Precaution Booklet Issued: Yes (comment) Precaution Comments: Reviewed handout with pt and  wife Restrictions Weight Bearing Restrictions: No      Mobility  Bed Mobility Overal bed mobility: Needs Assistance Bed Mobility: Rolling, Sidelying to Sit Rolling: Min guard Sidelying to sit: Min assist       General bed mobility comments: Educated on log roll technique, used rail to roll onto Lt side min guard for safety, min assist to rise to EOB with trunk support, cues throughout for technique.    Transfers Overall transfer level: Needs assistance Equipment used: Rolling walker (2 wheels) Transfers: Sit to/from Stand Sit to Stand: Min assist           General transfer comment: Min assist for boost to stand from bed, min guard for safety from recliner. Cues for hand placement, back precautions, and use of RW upon standing to stabilize. Cues for stand to sit transition.    Ambulation/Gait Ambulation/Gait assistance: Min guard Gait Distance (Feet): 95 Feet Assistive device: Rolling walker (2 wheels) Gait Pattern/deviations: Step-through pattern, Decreased stride length Gait velocity: decreased   Pre-gait activities: Static stance weight shift and march with RW General Gait Details: Educated on safe AD use with RW. Cues for larger step length as tolerated, good foot clearance but very short stride for height. No overt LOB, somewhat rigid and guarded but not overtly abnormal post-op. Wife indicates near baseline mechanics. No buckling noted. Mild incisional pain reported only. No dizziness.  Stairs            Wheelchair Mobility    Modified Rankin (Stroke Patients Only)       Balance Overall balance assessment: Mild deficits observed, not formally tested, History of Falls  Pertinent Vitals/Pain Pain Assessment Pain Assessment: 0-10 Pain Score: 2  Pain Location: back Pain Descriptors / Indicators: Burning Pain Intervention(s): Limited activity within patient's tolerance, Monitored during  session, Repositioned    Home Living Family/patient expects to be discharged to:: Private residence Living Arrangements: Spouse/significant other Available Help at Discharge: Family;Available 24 hours/day Type of Home: House Home Access: Stairs to enter Entrance Stairs-Rails: None Entrance Stairs-Number of Steps: 1 (threshold)   Home Layout: Two level;Full bath on main level;Able to live on main level with bedroom/bathroom Home Equipment: Rolling Walker (2 wheels);Shower seat - built in;Toilet riser;Cane - single point      Prior Function Prior Level of Function : Independent/Modified Independent;Driving;History of Falls (last six months)             Mobility Comments: ind took extra time, in pain ADLs Comments: ind extra time     Hand Dominance   Dominant Hand:  (ambi)    Extremity/Trunk Assessment   Upper Extremity Assessment Upper Extremity Assessment: Defer to OT evaluation    Lower Extremity Assessment Lower Extremity Assessment: Generalized weakness       Communication   Communication: No difficulties  Cognition Arousal/Alertness: Awake/alert Behavior During Therapy: WFL for tasks assessed/performed Overall Cognitive Status: Within Functional Limits for tasks assessed                                          General Comments General comments (skin integrity, edema, etc.): Wife present during evaluation, supportive. Reviewed back precautions, sit for 45 min. RN notified.    Exercises     Assessment/Plan    PT Assessment Patient needs continued PT services  PT Problem List Decreased strength;Decreased range of motion;Decreased activity tolerance;Decreased balance;Decreased mobility;Decreased knowledge of use of DME;Decreased knowledge of precautions;Pain       PT Treatment Interventions DME instruction;Gait training;Stair training;Functional mobility training;Therapeutic activities;Therapeutic exercise;Balance training;Neuromuscular  re-education;Patient/family education    PT Goals (Current goals can be found in the Care Plan section)  Acute Rehab PT Goals Patient Stated Goal: Get back home PT Goal Formulation: With patient/family Time For Goal Achievement: 08/07/21 Potential to Achieve Goals: Good    Frequency Min 5X/week     Co-evaluation               AM-PAC PT "6 Clicks" Mobility  Outcome Measure Help needed turning from your back to your side while in a flat bed without using bedrails?: A Little Help needed moving from lying on your back to sitting on the side of a flat bed without using bedrails?: A Little Help needed moving to and from a bed to a chair (including a wheelchair)?: A Little Help needed standing up from a chair using your arms (e.g., wheelchair or bedside chair)?: A Little Help needed to walk in hospital room?: A Little Help needed climbing 3-5 steps with a railing? : A Lot 6 Click Score: 17    End of Session Equipment Utilized During Treatment: Gait belt Activity Tolerance: Patient tolerated treatment well Patient left: in chair;with call bell/phone within reach;with chair alarm set Nurse Communication: Mobility status PT Visit Diagnosis: Unsteadiness on feet (R26.81);Other abnormalities of gait and mobility (R26.89);Repeated falls (R29.6);Muscle weakness (generalized) (M62.81);History of falling (Z91.81);Difficulty in walking, not elsewhere classified (R26.2);Pain Pain - part of body:  (back)    Time: 1324-4010 PT Time Calculation (min) (ACUTE ONLY): 34 min   Charges:  PT Evaluation $PT Eval Low Complexity: 1 Low PT Treatments $Therapeutic Activity: 8-22 mins        Candie Mile, PT   Ellouise Newer 08/03/2021, 4:35 PM

## 2021-08-04 DIAGNOSIS — M48062 Spinal stenosis, lumbar region with neurogenic claudication: Secondary | ICD-10-CM | POA: Diagnosis not present

## 2021-08-04 DIAGNOSIS — I10 Essential (primary) hypertension: Secondary | ICD-10-CM | POA: Diagnosis not present

## 2021-08-04 DIAGNOSIS — E119 Type 2 diabetes mellitus without complications: Secondary | ICD-10-CM | POA: Diagnosis not present

## 2021-08-04 DIAGNOSIS — Z87891 Personal history of nicotine dependence: Secondary | ICD-10-CM | POA: Diagnosis not present

## 2021-08-04 DIAGNOSIS — I4891 Unspecified atrial fibrillation: Secondary | ICD-10-CM | POA: Diagnosis not present

## 2021-08-04 DIAGNOSIS — I083 Combined rheumatic disorders of mitral, aortic and tricuspid valves: Secondary | ICD-10-CM | POA: Diagnosis not present

## 2021-08-04 DIAGNOSIS — I2510 Atherosclerotic heart disease of native coronary artery without angina pectoris: Secondary | ICD-10-CM | POA: Diagnosis not present

## 2021-08-04 DIAGNOSIS — M4726 Other spondylosis with radiculopathy, lumbar region: Secondary | ICD-10-CM | POA: Diagnosis not present

## 2021-08-04 DIAGNOSIS — Z951 Presence of aortocoronary bypass graft: Secondary | ICD-10-CM | POA: Diagnosis not present

## 2021-08-04 LAB — GLUCOSE, CAPILLARY
Glucose-Capillary: 130 mg/dL — ABNORMAL HIGH (ref 70–99)
Glucose-Capillary: 156 mg/dL — ABNORMAL HIGH (ref 70–99)

## 2021-08-04 MED ORDER — CYCLOBENZAPRINE HCL 5 MG PO TABS: 5.0000 mg | ORAL_TABLET | Freq: Three times a day (TID) | ORAL | Status: DC | PRN

## 2021-08-04 MED ORDER — OXYCODONE-ACETAMINOPHEN 5-325 MG PO TABS: 1.0000 | ORAL_TABLET | ORAL | 0 refills | Status: DC | PRN

## 2021-08-04 MED ORDER — DOCUSATE SODIUM 100 MG PO CAPS: 100.0000 mg | ORAL_CAPSULE | Freq: Two times a day (BID) | ORAL | 0 refills | Status: DC

## 2021-08-04 MED ORDER — OXYCODONE-ACETAMINOPHEN 5-325 MG PO TABS: 1.0000 | ORAL_TABLET | ORAL | Status: DC | PRN

## 2021-08-04 MED ORDER — CYCLOBENZAPRINE HCL 5 MG PO TABS: 5.0000 mg | ORAL_TABLET | Freq: Three times a day (TID) | ORAL | 0 refills | Status: DC | PRN

## 2021-08-04 NOTE — Plan of Care (Signed)
No acute events overnight.    Problem: Education: Goal: Knowledge of General Education information will improve Description: Including pain rating scale, medication(s)/side effects and non-pharmacologic comfort measures Outcome: Progressing   Problem: Health Behavior/Discharge Planning: Goal: Ability to manage health-related needs will improve Outcome: Progressing   Problem: Clinical Measurements: Goal: Ability to maintain clinical measurements within normal limits will improve Outcome: Progressing Goal: Will remain free from infection Outcome: Progressing Goal: Diagnostic test results will improve Outcome: Progressing Goal: Respiratory complications will improve Outcome: Progressing Goal: Cardiovascular complication will be avoided Outcome: Progressing   Problem: Activity: Goal: Risk for activity intolerance will decrease Outcome: Progressing   Problem: Nutrition: Goal: Adequate nutrition will be maintained Outcome: Progressing   Problem: Coping: Goal: Level of anxiety will decrease Outcome: Progressing   Problem: Elimination: Goal: Will not experience complications related to bowel motility Outcome: Progressing Goal: Will not experience complications related to urinary retention Outcome: Progressing   Problem: Pain Managment: Goal: General experience of comfort will improve Outcome: Progressing   Problem: Safety: Goal: Ability to remain free from injury will improve Outcome: Progressing   Problem: Skin Integrity: Goal: Risk for impaired skin integrity will decrease Outcome: Progressing   Problem: Education: Goal: Ability to describe self-care measures that may prevent or decrease complications (Diabetes Survival Skills Education) will improve Outcome: Progressing Goal: Individualized Educational Video(s) Outcome: Progressing   Problem: Coping: Goal: Ability to adjust to condition or change in health will improve Outcome: Progressing   Problem: Fluid  Volume: Goal: Ability to maintain a balanced intake and output will improve Outcome: Progressing   Problem: Health Behavior/Discharge Planning: Goal: Ability to identify and utilize available resources and services will improve Outcome: Progressing Goal: Ability to manage health-related needs will improve Outcome: Progressing   Problem: Metabolic: Goal: Ability to maintain appropriate glucose levels will improve Outcome: Progressing   Problem: Nutritional: Goal: Maintenance of adequate nutrition will improve Outcome: Progressing Goal: Progress toward achieving an optimal weight will improve Outcome: Progressing   Problem: Skin Integrity: Goal: Risk for impaired skin integrity will decrease Outcome: Progressing   Problem: Tissue Perfusion: Goal: Adequacy of tissue perfusion will improve Outcome: Progressing   

## 2021-08-04 NOTE — Progress Notes (Signed)
RN removed the hemovac per order, gauze with transparent dressing was applied. Pt has DC order, discharge nurse is currently working on his discharge.

## 2021-08-04 NOTE — Progress Notes (Signed)
Physical Therapy Treatment Patient Details Name: Bryce Maldonado MRN: [id number] DOB: [birth year] Today's Date: 08/04/2021   History of Present Illness 81 y.o. male underwent Bilateral L2-3, L3-4 and L4-5 laminotomy/foraminotomies on 08/03/21. Hx of afib, BPH, anemia, OA, DMII.    PT Comments    Patient is progressing well towards physical therapy goals, ambulating up to 150 feet with a RW at a supervision level. Reviewed stair navigation using RW in posterior direction, found to be safest approach due to LE weakness. Pt confident with abilities and will have sufficient care at d/c from wife. Adequate for d/c from PT standpoint.     Recommendations for follow up therapy are one component of a multi-disciplinary discharge planning process, led by the attending physician.  Recommendations may be updated based on patient status, additional functional criteria and insurance authorization.  Follow Up Recommendations  Follow physician's recommendations for discharge plan and follow up therapies (Consider OPPT for LE strengthening once cleared by surgeon.)     Assistance Recommended at Discharge Intermittent Supervision/Assistance  Patient can return home with the following Assistance with cooking/housework;Assist for transportation;A little help with bathing/dressing/bathroom;Help with stairs or ramp for entrance   Equipment Recommendations  None recommended by PT    Recommendations for Other Services       Precautions / Restrictions Precautions Precautions: Back Precaution Booklet Issued: Yes (comment) Precaution Comments: pt able to recall 3/3 precautions w/o cues; no brace needed per orders Restrictions Weight Bearing Restrictions: No     Mobility  Bed Mobility Overal bed mobility: Modified Independent Bed Mobility: Rolling, Sidelying to Sit Rolling: Modified independent (Device/Increase time) Sidelying to sit: Modified independent (Device/Increase time)       General bed  mobility comments: Extra time but safe with log roll technique rising out of bed.    Transfers Overall transfer level: Modified independent Equipment used: Rolling walker (2 wheels) Transfers: Sit to/from Stand Sit to Stand: Modified independent (Device/Increase time)           General transfer comment: No assist required. steady upon standing with RW for support, good control to sit.    Ambulation/Gait Ambulation/Gait assistance: Supervision Gait Distance (Feet): 150 Feet Assistive device: Rolling walker (2 wheels) Gait Pattern/deviations: Step-through pattern, Decreased stride length Gait velocity: decreased     General Gait Details: Cues for larger stride, good foot clearance, improved gait speed noted today. No buckling noted. Supervision for safety. Cues to keep RW within proximity due to LE weakness as he pushed it away to answer phone in room while standing.   Stairs Stairs: Yes Stairs assistance: Min guard Stair Management: No rails, Forwards, Backwards Number of Stairs: 1 (x6) General stair comments: Practiced forward with some notable instability using RW on right as a brace to push from. Weakness resulting in close guard for safety. Practiced backwards approach and patient performed very safely with ability to use more arm support. Verb understanding for step at home.   Wheelchair Mobility    Modified Rankin (Stroke Patients Only)       Balance Overall balance assessment: Mild deficits observed, not formally tested                                          Cognition Arousal/Alertness: Awake/alert Behavior During Therapy: WFL for tasks assessed/performed Overall Cognitive Status: Within Functional Limits for tasks assessed  Exercises      General Comments General comments (skin integrity, edema, etc.): Reviewed precautions, recommendations for frequent short walks in house with  RW.      Pertinent Vitals/Pain Pain Assessment Pain Assessment: Faces Faces Pain Scale: Hurts a little bit Pain Location: back Pain Descriptors / Indicators: Sore Pain Intervention(s): Monitored during session, Repositioned    Home Living Family/patient expects to be discharged to:: Private residence Living Arrangements: Spouse/significant other Available Help at Discharge: Family;Available 24 hours/day Type of Home: House Home Access: Stairs to enter Entrance Stairs-Rails: None Entrance Stairs-Number of Steps: 1   Home Layout: Two level;Full bath on main level;Able to live on main level with bedroom/bathroom Home Equipment: Rolling Walker (2 wheels);Shower seat - built in;Toilet riser;Cane - single point;Hand held shower head;Adaptive equipment      Prior Function            PT Goals (current goals can now be found in the care plan section) Acute Rehab PT Goals Patient Stated Goal: Go home asap PT Goal Formulation: With patient/family Time For Goal Achievement: 08/07/21 Potential to Achieve Goals: Good Progress towards PT goals: Progressing toward goals    Frequency    Min 5X/week      PT Plan Current plan remains appropriate    Co-evaluation              AM-PAC PT "6 Clicks" Mobility   Outcome Measure  Help needed turning from your back to your side while in a flat bed without using bedrails?: None Help needed moving from lying on your back to sitting on the side of a flat bed without using bedrails?: None Help needed moving to and from a bed to a chair (including a wheelchair)?: None Help needed standing up from a chair using your arms (e.g., wheelchair or bedside chair)?: None Help needed to walk in hospital room?: A Little Help needed climbing 3-5 steps with a railing? : A Little 6 Click Score: 22    End of Session Equipment Utilized During Treatment: Gait belt Activity Tolerance: Patient tolerated treatment well Patient left: in chair;with  call bell/phone within reach;with chair alarm set Nurse Communication: Mobility status PT Visit Diagnosis: Unsteadiness on feet (R26.81);Other abnormalities of gait and mobility (R26.89);Repeated falls (R29.6);Muscle weakness (generalized) (M62.81);History of falling (Z91.81);Difficulty in walking, not elsewhere classified (R26.2);Pain Pain - part of body:  (back)     Time: 2620-3559 PT Time Calculation (min) (ACUTE ONLY): 15 min  Charges:  $Gait Training: 8-22 mins                     Candie Mile, PT    Ellouise Newer 08/04/2021, 8:56 AM

## 2021-08-04 NOTE — Discharge Summary (Signed)
Physician Discharge Summary  Patient ID: Bryce Maldonado MRN: [id number] DOB/AGE: [birth year] 81 y.o.  Admit date: 08/03/2021 Discharge date: 08/04/2021  Admission Diagnoses: Lumbar spinal stenosis, lumbago, lumbar radiculopathy, neurogenic claudication  Discharge Diagnoses: The same Principal Problem:   Spinal stenosis of lumbar region with neurogenic claudication   Discharged Condition: good  Hospital Course: I performed bilateral L2-3, L3-4 and L4-5 laminotomy/foraminotomies on the patient on 08/03/2021.  The surgery went well.  The patient's postoperative course was unremarkable.  On postoperative day #1 he looked and felt well.  He requested discharge home.  He was given written and verbal discharge instructions.  All his questions were answered.  He was instructed to resume his Eliquis on 08/07/2021.  Consults: PT, OT, care management Significant Diagnostic Studies: None Treatments: Bilateral L2-3, L3-4 and L4-5 laminotomy/foraminotomies Discharge Exam: Blood pressure 121/62, pulse 63, temperature 98.3 F (36.8 C), resp. rate 14, height 6' (1.829 m), weight 87.9 kg, SpO2 97 %. The patient is alert and pleasant.  His strength is normal.  His dressing is clean and dry.  Disposition: Home  Discharge Instructions     Call MD for:  difficulty breathing, headache or visual disturbances   Complete by: As directed    Call MD for:  extreme fatigue   Complete by: As directed    Call MD for:  hives   Complete by: As directed    Call MD for:  persistant dizziness or light-headedness   Complete by: As directed    Call MD for:  persistant nausea and vomiting   Complete by: As directed    Call MD for:  redness, tenderness, or signs of infection (pain, swelling, redness, odor or green/yellow discharge around incision site)   Complete by: As directed    Call MD for:  severe uncontrolled pain   Complete by: As directed    Call MD for:  temperature >100.4   Complete by: As directed    Diet  - low sodium heart healthy   Complete by: As directed    Discharge instructions   Complete by: As directed    Call [phone] for a followup appointment. Take a stool softener while you are using pain medications.   Driving Restrictions   Complete by: As directed    Do not drive for 2 weeks.   Increase activity slowly   Complete by: As directed    Lifting restrictions   Complete by: As directed    Do not lift more than 5 pounds. No excessive bending or twisting.   May shower / Bathe   Complete by: As directed    Remove the dressing for 3 days after surgery.  You may shower, but leave the incision alone.   Remove dressing in 48 hours   Complete by: As directed       Allergies as of 08/04/2021       Reactions   Naproxen Sodium Rash        Medication List     TAKE these medications    allopurinol 300 MG tablet Commonly known as: ZYLOPRIM TAKE 1 TABLET(300 MG) BY MOUTH DAILY   apixaban 5 MG Tabs tablet Commonly known as: Eliquis Take 1 tablet (5 mg total) by mouth 2 (two) times daily.   CENTRUM PO Take 1 tablet by mouth daily.   cyclobenzaprine 5 MG tablet Commonly known as: FLEXERIL Take 1 tablet (5 mg total) by mouth 3 (three) times daily as needed for muscle spasms.   docusate sodium 100  MG capsule Commonly known as: COLACE Take 1 capsule (100 mg total) by mouth 2 (two) times daily.   ezetimibe 10 MG tablet Commonly known as: ZETIA TAKE 1 TABLET(10 MG) BY MOUTH AT BEDTIME   hydroxypropyl methylcellulose / hypromellose 2.5 % ophthalmic solution Commonly known as: ISOPTO TEARS / GONIOVISC Place 1 drop into both eyes in the morning and at bedtime.   Jardiance 25 MG Tabs tablet Generic drug: empagliflozin TAKE 1 TABLET(25 MG) BY MOUTH DAILY   losartan 25 MG tablet Commonly known as: COZAAR TAKE 1 TABLET(25 MG) BY MOUTH DAILY   melatonin 5 MG Tabs Take 5 mg by mouth at bedtime.   metFORMIN 500 MG tablet Commonly known as: GLUCOPHAGE TAKE 2  TABLETS(1000 MG) BY MOUTH TWICE DAILY WITH A MEAL   OVER THE COUNTER MEDICATION Take 3 capsules by mouth daily. Balance of Nature Fruits [employer] Veggies   oxyCODONE-acetaminophen 5-325 MG tablet Commonly known as: PERCOCET/ROXICET Take 1-2 tablets by mouth every 4 (four) hours as needed for moderate pain.   rosuvastatin 10 MG tablet Commonly known as: CRESTOR TAKE 1 TABLET(10 MG) BY MOUTH AT BEDTIME   sodium chloride 0.65 % Soln nasal spray Commonly known as: OCEAN Place 1 spray into both nostrils at bedtime.   venlafaxine XR 37.5 MG 24 hr capsule Commonly known as: EFFEXOR-XR TAKE 1 CAPSULE(37.5 MG) BY MOUTH DAILY WITH BREAKFAST         Signed: Ophelia Charter 08/04/2021, 7:53 AM

## 2021-08-04 NOTE — Evaluation (Signed)
Occupational Therapy Evaluation/Discharge Patient Details Name: Bryce Maldonado MRN: [id number] DOB: 23-Jan-1941 Today's Date: 08/04/2021   History of Present Illness 81 y.o. male underwent Bilateral L2-3, L3-4 and L4-5 laminotomy/foraminotomies on 08/03/21. Hx of afib, BPH, anemia, OA, DMII.   Clinical Impression   PTA, pt lives with spouse, typically Independent in all daily tasks without need for AD. Pt presents now with improved back pain since sx and eager to DC home today. Educated re: spinal precautions for ADLs including compensatory strategies, body mechanics for IADLs, avoiding heavy lifting, and helpful DME. Pt able to return demo all ADLs/mobility assessed without physical assistance and without safety concerns. Pt reports wife will be available to assist with tasks and provide intermittent reminders for back precautions at home. No further skilled OT services needed at acute level or on DC.      Recommendations for follow up therapy are one component of a multi-disciplinary discharge planning process, led by the attending physician.  Recommendations may be updated based on patient status, additional functional criteria and insurance authorization.   Follow Up Recommendations  No OT follow up    Assistance Recommended at Discharge PRN  Patient can return home with the following Assist for transportation;Assistance with cooking/housework    Functional Status Assessment  Patient has had a recent decline in their functional status and demonstrates the ability to make significant improvements in function in a reasonable and predictable amount of time.  Equipment Recommendations  None recommended by OT    Recommendations for Other Services       Precautions / Restrictions Precautions Precautions: Back Precaution Booklet Issued: Yes (comment) Precaution Comments: pt able to recall 2/3 precautions w/o cues; no brace needed per orders Restrictions Weight Bearing Restrictions: No       Mobility Bed Mobility Overal bed mobility: Modified Independent Bed Mobility: Rolling, Sidelying to Sit, Sit to Sidelying Rolling: Modified independent (Device/Increase time) Sidelying to sit: Modified independent (Device/Increase time)     Sit to sidelying: Modified independent (Device/Increase time) General bed mobility comments: able to return demo log rolling without cues    Transfers Overall transfer level: Modified independent Equipment used: Rolling walker (2 wheels) Transfers: Sit to/from Stand Sit to Stand: Modified independent (Device/Increase time)                  Balance Overall balance assessment: No apparent balance deficits (not formally assessed)                                         ADL either performed or assessed with clinical judgement   ADL Overall ADL's : Needs assistance/impaired Eating/Feeding: Independent   Grooming: Modified independent;Standing;Oral care Grooming Details (indicate cue type and reason): able to implement back precautions after initial education. encouraged pt to leave cup at sink as reminder to avoid bending Upper Body Bathing: Independent   Lower Body Bathing: Set up;Sit to/from stand   Upper Body Dressing : Independent;Sitting   Lower Body Dressing: Set up;Sitting/lateral leans;Sit to/from stand Lower Body Dressing Details (indicate cue type and reason): reports typically wearing sketchers that he can slide his foot into. difficulty crossing LEs to reach, reports wife can assist as needed Toilet Transfer: Set up;Ambulation;Rolling walker (2 wheels)   Toileting- Clothing Manipulation and Hygiene: Set up;Sitting/lateral lean;Sit to/from stand       Functional mobility during ADLs: Set up;Rolling walker (2 wheels) General ADL Comments:  Pt with much improved back pain since sx. Educated on back precautions for ADLs, body mechanics for IADLs and to avoid heavy lifting. Discussed fall prevention,  DME safety and use at home. Feel RW use very temporary for pt and pt agrees     Vision Baseline Vision/History: 1 Wears glasses Ability to See in Adequate Light: 0 Adequate Patient Visual Report: No change from baseline Vision Assessment?: No apparent visual deficits     Perception     Praxis      Pertinent Vitals/Pain Pain Assessment Pain Assessment: Faces Faces Pain Scale: Hurts a little bit Pain Location: back Pain Descriptors / Indicators: Sore Pain Intervention(s): Monitored during session, Premedicated before session     Hand Dominance Right   Extremity/Trunk Assessment Upper Extremity Assessment Upper Extremity Assessment: Overall WFL for tasks assessed   Lower Extremity Assessment Lower Extremity Assessment: Defer to PT evaluation   Cervical / Trunk Assessment Cervical / Trunk Assessment: Back Surgery   Communication Communication Communication: No difficulties   Cognition Arousal/Alertness: Awake/alert Behavior During Therapy: WFL for tasks assessed/performed Overall Cognitive Status: Within Functional Limits for tasks assessed                                       General Comments       Exercises     Shoulder Instructions      Home Living Family/patient expects to be discharged to:: Private residence Living Arrangements: Spouse/significant other Available Help at Discharge: Family;Available 24 hours/day Type of Home: House Home Access: Stairs to enter [employer] of Steps: 1 Entrance Stairs-Rails: None Home Layout: Two level;Full bath on main level;Able to live on main level with bedroom/bathroom     Bathroom Shower/Tub: Occupational psychologist: Standard     Home Equipment: Conservation officer, nature (2 wheels);Shower seat - built in;Toilet riser;Cane - single point;Hand held shower head;Adaptive equipment Adaptive Equipment: Reacher        Prior Functioning/Environment Prior Level of Function :  Independent/Modified Independent;Driving;History of Falls (last six months)             Mobility Comments: no use of AD, limited by pain ADLs Comments: Independent with ADLs, driving, some iADLs. limited by pain        OT Problem List:        OT Treatment/Interventions:      OT Goals(Current goals can be found in the care plan section) Acute Rehab OT Goals Patient Stated Goal: go home today OT Goal Formulation: All assessment and education complete, DC therapy  OT Frequency:      Co-evaluation              AM-PAC OT "6 Clicks" Daily Activity     Outcome Measure Help from another person eating meals?: None Help from another person taking care of personal grooming?: None Help from another person toileting, which includes using toliet, bedpan, or urinal?: None Help from another person bathing (including washing, rinsing, drying)?: None Help from another person to put on and taking off regular upper body clothing?: None Help from another person to put on and taking off regular lower body clothing?: A Little 6 Click Score: 23   End of Session Equipment Utilized During Treatment: Rolling walker (2 wheels)  Activity Tolerance: Patient tolerated treatment well Patient left: in bed;with call bell/phone within reach  OT Visit Diagnosis: Pain Pain - part of body:  (back)  Time: 3979-5369 OT Time Calculation (min): 19 min Charges:  OT General Charges $OT Visit: 1 Visit OT Evaluation $OT Eval Low Complexity: 1 Low  Malachy Chamber, OTR/L Acute Rehab Services Office: [phone]   Layla Maw 08/04/2021, 7:47 AM

## 2021-09-11 ENCOUNTER — Other Ambulatory Visit: Payer: Self-pay | Admitting: Internal Medicine

## 2021-11-07 DIAGNOSIS — H0102B Squamous blepharitis left eye, upper and lower eyelids: Secondary | ICD-10-CM | POA: Diagnosis not present

## 2021-11-07 DIAGNOSIS — H3562 Retinal hemorrhage, left eye: Secondary | ICD-10-CM | POA: Diagnosis not present

## 2021-11-07 DIAGNOSIS — H40013 Open angle with borderline findings, low risk, bilateral: Secondary | ICD-10-CM | POA: Diagnosis not present

## 2021-11-07 DIAGNOSIS — H0102A Squamous blepharitis right eye, upper and lower eyelids: Secondary | ICD-10-CM | POA: Diagnosis not present

## 2021-11-07 DIAGNOSIS — H04123 Dry eye syndrome of bilateral lacrimal glands: Secondary | ICD-10-CM | POA: Diagnosis not present

## 2021-11-07 DIAGNOSIS — Z961 Presence of intraocular lens: Secondary | ICD-10-CM | POA: Diagnosis not present

## 2021-11-07 LAB — HM DIABETES EYE EXAM

## 2021-11-17 ENCOUNTER — Other Ambulatory Visit: Payer: Self-pay | Admitting: Internal Medicine

## 2021-11-24 DIAGNOSIS — H3563 Retinal hemorrhage, bilateral: Secondary | ICD-10-CM | POA: Diagnosis not present

## 2021-11-24 DIAGNOSIS — H35033 Hypertensive retinopathy, bilateral: Secondary | ICD-10-CM | POA: Diagnosis not present

## 2021-11-24 DIAGNOSIS — H31091 Other chorioretinal scars, right eye: Secondary | ICD-10-CM | POA: Diagnosis not present

## 2021-11-24 DIAGNOSIS — H43811 Vitreous degeneration, right eye: Secondary | ICD-10-CM | POA: Diagnosis not present

## 2021-11-24 DIAGNOSIS — E113293 Type 2 diabetes mellitus with mild nonproliferative diabetic retinopathy without macular edema, bilateral: Secondary | ICD-10-CM | POA: Diagnosis not present

## 2021-11-27 ENCOUNTER — Other Ambulatory Visit: Payer: Self-pay | Admitting: Internal Medicine

## 2021-12-02 ENCOUNTER — Ambulatory Visit: Payer: Medicare PPO

## 2021-12-12 ENCOUNTER — Ambulatory Visit (INDEPENDENT_AMBULATORY_CARE_PROVIDER_SITE_OTHER): Payer: Medicare PPO

## 2021-12-12 DIAGNOSIS — Z0000 Encounter for general adult medical examination without abnormal findings: Secondary | ICD-10-CM | POA: Diagnosis not present

## 2021-12-12 NOTE — Progress Notes (Signed)
Subjective:   Bryce Maldonado is a 81 y.o. male who presents for Medicare Annual/Subsequent preventive examination.  I connected with Bryce Maldonado today by telephone and verified that I am speaking with the correct person using two identifiers. I discussed the limitations, risks, security and privacy concerns of performing an evaluation and management service by telephone and the availability of in person appointments. I also discussed with the patient that there may be a patient responsible charge related to this service. The patient expressed understanding and agreed to proceed. Location patient: home Location provider: Pietro Maldonado Persons participating in the visit: Bryce Maldonado and Bryce Maldonado, Bryce Maldonado.  Time Spent with patient on telephone encounter: 8 mins  Review of Systems    No ROS. Medicare Wellness Telephone Visit. Additional risk factors are reflected in social history. Cardiac Risk Factors include: advanced age (>6mn, >>10women);diabetes mellitus;dyslipidemia;male gender;hypertension     Objective:    There were no vitals filed for this visit. There is no height or weight on file to calculate BMI.     12/12/2021   10:46 AM 08/03/2021    3:00 PM 07/26/2021    1:18 PM 12/01/2020    9:07 AM 12/20/2018    9:56 AM 03/29/2017   10:18 AM 02/02/2015    9:58 AM  Advanced Directives  Does Patient Have a Medical Advance Directive? Yes Yes Yes Yes Yes Yes No  Type of AParamedicof ASteinauerLiving will HHaz[place]Living will [place]Living will Living will;Healthcare Power of [place] will   Does patient want to make changes to medical advance directive? No - Patient declined No - Patient declined No - Patient declined No - Patient declined     Copy of HSanta Maldonado Chart? No - copy requested No - copy requested No - copy requested No - copy requested  No - copy  requested   Would patient like information on creating a medical advance directive?       No - patient declined information    Current Medications (verified) Outpatient Encounter Medications as of 12/12/2021  Medication Sig   allopurinol (ZYLOPRIM) 300 MG tablet TAKE 1 TABLET(300 MG) BY MOUTH DAILY   apixaban (ELIQUIS) 5 MG TABS tablet Take 1 tablet (5 mg total) by mouth 2 (two) times daily.   ezetimibe (ZETIA) 10 MG tablet TAKE 1 TABLET(10 MG) BY MOUTH AT BEDTIME   JARDIANCE 25 MG TABS tablet TAKE 1 TABLET(25 MG) BY MOUTH DAILY   losartan (COZAAR) 25 MG tablet TAKE 1 TABLET(25 MG) BY MOUTH DAILY   Melatonin 5 MG TABS Take 5 mg by mouth at bedtime.   metFORMIN (GLUCOPHAGE) 500 MG tablet TAKE 2 TABLETS(1000 MG) BY MOUTH TWICE DAILY WITH A MEAL   Multiple Vitamins-Minerals (CENTRUM PO) Take 1 tablet by mouth daily.   OVER THE COUNTER MEDICATION Take 3 capsules by mouth daily. Balance of Nature Fruits and Veggies   rosuvastatin (CRESTOR) 10 MG tablet TAKE 1 TABLET(10 MG) BY MOUTH AT BEDTIME   sodium chloride (OCEAN) 0.65 % SOLN nasal spray Place 1 spray into both nostrils at bedtime.   venlafaxine XR (EFFEXOR-XR) 37.5 MG 24 hr capsule TAKE 1 CAPSULE(37.5 MG) BY MOUTH DAILY WITH BREAKFAST   [DISCONTINUED] cyclobenzaprine (FLEXERIL) 5 MG tablet Take 1 tablet (5 mg total) by mouth 3 (three) times daily as needed for muscle spasms.   [DISCONTINUED] docusate sodium (COLACE) 100 MG capsule Take 1 capsule (100 mg  total) by mouth 2 (two) times daily.   [DISCONTINUED] hydroxypropyl methylcellulose / hypromellose (ISOPTO TEARS / GONIOVISC) 2.5 % ophthalmic solution Place 1 drop into both eyes in the morning and at bedtime.   [DISCONTINUED] oxyCODONE-acetaminophen (PERCOCET/ROXICET) 5-325 MG tablet Take 1-2 tablets by mouth every 4 (four) hours as needed for moderate pain.   No facility-administered encounter medications on file as of 12/12/2021.    Allergies (verified) Naproxen sodium    History: Past Medical History:  Diagnosis Date   Anemia    Atrial fibrillation ([place])    pt has had an ablation   BPH (benign prostatic hyperplasia)    Elevated PSA   Childhood asthma    Coronary artery disease    post CABG x4 -- in 1995 -- due to three-vessel coronary artery disease   Diverticular disease    Dyslipidemia    First degree AV block    Gout    Hypertension    Osteoarthritis    "everywhere" (12/04/2014)   Prostate cancer (Bryce Maldonado) 01/30/2010   Dr Bryce Maldonado   Spinal stenosis 01/30/2002   L4 radiculopathy, herniated nucleus pulposus L4-5   Tinnitus    Type II diabetes mellitus (HCC)    Typical atrial flutter (HCC)    s/p ablation   Past Surgical History:  Procedure Laterality Date   APPENDECTOMY  1943   ATRIAL FLUTTER ABLATION  12/04/2014   BACK SURGERY     CARDIAC CATHETERIZATION  02/10/2010   Est. EF of 60-65% -- Severe three-vessel obstructive atherosclerotic coronary artery disease -- All grafts were patent including left internal mammary artery graft to left anterior descending coronary artery, saphenous vein graft to the diagonal, saphenous vein graft to obtuse marginal vessel, and saphenous vein graft to the posterior descending coronary artery -- Normal left ventricular function    CATARACT EXTRACTION Bilateral 2023   COLONOSCOPY     negative X3; Dr Bryce Maldonado   CORONARY ARTERY BYPASS GRAFT  10/21/1993   x4 -- using the LIM artery graft to the LAD artery, with saphenous vein grafts to the diagonal branch of the LAD, OM branch the left circumflex coronary artery, and the posterior descending branch of the RCA -- Est. EF of 65%-- Surgeon: Bryce Maldonado, M.D.               ELECTROPHYSIOLOGIC STUDY N/A 10/01/2014   CTI ablation by Dr Bryce Maldonado   ELECTROPHYSIOLOGIC STUDY N/A 12/04/2014   repeat CTI ablation by Dr Bryce Maldonado   FLEXIBLE SIGMOIDOSCOPY  01/31/2003    external hemorrhoids   JOINT REPLACEMENT     LUMBAR LAMINECTOMY/DECOMPRESSION MICRODISCECTOMY Bilateral  08/03/2021   Procedure: Lumbar two-three, Lumbar three-four, Lumbar four-five BILATERAL LAMINOTOMY/FORAMINOTOMY;  Surgeon: Bryce Pies, MD;  Location: [facility];  Service: Neurosurgery;  Laterality: Bilateral;   MICRODISCECTOMY LUMBAR  01/30/2002   L4-5, Dr. Arnoldo Maldonado   PROSTATE BIOPSY  10/30/2008   "had radiation tx for prostate cancer"   SALIVARY STONE REMOVAL  01/31/1999   TOTAL HIP ARTHROPLASTY Right 11/30/2008   TOTAL HIP ARTHROPLASTY Left 03/02/2010   Dr Alvan Dame   Family History  Problem Relation Age of Onset   Diabetes Father    Hypertension Father    Lung cancer Father        smoker   Heart failure Mother 59   Hyperlipidemia Sister    Coronary artery disease Sister    Heart attack Paternal Aunt        X 2; both > 65   Colon cancer Neg Hx  Stroke Neg Hx    Social History   Socioeconomic History   Marital status: Married    Spouse name: Not on file   Number of children: 2   Years of education: 16   Highest education level: Not on file  Occupational History   Occupation: retired  Tobacco Use   Smoking status: Former    Packs/day: 2.50    Years: 15.00    Total pack years: 37.50    Types: Cigarettes    Quit date: 01/30/1970    Years since quitting: 51.9   Smokeless tobacco: Never   Tobacco comments:    smoked 1959-1972  Vaping Use   Vaping Use: Never used  Substance and Sexual Activity   Alcohol use: Not Currently    Alcohol/week: 2.0 standard drinks of alcohol    Types: 1 Cans of beer, 1 Standard drinks or equivalent per week    Comment: maybe a beer or 1 liquor drink once every 2 weeks   Drug use: No   Sexual activity: Not Currently  Other Topics Concern   Not on file  Social History Narrative   Pt lives in [place] with spouse. 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  Enjoys reading and walking.   Left handed   [place]   Social Determinants of Health   Financial Resource Strain: Low Risk  (12/12/2021)   Overall Financial Resource Strain (CARDIA)     Difficulty of Paying Living Expenses: Not hard at all  Food Insecurity: No Food Insecurity (12/12/2021)   Hunger Vital Sign    Worried About Running Out of Food in the Last Year: Never true    Ran Out of Food in the Last Year: Never true  Transportation Needs: No Transportation Needs (12/12/2021)   PRAPARE - Hydrologist (Medical): No    Lack of Transportation (Non-Medical): No  Physical Activity: Sufficiently Active (12/12/2021)   Exercise Vital Sign    Days of Exercise per Week: 7 days    Minutes of Exercise per Session: 30 min  Stress: No Stress Concern Present (12/12/2021)   [place]    Feeling of Stress : Not at all  Social Connections: Moderately Isolated (12/12/2021)   Social Connection and Isolation Panel [NHANES]    Frequency of Communication with Friends and Family: More than three times a week    Frequency of Social Gatherings with Friends and Family: More than three times a week    Attends Religious Services: Never    Marine scientist or Organizations: No    Attends Music therapist: Never    Marital Status: Married    Tobacco Counseling Counseling given: Not Answered Tobacco comments: smoked 1959-1972   Clinical Intake:  Pre-visit preparation completed: Yes  Pain : No/denies pain     Nutritional Status: BMI 25 -29 Overweight Nutritional Risks: None Diabetes: Yes CBG done?: No Did pt. bring in CBG monitor from home?: No Nutrition Risk Assessment:  Has the patient had any N/V/D within the last 2 months?  No  Does the patient have any non-healing wounds?  No  Has the patient had any unintentional weight loss or weight gain?  No   Diabetes:  Is the patient diabetic?  Yes  If diabetic, was a CBG obtained today?  No  Did the patient bring in their glucometer from home?  No  How often do you monitor your CBG's? "Only in the office when I  see  Dr.Burns."   Financial Strains and Diabetes Management:  Are you having any financial strains with the device, your supplies or your medication? No .  Does the patient want to be seen by Chronic Care Management for management of their diabetes?  No  Would the patient like to be referred to a Nutritionist or for Diabetic Management?  No   Diabetic Exams:  Diabetic Eye Exam: Completed 2023, eye exam report requested Diabetic Foot Exam: Overdue, Pt has been advised about the importance in completing this exam. Pt is scheduled for diabetic foot exam on 12/20/21 for his physical.   How often do you need to have someone help you when you read instructions, pamphlets, or other written materials from your doctor or pharmacy?: 1 - Never What is the last grade level you completed in school?: Masters degree  Interpreter Needed?: No  Information entered by :: Bryce Maldonado, CMA   Activities of Daily Living    12/12/2021   10:47 AM 08/03/2021    3:00 PM  In your present state of health, do you have any difficulty performing the following activities:  Hearing? 0 0  Vision? 0 0  Difficulty concentrating or making decisions? 0 0  Walking or climbing stairs? 0 1  Dressing or bathing? 0 0  Doing errands, shopping? 0 0  Preparing Food and eating ? N   Using the Toilet? N   In the past six months, have you accidently leaked urine? N   Do you have problems with loss of bowel control? N   Managing your Medications? N   Managing your Finances? N   Housekeeping or managing your Housekeeping? N     Patient Care Team: Binnie Rail, MD as PCP - General (Internal Medicine) [place], Peter M, MD as PCP - Cardiology (Cardiology) Alda Berthold, DO as Consulting Physician (Neurology)  Indicate any recent Medical Services you may have received from other than [facility] providers in the past year (date may be approximate).     Assessment:   This is a routine wellness examination for  Bryce Maldonado.  Hearing/Vision screen Patient denied any hearing difficulty. No hearing aids. Patient does wear corrective lenses.  Dietary issues and exercise activities discussed: Current Exercise Habits: Home exercise routine, Type of exercise: walking, Time (Minutes): 30, Frequency (Times/Week): 7, Weekly Exercise (Minutes/Week): 210, Intensity: Mild, Exercise limited by: None identified   Goals Addressed             This Visit's Progress    Patient Stated       I would like to continue my current routine and make it to my next birthday.        Depression Screen    12/12/2021   10:45 AM 06/01/2021    9:39 AM 12/01/2020    9:05 AM 04/26/2020    9:18 AM 04/23/2019    9:32 AM 04/03/2018    8:56 AM 10/02/2017    8:55 AM  PHQ 2/9 Scores  PHQ - 2 Score 0 0 0 0 0 0 0  PHQ- 9 Score    0   0    Fall Risk    12/12/2021   10:46 AM 06/01/2021    9:39 AM 12/01/2020    9:07 AM 04/26/2020    8:49 AM 04/23/2019    9:32 AM  Fall Risk   Falls in the past year? 1 1 0 0 0  Number falls in past yr: 1 1 0 0 0  Injury with  Fall? 0 0 0 0 0  Risk for fall due to : History of fall(s) History of fall(s) No Fall Risks No Fall Risks   Follow up Falls evaluation completed Falls evaluation completed Falls evaluation completed Falls evaluation completed     [place]:  Any stairs in or around the home? Yes  If so, are there any without handrails? No  Home free of loose throw rugs in walkways, pet beds, electrical cords, etc? Yes  Adequate lighting in your home to reduce risk of falls? Yes   ASSISTIVE DEVICES UTILIZED TO PREVENT FALLS:  Life alert? No  Use of a cane, walker or w/c? No  Grab bars in the bathroom? Yes  Shower chair or bench in shower? Yes  Elevated toilet seat or a handicapped toilet? Yes   TIMED UP AND GO:  Was the test performed? No .  Length of time to ambulate 10 feet: N/A sec.   Patient stated that he has no issues with gait or balance;  does not use any assistive devices.  Cognitive Function:  Patient is cogitatively intact.      12/12/2021   10:48 AM  6CIT Screen  What Year? 0 points  What month? 0 points  What time? 0 points  Count back from 20 0 points  Months in reverse 0 points  Repeat phrase 0 points  Total Score 0 points    Immunizations Immunization History  Administered Date(s) Administered   Fluad Quad(high Dose 65+) 10/24/2018, 10/27/2019, 11/24/2020   Influenza Whole 11/11/2007, 11/03/2008   Influenza, High Dose Seasonal PF 12/04/2013, 09/27/2015, 10/02/2017   Influenza-Unspecified 11/08/2012, 11/03/2014, 10/15/2015   PFIZER(Purple Top)SARS-COV-2 Vaccination 02/20/2019, 03/13/2019, 11/15/2019, 05/19/2020   Pfizer Covid-19 Vaccine Bivalent Booster 67yr & up 11/04/2020   Pneumococcal Conjugate-13 11/03/2014   Pneumococcal Polysaccharide-23 04/09/2013   Tdap 08/13/2014   Zoster, Live 06/24/2010    TDAP status: Up to date  Flu Vaccine status: Up to date  Pneumococcal vaccine status: Up to date  Covid-19 vaccine status: Completed vaccines  Qualifies for Shingles Vaccine? Yes   Zostavax completed No   Shingrix Completed?: No.    Education has been provided regarding the importance of this vaccine. Patient has been advised to call insurance company to determine out of pocket expense if they have not yet received this vaccine. Advised may also receive vaccine at local pharmacy or Health Dept. Verbalized acceptance and understanding.  Screening Tests Health Maintenance  Topic Date Due   Zoster Vaccines- Shingrix (1 of 2) Never done   FOOT EXAM  04/03/2019   INFLUENZA VACCINE  08/30/2021   OPHTHALMOLOGY EXAM  10/18/2021   Diabetic kidney evaluation - Urine ACR  12/01/2021   Medicare Annual Wellness (AWV)  12/01/2021   COVID-19 Vaccine (6 - Pfizer series) 12/28/2021 (Originally 03/07/2021)   HEMOGLOBIN A1C  01/25/2022   Diabetic kidney evaluation - GFR measurement  07/27/2022   TETANUS/TDAP   08/12/2024   Pneumonia Vaccine 80 Years old  Completed   HPV VACCINES  Aged Out    Health Maintenance  Health Maintenance Due  Topic Date Due   Zoster Vaccines- Shingrix (1 of 2) Never done   FOOT EXAM  04/03/2019   INFLUENZA VACCINE  08/30/2021   OPHTHALMOLOGY EXAM  10/18/2021   Diabetic kidney evaluation - Urine ACR  12/01/2021   Medicare Annual Wellness (AWV)  12/01/2021    Colorectal cancer screening: No longer required.   Lung Cancer Screening: (Low Dose CT Chest recommended  if Age 48-80 years, 50 pack-year currently smoking OR have quit w/in 15years.) does not qualify.   Lung Cancer Screening Referral: N/A  Additional Screening:  Hepatitis C Screening: does not qualify; Completed N/A  Vision Screening: Recommended annual ophthalmology exams for early detection of glaucoma and other disorders of the eye. Is the patient up to date with their annual eye exam?  Yes  Who is the provider or what is the name of the office in which the patient attends annual eye exams? [facility] If pt is not established with a provider, would they like to be referred to a provider to establish care? No .   Dental Screening: Recommended annual dental exams for proper oral hygiene  Community Resource Referral / Chronic Care Management: CRR required this visit?  No   CCM required this visit?  No      Plan:     I have personally reviewed and noted the following in the patient's chart:   Medical and social history Use of alcohol, tobacco or illicit drugs  Current medications and supplements including opioid prescriptions. Patient is not currently taking opioid prescriptions. Functional ability and status Nutritional status Physical activity Advanced directives List of other physicians Hospitalizations, surgeries, and ER visits in previous 12 months Vitals Screenings to include cognitive, depression, and falls Referrals and appointments  In addition, I have reviewed and  discussed with patient certain preventive protocols, quality metrics, and best practice recommendations. A written personalized care plan for preventive services as well as general preventive health recommendations were provided to patient.     Rossie Muskrat, CMA   12/12/2021   Nurse Notes: DM eye exam report requested.   Living Will and [place] copy requested.

## 2021-12-12 NOTE — Patient Instructions (Signed)
It was great speaking with you today!  Please schedule your next Medicare Wellness Visit with your Nurse Health Advisor in 1 year by calling [phone]. 

## 2021-12-13 DIAGNOSIS — Z9889 Other specified postprocedural states: Secondary | ICD-10-CM | POA: Diagnosis not present

## 2021-12-13 DIAGNOSIS — M48062 Spinal stenosis, lumbar region with neurogenic claudication: Secondary | ICD-10-CM | POA: Diagnosis not present

## 2021-12-19 ENCOUNTER — Encounter: Payer: Self-pay | Admitting: Internal Medicine

## 2021-12-19 NOTE — Patient Instructions (Addendum)
Blood work was ordered.   The lab is on the first floor.    Medications changes include :   none    Return in about 6 months (around 06/20/2022) for follow up.   Health Maintenance, Male Adopting a healthy lifestyle and getting preventive care are important in promoting health and wellness. Ask your health care provider about: The right schedule for you to have regular tests and exams. Things you can do on your own to prevent diseases and keep yourself healthy. What should I know about diet, weight, and exercise? Eat a healthy diet  Eat a diet that includes plenty of vegetables, fruits, low-fat dairy products, and lean protein. Do not eat a lot of foods that are high in solid fats, added sugars, or sodium. Maintain a healthy weight Body mass index (BMI) is a measurement that can be used to identify possible weight problems. It estimates body fat based on height and weight. Your health care provider can help determine your BMI and help you achieve or maintain a healthy weight. Get regular exercise Get regular exercise. This is one of the most important things you can do for your health. Most adults should: Exercise for at least 150 minutes each week. The exercise should increase your heart rate and make you sweat (moderate-intensity exercise). Do strengthening exercises at least twice a week. This is in addition to the moderate-intensity exercise. Spend less time sitting. Even light physical activity can be beneficial. Watch cholesterol and blood lipids Have your blood tested for lipids and cholesterol at 80 years of age, then have this test every 5 years. You may need to have your cholesterol levels checked more often if: Your lipid or cholesterol levels are high. You are older than 80 years of age. You are at high risk for heart disease. What should I know about cancer screening? Many types of cancers can be detected early and may often be prevented. Depending on your  health history and family history, you may need to have cancer screening at various ages. This may include screening for: Colorectal cancer. Prostate cancer. Skin cancer. Lung cancer. What should I know about heart disease, diabetes, and high blood pressure? Blood pressure and heart disease High blood pressure causes heart disease and increases the risk of stroke. This is more likely to develop in people who have high blood pressure readings or are overweight. Talk with your health care provider about your target blood pressure readings. Have your blood pressure checked: Every 3-5 years if you are 4-66 years of age. Every year if you are 53 years old or older. If you are between the ages of 73 and 45 and are a current or former smoker, ask your health care provider if you should have a one-time screening for abdominal aortic aneurysm (AAA). Diabetes Have regular diabetes screenings. This checks your fasting blood sugar level. Have the screening done: Once every three years after age 62 if you are at a normal weight and have a low risk for diabetes. More often and at a younger age if you are overweight or have a high risk for diabetes. What should I know about preventing infection? Hepatitis B If you have a higher risk for hepatitis B, you should be screened for this virus. Talk with your health care provider to find out if you are at risk for hepatitis B infection. Hepatitis C Blood testing is recommended for: Everyone born from [birth year] through 1964. Anyone with known risk factors  for hepatitis C. Sexually transmitted infections (STIs) You should be screened each year for STIs, including gonorrhea and chlamydia, if: You are sexually active and are younger than 80 years of age. You are older than 80 years of age and your health care provider tells you that you are at risk for this type of infection. Your sexual activity has changed since you were last screened, and you are at increased risk  for chlamydia or gonorrhea. Ask your health care provider if you are at risk. Ask your health care provider about whether you are at high risk for HIV. Your health care provider may recommend a prescription medicine to help prevent HIV infection. If you choose to take medicine to prevent HIV, you should first get tested for HIV. You should then be tested every 3 months for as long as you are taking the medicine. Follow these instructions at home: Alcohol use Do not drink alcohol if your health care provider tells you not to drink. If you drink alcohol: Limit how much you have to 0-2 drinks a day. Know how much alcohol is in your drink. In the [place] one drink equals one 12 oz bottle of beer (355 mL), one 5 oz glass of wine (148 mL), or one 1 oz glass of hard liquor (44 mL). Lifestyle Do not use any products that contain nicotine or tobacco. These products include cigarettes, chewing tobacco, and vaping devices, such as e-cigarettes. If you need help quitting, ask your health care provider. Do not use street drugs. Do not share needles. Ask your health care provider for help if you need support or information about quitting drugs. General instructions Schedule regular health, dental, and eye exams. Stay current with your vaccines. Tell your health care provider if: You often feel depressed. You have ever been abused or do not feel safe at home. Summary Adopting a healthy lifestyle and getting preventive care are important in promoting health and wellness. Follow your health care provider's instructions about healthy diet, exercising, and getting tested or screened for diseases. Follow your health care provider's instructions on monitoring your cholesterol and blood pressure. This information is not intended to replace advice given to you by your health care provider. Make sure you discuss any questions you have with your health care provider. Document Revised: 06/07/2020 Document Reviewed:  06/07/2020 Elsevier Patient Education  [place].

## 2021-12-19 NOTE — Progress Notes (Unsigned)
Subjective:    Patient ID: Bryce Maldonado, male    DOB: May 17, 1940, 81 y.o.   MRN: [id number]     HPI Bryce Maldonado is here for a physical exam.   Had back surgery in July and is doing well - no pain.  He is limited by his arthritis pain.    He has fallen a couple of times - most were prior to his surgery. He only fell once after his surgery when carrying a lot into the house and fell.     Overall he feels pretty well and has no concerns.  Medications and allergies reviewed with patient and updated if appropriate.  Current Outpatient Medications on File Prior to Visit  Medication Sig Dispense Refill   allopurinol (ZYLOPRIM) 300 MG tablet TAKE 1 TABLET(300 MG) BY MOUTH DAILY 90 tablet 1   apixaban (ELIQUIS) 5 MG TABS tablet Take 1 tablet (5 mg total) by mouth 2 (two) times daily. 180 tablet 3   ezetimibe (ZETIA) 10 MG tablet TAKE 1 TABLET(10 MG) BY MOUTH AT BEDTIME 90 tablet 3   JARDIANCE 25 MG TABS tablet TAKE 1 TABLET(25 MG) BY MOUTH DAILY 90 tablet 2   losartan (COZAAR) 25 MG tablet TAKE 1 TABLET(25 MG) BY MOUTH DAILY 90 tablet 1   Melatonin 5 MG TABS Take 5 mg by mouth at bedtime.     metFORMIN (GLUCOPHAGE) 500 MG tablet TAKE 2 TABLETS(1000 MG) BY MOUTH TWICE DAILY WITH A MEAL 360 tablet 2   Multiple Vitamins-Minerals (CENTRUM PO) Take 1 tablet by mouth daily.     OVER THE COUNTER MEDICATION Take 3 capsules by mouth daily. Balance of Nature Fruits and Veggies     rosuvastatin (CRESTOR) 10 MG tablet TAKE 1 TABLET(10 MG) BY MOUTH AT BEDTIME 90 tablet 3   sodium chloride (OCEAN) 0.65 % SOLN nasal spray Place 1 spray into both nostrils at bedtime.     venlafaxine XR (EFFEXOR-XR) 37.5 MG 24 hr capsule TAKE 1 CAPSULE(37.5 MG) BY MOUTH DAILY WITH BREAKFAST 90 capsule 3   No current facility-administered medications on file prior to visit.    Review of Systems  Constitutional:  Negative for chills and fever.  Eyes:  Negative for visual disturbance.  Respiratory:  Negative for cough,  shortness of breath and wheezing.   Cardiovascular:  Negative for chest pain, palpitations and leg swelling.  Gastrointestinal:  Negative for abdominal pain, blood in stool, constipation, diarrhea and nausea.       No gerd  Genitourinary:  Negative for difficulty urinating, dysuria and hematuria.  Musculoskeletal:  Positive for arthralgias (diffuse OA). Negative for back pain.  Skin:  Negative for rash.  Neurological:  Negative for dizziness, light-headedness and headaches.  Psychiatric/Behavioral:  Positive for dysphoric mood. The patient is nervous/anxious.        Objective:   Vitals:   12/20/21 0952  BP: 124/70  Pulse: 82  Temp: 98 F (36.7 C)  SpO2: 97%   Filed Weights   12/20/21 0952  Weight: 188 lb (85.3 kg)   Body mass index is 25.5 kg/m.  BP Readings from Last 3 Encounters:  12/20/21 124/70  08/04/21 120/60  07/26/21 (!) 141/75    Wt Readings from Last 3 Encounters:  12/20/21 188 lb (85.3 kg)  08/03/21 193 lb 12.6 oz (87.9 kg)  07/26/21 193 lb (87.5 kg)      Physical Exam Constitutional: He appears well-developed and well-nourished. No distress.  HENT:  Head: Normocephalic and atraumatic.  Right Ear: External  ear normal.  Left Ear: External ear normal.  Mouth/Throat: Oropharynx is clear and moist.  Normal ear canals and TM b/l  Eyes: Conjunctivae and EOM are normal.  Neck: Neck supple. No tracheal deviation present. No thyromegaly present.  No carotid bruit  Cardiovascular: Normal rate, regular rhythm, normal heart sounds and intact distal pulses.   No murmur heard. Pulmonary/Chest: Effort normal and breath sounds normal. No respiratory distress. He has no wheezes. He has no rales.  Abdominal: Soft. He exhibits no distension. There is no tenderness.  Genitourinary: deferred  Musculoskeletal: He exhibits no edema.  Lymphadenopathy:   He has no cervical adenopathy.  Skin: Skin is warm and dry. He is not diaphoretic.  Psychiatric: He has a normal mood  and affect. His behavior is normal.         Assessment & Plan:   Physical exam: Screening blood work  ordered Exercise   none Weight good for age Substance abuse   none   Reviewed recommended immunizations.   Health Maintenance  Topic Date Due   FOOT EXAM  04/03/2019   OPHTHALMOLOGY EXAM  10/18/2021   Diabetic kidney evaluation - Urine ACR  12/01/2021   Zoster Vaccines- Shingrix (1 of 2) 03/22/2022 (Originally 10/09/1991)   COVID-19 Vaccine (7 - 2023-24 season) 12/29/2021   HEMOGLOBIN A1C  01/25/2022   Diabetic kidney evaluation - GFR measurement  07/27/2022   Medicare Annual Wellness (AWV)  12/13/2022   Pneumonia Vaccine 64+ Years old  Completed   INFLUENZA VACCINE  Completed   HPV VACCINES  Aged Out     See Problem List for Assessment and Plan of chronic medical problems.

## 2021-12-20 ENCOUNTER — Ambulatory Visit (INDEPENDENT_AMBULATORY_CARE_PROVIDER_SITE_OTHER): Payer: Medicare PPO | Admitting: Internal Medicine

## 2021-12-20 VITALS — BP 124/70 | HR 82 | Temp 98.0°F | Ht 72.0 in | Wt 188.0 lb

## 2021-12-20 DIAGNOSIS — E1159 Type 2 diabetes mellitus with other circulatory complications: Secondary | ICD-10-CM | POA: Diagnosis not present

## 2021-12-20 DIAGNOSIS — M1A9XX0 Chronic gout, unspecified, without tophus (tophi): Secondary | ICD-10-CM

## 2021-12-20 DIAGNOSIS — F419 Anxiety disorder, unspecified: Secondary | ICD-10-CM | POA: Diagnosis not present

## 2021-12-20 DIAGNOSIS — I25810 Atherosclerosis of coronary artery bypass graft(s) without angina pectoris: Secondary | ICD-10-CM | POA: Diagnosis not present

## 2021-12-20 DIAGNOSIS — I480 Paroxysmal atrial fibrillation: Secondary | ICD-10-CM

## 2021-12-20 DIAGNOSIS — E782 Mixed hyperlipidemia: Secondary | ICD-10-CM

## 2021-12-20 DIAGNOSIS — Z0000 Encounter for general adult medical examination without abnormal findings: Secondary | ICD-10-CM

## 2021-12-20 DIAGNOSIS — I10 Essential (primary) hypertension: Secondary | ICD-10-CM | POA: Diagnosis not present

## 2021-12-20 DIAGNOSIS — Z9181 History of falling: Secondary | ICD-10-CM | POA: Diagnosis not present

## 2021-12-20 LAB — LIPID PANEL
Cholesterol: 139 mg/dL (ref 0–200)
HDL: 56.4 mg/dL (ref >39.00)
LDL Cholesterol: 62 mg/dL (ref 0–99)
NonHDL: 82.21
Total CHOL/HDL Ratio: 2
Triglycerides: 103 mg/dL (ref 0.0–149.0)
VLDL: 20.6 mg/dL (ref 0.0–40.0)

## 2021-12-20 LAB — COMPREHENSIVE METABOLIC PANEL
ALT: 14 U/L (ref 0–53)
AST: 16 U/L (ref 0–37)
Albumin: 5 g/dL (ref 3.5–5.2)
Alkaline Phosphatase: 51 U/L (ref 39–117)
BUN: 19 mg/dL (ref 6–23)
CO2: 31 mEq/L (ref 19–32)
Calcium: 10.7 mg/dL — ABNORMAL HIGH (ref 8.4–10.5)
Chloride: 99 mEq/L (ref 96–112)
Creatinine, Ser: 0.82 mg/dL (ref 0.40–1.50)
GFR: 83.14 mL/min (ref >60.00)
Glucose, Bld: 144 mg/dL — ABNORMAL HIGH (ref 70–99)
Potassium: 4.5 mEq/L (ref 3.5–5.1)
Sodium: 138 mEq/L (ref 135–145)
Total Bilirubin: 0.8 mg/dL (ref 0.2–1.2)
Total Protein: 8.1 g/dL (ref 6.0–8.3)

## 2021-12-20 LAB — MICROALBUMIN / CREATININE URINE RATIO
Creatinine,U: 55.4 mg/dL
Microalb Creat Ratio: 1.8 mg/g (ref 0.0–30.0)
Microalb, Ur: 1 mg/dL (ref 0.0–1.9)

## 2021-12-20 LAB — CBC WITH DIFFERENTIAL/PLATELET
Basophils Absolute: 0.1 10*3/uL (ref 0.0–0.1)
Basophils Relative: 0.9 % (ref 0.0–3.0)
Eosinophils Absolute: 0.2 10*3/uL (ref 0.0–0.7)
Eosinophils Relative: 2.6 % (ref 0.0–5.0)
HCT: 48.4 % (ref 39.0–52.0)
Hemoglobin: 16.6 g/dL (ref 13.0–17.0)
Lymphocytes Relative: 16.7 % (ref 12.0–46.0)
Lymphs Abs: 1.1 10*3/uL (ref 0.7–4.0)
MCHC: 34.2 g/dL (ref 30.0–36.0)
MCV: 91.8 fl (ref 78.0–100.0)
Monocytes Absolute: 0.8 10*3/uL (ref 0.1–1.0)
Monocytes Relative: 11.4 % (ref 3.0–12.0)
Neutro Abs: 4.7 10*3/uL (ref 1.4–7.7)
Neutrophils Relative %: 68.4 % (ref 43.0–77.0)
Platelets: 295 10*3/uL (ref 150.0–400.0)
RBC: 5.27 Mil/uL (ref 4.22–5.81)
RDW: 14.6 % (ref 11.5–15.5)
WBC: 6.9 10*3/uL (ref 4.0–10.5)

## 2021-12-20 LAB — HEMOGLOBIN A1C: Hgb A1c MFr Bld: 7.3 % — ABNORMAL HIGH (ref 4.6–6.5)

## 2021-12-20 LAB — TSH: TSH: 0.99 u[IU]/mL (ref 0.35–5.50)

## 2021-12-20 NOTE — Assessment & Plan Note (Signed)
Chronic   Lab Results  Component Value Date   HGBA1C 7.0 (H) 07/26/2021   Sugars not ideally controlled Check A1c, urine microalbumin today Continue Jardiance 25 mg daily, metformin 1000 mg twice daily Stressed regular exercise, diabetic diet

## 2021-12-20 NOTE — Assessment & Plan Note (Signed)
Chronic Blood pressure well controlled CMP Continue losartan 25 mg daily 

## 2021-12-20 NOTE — Assessment & Plan Note (Signed)
Chronic Follows with cardiology No symptoms consistent with angina On Eliquis, Zetia, Crestor, Jardiance and losartan

## 2021-12-20 NOTE — Assessment & Plan Note (Signed)
Chronic Controlled No recent gout attacks Continue allopurinol 300 mg daily

## 2021-12-20 NOTE — Assessment & Plan Note (Signed)
He did have some falls in this past year all but 1 were prior to back surgery and his gait has improved since surgery The fall that occurred after surgery was related to carrying too much and going up a step Discussed fall prevention Discussed the importance of regular exercise and working to maintain balance

## 2021-12-20 NOTE — Assessment & Plan Note (Signed)
Chronic Appoint with cardiology On Eliquis 5 mg twice daily Rate controlled without medication

## 2021-12-20 NOTE — Assessment & Plan Note (Signed)
Chronic Regular exercise and healthy diet encouraged Check lipid panel  Continue Crestor 10 mg daily, Zetia 10 mg daily 

## 2021-12-20 NOTE — Assessment & Plan Note (Signed)
Chronic Controlled, Stable Continue Effexor XR 37.5 mg daily

## 2022-01-05 ENCOUNTER — Encounter: Payer: Self-pay | Admitting: Internal Medicine

## 2022-01-05 NOTE — Progress Notes (Signed)
Outside notes received. Information abstracted. Notes sent to scan.  

## 2022-01-27 ENCOUNTER — Ambulatory Visit
Admission: EM | Admit: 2022-01-27 | Discharge: 2022-01-27 | Disposition: A | Discharge status: Home / Self Care | Payer: Medicare PPO | Attending: Emergency Medicine | Admitting: Emergency Medicine

## 2022-01-27 ENCOUNTER — Ambulatory Visit (INDEPENDENT_AMBULATORY_CARE_PROVIDER_SITE_OTHER): Payer: Medicare PPO

## 2022-01-27 DIAGNOSIS — R042 Hemoptysis: Secondary | ICD-10-CM | POA: Diagnosis not present

## 2022-01-27 DIAGNOSIS — J4521 Mild intermittent asthma with (acute) exacerbation: Secondary | ICD-10-CM | POA: Diagnosis not present

## 2022-01-27 DIAGNOSIS — R059 Cough, unspecified: Secondary | ICD-10-CM | POA: Diagnosis not present

## 2022-01-27 MED ORDER — CETIRIZINE HCL 10 MG PO TABS: 10.0000 mg | ORAL_TABLET | Freq: Every day | ORAL | 1 refills | Status: DC

## 2022-01-27 MED ORDER — ALBUTEROL SULFATE (2.5 MG/3ML) 0.083% IN NEBU
2.5000 mg | INHALATION_SOLUTION | Freq: Once | RESPIRATORY_TRACT | Status: AC
  Administered 2022-01-27: 2.5 mg via RESPIRATORY_TRACT

## 2022-01-27 MED ORDER — TRELEGY ELLIPTA 200-62.5-25 MCG/ACT IN AEPB: 1.0000 | INHALATION_SPRAY | Freq: Every morning | RESPIRATORY_TRACT | 0 refills | Status: AC

## 2022-01-27 MED ORDER — ALBUTEROL SULFATE HFA 108 (90 BASE) MCG/ACT IN AERS: 2.0000 | INHALATION_SPRAY | Freq: Four times a day (QID) | RESPIRATORY_TRACT | 2 refills | Status: DC | PRN

## 2022-01-27 NOTE — ED Triage Notes (Signed)
Pt c/o coughing up blood this morning but has had a cough since Friday.   Home interventions: coricidin

## 2022-01-27 NOTE — Discharge Instructions (Addendum)
Your chest x-ray was not concerning for pneumonia.  You had meaningful improvement of your breath sounds after the albuterol treatment you received during your visit today.  At this time, I believe that you are having an acute exacerbation of a very remote history of asthma.  I believe it is very likely this was triggered by a viral respiratory infection.  Please read below to learn more about the medications, dosages and frequencies that I recommend to help alleviate your symptoms and to get you feeling better soon:   Zyrtec (cetirizine): This is an excellent second-generation antihistamine that helps to reduce respiratory inflammatory response to environmental allergens.  In some patients, this medication can cause daytime sleepiness so I recommend that you take 1 tablet daily at bedtime.     ProAir, Ventolin, Proventil (albuterol): This inhaled medication contains a short acting beta agonist bronchodilator.  This medication works on the smooth muscle that opens and constricts of your airways by relaxing the muscle.  The result of relaxation of the smooth muscle is increased air movement and improved work of breathing.  This is a short acting medication that can be used every 4-6 hours as needed for increased work of breathing, shortness of breath, wheezing and excessive coughing.  I have provided you with a prescription.    Trelegy (fluticasone, vilanterol and umeclidinium):  This inhaled medication contains a corticosteroid and long-acting form of albuterol.  The inhaled steroid and this medication  is not absorbed into the body and will not cause side effects such as increased blood sugar levels, irritability, sleeplessness or weight gain.  Inhaled corticosteroid are sort of like topical steroid creams but, as you can imagine, it is not practical to attempt to rub a steroid cream inside of your lungs.  The long-acting albuterol works similarly to the short acting albuterol found in your rescue inhaler  but provides 24-hour relaxation of the smooth muscles that open and constrict your airways; your short acting rescue inhaler can only provide for a few hours this benefit for a few hours.  The third unique ingredient, umeclidinium, is an antimuscarinic and works similarly to your albuterol, providing long-acting relaxation of the smooth muscles in your airway.  Please feel free to continue using your short acting rescue inhaler as often as needed throughout the day for shortness of breath, wheezing, and cough.    Please follow-up within the next 7-10 days either with your primary care provider or urgent care if your symptoms do not resolve.     Thank you for visiting urgent care today.  We appreciate the opportunity to participate in your care.

## 2022-01-27 NOTE — ED Provider Notes (Incomplete)
[facility]    CSN: [id number] Arrival date & time: 01/27/22  1548    HISTORY   Chief Complaint  Patient presents with   Cough   HPI Bryce Maldonado is a pleasant, 81 y.o. male who presents to urgent care today. Patient complains of a cough for the past 7 days, states has been taking Coricidin without meaningful relief.  Patient reports a history of asthma as a child but denies history of COPD as an adult.  Patient reports coughing up blood-tinged sputum this morning.  Patient has an oxygen saturation of 91% with otherwise normal vital signs on arrival today.  Chest x-ray today was unremarkable for acute pulmonary abnormalities.  The history is provided by the patient.   Past Medical History:  Diagnosis Date   Anemia    Atrial fibrillation [facility])    pt has had an ablation   BPH (benign prostatic hyperplasia)    Elevated PSA   Childhood asthma    Coronary artery disease    post CABG x4 -- in 1995 -- due to three-vessel coronary artery disease   Diverticular disease    Dyslipidemia    First degree AV block    Gout    Hypertension    Osteoarthritis    "everywhere" (12/04/2014)   Prostate cancer ([place]) 01/30/2010   Dr Karsten Ro   Spinal stenosis 01/30/2002   L4 radiculopathy, herniated nucleus pulposus L4-5   Tinnitus    Type II diabetes mellitus (HCC)    Typical atrial flutter (HCC)    s/p ablation   Patient Active Problem List   Diagnosis Date Noted   Personal history of fall 12/20/2021   Spinal stenosis of lumbar region with neurogenic claudication 08/03/2021   Physical deconditioning 06/01/2021   Chronic low back pain 06/01/2021   Scalp pruritus 12/01/2020   Toe pain 04/03/2018   Mild aortic stenosis 10/02/2017   Hypertension 09/27/2016   Anxiety 03/27/2016   SVT (supraventricular tachycardia) 12/04/2014   Typical atrial flutter ([place]) 11/03/2014   PAF (paroxysmal atrial fibrillation) ([place]) 08/13/2014   Diabetes (Uncertain) 04/22/2013   Gout 03/04/2009    ATOPIC RHINITIS 03/04/2009   PROSTATE CANCER, HX OF 03/04/2009   Mixed hyperlipidemia 08/06/2006   Coronary atherosclerosis 08/06/2006   Past Surgical History:  Procedure Laterality Date   APPENDECTOMY  1943   ATRIAL FLUTTER ABLATION  12/04/2014   BACK SURGERY     CARDIAC CATHETERIZATION  02/10/2010   Est. EF of 60-65% -- Severe three-vessel obstructive atherosclerotic coronary artery disease -- All grafts were patent including left internal mammary artery graft to left anterior descending coronary artery, saphenous vein graft to the diagonal, saphenous vein graft to obtuse marginal vessel, and saphenous vein graft to the posterior descending coronary artery -- Normal left ventricular function    CATARACT EXTRACTION Bilateral 2023   COLONOSCOPY     negative X3; Dr Carlean Purl   CORONARY ARTERY BYPASS GRAFT  10/21/1993   x4 -- using the LIM artery graft to the LAD artery, with saphenous vein grafts to the diagonal branch of the LAD, OM branch the left circumflex coronary artery, and the posterior descending branch of the RCA -- Est. EF of 65%-- Surgeon: Gaye Pollack, M.D.               ELECTROPHYSIOLOGIC STUDY N/A 10/01/2014   CTI ablation by Dr Curt Bears   ELECTROPHYSIOLOGIC STUDY N/A 12/04/2014   repeat CTI ablation by Dr Marinda Elk SIGMOIDOSCOPY  01/31/2003  external hemorrhoids   JOINT REPLACEMENT     LUMBAR LAMINECTOMY/DECOMPRESSION MICRODISCECTOMY Bilateral 08/03/2021   Procedure: Lumbar two-three, Lumbar three-four, Lumbar four-five BILATERAL LAMINOTOMY/FORAMINOTOMY;  Surgeon: Newman Pies, MD;  Location: [facility];  Service: Neurosurgery;  Laterality: Bilateral;   MICRODISCECTOMY LUMBAR  01/30/2002   L4-5, Dr. Arnoldo Morale   PROSTATE BIOPSY  10/30/2008   "had radiation tx for prostate cancer"   SALIVARY STONE REMOVAL  01/31/1999   TOTAL HIP ARTHROPLASTY Right 11/30/2008   TOTAL HIP ARTHROPLASTY Left 03/02/2010   Dr Alvan Dame    Home Medications    Prior to Admission medications    Medication Sig Start Date End Date Taking? Authorizing Provider  allopurinol (ZYLOPRIM) 300 MG tablet TAKE 1 TABLET(300 MG) BY MOUTH DAILY 11/29/21   Burns, Claudina Lick, MD  apixaban (ELIQUIS) 5 MG TABS tablet Take 1 tablet (5 mg total) by mouth 2 (two) times daily. 02/22/21   Martinique, Peter M, MD  ezetimibe (ZETIA) 10 MG tablet TAKE 1 TABLET(10 MG) BY MOUTH AT BEDTIME 03/02/21   [place], Peter M, MD  JARDIANCE 25 MG TABS tablet TAKE 1 TABLET(25 MG) BY MOUTH DAILY 09/12/21   Binnie Rail, MD  losartan (COZAAR) 25 MG tablet TAKE 1 TABLET(25 MG) BY MOUTH DAILY 11/29/21   Binnie Rail, MD  Melatonin 5 MG TABS Take 5 mg by mouth at bedtime.    [provider]  metFORMIN (GLUCOPHAGE) 500 MG tablet TAKE 2 TABLETS(1000 MG) BY MOUTH TWICE DAILY WITH A MEAL 09/12/21   Burns, Claudina Lick, MD  Multiple Vitamins-Minerals (CENTRUM PO) Take 1 tablet by mouth daily.    [provider]  OVER THE COUNTER MEDICATION Take 3 capsules by mouth daily. Balance of [employer], Historical, MD  rosuvastatin (CRESTOR) 10 MG tablet TAKE 1 TABLET(10 MG) BY MOUTH AT BEDTIME 05/31/21   [place], Peter M, MD  sodium chloride (OCEAN) 0.65 % SOLN nasal spray Place 1 spray into both nostrils at bedtime.    [provider]  venlafaxine XR (EFFEXOR-XR) 37.5 MG 24 hr capsule TAKE 1 CAPSULE(37.5 MG) BY MOUTH DAILY WITH BREAKFAST 11/17/21   Binnie Rail, MD    Family History Family History  Problem Relation Age of Onset   Diabetes Father    Hypertension Father    Lung cancer Father        smoker   Heart failure Mother 8   Hyperlipidemia Sister    Coronary artery disease Sister    Heart attack Paternal Aunt        X 2; both > 26   Colon cancer Neg Hx    Stroke Neg Hx    Social History Social History   Tobacco Use   Smoking status: Former    Packs/day: 2.50    Years: 15.00    Total pack years: 37.50    Types: Cigarettes    Quit date: 01/30/1970    Years since quitting: 52.0    Smokeless tobacco: Never   Tobacco comments:    smoked 1959-1972  Vaping Use   Vaping Use: Never used  Substance Use Topics   Alcohol use: Not Currently    Alcohol/week: 2.0 standard drinks of alcohol    Types: 1 Cans of beer, 1 Standard drinks or equivalent per week    Comment: maybe a beer or 1 liquor drink once every 2 weeks   Drug use: No   Allergies   Naproxen sodium  Review of Systems Review of Systems  Pertinent findings revealed after performing a 14 point review of systems has been noted in the history of present illness.  Physical Exam Triage Vital Signs ED Triage Vitals  Enc Vitals Group     BP 11/26/20 0827 (!) 147/82     Pulse Rate 11/26/20 0827 72     Resp 11/26/20 0827 18     Temp 11/26/20 0827 98.3 F (36.8 C)     Temp Source 11/26/20 0827 Oral     SpO2 11/26/20 0827 98 %     Weight --      Height --      Head Circumference --      Peak Flow --      Pain Score 11/26/20 0826 5     Pain Loc --      Pain Edu? --      Excl. in [place]? --   No data found.  Updated Vital Signs BP 135/72 (BP Location: Left Arm)   Pulse 98   Temp 97.8 F (36.6 C) (Oral)   Resp 16   SpO2 91%   Physical Exam Vitals and nursing note reviewed.  Constitutional:      General: He is not in acute distress.    Appearance: Normal appearance. He is not ill-appearing.  HENT:     Head: Normocephalic and atraumatic.     Salivary Glands: Right salivary gland is not diffusely enlarged or tender. Left salivary gland is not diffusely enlarged or tender.     Right Ear: Hearing, tympanic membrane, ear canal and external ear normal. No drainage. No middle ear effusion. There is no impacted cerumen. Tympanic membrane is not erythematous or bulging.     Left Ear: Hearing, tympanic membrane, ear canal and external ear normal. No drainage.  No middle ear effusion. There is no impacted cerumen. Tympanic membrane is not erythematous or bulging.     Nose: Rhinorrhea present. No nasal deformity,  septal deviation, mucosal edema or congestion. Rhinorrhea is clear.     Right Turbinates: Not enlarged, swollen or pale.     Left Turbinates: Not enlarged, swollen or pale.     Right Sinus: No maxillary sinus tenderness or frontal sinus tenderness.     Left Sinus: No maxillary sinus tenderness or frontal sinus tenderness.     Mouth/Throat:     Lips: Pink. No lesions.     Mouth: Mucous membranes are moist. No oral lesions.     Pharynx: Oropharynx is clear. Uvula midline. No pharyngeal swelling, posterior oropharyngeal erythema or uvula swelling.     Tonsils: No tonsillar exudate. 0 on the right. 0 on the left.  Eyes:     General: Lids are normal.        Right eye: No discharge.        Left eye: No discharge.     Extraocular Movements: Extraocular movements intact.     Conjunctiva/sclera: Conjunctivae normal.     Right eye: Right conjunctiva is not injected.     Left eye: Left conjunctiva is not injected.     Pupils: Pupils are equal, round, and reactive to light.  Neck:     Trachea: Trachea and phonation normal.  Cardiovascular:     Rate and Rhythm: Normal rate and regular rhythm.     Pulses: Normal pulses.     Heart sounds: Normal heart sounds, S1 normal and S2 normal. Heart sounds not distant. No murmur heard.    No friction rub. No gallop.  Pulmonary:  Effort: Pulmonary effort is normal. No tachypnea, bradypnea, accessory muscle usage, prolonged expiration, respiratory distress or retractions.     Breath sounds: No stridor, decreased air movement or transmitted upper airway sounds. Examination of the right-upper field reveals wheezing. Examination of the left-upper field reveals wheezing. Examination of the right-middle field reveals wheezing. Examination of the left-middle field reveals wheezing. Examination of the right-lower field reveals decreased breath sounds. Examination of the left-lower field reveals decreased breath sounds. Decreased breath sounds and wheezing present. No  rhonchi or rales.  Chest:     Chest wall: No tenderness.  Musculoskeletal:        General: Normal range of motion.     Cervical back: Full passive range of motion without pain, normal range of motion and neck supple. Normal range of motion.  Lymphadenopathy:     Cervical: No cervical adenopathy.  Skin:    General: Skin is warm and dry.     Findings: No erythema or rash.  Neurological:     General: No focal deficit present.     Mental Status: He is alert and oriented to person, place, and time.  Psychiatric:        Mood and Affect: Mood normal.        Behavior: Behavior normal.     Visual Acuity Right Eye Distance:   Left Eye Distance:   Bilateral Distance:    Right Eye Near:   Left Eye Near:    Bilateral Near:     [facility] Couse / Diagnostics / Procedures:     Radiology DG Chest 2 View  Result Date: 01/27/2022 CLINICAL DATA:  Hemoptysis, cough EXAM: CHEST - 2 VIEW COMPARISON:  11/23/2016 FINDINGS: Cardiac size is within normal limits. There is previous coronary bypass surgery. Lung fields are clear of any infiltrates or pulmonary edema. There is no pleural effusion or pneumothorax. Marked degenerative changes are noted in both shoulders. IMPRESSION: There are no signs of pulmonary edema or focal pulmonary consolidation. Electronically Signed   By: Elmer Picker M.D.   On: 01/27/2022 19:28    Procedures Procedures (including critical care time) EKG  Pending results:  Labs Reviewed - No data to display  Medications Ordered in UC: Medications  albuterol (PROVENTIL) (2.5 MG/3ML) 0.083% nebulizer solution 2.5 mg (2.5 mg Nebulization Given 01/27/22 1941)    UC Diagnoses / Final Clinical Impressions(s)   I have reviewed the triage vital signs and the nursing notes.  Pertinent labs & imaging results that were available during my care of the patient were reviewed by me and considered in my medical decision making (see chart for details).    Final diagnoses:  Mild  intermittent asthma with acute exacerbation in adult   *** Please see discharge instructions below for further details of plan of care as provided to patient. ED Prescriptions     Medication Sig Dispense Auth. Provider   Fluticasone-Umeclidin-Vilant (TRELEGY ELLIPTA) 200-62.5-25 MCG/ACT AEPB Inhale 1 puff into the lungs in the morning for 14 days. 1 each Lynden Oxford Scales, PA-C   cetirizine (ZYRTEC ALLERGY) 10 MG tablet Take 1 tablet (10 mg total) by mouth at bedtime. 90 tablet Lynden Oxford Scales, [place]      PDMP not reviewed this encounter.  Disposition Upon Discharge:  Condition: stable for discharge home Home: take medications as prescribed; routine discharge instructions as discussed; follow up as advised.  Patient presented with an acute illness with associated systemic symptoms and significant discomfort requiring urgent management. In my opinion, this is a  condition that a prudent lay person (someone who possesses an average knowledge of health and medicine) may potentially expect to result in complications if not addressed urgently such as respiratory distress, impairment of bodily function or dysfunction of bodily organs.   Routine symptom specific, illness specific and/or disease specific instructions were discussed with the patient and/or caregiver at length.   As such, the patient has been evaluated and assessed, work-up was performed and treatment was provided in alignment with urgent care protocols and evidence based medicine.  Patient/parent/caregiver has been advised that the patient may require follow up for further testing and treatment if the symptoms continue in spite of treatment, as clinically indicated and appropriate.  If the patient was tested for COVID-19, Influenza and/or RSV, then the patient/parent/guardian was advised to isolate at home pending the results of his/her diagnostic coronavirus test and potentially longer if they're positive. I have also  advised pt that if his/her COVID-19 test returns positive, it's recommended to self-isolate for at least 10 days after symptoms first appeared AND until fever-free for 24 hours without fever reducer AND other symptoms have improved or resolved. Discussed self-isolation recommendations as well as instructions for household member/close contacts as per the [facility] and [place] DHHS, and also gave patient the [place] packet with this information.  Patient/parent/caregiver has been advised to return to the [facility] or PCP in 3-5 days if no better; to PCP or the Emergency Department if new signs and symptoms develop, or if the current signs or symptoms continue to change or worsen for further workup, evaluation and treatment as clinically indicated and appropriate  The patient will follow up with their current PCP if and as advised. If the patient does not currently have a PCP we will assist them in obtaining one.   The patient may need specialty follow up if the symptoms continue, in spite of conservative treatment and management, for further workup, evaluation, consultation and treatment as clinically indicated and appropriate.  Patient/parent/caregiver verbalized understanding and agreement of plan as discussed.  All questions were addressed during visit.  Please see discharge instructions below for further details of plan.  Discharge Instructions:   Discharge Instructions      Your chest x-ray was not concerning for pneumonia.  You had meaningful improvement of your breath sounds after the albuterol treatment you received during your visit today.  At this time, I believe that you are having an acute exacerbation of a very remote history of asthma.  I believe it is very likely this was triggered by a viral respiratory infection.  Please read below to learn more about the medications, dosages and frequencies that I recommend to help alleviate your symptoms and to get you feeling better soon:   Zyrtec (cetirizine):  This is an excellent second-generation antihistamine that helps to reduce respiratory inflammatory response to environmental allergens.  In some patients, this medication can cause daytime sleepiness so I recommend that you take 1 tablet daily at bedtime.     ProAir, Ventolin, Proventil (albuterol): This inhaled medication contains a short acting beta agonist bronchodilator.  This medication works on the smooth muscle that opens and constricts of your airways by relaxing the muscle.  The result of relaxation of the smooth muscle is increased air movement and improved work of breathing.  This is a short acting medication that can be used every 4-6 hours as needed for increased work of breathing, shortness of breath, wheezing and excessive coughing.  I have provided you with a prescription.  Trelegy (fluticasone, vilanterol and umeclidinium):  This inhaled medication contains a corticosteroid and long-acting form of albuterol.  The inhaled steroid and this medication  is not absorbed into the body and will not cause side effects such as increased blood sugar levels, irritability, sleeplessness or weight gain.  Inhaled corticosteroid are sort of like topical steroid creams but, as you can imagine, it is not practical to attempt to rub a steroid cream inside of your lungs.  The long-acting albuterol works similarly to the short acting albuterol found in your rescue inhaler but provides 24-hour relaxation of the smooth muscles that open and constrict your airways; your short acting rescue inhaler can only provide for a few hours this benefit for a few hours.  The third unique ingredient, umeclidinium, is an antimuscarinic and works similarly to your albuterol, providing long-acting relaxation of the smooth muscles in your airway.  Please feel free to continue using your short acting rescue inhaler as often as needed throughout the day for shortness of breath, wheezing, and cough.    Please follow-up within the  next 7-10 days either with your primary care provider or urgent care if your symptoms do not resolve.     Thank you for visiting urgent care today.  We appreciate the opportunity to participate in your care.       This office note has been dictated using Museum/gallery curator.  Unfortunately, this method of dictation can sometimes lead to typographical or grammatical errors.  I apologize for your inconvenience in advance if this occurs.  Please do not hesitate to reach out to me if clarification is needed.

## 2022-02-01 NOTE — Progress Notes (Unsigned)
    Subjective:    Patient ID: Bryce Maldonado, male    DOB: 1940-02-26, 82 y.o.   MRN: [id number]      HPI Bryce Maldonado is here for No chief complaint on file.   He is here for an acute visit for cold symptoms.  His symptoms started  He is experiencing   He has tried taking      Medications and allergies reviewed with patient and updated if appropriate.  Current Outpatient Medications on File Prior to Visit  Medication Sig Dispense Refill   albuterol (VENTOLIN HFA) 108 (90 Base) MCG/ACT inhaler Inhale 2 puffs into the lungs every 6 (six) hours as needed for wheezing or shortness of breath (Cough). 18 g 2   allopurinol (ZYLOPRIM) 300 MG tablet TAKE 1 TABLET(300 MG) BY MOUTH DAILY 90 tablet 1   apixaban (ELIQUIS) 5 MG TABS tablet Take 1 tablet (5 mg total) by mouth 2 (two) times daily. 180 tablet 3   cetirizine (ZYRTEC ALLERGY) 10 MG tablet Take 1 tablet (10 mg total) by mouth at bedtime. 90 tablet 1   ezetimibe (ZETIA) 10 MG tablet TAKE 1 TABLET(10 MG) BY MOUTH AT BEDTIME 90 tablet 3   Fluticasone-Umeclidin-Vilant (TRELEGY ELLIPTA) 200-62.5-25 MCG/ACT AEPB Inhale 1 puff into the lungs in the morning for 14 days. 1 each 0   JARDIANCE 25 MG TABS tablet TAKE 1 TABLET(25 MG) BY MOUTH DAILY 90 tablet 2   losartan (COZAAR) 25 MG tablet TAKE 1 TABLET(25 MG) BY MOUTH DAILY 90 tablet 1   Melatonin 5 MG TABS Take 5 mg by mouth at bedtime.     metFORMIN (GLUCOPHAGE) 500 MG tablet TAKE 2 TABLETS(1000 MG) BY MOUTH TWICE DAILY WITH A MEAL 360 tablet 2   Multiple Vitamins-Minerals (CENTRUM PO) Take 1 tablet by mouth daily.     OVER THE COUNTER MEDICATION Take 3 capsules by mouth daily. Balance of [employer]     rosuvastatin (CRESTOR) 10 MG tablet TAKE 1 TABLET(10 MG) BY MOUTH AT BEDTIME 90 tablet 3   sodium chloride (OCEAN) 0.65 % SOLN nasal spray Place 1 spray into both nostrils at bedtime.     venlafaxine XR (EFFEXOR-XR) 37.5 MG 24 hr capsule TAKE 1 CAPSULE(37.5 MG) BY MOUTH  DAILY WITH BREAKFAST 90 capsule 3   No current facility-administered medications on file prior to visit.    Review of Systems     Objective:  There were no vitals filed for this visit. BP Readings from Last 3 Encounters:  01/27/22 135/72  12/20/21 124/70  08/04/21 120/60   Wt Readings from Last 3 Encounters:  12/20/21 188 lb (85.3 kg)  08/03/21 193 lb 12.6 oz (87.9 kg)  07/26/21 193 lb (87.5 kg)   There is no height or weight on file to calculate BMI.    Physical Exam         Assessment & Plan:    See Problem List for Assessment and Plan of chronic medical problems.

## 2022-02-02 ENCOUNTER — Encounter: Payer: Self-pay | Admitting: Internal Medicine

## 2022-02-02 ENCOUNTER — Ambulatory Visit (INDEPENDENT_AMBULATORY_CARE_PROVIDER_SITE_OTHER): Payer: Medicare PPO

## 2022-02-02 ENCOUNTER — Ambulatory Visit: Payer: Medicare PPO | Admitting: Internal Medicine

## 2022-02-02 VITALS — BP 118/78 | HR 101 | Temp 98.3°F | Ht 72.0 in | Wt 187.0 lb

## 2022-02-02 DIAGNOSIS — J209 Acute bronchitis, unspecified: Secondary | ICD-10-CM

## 2022-02-02 DIAGNOSIS — R0602 Shortness of breath: Secondary | ICD-10-CM | POA: Diagnosis not present

## 2022-02-02 DIAGNOSIS — I10 Essential (primary) hypertension: Secondary | ICD-10-CM

## 2022-02-02 DIAGNOSIS — J189 Pneumonia, unspecified organism: Secondary | ICD-10-CM | POA: Diagnosis not present

## 2022-02-02 DIAGNOSIS — R062 Wheezing: Secondary | ICD-10-CM | POA: Diagnosis not present

## 2022-02-02 DIAGNOSIS — E1159 Type 2 diabetes mellitus with other circulatory complications: Secondary | ICD-10-CM | POA: Diagnosis not present

## 2022-02-02 DIAGNOSIS — R059 Cough, unspecified: Secondary | ICD-10-CM | POA: Diagnosis not present

## 2022-02-02 MED ORDER — BENZONATATE 200 MG PO CAPS: 200.0000 mg | ORAL_CAPSULE | Freq: Three times a day (TID) | ORAL | 0 refills | Status: DC | PRN

## 2022-02-02 MED ORDER — HYDROCODONE BIT-HOMATROP MBR 5-1.5 MG/5ML PO SOLN: 5.0000 mL | Freq: Three times a day (TID) | ORAL | 0 refills | Status: DC | PRN

## 2022-02-02 MED ORDER — AZITHROMYCIN 250 MG PO TABS: ORAL_TABLET | ORAL | 0 refills | Status: DC

## 2022-02-02 MED ORDER — AMOXICILLIN-POT CLAVULANATE 875-125 MG PO TABS: 1.0000 | ORAL_TABLET | Freq: Two times a day (BID) | ORAL | 0 refills | Status: DC

## 2022-02-02 NOTE — Assessment & Plan Note (Signed)
Chronic Blood pressure is well-controlled Continue current medications-losartan 25 mg daily

## 2022-02-02 NOTE — Patient Instructions (Addendum)
      Have a chest xray today downstairs.     Medications changes include :   Augmentin twice daily x 10 days, cough syrup and tessalon perles for cough      Return if symptoms worsen or fail to improve.

## 2022-02-02 NOTE — Assessment & Plan Note (Signed)
Acute Symptoms concerning for possible community-acquired pneumonia Chest x-ray today does confirm this-bilateral lower lung pneumonia Augmentin 875-125 mg twice daily x 10 days, Z-Pak Deferred any steroids which I think is reasonable-can consider if symptoms or not improving Continue Trelegy inhaler, Coricidin cold products Start Hycodan cough syrup 5 mL every 8 hours as needed Continue rest and fluids, but stressed that he needs to be up and moving around Call if symptoms are not improving or worsening

## 2022-02-02 NOTE — Assessment & Plan Note (Signed)
Chronic Lab Results  Component Value Date   HGBA1C 7.3 (H) 12/20/2021   Sugars not well-controlled, but reasonably controlled We will hold off on steroids-he does have minimal wheezing, crackles at the bases of his lungs and will rule out pneumonia-no generalized wheeze If wheezing, chest tightness is not improving may need to consider very short course of steroids Continue current diabetic medications-metformin 1000 mg twice daily, Jardiance 25 mg daily

## 2022-02-14 ENCOUNTER — Other Ambulatory Visit: Payer: Self-pay | Admitting: Cardiology

## 2022-02-14 NOTE — Telephone Encounter (Signed)
Prescription refill request for Eliquis received. Indication:afib Last office visit:6/23 Scr:0.8 Age: 82 Weight:84.8  kg  Prescription refilled

## 2022-03-02 ENCOUNTER — Other Ambulatory Visit: Payer: Self-pay | Admitting: *Deleted

## 2022-03-02 MED ORDER — EZETIMIBE 10 MG PO TABS: ORAL_TABLET | ORAL | 0 refills | Status: DC

## 2022-03-09 NOTE — Progress Notes (Signed)
[place]. [address], [place]  [address] Phone: [phone] Fax:  [phone]  Date:  03/13/2022   ID:  Bryce Maldonado, DOB 1940/04/07, MRN [id number]  PCP:  Binnie Rail, MD  Cardiologist:  Dr. Caree Wolpert [place]     History of Present Illness: Bryce Maldonado is a 82 y.o. male is seen for follow up CAD and atrial flutter.   He has a hx of CAD, s/p CABG in 1995, HL, prostate CA, DJD, spinal stenosis. In 03/2010 he underwent a Lexiscan Myoview. This demonstrated an EF of 66% and inferior ischemia. LHC 01/2010: all grafts patent.  EF 60-65%. He was treated medically.   In 2016 he had new onset atrial flutter. He was anticoagulated and underwent Ablation by Dr. Rayann Heman on October 01, 2014. He had repeat ablation on Nov. 4 2016.  On his visit 2020 he was noted to be in atrial flutter with controlled rate. He was asymptomatic. Started on Eliquis. Treated with rate control only.   He did undergo lumbar L2-5 surgery in July.   He did have community acquired PNA at the end of December.   On follow up today he is doing well. Has recovered from his PNA. No chest pain, palpitations, dyspnea, dizziness. No bleeding. Stays active. Is walking regularly.     Wt Readings from Last 3 Encounters:  03/13/22 185 lb (83.9 kg)  02/02/22 187 lb (84.8 kg)  12/20/21 188 lb (85.3 kg)     Past Medical History:  Diagnosis Date   Anemia    Atrial fibrillation ([facility])    pt has had an ablation   BPH (benign prostatic hyperplasia)    Elevated PSA   Childhood asthma    Coronary artery disease    post CABG x4 -- in 1995 -- due to three-vessel coronary artery disease   Diverticular disease    Dyslipidemia    First degree AV block    Gout    Hypertension    Osteoarthritis    "everywhere" (12/04/2014)   Prostate cancer ([facility]) 01/30/2010   Dr Karsten Ro   Spinal stenosis 01/30/2002   L4 radiculopathy, herniated nucleus pulposus L4-5   Tinnitus    Type II diabetes mellitus ([facility])     Typical atrial flutter ([facility])    s/p ablation    Current Outpatient Medications  Medication Sig Dispense Refill   allopurinol (ZYLOPRIM) 300 MG tablet TAKE 1 TABLET(300 MG) BY MOUTH DAILY 90 tablet 1   cetirizine (ZYRTEC ALLERGY) 10 MG tablet Take 1 tablet (10 mg total) by mouth at bedtime. 90 tablet 1   ELIQUIS 5 MG TABS tablet TAKE 1 TABLET(5 MG) BY MOUTH TWICE DAILY 180 tablet 3   ezetimibe (ZETIA) 10 MG tablet TAKE 1 TABLET(10 MG) BY MOUTH AT BEDTIME 90 tablet 0   JARDIANCE 25 MG TABS tablet TAKE 1 TABLET(25 MG) BY MOUTH DAILY 90 tablet 2   losartan (COZAAR) 25 MG tablet TAKE 1 TABLET(25 MG) BY MOUTH DAILY 90 tablet 1   Melatonin 5 MG TABS Take 5 mg by mouth at bedtime.     metFORMIN (GLUCOPHAGE) 500 MG tablet TAKE 2 TABLETS(1000 MG) BY MOUTH TWICE DAILY WITH A MEAL 360 tablet 2   Multiple Vitamins-Minerals (CENTRUM PO) Take 1 tablet by mouth daily.     OVER THE COUNTER MEDICATION Take 3 capsules by mouth daily. Balance of Nature Fruits and Veggies     rosuvastatin (CRESTOR) 10 MG tablet TAKE 1 TABLET(10 MG) BY MOUTH AT BEDTIME  90 tablet 3   sodium chloride (OCEAN) 0.65 % SOLN nasal spray Place 1 spray into both nostrils at bedtime.     venlafaxine XR (EFFEXOR-XR) 37.5 MG 24 hr capsule TAKE 1 CAPSULE(37.5 MG) BY MOUTH DAILY WITH BREAKFAST 90 capsule 3   No current facility-administered medications for this visit.    Allergies:    Allergies  Allergen Reactions   Naproxen Sodium Rash    Social History:  The patient  reports that he quit smoking about 52 years ago. His smoking use included cigarettes. He has a 37.50 pack-year smoking history. He has never used smokeless tobacco. He reports that he does not currently use alcohol after a past usage of about 2.0 standard drinks of alcohol per week. He reports that he does not use drugs.   ROS:  Please see the history of present illness.      All other systems reviewed and negative.   PHYSICAL EXAM: VS:  BP 130/68   Pulse 82   Ht 5'  11" (1.803 m)   Wt 185 lb (83.9 kg)   SpO2 94%   BMI 25.80 kg/m  GENERAL:  Well appearing WM in NAD HEENT:  PERRL, EOMI, sclera are clear. Oropharynx is clear. NECK:  No jugular venous distention, carotid upstroke brisk and symmetric, no bruits, no thyromegaly or adenopathy LUNGS:  Clear to auscultation bilaterally CHEST:  Unremarkable HEART:  IRRR,  PMI not displaced or sustained,S1 and S2 within normal limits, no S3, no S4: no clicks, no rubs, gr 2/6 systolic murmur RUSB to apex. ABD:  Soft, nontender. BS +, no masses or bruits. No hepatomegaly, no splenomegaly EXT:  2 + pulses throughout, no edema, no cyanosis no clubbing SKIN:  Warm and dry.  No rashes NEURO:  Alert and oriented x 3. Cranial nerves II through XII intact. PSYCH:  Cognitively intact    Laboratory data:  Lab Results  Component Value Date   WBC 6.9 12/20/2021   HGB 16.6 12/20/2021   HCT 48.4 12/20/2021   PLT 295.0 12/20/2021   GLUCOSE 144 (H) 12/20/2021   CHOL 139 12/20/2021   TRIG 103.0 12/20/2021   HDL 56.40 12/20/2021   LDLDIRECT 86.2 06/24/2010   LDLCALC 62 12/20/2021   ALT 14 12/20/2021   AST 16 12/20/2021   NA 138 12/20/2021   K 4.5 12/20/2021   CL 99 12/20/2021   CREATININE 0.82 12/20/2021   BUN 19 12/20/2021   CO2 31 12/20/2021   TSH 0.99 12/20/2021   PSA 6.12 (H) 08/06/2006   INR 0.97 03/04/2010   HGBA1C 7.3 (H) 12/20/2021   MICROALBUR 1.0 12/20/2021    Ecg today shows Afib with rate 82 bpm. Nonspecific ST T abnormality. I have personally reviewed and interpreted this study.  Echo 12/31/18:IMPRESSIONS     1. Left ventricular ejection fraction, by visual estimation, is 70 to  75%. The left ventricle has hyperdynamic function. There is no left  ventricular hypertrophy.   2. Left ventricular diastolic function could not be evaluated.   3. Global right ventricle has normal systolic function.The right  ventricular size is normal. No increase in right ventricular wall  thickness.   4.  Left atrial size was mildly dilated.   5. Right atrial size was normal.   6. Moderate to severe aortic valve annular calcification.   7. Moderate to severe mitral annular calcification.   8. The mitral valve is grossly normal. Mild mitral valve regurgitation.   9. The tricuspid valve is normal in structure. Tricuspid  valve  regurgitation is mild.  10. The aortic valve has an indeterminant number of cusps. Aortic valve  regurgitation is mild.  11. The pulmonic valve was normal in structure. Pulmonic valve  regurgitation is trivial.  12. Aortic dilatation noted.  13. There is mild dilatation of the ascending aorta.  14. Normal pulmonary artery systolic pressure.  15. The atrial septum is grossly normal.    ASSESSMENT AND PLAN:  CAD: s/p CABG 1995.  He is active and asymptomatic.  He had a LHC in 2012 that demonstrated patent bypass grafts.  Myoview at the time showed inferior ischemia. Continue medical Rx.  Atrial flutter- s/p  Ablation x 2 in 2016. Recurrent Afibflutter since 2020. Rate is controlled and he is  asymptomatic. [place] vasc score of 4. Will continue Eliquis 5 mg bid.   3.  Hyperlipidemia:  At goal on statin. LDL 62.   4.   DM per primary care. A1c 7.3%  5.   HTN. Well controlled per patient report   I will follow up in one year.  Signed, Deagan Sevin [place] MD, [facility]

## 2022-03-13 ENCOUNTER — Encounter: Payer: Self-pay | Admitting: Cardiology

## 2022-03-13 ENCOUNTER — Ambulatory Visit: Payer: Medicare PPO | Attending: Cardiology | Admitting: Cardiology

## 2022-03-13 VITALS — BP 130/68 | HR 82 | Ht 71.0 in | Wt 185.0 lb

## 2022-03-13 DIAGNOSIS — I4819 Other persistent atrial fibrillation: Secondary | ICD-10-CM | POA: Diagnosis not present

## 2022-03-13 DIAGNOSIS — E782 Mixed hyperlipidemia: Secondary | ICD-10-CM | POA: Diagnosis not present

## 2022-03-13 DIAGNOSIS — I25708 Atherosclerosis of coronary artery bypass graft(s), unspecified, with other forms of angina pectoris: Secondary | ICD-10-CM | POA: Diagnosis not present

## 2022-03-13 DIAGNOSIS — I10 Essential (primary) hypertension: Secondary | ICD-10-CM | POA: Diagnosis not present

## 2022-03-13 MED ORDER — EZETIMIBE 10 MG PO TABS
ORAL_TABLET | ORAL | 0 refills | Status: AC
Start: 2022-03-13 — End: ?

## 2022-03-13 NOTE — Patient Instructions (Signed)
Medication Instructions:  No changes *If you need a refill on your cardiac medications before your next appointment, please call your pharmacy*  Follow-Up: At [facility], you and your health needs are our priority.  As part of our continuing mission to provide you with exceptional heart care, we have created designated Provider Care Teams.  These Care Teams include your primary Cardiologist (physician) and Advanced Practice Providers (APPs -  Physician Assistants and Nurse Practitioners) who all work together to provide you with the care you need, when you need it.  We recommend signing up for the patient portal called "MyChart".  Sign up information is provided on this After Visit Summary.  MyChart is used to connect with patients for Virtual Visits (Telemedicine).  Patients are able to view lab/test results, encounter notes, upcoming appointments, etc.  Non-urgent messages can be sent to your provider as well.   To learn more about what you can do with MyChart, go to [email or web address].    Your next appointment:   1 year(s)  Provider:   Peter [place], MD

## 2022-05-26 ENCOUNTER — Other Ambulatory Visit: Payer: Self-pay | Admitting: Internal Medicine

## 2022-05-30 ENCOUNTER — Other Ambulatory Visit: Payer: Self-pay | Admitting: *Deleted

## 2022-05-30 ENCOUNTER — Other Ambulatory Visit: Payer: Self-pay | Admitting: Internal Medicine

## 2022-05-30 MED ORDER — ROSUVASTATIN CALCIUM 10 MG PO TABS: ORAL_TABLET | ORAL | 3 refills | Status: DC

## 2022-06-19 ENCOUNTER — Encounter: Payer: Self-pay | Admitting: Internal Medicine

## 2022-06-19 NOTE — Progress Notes (Signed)
Subjective:    Patient ID: Bryce Maldonado, male    DOB: 11/17/1940, 82 y.o.   MRN: [id number]     HPI Commie is here for follow up of his chronic medical problems.  After standing or walking for a long time he has b/l feet pain.    A little exercising - can not walk like he used to.    Not compliant with a diabetic diet.     Medications and allergies reviewed with patient and updated if appropriate.  Current Outpatient Medications on File Prior to Visit  Medication Sig Dispense Refill   allopurinol (ZYLOPRIM) 300 MG tablet TAKE 1 TABLET(300 MG) BY MOUTH DAILY 90 tablet 1   cetirizine (ZYRTEC ALLERGY) 10 MG tablet Take 1 tablet (10 mg total) by mouth at bedtime. 90 tablet 1   ELIQUIS 5 MG TABS tablet TAKE 1 TABLET(5 MG) BY MOUTH TWICE DAILY 180 tablet 3   ezetimibe (ZETIA) 10 MG tablet TAKE 1 TABLET(10 MG) BY MOUTH AT BEDTIME 90 tablet 0   JARDIANCE 25 MG TABS tablet TAKE 1 TABLET(25 MG) BY MOUTH DAILY 90 tablet 2   losartan (COZAAR) 25 MG tablet TAKE 1 TABLET(25 MG) BY MOUTH DAILY 90 tablet 1   Melatonin 5 MG TABS Take 5 mg by mouth at bedtime.     metFORMIN (GLUCOPHAGE) 500 MG tablet TAKE 2 TABLETS(1000 MG) BY MOUTH TWICE DAILY WITH A MEAL 360 tablet 2   Multiple Vitamins-Minerals (CENTRUM PO) Take 1 tablet by mouth daily.     OVER THE COUNTER MEDICATION Take 3 capsules by mouth daily. Balance of Nature Fruits and Veggies     rosuvastatin (CRESTOR) 10 MG tablet TAKE 1 TABLET(10 MG) BY MOUTH AT BEDTIME 90 tablet 3   sodium chloride (OCEAN) 0.65 % SOLN nasal spray Place 1 spray into both nostrils at bedtime.     venlafaxine XR (EFFEXOR-XR) 37.5 MG 24 hr capsule TAKE 1 CAPSULE(37.5 MG) BY MOUTH DAILY WITH BREAKFAST 90 capsule 3   No current facility-administered medications on file prior to visit.     Review of Systems  Constitutional:  Negative for fever.  Respiratory:  Negative for cough, shortness of breath and wheezing.   Cardiovascular:  Negative for chest pain,  palpitations and leg swelling.  Neurological:  Negative for light-headedness and headaches.       Objective:   Vitals:   06/20/22 1007  BP: 116/68  Pulse: 66  Temp: 98.3 F (36.8 C)  SpO2: 93%   BP Readings from Last 3 Encounters:  06/20/22 116/68  03/13/22 130/68  02/02/22 118/78   Wt Readings from Last 3 Encounters:  06/20/22 179 lb (81.2 kg)  03/13/22 185 lb (83.9 kg)  02/02/22 187 lb (84.8 kg)   Body mass index is 24.97 kg/m.    Physical Exam Constitutional:      General: He is not in acute distress.    Appearance: Normal appearance. He is not ill-appearing.  HENT:     Head: Normocephalic and atraumatic.  Eyes:     Conjunctiva/sclera: Conjunctivae normal.  Cardiovascular:     Rate and Rhythm: Normal rate and regular rhythm.     Heart sounds: Normal heart sounds.  Pulmonary:     Effort: Pulmonary effort is normal. No respiratory distress.     Breath sounds: Normal breath sounds. No wheezing or rales.  Musculoskeletal:     Right lower leg: No edema.     Left lower leg: No edema.  Skin:  General: Skin is warm and dry.     Findings: No rash.  Neurological:     Mental Status: He is alert. Mental status is at baseline.  Psychiatric:        Mood and Affect: Mood normal.        Lab Results  Component Value Date   WBC 6.9 12/20/2021   HGB 16.6 12/20/2021   HCT 48.4 12/20/2021   PLT 295.0 12/20/2021   GLUCOSE 144 (H) 12/20/2021   CHOL 139 12/20/2021   TRIG 103.0 12/20/2021   HDL 56.40 12/20/2021   LDLDIRECT 86.2 06/24/2010   LDLCALC 62 12/20/2021   ALT 14 12/20/2021   AST 16 12/20/2021   NA 138 12/20/2021   K 4.5 12/20/2021   CL 99 12/20/2021   CREATININE 0.82 12/20/2021   BUN 19 12/20/2021   CO2 31 12/20/2021   TSH 0.99 12/20/2021   PSA 6.12 (H) 08/06/2006   INR 0.97 03/04/2010   HGBA1C 7.3 (H) 12/20/2021   MICROALBUR 1.0 12/20/2021     Assessment & Plan:    See Problem List for Assessment and Plan of chronic medical problems.

## 2022-06-19 NOTE — Patient Instructions (Addendum)
      Blood work was ordered.   The lab is on the first floor.    Medications changes include :   none      Return in about 6 months (around 12/21/2022) for Physical Exam.  

## 2022-06-20 ENCOUNTER — Ambulatory Visit: Payer: Medicare PPO | Admitting: Internal Medicine

## 2022-06-20 VITALS — BP 116/68 | HR 66 | Temp 98.3°F | Ht 71.0 in | Wt 179.0 lb

## 2022-06-20 DIAGNOSIS — F419 Anxiety disorder, unspecified: Secondary | ICD-10-CM

## 2022-06-20 DIAGNOSIS — E1159 Type 2 diabetes mellitus with other circulatory complications: Secondary | ICD-10-CM

## 2022-06-20 DIAGNOSIS — M79672 Pain in left foot: Secondary | ICD-10-CM

## 2022-06-20 DIAGNOSIS — E782 Mixed hyperlipidemia: Secondary | ICD-10-CM

## 2022-06-20 DIAGNOSIS — I480 Paroxysmal atrial fibrillation: Secondary | ICD-10-CM

## 2022-06-20 DIAGNOSIS — Z7984 Long term (current) use of oral hypoglycemic drugs: Secondary | ICD-10-CM

## 2022-06-20 DIAGNOSIS — M79671 Pain in right foot: Secondary | ICD-10-CM | POA: Diagnosis not present

## 2022-06-20 DIAGNOSIS — I10 Essential (primary) hypertension: Secondary | ICD-10-CM

## 2022-06-20 DIAGNOSIS — M1A9XX0 Chronic gout, unspecified, without tophus (tophi): Secondary | ICD-10-CM | POA: Diagnosis not present

## 2022-06-20 DIAGNOSIS — G8929 Other chronic pain: Secondary | ICD-10-CM | POA: Diagnosis not present

## 2022-06-20 DIAGNOSIS — M5440 Lumbago with sciatica, unspecified side: Secondary | ICD-10-CM

## 2022-06-20 LAB — COMPREHENSIVE METABOLIC PANEL
ALT: 11 U/L (ref 0–53)
AST: 14 U/L (ref 0–37)
Albumin: 4.7 g/dL (ref 3.5–5.2)
Alkaline Phosphatase: 52 U/L (ref 39–117)
BUN: 27 mg/dL — ABNORMAL HIGH (ref 6–23)
CO2: 25 mEq/L (ref 19–32)
Calcium: 10 mg/dL (ref 8.4–10.5)
Chloride: 101 mEq/L (ref 96–112)
Creatinine, Ser: 0.75 mg/dL (ref 0.40–1.50)
GFR: 85.12 mL/min (ref >60.00)
Glucose, Bld: 135 mg/dL — ABNORMAL HIGH (ref 70–99)
Potassium: 4.4 mEq/L (ref 3.5–5.1)
Sodium: 136 mEq/L (ref 135–145)
Total Bilirubin: 0.7 mg/dL (ref 0.2–1.2)
Total Protein: 8.1 g/dL (ref 6.0–8.3)

## 2022-06-20 LAB — CBC WITH DIFFERENTIAL/PLATELET
Basophils Absolute: 0.1 10*3/uL (ref 0.0–0.1)
Basophils Relative: 1.2 % (ref 0.0–3.0)
Eosinophils Absolute: 0.2 10*3/uL (ref 0.0–0.7)
Eosinophils Relative: 2.7 % (ref 0.0–5.0)
HCT: 46.7 % (ref 39.0–52.0)
Hemoglobin: 15.6 g/dL (ref 13.0–17.0)
Lymphocytes Relative: 16.9 % (ref 12.0–46.0)
Lymphs Abs: 1 10*3/uL (ref 0.7–4.0)
MCHC: 33.3 g/dL (ref 30.0–36.0)
MCV: 90.4 fl (ref 78.0–100.0)
Monocytes Absolute: 0.7 10*3/uL (ref 0.1–1.0)
Monocytes Relative: 12.2 % — ABNORMAL HIGH (ref 3.0–12.0)
Neutro Abs: 4 10*3/uL (ref 1.4–7.7)
Neutrophils Relative %: 67 % (ref 43.0–77.0)
Platelets: 275 10*3/uL (ref 150.0–400.0)
RBC: 5.16 Mil/uL (ref 4.22–5.81)
RDW: 15.1 % (ref 11.5–15.5)
WBC: 6 10*3/uL (ref 4.0–10.5)

## 2022-06-20 LAB — URIC ACID: Uric Acid, Serum: 4.5 mg/dL (ref 4.0–7.8)

## 2022-06-20 LAB — LIPID PANEL
Cholesterol: 137 mg/dL (ref 0–200)
HDL: 51.1 mg/dL (ref >39.00)
LDL Cholesterol: 61 mg/dL (ref 0–99)
NonHDL: 86.34
Total CHOL/HDL Ratio: 3
Triglycerides: 125 mg/dL (ref 0.0–149.0)
VLDL: 25 mg/dL (ref 0.0–40.0)

## 2022-06-20 LAB — HEMOGLOBIN A1C: Hgb A1c MFr Bld: 7.5 % — ABNORMAL HIGH (ref 4.6–6.5)

## 2022-06-20 NOTE — Assessment & Plan Note (Signed)
Chronic Regular exercise and healthy diet encouraged Check lipid panel  Continue Crestor 10 mg daily, Zetia 10 mg daily 

## 2022-06-20 NOTE — Assessment & Plan Note (Signed)
Chronic Following with cardiology On Eliquis 5 mg twice daily Rate controlled without medication CBC, CMP

## 2022-06-20 NOTE — Assessment & Plan Note (Signed)
Chronic Controlled No recent gout attacks Check uric acid level Continue allopurinol 300 mg daily

## 2022-06-20 NOTE — Assessment & Plan Note (Addendum)
Chronic Limits his walking Has seen podiatry in the past ? Related to chronic back pain - spinal stenosis Deferred referral for podiatry for re-eval

## 2022-06-20 NOTE — Assessment & Plan Note (Signed)
Chronic Lab Results  Component Value Date   HGBA1C 7.3 (H) 12/20/2021   Sugars not ideally controlled Check A1c today Continue current diabetic medications-metformin 1000 mg twice daily, Jardiance 25 mg daily If A1c above 7 will add low-dose glimepiride or Rybelsus Stressed diabetic diet, exercise

## 2022-06-20 NOTE — Assessment & Plan Note (Signed)
Chronic Blood pressure is well-controlled CBC, CMP Continue current medications-losartan 25 mg daily

## 2022-06-20 NOTE — Assessment & Plan Note (Signed)
Chronic Controlled, Stable Continue Effexor XR 37.5 mg daily 

## 2022-06-20 NOTE — Assessment & Plan Note (Signed)
Chronic Following with Dr. Lovell Sheehan who did surgery on him previously Has chronic lower back pain and intermittent sciatica which can be severe MRI done last year Completed 6 weeks of PT without much improvement in pain This is disturbing his balance, strength and mobility

## 2022-06-21 ENCOUNTER — Encounter: Payer: Self-pay | Admitting: Internal Medicine

## 2022-06-21 MED ORDER — GLIMEPIRIDE 2 MG PO TABS: 2.0000 mg | ORAL_TABLET | Freq: Every day | ORAL | 1 refills | Status: DC

## 2022-07-24 ENCOUNTER — Telehealth: Payer: Self-pay | Admitting: Internal Medicine

## 2022-07-24 NOTE — Telephone Encounter (Signed)
Spoke with the wife and she states they have started it today and will call back if they dont feel better

## 2022-07-24 NOTE — Telephone Encounter (Signed)
Patient's wife Bryce Maldonado called and said patient is sick and has a cough. She said she's not able to bring him in for a couple of days, but would like a recommendation for an OTC cough medication until then. Best callback is [phone] for Bryce Maldonado or [phone] for patient.

## 2022-07-24 NOTE — Telephone Encounter (Signed)
Mucinex, coridin cold products

## 2022-07-25 ENCOUNTER — Encounter: Payer: Self-pay | Admitting: Internal Medicine

## 2022-07-25 NOTE — Patient Instructions (Signed)
    You have covid.    Have a chest xray downstairs.   Medications changes include :   paxlovid, cough syrup   Hold crestor while on paxlovid - then start Decrease eliquis to 2.5 mg twice a day for 5 days while on paxlovid     Return if symptoms worsen or fail to improve.

## 2022-07-25 NOTE — Progress Notes (Unsigned)
    Subjective:    Patient ID: Bryce Maldonado, male    DOB: 02-14-1940, 82 y.o.   MRN: [id number]      HPI Bryce Maldonado is here for No chief complaint on file.   He is here for an acute visit for cold symptoms.  His symptoms started  He is experiencing   He has tried taking      Medications and allergies reviewed with patient and updated if appropriate.  Current Outpatient Medications on File Prior to Visit  Medication Sig Dispense Refill   glimepiride (AMARYL) 2 MG tablet Take 1 tablet (2 mg total) by mouth daily before breakfast. 90 tablet 1   allopurinol (ZYLOPRIM) 300 MG tablet TAKE 1 TABLET(300 MG) BY MOUTH DAILY 90 tablet 1   cetirizine (ZYRTEC ALLERGY) 10 MG tablet Take 1 tablet (10 mg total) by mouth at bedtime. 90 tablet 1   ELIQUIS 5 MG TABS tablet TAKE 1 TABLET(5 MG) BY MOUTH TWICE DAILY 180 tablet 3   ezetimibe (ZETIA) 10 MG tablet TAKE 1 TABLET(10 MG) BY MOUTH AT BEDTIME 90 tablet 0   JARDIANCE 25 MG TABS tablet TAKE 1 TABLET(25 MG) BY MOUTH DAILY 90 tablet 2   losartan (COZAAR) 25 MG tablet TAKE 1 TABLET(25 MG) BY MOUTH DAILY 90 tablet 1   Melatonin 5 MG TABS Take 5 mg by mouth at bedtime.     metFORMIN (GLUCOPHAGE) 500 MG tablet TAKE 2 TABLETS(1000 MG) BY MOUTH TWICE DAILY WITH A MEAL 360 tablet 2   Multiple Vitamins-Minerals (CENTRUM PO) Take 1 tablet by mouth daily.     OVER THE COUNTER MEDICATION Take 3 capsules by mouth daily. Balance of Nature Fruits and Veggies     rosuvastatin (CRESTOR) 10 MG tablet TAKE 1 TABLET(10 MG) BY MOUTH AT BEDTIME 90 tablet 3   sodium chloride (OCEAN) 0.65 % SOLN nasal spray Place 1 spray into both nostrils at bedtime.     venlafaxine XR (EFFEXOR-XR) 37.5 MG 24 hr capsule TAKE 1 CAPSULE(37.5 MG) BY MOUTH DAILY WITH BREAKFAST 90 capsule 3   No current facility-administered medications on file prior to visit.    Review of Systems     Objective:  There were no vitals filed for this visit. BP Readings from Last 3 Encounters:   06/20/22 116/68  03/13/22 130/68  02/02/22 118/78   Wt Readings from Last 3 Encounters:  06/20/22 179 lb (81.2 kg)  03/13/22 185 lb (83.9 kg)  02/02/22 187 lb (84.8 kg)   There is no height or weight on file to calculate BMI.    Physical Exam         Assessment & Plan:    See Problem List for Assessment and Plan of chronic medical problems.

## 2022-07-26 ENCOUNTER — Ambulatory Visit: Payer: Medicare PPO | Admitting: Internal Medicine

## 2022-07-26 ENCOUNTER — Ambulatory Visit (INDEPENDENT_AMBULATORY_CARE_PROVIDER_SITE_OTHER): Payer: Medicare PPO

## 2022-07-26 VITALS — BP 118/70 | HR 111 | Temp 98.0°F | Ht 71.0 in | Wt 179.8 lb

## 2022-07-26 DIAGNOSIS — E1140 Type 2 diabetes mellitus with diabetic neuropathy, unspecified: Secondary | ICD-10-CM | POA: Insufficient documentation

## 2022-07-26 DIAGNOSIS — R0602 Shortness of breath: Secondary | ICD-10-CM | POA: Diagnosis not present

## 2022-07-26 DIAGNOSIS — R059 Cough, unspecified: Secondary | ICD-10-CM | POA: Diagnosis not present

## 2022-07-26 DIAGNOSIS — U071 COVID-19: Secondary | ICD-10-CM

## 2022-07-26 DIAGNOSIS — Z7984 Long term (current) use of oral hypoglycemic drugs: Secondary | ICD-10-CM | POA: Diagnosis not present

## 2022-07-26 DIAGNOSIS — R051 Acute cough: Secondary | ICD-10-CM

## 2022-07-26 DIAGNOSIS — E1142 Type 2 diabetes mellitus with diabetic polyneuropathy: Secondary | ICD-10-CM | POA: Diagnosis not present

## 2022-07-26 DIAGNOSIS — R410 Disorientation, unspecified: Secondary | ICD-10-CM

## 2022-07-26 LAB — POC COVID19 BINAXNOW: SARS Coronavirus 2 Ag: POSITIVE — AB

## 2022-07-26 MED ORDER — NIRMATRELVIR/RITONAVIR (PAXLOVID)TABLET: 3.0000 | ORAL_TABLET | Freq: Two times a day (BID) | ORAL | 0 refills | Status: AC

## 2022-07-26 MED ORDER — APIXABAN 2.5 MG PO TABS: 2.5000 mg | ORAL_TABLET | Freq: Two times a day (BID) | ORAL | 0 refills | Status: DC

## 2022-07-26 MED ORDER — HYDROCODONE BIT-HOMATROP MBR 5-1.5 MG/5ML PO SOLN: 5.0000 mL | Freq: Three times a day (TID) | ORAL | 0 refills | Status: DC | PRN

## 2022-07-26 NOTE — Assessment & Plan Note (Signed)
Acute Symptoms started 4-5 days ago He might be out of the window for Paxlovid, but I think the benefits of Paxlovid may outweigh any risks or not benefit.  He is experiencing some mild confusion, slowness and significant weakness related to COVID Will start Paxlovid 3 tabs twice daily x 5 days Hold Crestor, decrease Eliquis to 2.5 mg twice daily while on Paxlovid Hycodan cough syrup at night as needed Discussed with his wife that if he is not improving or symptoms are getting worse he will need to go to the emergency room Chest x-ray today showed no pneumonia

## 2022-07-26 NOTE — Assessment & Plan Note (Signed)
Chronic Had some questions regarding his feet pain He has been diagnosed with diabetic neuropathy in the past Discussed that we can start him on gabapentin if needed, but he prefers not to take any new medication

## 2022-08-28 ENCOUNTER — Other Ambulatory Visit: Payer: Self-pay | Admitting: Internal Medicine

## 2022-08-29 ENCOUNTER — Other Ambulatory Visit: Payer: Self-pay | Admitting: Internal Medicine

## 2022-10-31 ENCOUNTER — Other Ambulatory Visit: Payer: Self-pay

## 2022-10-31 MED ORDER — EZETIMIBE 10 MG PO TABS: ORAL_TABLET | ORAL | 1 refills | Status: DC

## 2022-11-07 ENCOUNTER — Other Ambulatory Visit: Payer: Self-pay | Admitting: Internal Medicine

## 2022-11-13 DIAGNOSIS — H0102A Squamous blepharitis right eye, upper and lower eyelids: Secondary | ICD-10-CM | POA: Diagnosis not present

## 2022-11-13 DIAGNOSIS — H40013 Open angle with borderline findings, low risk, bilateral: Secondary | ICD-10-CM | POA: Diagnosis not present

## 2022-11-13 DIAGNOSIS — H0102B Squamous blepharitis left eye, upper and lower eyelids: Secondary | ICD-10-CM | POA: Diagnosis not present

## 2022-11-13 DIAGNOSIS — Z961 Presence of intraocular lens: Secondary | ICD-10-CM | POA: Diagnosis not present

## 2022-11-14 ENCOUNTER — Ambulatory Visit (INDEPENDENT_AMBULATORY_CARE_PROVIDER_SITE_OTHER): Payer: Medicare PPO

## 2022-11-14 VITALS — Ht 71.0 in | Wt 181.0 lb

## 2022-11-14 DIAGNOSIS — Z0000 Encounter for general adult medical examination without abnormal findings: Secondary | ICD-10-CM

## 2022-11-14 NOTE — Patient Instructions (Signed)
Bryce Maldonado , Thank you for taking time to come for your Medicare Wellness Visit. I appreciate your ongoing commitment to your health goals. Please review the following plan we discussed and let me know if I can assist you in the future.   Referrals/Orders/Follow-Ups/Clinician Recommendations: You are due for a Flu, Shingrix and Covid vaccine.  It was nice speaking with you today.  Keep up the good work.  Each day, aim for 6 glasses of water, plenty of protein in your diet and try to get up and walk/ stretch every hour for 5-10 minutes at a time.    This is a list of the screening recommended for you and due dates:  Health Maintenance  Topic Date Due   Zoster (Shingles) Vaccine (1 of 2) 10/09/1991   Complete foot exam   04/03/2019   Flu Shot  08/31/2022   COVID-19 Vaccine (7 - 2023-24 season) 10/01/2022   Eye exam for diabetics  11/08/2022   Yearly kidney health urinalysis for diabetes  12/21/2022   Hemoglobin A1C  12/21/2022   Yearly kidney function blood test for diabetes  06/20/2023   Medicare Annual Wellness Visit  11/14/2023   DTaP/Tdap/Td vaccine (2 - Td or Tdap) 08/12/2024   Pneumonia Vaccine  Completed   HPV Vaccine  Aged Out   Hepatitis C Screening  Discontinued    Advanced directives: (Copy Requested) Please bring a copy of your health care power of attorney and living will to the office to be added to your chart at your convenience.  Next Medicare Annual Wellness Visit scheduled for next year: Yes

## 2022-11-14 NOTE — Progress Notes (Signed)
Subjective:   Bryce Maldonado is a 82 y.o. male who presents for Medicare Annual/Subsequent preventive examination.  Visit Complete: Virtual I connected with  Bryce Maldonado on 11/14/22 by a audio enabled telemedicine application and verified that I am speaking with the correct person using two identifiers.  Patient Location: Home  Provider Location: Home Office  I discussed the limitations of evaluation and management by telemedicine. The patient expressed understanding and agreed to proceed.  Vital Signs: Because this visit was a virtual/telehealth visit, some criteria may be missing or patient reported. Any vitals not documented were not able to be obtained and vitals that have been documented are patient reported.  Because this visit was a virtual/telehealth visit, some criteria may be missing or patient reported. Any vitals not documented were not able to be obtained and vitals that have been documented are patient reported.    Cardiac Risk Factors include: advanced age (>8men, >61 women);male gender;hypertension;diabetes mellitus;dyslipidemia;Other (see comment), Risk factor comments: PAF, Prostate cancer, SVT     Objective:    Today's Vitals   11/14/22 1433  Weight: 181 lb (82.1 kg)  Height: 5\' 11"  (1.803 m)   Body mass index is 25.24 kg/m.     11/14/2022    2:44 PM 12/12/2021   10:46 AM 08/03/2021    3:00 PM 07/26/2021    1:18 PM 12/01/2020    9:07 AM 12/20/2018    9:56 AM 03/29/2017   10:18 AM  Advanced Directives  Does Patient Have a Medical Advance Directive? Yes Yes Yes Yes Yes Yes Yes  Type of Estate agent of [place];Living will Healthcare Power of [place];Living will Healthcare Power of [place];Living will Healthcare Power of [place];Living will Living will;Healthcare Power of [employer] Power of [place];Living will  Does patient want to make changes to medical advance directive?  No - Patient declined No - Patient  declined No - Patient declined No - Patient declined    Copy of Healthcare Power of Attorney in Chart? No - copy requested No - copy requested No - copy requested No - copy requested No - copy requested  No - copy requested    Current Medications (verified) Outpatient Encounter Medications as of 11/14/2022  Medication Sig   allopurinol (ZYLOPRIM) 300 MG tablet TAKE 1 TABLET(300 MG) BY MOUTH DAILY   ELIQUIS 5 MG TABS tablet TAKE 1 TABLET(5 MG) BY MOUTH TWICE DAILY   ezetimibe (ZETIA) 10 MG tablet TAKE 1 TABLET(10 MG) BY MOUTH AT BEDTIME   glimepiride (AMARYL) 2 MG tablet Take 1 tablet (2 mg total) by mouth daily before breakfast.   JARDIANCE 25 MG TABS tablet TAKE 1 TABLET(25 MG) BY MOUTH DAILY   losartan (COZAAR) 25 MG tablet TAKE 1 TABLET(25 MG) BY MOUTH DAILY   Melatonin 5 MG TABS Take 5 mg by mouth at bedtime.   metFORMIN (GLUCOPHAGE) 500 MG tablet TAKE 2 TABLETS(1000 MG) BY MOUTH TWICE DAILY WITH A MEAL   Multiple Vitamins-Minerals (CENTRUM PO) Take 1 tablet by mouth daily.   OVER THE COUNTER MEDICATION Take 3 capsules by mouth daily. Balance of Nature Fruits and Veggies   rosuvastatin (CRESTOR) 10 MG tablet TAKE 1 TABLET(10 MG) BY MOUTH AT BEDTIME   sodium chloride (OCEAN) 0.65 % SOLN nasal spray Place 1 spray into both nostrils at bedtime.   venlafaxine XR (EFFEXOR-XR) 37.5 MG 24 hr capsule TAKE 1 CAPSULE(37.5 MG) BY MOUTH DAILY WITH BREAKFAST   apixaban (ELIQUIS) 2.5 MG TABS tablet Take 1 tablet (  2.5 mg total) by mouth 2 (two) times daily for 5 days.   cetirizine (ZYRTEC ALLERGY) 10 MG tablet Take 1 tablet (10 mg total) by mouth at bedtime. (Patient not taking: Reported on 07/26/2022)   HYDROcodone bit-homatropine (HYCODAN) 5-1.5 MG/5ML syrup Take 5 mLs by mouth every 8 (eight) hours as needed for cough. (Patient not taking: Reported on 11/14/2022)   No facility-administered encounter medications on file as of 11/14/2022.    Allergies (verified) Naproxen sodium   History: Past  Medical History:  Diagnosis Date   Anemia    Atrial fibrillation (HCC)    pt has had an ablation   BPH (benign prostatic hyperplasia)    Elevated PSA   Childhood asthma    Coronary artery disease    post CABG x4 -- in 1995 -- due to three-vessel coronary artery disease   Diverticular disease    Dyslipidemia    First degree AV block    Gout    Hypertension    Osteoarthritis    "everywhere" (12/04/2014)   Prostate cancer (HCC) 01/30/2010   [name]   Spinal stenosis 01/30/2002   L4 radiculopathy, herniated nucleus pulposus L4-5   Tinnitus    Type II diabetes mellitus (HCC)    Typical atrial flutter (HCC)    s/p ablation   Past Surgical History:  Procedure Laterality Date   APPENDECTOMY  1943   ATRIAL FLUTTER ABLATION  12/04/2014   BACK SURGERY     CARDIAC CATHETERIZATION  02/10/2010   Est. EF of 60-65% -- Severe three-vessel obstructive atherosclerotic coronary artery disease -- All grafts were patent including left internal mammary artery graft to left anterior descending coronary artery, saphenous vein graft to the diagonal, saphenous vein graft to obtuse marginal vessel, and saphenous vein graft to the posterior descending coronary artery -- Normal left ventricular function    CATARACT EXTRACTION Bilateral 2023   COLONOSCOPY     negative X3; Dr Leone Payor   CORONARY ARTERY BYPASS GRAFT  10/21/1993   x4 -- using the LIM artery graft to the LAD artery, with saphenous vein grafts to the diagonal branch of the LAD, OM branch the left circumflex coronary artery, and the posterior descending branch of the RCA -- Est. EF of 65%-- Surgeon: Alleen Borne, M.D.               ELECTROPHYSIOLOGIC STUDY N/A 10/01/2014   CTI ablation by Dr Elberta Fortis   ELECTROPHYSIOLOGIC STUDY N/A 12/04/2014   repeat CTI ablation by [name]   FLEXIBLE SIGMOIDOSCOPY  01/31/2003    external hemorrhoids   JOINT REPLACEMENT     LUMBAR LAMINECTOMY/DECOMPRESSION MICRODISCECTOMY Bilateral 08/03/2021    Procedure: Lumbar two-three, Lumbar three-four, Lumbar four-five BILATERAL LAMINOTOMY/FORAMINOTOMY;  Surgeon: Tressie Stalker, MD;  Location[facility] OR;  Service: Neurosurgery;  Laterality: Bilateral;   MICRODISCECTOMY LUMBAR  01/30/2002   L4-5, Dr. Lovell Sheehan   PROSTATE BIOPSY  10/30/2008   "had radiation tx for prostate cancer"   SALIVARY STONE REMOVAL  01/31/1999   TOTAL HIP ARTHROPLASTY Right 11/30/2008   TOTAL HIP ARTHROPLASTY Left 03/02/2010   Dr Charlann Boxer   Family History  Problem Relation Age of Onset   Diabetes Father    Hypertension Father    Lung cancer Father        smoker   Heart failure Mother 80   Hyperlipidemia Sister    Coronary artery disease Sister    Heart attack Paternal Aunt        X 2; both > 65  Colon cancer Neg Hx    Stroke Neg Hx    Social History   Socioeconomic History   Marital status: Married    Spouse name: Marylene Land   Number of children: 2   Years of education: 16   Highest education level: Not on file  Occupational History   Occupation: retired  Tobacco Use   Smoking status: Former    Current packs/day: 0.00    Average packs/day: 2.5 packs/day for 15.0 years (37.5 ttl pk-yrs)    Types: Cigarettes    Start date: 01/31/1955    Quit date: 01/30/1970    Years since quitting: 52.8   Smokeless tobacco: Never   Tobacco comments:    smoked 1959-1972  Vaping Use   Vaping status: Never Used  Substance and Sexual Activity   Alcohol use: Not Currently    Alcohol/week: 2.0 standard drinks of alcohol    Types: 1 Cans of beer, 1 Standard drinks or equivalent per week    Comment: maybe a beer or 1 liquor drink once every 2 weeks   Drug use: No   Sexual activity: Not Currently  Other Topics Concern   Not on file  Social History Narrative   Pt lives in [place] with spouse. 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  Enjoys reading and walking.   Left handed   Town house   Social Determinants of Health   Financial Resource Strain: Low Risk  (11/14/2022)    Overall Financial Resource Strain (CARDIA)    Difficulty of Paying Living Expenses: Not hard at all  Food Insecurity: No Food Insecurity (11/14/2022)   Hunger Vital Sign    Worried About Running Out of Food in the Last Year: Never true    Ran Out of Food in the Last Year: Never true  Transportation Needs: No Transportation Needs (11/14/2022)   PRAPARE - Administrator, Civil Service (Medical): No    Lack of Transportation (Non-Medical): No  Physical Activity: Sufficiently Active (11/14/2022)   Exercise Vital Sign    Days of Exercise per Week: 7 days    Minutes of Exercise per Session: 30 min  Stress: No Stress Concern Present (11/14/2022)   [employer] of Occupational Health - Occupational Stress Questionnaire    Feeling of Stress : Not at all  Social Connections: Moderately Isolated (11/14/2022)   Social Connection and Isolation Panel [NHANES]    Frequency of Communication with Friends and Family: More than three times a week    Frequency of Social Gatherings with Friends and Family: Twice a week    Attends Religious Services: Never    Database administrator or Organizations: No    Attends Engineer, structural: Never    Marital Status: Married    Tobacco Counseling Counseling given: Not Answered Tobacco comments: smoked 1959-1972   Clinical Intake:  Pre-visit preparation completed: Yes  Pain : No/denies pain     BMI - recorded: 25.24 Nutritional Status: BMI 25 -29 Overweight Nutritional Risks: None Diabetes: Yes CBG done?: No Did pt. bring in CBG monitor from home?: No  How often do you need to have someone help you when you read instructions, pamphlets, or other written materials from your doctor or pharmacy?: 1 - Never  Interpreter Needed?: No  Information entered by :: Harinder Romas, RMA   Activities of Daily Living    11/14/2022    2:35 PM 12/12/2021   10:47 AM  In your present state of health, do you have any difficulty  performing the following activities:  Hearing? 0 0  Vision? 0 0  Difficulty concentrating or making decisions? 0 0  Walking or climbing stairs? 0 0  Dressing or bathing? 0 0  Doing errands, shopping? 0 0  Preparing Food and eating ? N N  Using the Toilet? N N  In the past six months, have you accidently leaked urine? N N  Do you have problems with loss of bowel control? N N  Managing your Medications? N N  Managing your Finances? N N  Housekeeping or managing your Housekeeping? N N    Patient Care Team: [name], MD as PCP - General (Internal Medicine) [place], Peter M, MD as PCP - Cardiology (Cardiology) Glendale Chard, DO as Consulting Physician (Neurology) [facility]  Indicate any recent Medical Services you may have received from other than [facility] providers in the past year (date may be approximate).     Assessment:   This is a routine wellness examination for Bryce Maldonado.  Hearing/Vision screen Hearing Screening - Comments:: Denies hearing difficulties     Goals Addressed               This Visit's Progress     Patient Stated (pt-stated)        Would like his feet to stop hurting.      Depression Screen    11/14/2022    2:51 PM 07/26/2022    8:41 AM 02/02/2022    9:38 AM 12/12/2021   10:45 AM 06/01/2021    9:39 AM 12/01/2020    9:05 AM 04/26/2020    9:18 AM  PHQ 2/9 Scores  PHQ - 2 Score 0 0 0 0 0 0 0  PHQ- 9 Score 0  3    0    Fall Risk    11/14/2022    2:44 PM 07/26/2022    8:41 AM 02/02/2022    9:15 AM 12/20/2021   10:00 AM 12/12/2021   10:46 AM  Fall Risk   Falls in the past year? 1 1 0 0 1  Number falls in past yr: 0 1 0 0 1  Injury with Fall? 0 0 0 0 0  Risk for fall due to : No Fall Risks No Fall Risks No Fall Risks No Fall Risks History of fall(s)  Follow up Falls prevention discussed;Falls evaluation completed Falls evaluation completed Falls evaluation completed Falls evaluation completed Falls evaluation completed     MEDICARE RISK AT HOME: Medicare Risk at Home Any stairs in or around the home?: Yes Home free of loose throw rugs in walkways, pet beds, electrical cords, etc?: Yes Adequate lighting in your home to reduce risk of falls?: Yes Life alert?: No Use of a cane, walker or w/c?: No Grab bars in the bathroom?: Yes Shower chair or bench in shower?: Yes Elevated toilet seat or a handicapped toilet?: Yes  TIMED UP AND GO:  Was the test performed?  No    Cognitive Function:        11/14/2022    2:46 PM 12/12/2021   10:48 AM  6CIT Screen  What Year? 0 points 0 points  What month? 0 points 0 points  What time? 0 points 0 points  Count back from 20 0 points 0 points  Months in reverse 0 points 0 points  Repeat phrase 2 points 0 points  Total Score 2 points 0 points    Immunizations Immunization History  Administered Date(s) Administered   Fluad Quad(high Dose  65+) 10/24/2018, 10/27/2019, 11/24/2020   Influenza Whole 11/11/2007, 11/03/2008   Influenza, High Dose Seasonal PF 12/04/2013, 09/27/2015, 10/02/2017   Influenza-Unspecified 11/08/2012, 11/03/2014, 10/15/2015, 11/03/2021   PFIZER(Purple Top)SARS-COV-2 Vaccination 02/20/2019, 03/13/2019, 11/15/2019, 05/19/2020   Pfizer Covid-19 Vaccine Bivalent Booster 59yrs & up 11/04/2020   Pneumococcal Conjugate-13 11/03/2014   Pneumococcal Polysaccharide-23 04/09/2013   Tdap 08/13/2014   Unspecified SARS-COV-2 Vaccination 11/03/2021   Zoster, Live 06/24/2010    TDAP status: Up to date  Flu Vaccine status: Due, Education has been provided regarding the importance of this vaccine. Advised may receive this vaccine at local pharmacy or Health Dept. Aware to provide a copy of the vaccination record if obtained from local pharmacy or Health Dept. Verbalized acceptance and understanding.  Pneumococcal vaccine status: Up to date  Covid-19 vaccine status: Information provided on how to obtain vaccines.   Qualifies for Shingles Vaccine?  Yes   Zostavax completed No   Shingrix Completed?: No.    Education has been provided regarding the importance of this vaccine. Patient has been advised to call insurance company to determine out of pocket expense if they have not yet received this vaccine. Advised may also receive vaccine at local pharmacy or Health Dept. Verbalized acceptance and understanding.  Screening Tests Health Maintenance  Topic Date Due   Zoster Vaccines- Shingrix (1 of 2) 10/09/1991   FOOT EXAM  04/03/2019   INFLUENZA VACCINE  08/31/2022   COVID-19 Vaccine (7 - 2023-24 season) 10/01/2022   OPHTHALMOLOGY EXAM  11/08/2022   Diabetic kidney evaluation - Urine ACR  12/21/2022   HEMOGLOBIN A1C  12/21/2022   Diabetic kidney evaluation - eGFR measurement  06/20/2023   Medicare Annual Wellness (AWV)  11/14/2023   DTaP/Tdap/Td (2 - Td or Tdap) 08/12/2024   Pneumonia Vaccine 18+ Years old  Completed   HPV VACCINES  Aged Out   Hepatitis C Screening  Discontinued    Health Maintenance  Health Maintenance Due  Topic Date Due   Zoster Vaccines- Shingrix (1 of 2) 10/09/1991   FOOT EXAM  04/03/2019   INFLUENZA VACCINE  08/31/2022   COVID-19 Vaccine (7 - 2023-24 season) 10/01/2022   OPHTHALMOLOGY EXAM  11/08/2022    Colorectal cancer screening: No longer required.   Lung Cancer Screening: (Low Dose CT Chest recommended if Age 24-80 years, 20 pack-year currently smoking OR have quit w/in 15years.) does qualify.   Lung Cancer Screening Referral: 07/26/2022  Additional Screening:  Hepatitis C Screening: does qualify; Completed 12/01/2020  Vision Screening: Recommended annual ophthalmology exams for early detection of glaucoma and other disorders of the eye. Is the patient up to date with their annual eye exam?  Yes  Who is the provider or what is the name of the office in which the patient attends annual eye exams? Dr. Dione Booze If pt is not established with a provider, would they like to be referred to a provider to  establish care? No .   Dental Screening: Recommended annual dental exams for proper oral hygiene  Diabetic Foot Exam: Diabetic Foot Exam: Overdue, Pt has been advised about the importance in completing this exam. Pt is scheduled for diabetic foot exam on his next visit.  Community Resource Referral / Chronic Care Management: CRR required this visit?  No   CCM required this visit?  No     Plan:     I have personally reviewed and noted the following in the patient's chart:   Medical and social history Use of alcohol, tobacco or illicit drugs  Current medications and supplements including opioid prescriptions. Patient is not currently taking opioid prescriptions. Functional ability and status Nutritional status Physical activity Advanced directives List of other physicians Hospitalizations, surgeries, and ER visits in previous 12 months Vitals Screenings to include cognitive, depression, and falls Referrals and appointments  In addition, I have reviewed and discussed with patient certain preventive protocols, quality metrics, and best practice recommendations. A written personalized care plan for preventive services as well as general preventive health recommendations were provided to patient.     Melika Reder L Davelle Anselmi, CMA   11/14/2022   After Visit Summary: (MyChart) Due to this being a telephonic visit, the after visit summary with patients personalized plan was offered to patient via MyChart   Nurse Notes: Patient is due for a Flu vaccine, a Shingrix vaccine and a Covid vaccine.  Patient stated he had an eye exam yesterday and a letter has been sent for records today.  He had no other issues to address today.

## 2022-11-22 ENCOUNTER — Other Ambulatory Visit: Payer: Self-pay | Admitting: Internal Medicine

## 2022-12-12 ENCOUNTER — Other Ambulatory Visit: Payer: Self-pay | Admitting: Internal Medicine

## 2022-12-22 ENCOUNTER — Ambulatory Visit: Payer: Medicare PPO | Admitting: Internal Medicine

## 2023-01-02 ENCOUNTER — Encounter: Payer: Self-pay | Admitting: Internal Medicine

## 2023-01-02 NOTE — Patient Instructions (Addendum)
Blood work was ordered.       Medications changes include :   None    A referral was ordered and someone will call you to schedule an appointment.     Return in about 6 months (around 07/04/2023) for follow up.   Health Maintenance, Male Adopting a healthy lifestyle and getting preventive care are important in promoting health and wellness. Ask your health care provider about: The right schedule for you to have regular tests and exams. Things you can do on your own to prevent diseases and keep yourself healthy. What should I know about diet, weight, and exercise? Eat a healthy diet  Eat a diet that includes plenty of vegetables, fruits, low-fat dairy products, and lean protein. Do not eat a lot of foods that are high in solid fats, added sugars, or sodium. Maintain a healthy weight Body mass index (BMI) is a measurement that can be used to identify possible weight problems. It estimates body fat based on height and weight. Your health care provider can help determine your BMI and help you achieve or maintain a healthy weight. Get regular exercise Get regular exercise. This is one of the most important things you can do for your health. Most adults should: Exercise for at least 150 minutes each week. The exercise should increase your heart rate and make you sweat (moderate-intensity exercise). Do strengthening exercises at least twice a week. This is in addition to the moderate-intensity exercise. Spend less time sitting. Even light physical activity can be beneficial. Watch cholesterol and blood lipids Have your blood tested for lipids and cholesterol at 81 years of age, then have this test every 5 years. You may need to have your cholesterol levels checked more often if: Your lipid or cholesterol levels are high. You are older than 81 years of age. You are at high risk for heart disease. What should I know about cancer screening? Many types of cancers can be detected  early and may often be prevented. Depending on your health history and family history, you may need to have cancer screening at various ages. This may include screening for: Colorectal cancer. Prostate cancer. Skin cancer. Lung cancer. What should I know about heart disease, diabetes, and high blood pressure? Blood pressure and heart disease High blood pressure causes heart disease and increases the risk of stroke. This is more likely to develop in people who have high blood pressure readings or are overweight. Talk with your health care provider about your target blood pressure readings. Have your blood pressure checked: Every 3-5 years if you are 77-80 years of age. Every year if you are 64 years old or older. If you are between the ages of 90 and 63 and are a current or former smoker, ask your health care provider if you should have a one-time screening for abdominal aortic aneurysm (AAA). Diabetes Have regular diabetes screenings. This checks your fasting blood sugar level. Have the screening done: Once every three years after age 82 if you are at a normal weight and have a low risk for diabetes. More often and at a younger age if you are overweight or have a high risk for diabetes. What should I know about preventing infection? Hepatitis B If you have a higher risk for hepatitis B, you should be screened for this virus. Talk with your health care provider to find out if you are at risk for hepatitis B infection. Hepatitis C Blood testing is recommended for:  Everyone born from [birth year] through 1964. Anyone with known risk factors for hepatitis C. Sexually transmitted infections (STIs) You should be screened each year for STIs, including gonorrhea and chlamydia, if: You are sexually active and are younger than 81 years of age. You are older than 81 years of age and your health care provider tells you that you are at risk for this type of infection. Your sexual activity has changed since  you were last screened, and you are at increased risk for chlamydia or gonorrhea. Ask your health care provider if you are at risk. Ask your health care provider about whether you are at high risk for HIV. Your health care provider may recommend a prescription medicine to help prevent HIV infection. If you choose to take medicine to prevent HIV, you should first get tested for HIV. You should then be tested every 3 months for as long as you are taking the medicine. Follow these instructions at home: Alcohol use Do not drink alcohol if your health care provider tells you not to drink. If you drink alcohol: Limit how much you have to 0-2 drinks a day. Know how much alcohol is in your drink. In the [place] one drink equals one 12 oz bottle of beer (355 mL), one 5 oz glass of wine (148 mL), or one 1 oz glass of hard liquor (44 mL). Lifestyle Do not use any products that contain nicotine or tobacco. These products include cigarettes, chewing tobacco, and vaping devices, such as e-cigarettes. If you need help quitting, ask your health care provider. Do not use street drugs. Do not share needles. Ask your health care provider for help if you need support or information about quitting drugs. General instructions Schedule regular health, dental, and eye exams. Stay current with your vaccines. Tell your health care provider if: You often feel depressed. You have ever been abused or do not feel safe at home. Summary Adopting a healthy lifestyle and getting preventive care are important in promoting health and wellness. Follow your health care provider's instructions about healthy diet, exercising, and getting tested or screened for diseases. Follow your health care provider's instructions on monitoring your cholesterol and blood pressure. This information is not intended to replace advice given to you by your health care provider. Make sure you discuss any questions you have with your health care  provider. Document Revised: 06/07/2020 Document Reviewed: 06/07/2020 Elsevier Patient Education  2024 [employer].

## 2023-01-02 NOTE — Progress Notes (Unsigned)
Subjective:    Patient ID: Bryce Maldonado, male    DOB: 1940-04-29, 82 y.o.   MRN: [id number]     HPI Mahari is here for a physical exam and his chronic medical problems.   Feet are bothering him - the neuropathy.   Has some stumbling and falling issues.  Has pain but it is not severe.   No exercise.   Not compliant with a diabetic diet.    Medications and allergies reviewed with patient and updated if appropriate.  Current Outpatient Medications on File Prior to Visit  Medication Sig Dispense Refill   allopurinol (ZYLOPRIM) 300 MG tablet TAKE 1 TABLET(300 MG) BY MOUTH DAILY 90 tablet 1   ELIQUIS 5 MG TABS tablet TAKE 1 TABLET(5 MG) BY MOUTH TWICE DAILY 180 tablet 3   ezetimibe (ZETIA) 10 MG tablet TAKE 1 TABLET(10 MG) BY MOUTH AT BEDTIME 90 tablet 1   glimepiride (AMARYL) 2 MG tablet TAKE 1 TABLET(2 MG) BY MOUTH DAILY BEFORE BREAKFAST 90 tablet 1   JARDIANCE 25 MG TABS tablet TAKE 1 TABLET(25 MG) BY MOUTH DAILY 90 tablet 2   losartan (COZAAR) 25 MG tablet TAKE 1 TABLET(25 MG) BY MOUTH DAILY 90 tablet 1   Melatonin 5 MG TABS Take 5 mg by mouth at bedtime.     metFORMIN (GLUCOPHAGE) 500 MG tablet TAKE 2 TABLETS(1000 MG) BY MOUTH TWICE DAILY WITH A MEAL 360 tablet 2   Multiple Vitamins-Minerals (CENTRUM PO) Take 1 tablet by mouth daily.     OVER THE COUNTER MEDICATION Take 3 capsules by mouth daily. Balance of Nature Fruits and Veggies     rosuvastatin (CRESTOR) 10 MG tablet TAKE 1 TABLET(10 MG) BY MOUTH AT BEDTIME 90 tablet 3   sodium chloride (OCEAN) 0.65 % SOLN nasal spray Place 1 spray into both nostrils at bedtime.     venlafaxine XR (EFFEXOR-XR) 37.5 MG 24 hr capsule TAKE 1 CAPSULE(37.5 MG) BY MOUTH DAILY WITH BREAKFAST 90 capsule 3   No current facility-administered medications on file prior to visit.    Review of Systems  Constitutional:  Negative for fever.  Eyes:  Negative for visual disturbance.  Respiratory:  Positive for cough (occ). Negative for shortness of  breath and wheezing.   Cardiovascular:  Negative for chest pain, palpitations and leg swelling.  Gastrointestinal:  Negative for abdominal pain, blood in stool, constipation and diarrhea.       No gerd  Genitourinary:  Negative for difficulty urinating and dysuria.  Musculoskeletal:  Positive for arthralgias. Negative for back pain.  Skin:  Negative for rash.  Neurological:  Negative for light-headedness, numbness (tingling and nerve pain in feet) and headaches.  Psychiatric/Behavioral:  Negative for dysphoric mood. The patient is not nervous/anxious.        Objective:   Vitals:   01/03/23 1016  BP: 118/80  Pulse: 72  Temp: 98.3 F (36.8 C)  SpO2: 95%   Filed Weights   01/03/23 1016  Weight: 190 lb (86.2 kg)   Body mass index is 26.5 kg/m.  BP Readings from Last 3 Encounters:  01/03/23 118/80  07/26/22 118/70  06/20/22 116/68    Wt Readings from Last 3 Encounters:  01/03/23 190 lb (86.2 kg)  11/14/22 181 lb (82.1 kg)  07/26/22 179 lb 12.8 oz (81.6 kg)      Physical Exam Constitutional: He appears well-developed and well-nourished. No distress.  HENT:  Head: Normocephalic and atraumatic.  Right Ear: External ear normal.  Left Ear: External ear  normal.  Normal ear canals and TM b/l  Mouth/Throat: Oropharynx is clear and moist. Eyes: Conjunctivae and EOM are normal.  Neck: Neck supple. No tracheal deviation present. No thyromegaly present.  No carotid bruit  Cardiovascular: Normal rate, regular rhythm, normal heart sounds and intact distal pulses.   No murmur heard.  No lower extremity edema. Pulmonary/Chest: Effort normal and breath sounds normal. No respiratory distress. He has no wheezes. He has no rales.  Abdominal: Soft. He exhibits no distension. There is no tenderness.  Genitourinary: deferred  Lymphadenopathy:   He has no cervical adenopathy.  Skin: Skin is warm and dry. He is not diaphoretic.  Psychiatric: He has a normal mood and affect. His behavior  is normal.   Diabetic Foot Exam - Simple   Simple Foot Form Diabetic Foot exam was performed with the following findings: Yes 01/03/2023 12:18 PM  Visual Inspection No deformities, no ulcerations, no other skin breakdown bilaterally: Yes Sensation Testing Intact to touch and monofilament testing bilaterally: Yes Pulse Check Posterior Tibialis and Dorsalis pulse intact bilaterally: Yes Comments          Assessment & Plan:   Physical exam: Screening blood work  ordered Exercise   none - stressed regular exercise Weight  ok for age Substance abuse   none   Reviewed recommended immunizations.   Health Maintenance  Topic Date Due   OPHTHALMOLOGY EXAM  11/08/2022   Diabetic kidney evaluation - Urine ACR  12/21/2022   HEMOGLOBIN A1C  12/21/2022   COVID-19 Vaccine (7 - 2023-24 season) 01/19/2023 (Originally 10/01/2022)   Zoster Vaccines- Shingrix (1 of 2) 04/03/2023 (Originally 10/09/1991)   Diabetic kidney evaluation - eGFR measurement  06/20/2023   Medicare Annual Wellness (AWV)  11/14/2023   FOOT EXAM  01/03/2024   DTaP/Tdap/Td (2 - Td or Tdap) 08/12/2024   Pneumonia Vaccine 88+ Years old  Completed   INFLUENZA VACCINE  Completed   HPV VACCINES  Aged Out   Hepatitis C Screening  Discontinued     See Problem List for Assessment and Plan of chronic medical problems.

## 2023-01-03 ENCOUNTER — Ambulatory Visit (INDEPENDENT_AMBULATORY_CARE_PROVIDER_SITE_OTHER): Payer: Medicare PPO | Admitting: Internal Medicine

## 2023-01-03 VITALS — BP 118/80 | HR 72 | Temp 98.3°F | Ht 71.0 in | Wt 190.0 lb

## 2023-01-03 DIAGNOSIS — M1A9XX0 Chronic gout, unspecified, without tophus (tophi): Secondary | ICD-10-CM

## 2023-01-03 DIAGNOSIS — I480 Paroxysmal atrial fibrillation: Secondary | ICD-10-CM | POA: Diagnosis not present

## 2023-01-03 DIAGNOSIS — F419 Anxiety disorder, unspecified: Secondary | ICD-10-CM | POA: Diagnosis not present

## 2023-01-03 DIAGNOSIS — I10 Essential (primary) hypertension: Secondary | ICD-10-CM

## 2023-01-03 DIAGNOSIS — E1142 Type 2 diabetes mellitus with diabetic polyneuropathy: Secondary | ICD-10-CM

## 2023-01-03 DIAGNOSIS — Z7984 Long term (current) use of oral hypoglycemic drugs: Secondary | ICD-10-CM

## 2023-01-03 DIAGNOSIS — Z0000 Encounter for general adult medical examination without abnormal findings: Secondary | ICD-10-CM | POA: Diagnosis not present

## 2023-01-03 DIAGNOSIS — E1159 Type 2 diabetes mellitus with other circulatory complications: Secondary | ICD-10-CM

## 2023-01-03 DIAGNOSIS — I25810 Atherosclerosis of coronary artery bypass graft(s) without angina pectoris: Secondary | ICD-10-CM | POA: Diagnosis not present

## 2023-01-03 DIAGNOSIS — E782 Mixed hyperlipidemia: Secondary | ICD-10-CM

## 2023-01-03 LAB — CBC WITH DIFFERENTIAL/PLATELET
Basophils Absolute: 0.1 10*3/uL (ref 0.0–0.1)
Basophils Relative: 1.4 % (ref 0.0–3.0)
Eosinophils Absolute: 0.2 10*3/uL (ref 0.0–0.7)
Eosinophils Relative: 3.4 % (ref 0.0–5.0)
HCT: 47 % (ref 39.0–52.0)
Hemoglobin: 15.6 g/dL (ref 13.0–17.0)
Lymphocytes Relative: 16.8 % (ref 12.0–46.0)
Lymphs Abs: 1.1 10*3/uL (ref 0.7–4.0)
MCHC: 33.2 g/dL (ref 30.0–36.0)
MCV: 94.5 fL (ref 78.0–100.0)
Monocytes Absolute: 0.8 10*3/uL (ref 0.1–1.0)
Monocytes Relative: 12.4 % — ABNORMAL HIGH (ref 3.0–12.0)
Neutro Abs: 4.2 10*3/uL (ref 1.4–7.7)
Neutrophils Relative %: 66 % (ref 43.0–77.0)
Platelets: 282 10*3/uL (ref 150.0–400.0)
RBC: 4.98 Mil/uL (ref 4.22–5.81)
RDW: 15.1 % (ref 11.5–15.5)
WBC: 6.3 10*3/uL (ref 4.0–10.5)

## 2023-01-03 LAB — COMPREHENSIVE METABOLIC PANEL
ALT: 15 U/L (ref 0–53)
AST: 15 U/L (ref 0–37)
Albumin: 4.9 g/dL (ref 3.5–5.2)
Alkaline Phosphatase: 64 U/L (ref 39–117)
BUN: 21 mg/dL (ref 6–23)
CO2: 30 meq/L (ref 19–32)
Calcium: 10.5 mg/dL (ref 8.4–10.5)
Chloride: 102 meq/L (ref 96–112)
Creatinine, Ser: 0.77 mg/dL (ref 0.40–1.50)
GFR: 84.12 mL/min (ref >60.00)
Glucose, Bld: 111 mg/dL — ABNORMAL HIGH (ref 70–99)
Potassium: 4.4 meq/L (ref 3.5–5.1)
Sodium: 140 meq/L (ref 135–145)
Total Bilirubin: 0.8 mg/dL (ref 0.2–1.2)
Total Protein: 8.3 g/dL (ref 6.0–8.3)

## 2023-01-03 LAB — LIPID PANEL
Cholesterol: 144 mg/dL (ref 0–200)
HDL: 53.3 mg/dL (ref >39.00)
LDL Cholesterol: 69 mg/dL (ref 0–99)
NonHDL: 90.43
Total CHOL/HDL Ratio: 3
Triglycerides: 109 mg/dL (ref 0.0–149.0)
VLDL: 21.8 mg/dL (ref 0.0–40.0)

## 2023-01-03 LAB — HEMOGLOBIN A1C: Hgb A1c MFr Bld: 6.2 % (ref 4.6–6.5)

## 2023-01-03 LAB — MICROALBUMIN / CREATININE URINE RATIO
Creatinine,U: 54.9 mg/dL
Microalb Creat Ratio: 2 mg/g (ref 0.0–30.0)
Microalb, Ur: 1.1 mg/dL (ref 0.0–1.9)

## 2023-01-03 LAB — TSH: TSH: 1.24 u[IU]/mL (ref 0.35–5.50)

## 2023-01-03 NOTE — Assessment & Plan Note (Signed)
Chronic Blood pressure is well-controlled CBC, CMP Continue losartan 25 mg daily

## 2023-01-03 NOTE — Assessment & Plan Note (Signed)
Chronic Follows with cardiology No symptoms consistent with angina On Eliquis, Zetia, Crestor, Jardiance and losartan

## 2023-01-03 NOTE — Assessment & Plan Note (Signed)
Chronic Regular exercise and healthy diet encouraged Check lipid panel  Continue Crestor 10 mg daily, Zetia 10 mg daily

## 2023-01-03 NOTE — Assessment & Plan Note (Signed)
Chronic Controlled No recent gout attacks Continue allopurinol 300 mg daily

## 2023-01-03 NOTE — Assessment & Plan Note (Signed)
Chronic Lab Results  Component Value Date   HGBA1C 7.5 (H) 06/20/2022   Sugars not ideally controlled Check A1c today, urine albumin/creatinine ratio Continue glimepiride 2 mg daily, metformin 1000 mg twice daily, Jardiance 25 mg daily Stressed diabetic diet, exercise

## 2023-01-03 NOTE — Assessment & Plan Note (Signed)
Chronic Controlled, Stable Continue Effexor XR 37.5 mg daily 

## 2023-01-03 NOTE — Assessment & Plan Note (Signed)
Chronic Following with cardiology On Eliquis 5 mg twice daily Rate controlled without medication CBC, CMP

## 2023-01-03 NOTE — Assessment & Plan Note (Addendum)
Chronic Has pain and tingling His balance is off Stressed sugar control Discussed gabapentin at night - pain is more of a discomfort and not bad enough to start another medication Discussed PT - he deferred

## 2023-01-25 ENCOUNTER — Other Ambulatory Visit: Payer: Self-pay | Admitting: Cardiology

## 2023-01-26 NOTE — Telephone Encounter (Signed)
Prescription refill request for Eliquis received. Indication:afib Last office visit:2/24 Scr:0.77  12/24 Age: 82 Weight:86.2  kg  Prescription refilled

## 2023-02-16 ENCOUNTER — Other Ambulatory Visit: Payer: Self-pay | Admitting: Internal Medicine

## 2023-02-20 ENCOUNTER — Other Ambulatory Visit: Payer: Self-pay | Admitting: Cardiology

## 2023-03-05 NOTE — Progress Notes (Signed)
 [address] [place], [place]  [address] Phone: [phone] Fax:  [phone]  Date:  03/09/2023   ID:  Bryce Maldonado, DOB 08-Jul-1940, MRN [id number]  PCP:  Geofm Glade PARAS, MD  Cardiologist:  Dr. Jamiya Nims     History of Present Illness: Bryce Maldonado is a 83 y.o. male is seen for follow up CAD and atrial flutter.   He has a hx of CAD, s/p CABG in 1995, HL, prostate CA, DJD, spinal stenosis. In 03/2010 he underwent a Lexiscan Myoview. This demonstrated an EF of 66% and inferior ischemia. LHC 01/2010: all grafts patent.  EF 60-65%. He was treated medically.   In 2016 he had new onset atrial flutter. He was anticoagulated and underwent Ablation by Dr. Kelsie on October 01, 2014. He had repeat ablation on Nov. 4 2016.  On his visit 2020 he was noted to be in atrial flutter with controlled rate. He was asymptomatic. Started on Eliquis . Treated with rate control only.   He did undergo lumbar L2-5 surgery in July.   On follow up today he is doing well. He denies any chest pain, dyspnea, palpitations or dizziness. Main activity is reading and walking.    Wt Readings from Last 3 Encounters:  03/09/23 191 lb (86.6 kg)  01/03/23 190 lb (86.2 kg)  11/14/22 181 lb (82.1 kg)     Past Medical History:  Diagnosis Date   Anemia    Atrial fibrillation (HCC)    pt has had an ablation   BPH (benign prostatic hyperplasia)    Elevated PSA   Childhood asthma    Coronary artery disease    post CABG x4 -- in 1995 -- due to three-vessel coronary artery disease   Diverticular disease    Dyslipidemia    First degree AV block    Gout    Hypertension    Osteoarthritis    everywhere (12/04/2014)   Prostate cancer (HCC) 01/30/2010   Dr Ceil   Spinal stenosis 01/30/2002   L4 radiculopathy, herniated nucleus pulposus L4-5   Tinnitus    Type II diabetes mellitus (HCC)    Typical atrial flutter (HCC)    s/p ablation    Current Outpatient Medications   Medication Sig Dispense Refill   allopurinol  (ZYLOPRIM ) 300 MG tablet TAKE 1 TABLET(300 MG) BY MOUTH DAILY 90 tablet 1   ELIQUIS  5 MG TABS tablet TAKE 1 TABLET(5 MG) BY MOUTH TWICE DAILY 180 tablet 3   ezetimibe  (ZETIA ) 10 MG tablet TAKE 1 TABLET(10 MG) BY MOUTH AT BEDTIME 90 tablet 1   glimepiride  (AMARYL ) 2 MG tablet TAKE 1 TABLET(2 MG) BY MOUTH DAILY BEFORE BREAKFAST 90 tablet 1   JARDIANCE  25 MG TABS tablet TAKE 1 TABLET(25 MG) BY MOUTH DAILY 90 tablet 2   losartan  (COZAAR ) 25 MG tablet TAKE 1 TABLET(25 MG) BY MOUTH DAILY 90 tablet 1   Melatonin 5 MG TABS Take 5 mg by mouth at bedtime.     metFORMIN  (GLUCOPHAGE ) 500 MG tablet TAKE 2 TABLETS(1000 MG) BY MOUTH TWICE DAILY WITH A MEAL 360 tablet 2   Multiple Vitamins-Minerals (CENTRUM PO) Take 1 tablet by mouth daily.     OVER THE COUNTER MEDICATION Take 3 capsules by mouth daily. Balance of Nature Fruits and Veggies     rosuvastatin  (CRESTOR ) 10 MG tablet TAKE 1 TABLET(10 MG) BY MOUTH AT BEDTIME 90 tablet 3   sodium chloride  (OCEAN) 0.65 % SOLN nasal spray Place 1 spray into both nostrils  at bedtime.     venlafaxine  XR (EFFEXOR -XR) 37.5 MG 24 hr capsule TAKE 1 CAPSULE(37.5 MG) BY MOUTH DAILY WITH BREAKFAST 90 capsule 3   No current facility-administered medications for this visit.    Allergies:    Allergies  Allergen Reactions   Naproxen Sodium Rash    Social History:  The patient  reports that he quit smoking about 53 years ago. His smoking use included cigarettes. He started smoking about 68 years ago. He has a 37.5 pack-year smoking history. He has never used smokeless tobacco. He reports that he does not currently use alcohol  after a past usage of about 2.0 standard drinks of alcohol  per week. He reports that he does not use drugs.   ROS:  Please see the history of present illness.      All other systems reviewed and negative.   PHYSICAL EXAM: VS:  BP 110/62   Pulse 88   Ht 6' (1.829 m)   Wt 191 lb (86.6 kg)   SpO2 98%   BMI  25.90 kg/m  GENERAL:  Well appearing WM in NAD HEENT:  PERRL, EOMI, sclera are clear. Oropharynx is clear. NECK:  No jugular venous distention, carotid upstroke brisk and symmetric, no bruits, no thyromegaly or adenopathy LUNGS:  Clear to auscultation bilaterally CHEST:  Unremarkable HEART:  IRRR,  PMI not displaced or sustained,S1 and S2 within normal limits, no S3, no S4: no clicks, no rubs, harsh gr 2/6 systolic murmur RUSB to apex. ABD:  Soft, nontender. BS +, no masses or bruits. No hepatomegaly, no splenomegaly EXT:  2 + pulses throughout, no edema, no cyanosis no clubbing SKIN:  Warm and dry.  No rashes NEURO:  Alert and oriented x 3. Cranial nerves II through XII intact. PSYCH:  Cognitively intact    Laboratory data:  Lab Results  Component Value Date   WBC 6.3 01/03/2023   HGB 15.6 01/03/2023   HCT 47.0 01/03/2023   PLT 282.0 01/03/2023   GLUCOSE 111 (H) 01/03/2023   CHOL 144 01/03/2023   TRIG 109.0 01/03/2023   HDL 53.30 01/03/2023   LDLDIRECT 86.2 06/24/2010   LDLCALC 69 01/03/2023   ALT 15 01/03/2023   AST 15 01/03/2023   NA 140 01/03/2023   K 4.4 01/03/2023   CL 102 01/03/2023   CREATININE 0.77 01/03/2023   BUN 21 01/03/2023   CO2 30 01/03/2023   TSH 1.24 01/03/2023   PSA 6.12 (H) 08/06/2006   INR 0.97 03/04/2010   HGBA1C 6.2 01/03/2023   MICROALBUR 1.1 01/03/2023         Echo 12/31/18:IMPRESSIONS     1. Left ventricular ejection fraction, by visual estimation, is 70 to  75%. The left ventricle has hyperdynamic function. There is no left  ventricular hypertrophy.   2. Left ventricular diastolic function could not be evaluated.   3. Global right ventricle has normal systolic function.The right  ventricular size is normal. No increase in right ventricular wall  thickness.   4. Left atrial size was mildly dilated.   5. Right atrial size was normal.   6. Moderate to severe aortic valve annular calcification.   7. Moderate to severe mitral annular  calcification.   8. The mitral valve is grossly normal. Mild mitral valve regurgitation.   9. The tricuspid valve is normal in structure. Tricuspid valve  regurgitation is mild.  10. The aortic valve has an indeterminant number of cusps. Aortic valve  regurgitation is mild.  11. The pulmonic valve was normal  in structure. Pulmonic valve  regurgitation is trivial.  12. Aortic dilatation noted.  13. There is mild dilatation of the ascending aorta.  14. Normal pulmonary artery systolic pressure.  15. The atrial septum is grossly normal.    ASSESSMENT AND PLAN:  CAD: s/p CABG 1995. Now 30 years out and doing well. He is asymptomatic.  Cardiac cath in  2012 showed patent grafts.  Myoview at the time showed inferior ischemia. Continue statin. No ASA since on Eliquis   Atrial flutter- s/p  Ablation x 2 in 2016. Recurrent Afibflutter since 2020. Now with Afib and controlled rate. He is asymptomatic.  CHAD vasc score of 4. Will continue Eliquis  5 mg bid.   3.  Hyperlipidemia:  At goal on statin. LDL 69   4.   DM per primary care. A1c 6.2%  5.   HTN. Well controlled.  6.  Aortic stenosis on exam. Will update Echo   I will follow up in one year.  Signed, Meliton Samad MD, FACC

## 2023-03-09 ENCOUNTER — Ambulatory Visit: Payer: Medicare PPO | Attending: Cardiology | Admitting: Cardiology

## 2023-03-09 ENCOUNTER — Encounter: Payer: Self-pay | Admitting: Cardiology

## 2023-03-09 VITALS — BP 110/62 | HR 88 | Ht 72.0 in | Wt 191.0 lb

## 2023-03-09 DIAGNOSIS — I10 Essential (primary) hypertension: Secondary | ICD-10-CM

## 2023-03-09 DIAGNOSIS — I350 Nonrheumatic aortic (valve) stenosis: Secondary | ICD-10-CM | POA: Diagnosis not present

## 2023-03-09 DIAGNOSIS — I4819 Other persistent atrial fibrillation: Secondary | ICD-10-CM

## 2023-03-09 DIAGNOSIS — I25708 Atherosclerosis of coronary artery bypass graft(s), unspecified, with other forms of angina pectoris: Secondary | ICD-10-CM

## 2023-03-09 DIAGNOSIS — E782 Mixed hyperlipidemia: Secondary | ICD-10-CM | POA: Diagnosis not present

## 2023-03-09 NOTE — Patient Instructions (Addendum)
 Medication Instructions:  Continue same medications *If you need a refill on your cardiac medications before your next appointment, please call your pharmacy*   Lab Work: None ordered   Testing/Procedures: Echo  first available   Follow-Up: At [facility], you and your health needs are our priority.  As part of our continuing mission to provide you with exceptional heart care, we have created designated Provider Care Teams.  These Care Teams include your primary Cardiologist (physician) and Advanced Practice Providers (APPs -  Physician Assistants and Nurse Practitioners) who all work together to provide you with the care you need, when you need it.  We recommend signing up for the patient portal called MyChart.  Sign up information is provided on this After Visit Summary.  MyChart is used to connect with patients for Virtual Visits (Telemedicine).  Patients are able to view lab/test results, encounter notes, upcoming appointments, etc.  Non-urgent messages can be sent to your provider as well.   To learn more about what you can do with MyChart, go to [email or web address].    Your next appointment:  1 Year      Call in Oct to schedule Feb appointment     Provider:  Dr.Jordan

## 2023-04-12 ENCOUNTER — Ambulatory Visit (HOSPITAL_COMMUNITY)
Admission: RE | Admit: 2023-04-12 | Discharge: 2023-04-12 | Disposition: A | Discharge status: Home / Self Care | Payer: Medicare PPO | Source: Ambulatory Visit | Attending: Cardiology | Admitting: Cardiology

## 2023-04-12 DIAGNOSIS — I10 Essential (primary) hypertension: Secondary | ICD-10-CM | POA: Diagnosis not present

## 2023-04-12 DIAGNOSIS — I350 Nonrheumatic aortic (valve) stenosis: Secondary | ICD-10-CM | POA: Insufficient documentation

## 2023-04-12 DIAGNOSIS — E782 Mixed hyperlipidemia: Secondary | ICD-10-CM | POA: Diagnosis not present

## 2023-04-12 DIAGNOSIS — I4819 Other persistent atrial fibrillation: Secondary | ICD-10-CM | POA: Insufficient documentation

## 2023-04-12 DIAGNOSIS — I25708 Atherosclerosis of coronary artery bypass graft(s), unspecified, with other forms of angina pectoris: Secondary | ICD-10-CM | POA: Insufficient documentation

## 2023-04-12 LAB — ECHOCARDIOGRAM COMPLETE
AR max vel: 0.63 cm2
AV Area VTI: 0.53 cm2
AV Area mean vel: 0.57 cm2
AV Mean grad: 35 mmHg
AV Peak grad: 54.8 mmHg
AV Vena cont: 0.46 cm
Ao pk vel: 3.7 m/s
Area-P 1/2: 1.39 cm2
MV M vel: 5.66 m/s
MV Peak grad: 128.1 mmHg
MV VTI: 0.72 cm2
S' Lateral: 2.05 cm

## 2023-04-17 NOTE — H&P (View-Only) (Signed)
 [address] [place], [place]  [address] Phone: [phone] Fax:  [phone]  Date:  04/18/2023   ID:  Bryce Maldonado, DOB 02-20-1940, MRN [id number]  PCP:  [name], MD  Cardiologist:  [name]     History of Present Illness: Bryce Maldonado is a 83 y.o. male is seen for evaluation of progressive aortic stenosis.   He has a hx of CAD, s/p CABG in 1995, HL, prostate CA, DJD, spinal stenosis. In 03/2010 he underwent a Lexiscan Myoview. This demonstrated an EF of 66% and inferior ischemia. LHC 01/2010: all grafts patent.  EF 60-65%. He was treated medically.   In 2016 he had new onset atrial flutter. He was anticoagulated and underwent Ablation by Dr. Johney Frame on October 01, 2014. He had repeat ablation on Nov. 4 2016.  On his visit 2020 he was noted to be in atrial flutter with controlled rate. He was asymptomatic. Started on Eliquis. Treated with rate control only.   He was seen this past month and doing well. Noted worsening AS murmur and echo done indicating severe aortic stenosis with mean AV gradient of 35 mm Hg with valve area 0.53 cm squared. There was also severe mitral annular calcification. With moderate MS.   He states he really isn't doing anything. Mainly from leg weakness and motivation. Doesn't note significant chest pain, SOB or dizziness.   Wt Readings from Last 3 Encounters:  04/18/23 191 lb (86.6 kg)  03/09/23 191 lb (86.6 kg)  01/03/23 190 lb (86.2 kg)     Past Medical History:  Diagnosis Date   Anemia    Atrial fibrillation (HCC)    pt has had an ablation   BPH (benign prostatic hyperplasia)    Elevated PSA   Childhood asthma    Coronary artery disease    post CABG x4 -- in 1995 -- due to three-vessel coronary artery disease   Diverticular disease    Dyslipidemia    First degree AV block    Gout    Hypertension    Osteoarthritis    "everywhere" (12/04/2014)   Prostate cancer (HCC) 01/30/2010   [name]   Spinal  stenosis 01/30/2002   L4 radiculopathy, herniated nucleus pulposus L4-5   Tinnitus    Type II diabetes mellitus (HCC)    Typical atrial flutter (HCC)    s/p ablation    Current Outpatient Medications  Medication Sig Dispense Refill   allopurinol (ZYLOPRIM) 300 MG tablet TAKE 1 TABLET(300 MG) BY MOUTH DAILY 90 tablet 1   ELIQUIS 5 MG TABS tablet TAKE 1 TABLET(5 MG) BY MOUTH TWICE DAILY 180 tablet 3   ezetimibe (ZETIA) 10 MG tablet TAKE 1 TABLET(10 MG) BY MOUTH AT BEDTIME 90 tablet 1   glimepiride (AMARYL) 2 MG tablet TAKE 1 TABLET(2 MG) BY MOUTH DAILY BEFORE BREAKFAST 90 tablet 1   JARDIANCE 25 MG TABS tablet TAKE 1 TABLET(25 MG) BY MOUTH DAILY 90 tablet 2   losartan (COZAAR) 25 MG tablet TAKE 1 TABLET(25 MG) BY MOUTH DAILY 90 tablet 1   Melatonin 5 MG TABS Take 5 mg by mouth at bedtime.     metFORMIN (GLUCOPHAGE) 500 MG tablet TAKE 2 TABLETS(1000 MG) BY MOUTH TWICE DAILY WITH A MEAL 360 tablet 2   Multiple Vitamins-Minerals (CENTRUM PO) Take 1 tablet by mouth daily.     OVER THE COUNTER MEDICATION Take 3 capsules by mouth daily. Balance of [employer] and Veggies  rosuvastatin (CRESTOR) 10 MG tablet TAKE 1 TABLET(10 MG) BY MOUTH AT BEDTIME 90 tablet 3   sodium chloride (OCEAN) 0.65 % SOLN nasal spray Place 1 spray into both nostrils at bedtime.     venlafaxine XR (EFFEXOR-XR) 37.5 MG 24 hr capsule TAKE 1 CAPSULE(37.5 MG) BY MOUTH DAILY WITH BREAKFAST 90 capsule 3   No current facility-administered medications for this visit.    Allergies:    Allergies  Allergen Reactions   Naproxen Sodium Rash    Social History:  The patient  reports that he quit smoking about 53 years ago. His smoking use included cigarettes. He started smoking about 68 years ago. He has a 37.5 pack-year smoking history. He has never used smokeless tobacco. He reports that he does not currently use alcohol after a past usage of about 2.0 standard drinks of alcohol per week. He reports that he does not use  drugs.   ROS:  Please see the history of present illness.      All other systems reviewed and negative.   PHYSICAL EXAM: VS:  BP (!) 175/80   Pulse 90   Ht 5\' 11"  (1.803 m)   Wt 191 lb (86.6 kg)   BMI 26.64 kg/m  GENERAL:  Well appearing WM in NAD HEENT:  PERRL, EOMI, sclera are clear. Oropharynx is clear. NECK:  No jugular venous distention, carotid upstroke brisk and symmetric, no bruits, no thyromegaly or adenopathy LUNGS:  Clear to auscultation bilaterally CHEST:  Unremarkable HEART:  IRRR,  PMI not displaced or sustained,S1 and S2 within normal limits, no S3, no S4: no clicks, no rubs, harsh gr 3/6 systolic murmur RUSB to apex. ABD:  Soft, nontender. BS +, no masses or bruits. No hepatomegaly, no splenomegaly EXT:  2 + pulses throughout, no edema, no cyanosis no clubbing SKIN:  Warm and dry.  No rashes NEURO:  Alert and oriented x 3. Cranial nerves II through XII intact. PSYCH:  Cognitively intact    Laboratory data:  Lab Results  Component Value Date   WBC 6.3 01/03/2023   HGB 15.6 01/03/2023   HCT 47.0 01/03/2023   PLT 282.0 01/03/2023   GLUCOSE 111 (H) 01/03/2023   CHOL 144 01/03/2023   TRIG 109.0 01/03/2023   HDL 53.30 01/03/2023   LDLDIRECT 86.2 06/24/2010   LDLCALC 69 01/03/2023   ALT 15 01/03/2023   AST 15 01/03/2023   NA 140 01/03/2023   K 4.4 01/03/2023   CL 102 01/03/2023   CREATININE 0.77 01/03/2023   BUN 21 01/03/2023   CO2 30 01/03/2023   TSH 1.24 01/03/2023   PSA 6.12 (H) 08/06/2006   INR 0.97 03/04/2010   HGBA1C 6.2 01/03/2023   MICROALBUR 1.1 01/03/2023         Echo 12/31/18:IMPRESSIONS     1. Left ventricular ejection fraction, by visual estimation, is 70 to  75%. The left ventricle has hyperdynamic function. There is no left  ventricular hypertrophy.   2. Left ventricular diastolic function could not be evaluated.   3. Global right ventricle has normal systolic function.The right  ventricular size is normal. No increase in right  ventricular wall  thickness.   4. Left atrial size was mildly dilated.   5. Right atrial size was normal.   6. Moderate to severe aortic valve annular calcification.   7. Moderate to severe mitral annular calcification.   8. The mitral valve is grossly normal. Mild mitral valve regurgitation.   9. The tricuspid valve is normal in structure.  Tricuspid valve  regurgitation is mild.  10. The aortic valve has an indeterminant number of cusps. Aortic valve  regurgitation is mild.  11. The pulmonic valve was normal in structure. Pulmonic valve  regurgitation is trivial.  12. Aortic dilatation noted.  13. There is mild dilatation of the ascending aorta.  14. Normal pulmonary artery systolic pressure.  15. The atrial septum is grossly normal.    Echo 04/12/23: IMPRESSIONS     1. Left ventricular ejection fraction, by estimation, is 65 to 70%. The  left ventricle has normal function. The left ventricle has no regional  wall motion abnormalities. There is moderate left ventricular hypertrophy.  Left ventricular diastolic function   could not be evaluated.   2. Right ventricular systolic function is normal. The right ventricular  size is normal. There is mildly elevated pulmonary artery systolic  pressure. The estimated right ventricular systolic pressure is 38.5 mmHg.   3. Left atrial size was mildly dilated.   4. The mitral valve is degenerative and heavily calcified. Mild mitral  valve regurgitation. Moderate mitral stenosis. The mean mitral valve  gradient is 11.0 mmHg with average heart rate of 73 bpm.   5. The aortic valve is calcified. The left and right coronary cusps  appear fixed. There is moderate calcification of the aortic valve. Aortic  valve regurgitation is moderate to severe. Severe aortic valve stenosis.  Aortic valve area, by VTI measures  0.53 cm. Aortic valve mean gradient measures 35.0 mmHg. Aortic valve Vmax  measures 3.70 m/s. Peak gradient 54.8 mmHg, DI 0.23.   6.  Aortic dilatation noted. There is borderline dilatation of the aortic  root, measuring 38 mm.   7. The inferior vena cava is dilated in size with >50% respiratory  variability, suggesting right atrial pressure of 8 mmHg.   Comparison(s): 12/31/2018: LVEF 70%, mild AI, moderate-severe MAC, asc aor  38mm, AOV mean 11.4, peak 23.6 mmHg.   Conclusion(s)/Recommendation(s): Recommend further evaluation of AS/AR and  MS with TEE and/or exercise testing to determine if the patient is  symptomatic.    ASSESSMENT AND PLAN:  CAD: s/p CABG 1995. Now 30 years out, no significant angina.  Cardiac cath in  2012 showed patent grafts.  Myoview at the time showed inferior ischemia. Continue statin. No ASA since on Eliquis  Atrial flutter- s/p  Ablation x 2 in 2016. Recurrent Afibflutter since 2020. Now with Afib and controlled rate. He is asymptomatic.  [place] vasc score of 4. Will continue Eliquis 5 mg bid.   3.  Hyperlipidemia:  At goal on statin. LDL 69   4.   DM per primary care. A1c 6.2%  5.   HTN. Well controlled.  6.  Aortic stenosis severe with some AI. Also has moderate mitral stenosis in part related to significant mitral annular calcification. Not really all that symptomatic but has been inactive. Significant progression from Echo in 2020. Discussed pathophysiology of valvular disease. Given severity I think we should pursue evaluation for possible TAVR. Would treate mitral stenosis medically. Not a good candidate for open heart surgery given age and prior open heart surgery. Will schedule for [facility] next week. The procedure and risks were reviewed including but not limited to death, myocardial infarction, stroke, arrythmias, bleeding, transfusion, emergency surgery, dye allergy, or renal dysfunction. The patient voices understanding and is agreeable to proceed.     Signed, Princess Karnes [place] MD, [facility]

## 2023-04-17 NOTE — Progress Notes (Unsigned)
 [address] [place], [place]  [address] Phone: [phone] Fax:  [phone]  Date:  04/18/2023   ID:  Bryce RIESGO, DOB 02-20-1940, MRN [id number]  PCP:  [name], MD  Cardiologist:  [name]     History of Present Illness: Bryce Maldonado is a 83 y.o. male is seen for evaluation of progressive aortic stenosis.   He has a hx of CAD, s/p CABG in 1995, HL, prostate CA, DJD, spinal stenosis. In 03/2010 he underwent a Lexiscan Myoview. This demonstrated an EF of 66% and inferior ischemia. LHC 01/2010: all grafts patent.  EF 60-65%. He was treated medically.   In 2016 he had new onset atrial flutter. He was anticoagulated and underwent Ablation by Dr. Johney Frame on October 01, 2014. He had repeat ablation on Nov. 4 2016.  On his visit 2020 he was noted to be in atrial flutter with controlled rate. He was asymptomatic. Started on Eliquis. Treated with rate control only.   He was seen this past month and doing well. Noted worsening AS murmur and echo done indicating severe aortic stenosis with mean AV gradient of 35 mm Hg with valve area 0.53 cm squared. There was also severe mitral annular calcification. With moderate MS.   He states he really isn't doing anything. Mainly from leg weakness and motivation. Doesn't note significant chest pain, SOB or dizziness.   Wt Readings from Last 3 Encounters:  04/18/23 191 lb (86.6 kg)  03/09/23 191 lb (86.6 kg)  01/03/23 190 lb (86.2 kg)     Past Medical History:  Diagnosis Date   Anemia    Atrial fibrillation (HCC)    pt has had an ablation   BPH (benign prostatic hyperplasia)    Elevated PSA   Childhood asthma    Coronary artery disease    post CABG x4 -- in 1995 -- due to three-vessel coronary artery disease   Diverticular disease    Dyslipidemia    First degree AV block    Gout    Hypertension    Osteoarthritis    "everywhere" (12/04/2014)   Prostate cancer (HCC) 01/30/2010   [name]   Spinal  stenosis 01/30/2002   L4 radiculopathy, herniated nucleus pulposus L4-5   Tinnitus    Type II diabetes mellitus (HCC)    Typical atrial flutter (HCC)    s/p ablation    Current Outpatient Medications  Medication Sig Dispense Refill   allopurinol (ZYLOPRIM) 300 MG tablet TAKE 1 TABLET(300 MG) BY MOUTH DAILY 90 tablet 1   ELIQUIS 5 MG TABS tablet TAKE 1 TABLET(5 MG) BY MOUTH TWICE DAILY 180 tablet 3   ezetimibe (ZETIA) 10 MG tablet TAKE 1 TABLET(10 MG) BY MOUTH AT BEDTIME 90 tablet 1   glimepiride (AMARYL) 2 MG tablet TAKE 1 TABLET(2 MG) BY MOUTH DAILY BEFORE BREAKFAST 90 tablet 1   JARDIANCE 25 MG TABS tablet TAKE 1 TABLET(25 MG) BY MOUTH DAILY 90 tablet 2   losartan (COZAAR) 25 MG tablet TAKE 1 TABLET(25 MG) BY MOUTH DAILY 90 tablet 1   Melatonin 5 MG TABS Take 5 mg by mouth at bedtime.     metFORMIN (GLUCOPHAGE) 500 MG tablet TAKE 2 TABLETS(1000 MG) BY MOUTH TWICE DAILY WITH A MEAL 360 tablet 2   Multiple Vitamins-Minerals (CENTRUM PO) Take 1 tablet by mouth daily.     OVER THE COUNTER MEDICATION Take 3 capsules by mouth daily. Balance of [employer] Vegg[employer]  rosuvastatin (CRESTOR) 10 MG tablet TAKE 1 TABLET(10 MG) BY MOUTH AT BEDTIME 90 tablet 3   sodium chloride (OCEAN) 0.65 % SOLN nasal spray Place 1 spray into both nostrils at bedtime.     venlafaxine XR (EFFEXOR-XR) 37.5 MG 24 hr capsule TAKE 1 CAPSULE(37.5 MG) BY MOUTH DAILY WITH BREAKFAST 90 capsule 3   No current facility-administered medications for this visit.    Allergies:    Allergies  Allergen Reactions   Naproxen Sodium Rash    Social History:  The patient  reports that he quit smoking about 53 years ago. His smoking use included cigarettes. He started smoking about 68 years ago. He has a 37.5 pack-year smoking history. He has never used smokeless tobacco. He reports that he does not currently use alcohol after a past usage of about 2.0 standard drinks of alcohol per week. He reports that he does not use  drugs.   ROS:  Please see the history of present illness.      All other systems reviewed and negative.   PHYSICAL EXAM: VS:  BP (!) 175/80   Pulse 90   Ht 5\' 11"  (1.803 m)   Wt 191 lb (86.6 kg)   BMI 26.64 kg/m  GENERAL:  Well appearing WM in NAD HEENT:  PERRL, EOMI, sclera are clear. Oropharynx is clear. NECK:  No jugular venous distention, carotid upstroke brisk and symmetric, no bruits, no thyromegaly or adenopathy LUNGS:  Clear to auscultation bilaterally CHEST:  Unremarkable HEART:  IRRR,  PMI not displaced or sustained,S1 and S2 within normal limits, no S3, no S4: no clicks, no rubs, harsh gr 3/6 systolic murmur RUSB to apex. ABD:  Soft, nontender. BS +, no masses or bruits. No hepatomegaly, no splenomegaly EXT:  2 + pulses throughout, no edema, no cyanosis no clubbing SKIN:  Warm and dry.  No rashes NEURO:  Alert and oriented x 3. Cranial nerves II through XII intact. PSYCH:  Cognitively intact    Laboratory data:  Lab Results  Component Value Date   WBC 6.3 01/03/2023   HGB 15.6 01/03/2023   HCT 47.0 01/03/2023   PLT 282.0 01/03/2023   GLUCOSE 111 (H) 01/03/2023   CHOL 144 01/03/2023   TRIG 109.0 01/03/2023   HDL 53.30 01/03/2023   LDLDIRECT 86.2 06/24/2010   LDLCALC 69 01/03/2023   ALT 15 01/03/2023   AST 15 01/03/2023   NA 140 01/03/2023   K 4.4 01/03/2023   CL 102 01/03/2023   CREATININE 0.77 01/03/2023   BUN 21 01/03/2023   CO2 30 01/03/2023   TSH 1.24 01/03/2023   PSA 6.12 (H) 08/06/2006   INR 0.97 03/04/2010   HGBA1C 6.2 01/03/2023   MICROALBUR 1.1 01/03/2023         Echo 12/31/18:IMPRESSIONS     1. Left ventricular ejection fraction, by visual estimation, is 70 to  75%. The left ventricle has hyperdynamic function. There is no left  ventricular hypertrophy.   2. Left ventricular diastolic function could not be evaluated.   3. Global right ventricle has normal systolic function.The right  ventricular size is normal. No increase in right  ventricular wall  thickness.   4. Left atrial size was mildly dilated.   5. Right atrial size was normal.   6. Moderate to severe aortic valve annular calcification.   7. Moderate to severe mitral annular calcification.   8. The mitral valve is grossly normal. Mild mitral valve regurgitation.   9. The tricuspid valve is normal in structure.  Tricuspid valve  regurgitation is mild.  10. The aortic valve has an indeterminant number of cusps. Aortic valve  regurgitation is mild.  11. The pulmonic valve was normal in structure. Pulmonic valve  regurgitation is trivial.  12. Aortic dilatation noted.  13. There is mild dilatation of the ascending aorta.  14. Normal pulmonary artery systolic pressure.  15. The atrial septum is grossly normal.    Echo 04/12/23: IMPRESSIONS     1. Left ventricular ejection fraction, by estimation, is 65 to 70%. The  left ventricle has normal function. The left ventricle has no regional  wall motion abnormalities. There is moderate left ventricular hypertrophy.  Left ventricular diastolic function   could not be evaluated.   2. Right ventricular systolic function is normal. The right ventricular  size is normal. There is mildly elevated pulmonary artery systolic  pressure. The estimated right ventricular systolic pressure is 38.5 mmHg.   3. Left atrial size was mildly dilated.   4. The mitral valve is degenerative and heavily calcified. Mild mitral  valve regurgitation. Moderate mitral stenosis. The mean mitral valve  gradient is 11.0 mmHg with average heart rate of 73 bpm.   5. The aortic valve is calcified. The left and right coronary cusps  appear fixed. There is moderate calcification of the aortic valve. Aortic  valve regurgitation is moderate to severe. Severe aortic valve stenosis.  Aortic valve area, by VTI measures  0.53 cm. Aortic valve mean gradient measures 35.0 mmHg. Aortic valve Vmax  measures 3.70 m/s. Peak gradient 54.8 mmHg, DI 0.23.   6.  Aortic dilatation noted. There is borderline dilatation of the aortic  root, measuring 38 mm.   7. The inferior vena cava is dilated in size with >50% respiratory  variability, suggesting right atrial pressure of 8 mmHg.   Comparison(s): 12/31/2018: LVEF 70%, mild AI, moderate-severe MAC, asc aor  38mm, AOV mean 11.4, peak 23.6 mmHg.   Conclusion(s)/Recommendation(s): Recommend further evaluation of AS/AR and  MS with TEE and/or exercise testing to determine if the patient is  symptomatic.    ASSESSMENT AND PLAN:  CAD: s/p CABG 1995. Now 30 years out, no significant angina.  Cardiac cath in  2012 showed patent grafts.  Myoview at the time showed inferior ischemia. Continue statin. No ASA since on Eliquis  Atrial flutter- s/p  Ablation x 2 in 2016. Recurrent Afibflutter since 2020. Now with Afib and controlled rate. He is asymptomatic.  [place] vasc score of 4. Will continue Eliquis 5 mg bid.   3.  Hyperlipidemia:  At goal on statin. LDL 69   4.   DM per primary care. A1c 6.2%  5.   HTN. Well controlled.  6.  Aortic stenosis severe with some AI. Also has moderate mitral stenosis in part related to significant mitral annular calcification. Not really all that symptomatic but has been inactive. Significant progression from Echo in 2020. Discussed pathophysiology of valvular disease. Given severity I think we should pursue evaluation for possible TAVR. Would treate mitral stenosis medically. Not a good candidate for open heart surgery given age and prior open heart surgery. Will schedule for [facility] next week. The procedure and risks were reviewed including but not limited to death, myocardial infarction, stroke, arrythmias, bleeding, transfusion, emergency surgery, dye allergy, or renal dysfunction. The patient voices understanding and is agreeable to proceed.     Signed, Princess Karnes [place] MD, [facility]

## 2023-04-18 ENCOUNTER — Ambulatory Visit: Attending: Cardiology | Admitting: Cardiology

## 2023-04-18 ENCOUNTER — Other Ambulatory Visit: Payer: Self-pay | Admitting: Cardiology

## 2023-04-18 ENCOUNTER — Encounter: Payer: Self-pay | Admitting: Cardiology

## 2023-04-18 VITALS — BP 175/80 | HR 90 | Ht 71.0 in | Wt 191.0 lb

## 2023-04-18 DIAGNOSIS — I4819 Other persistent atrial fibrillation: Secondary | ICD-10-CM

## 2023-04-18 DIAGNOSIS — I350 Nonrheumatic aortic (valve) stenosis: Secondary | ICD-10-CM | POA: Diagnosis not present

## 2023-04-18 DIAGNOSIS — I342 Nonrheumatic mitral (valve) stenosis: Secondary | ICD-10-CM

## 2023-04-18 DIAGNOSIS — I10 Essential (primary) hypertension: Secondary | ICD-10-CM

## 2023-04-18 DIAGNOSIS — I25708 Atherosclerosis of coronary artery bypass graft(s), unspecified, with other forms of angina pectoris: Secondary | ICD-10-CM | POA: Diagnosis not present

## 2023-04-18 LAB — CBC WITH DIFFERENTIAL/PLATELET

## 2023-04-18 NOTE — Addendum Note (Signed)
 Addended by: Neoma Laming on: 04/18/2023 09:12 AM   Modules accepted: Orders

## 2023-04-18 NOTE — Patient Instructions (Signed)
 Medication Instructions:  Continue all medications *If you need a refill on your cardiac medications before your next appointment, please call your pharmacy*   Lab Work: Bmet,cbc today   Testing/Procedures: Cardiac Cath scheduled at [facility] Tuesday 3/25 Arrive at 10:00 am   Follow-Up: At [facility], you and your health needs are our priority.  As part of our continuing mission to provide you with exceptional heart care, we have created designated Provider Care Teams.  These Care Teams include your primary Cardiologist (physician) and Advanced Practice Providers (APPs -  Physician Assistants and Nurse Practitioners) who all work together to provide you with the care you need, when you need it.  We recommend signing up for the patient portal called "MyChart".  Sign up information is provided on this After Visit Summary.  MyChart is used to connect with patients for Virtual Visits (Telemedicine).  Patients are able to view lab/test results, encounter notes, upcoming appointments, etc.  Non-urgent messages can be sent to your provider as well.   To learn more about what you can do with MyChart, go to [email or web address].    Your next appointment:  After Cath    Provider:  Dr.Jordan    [facility] A DEPT OF [place] AT [facility] [address] [place] [address] Dept: [phone] Loc: [phone]  Bryce Maldonado  04/18/2023  You are scheduled for a Cardiac Catheterization on Tuesday, March 25 with Dr. Peter [place].  1. Please arrive at the [facility] (Main Entrance A) at [facility]: [address] [place], [place] [address] at 10:00 AM (This time is 2 hour(s) before your procedure to ensure your preparation).   Free valet parking service is available. You will check in at ADMITTING. The support person will be asked to wait in the waiting room.  It is OK to have someone drop  you off and come back when you are ready to be discharged.    Special note: Every effort is made to have your procedure done on time. Please understand that emergencies sometimes delay scheduled procedures.  2. Diet: Do not eat solid foods after midnight.  The patient may have clear liquids until 5am upon the day of the procedure.  3. Labs: You will need to have blood drawn on Wed 3/19 at Dr.Jordan's office [facility] You do not need to be fasting.  4. Medication instructions in preparation for your procedure:  Hold Eliquis 2 days before cath  Hold Jardiance 3 days before cath  Hold Glimepiride morning of cath  Hold Metformin morning of cath and Hold 2 days after cath    On the morning of your procedure, take your Aspirin 81 mg and any morning medicines NOT listed above.  You may use sips of water.  5. Plan to go home the same day, you will only stay overnight if medically necessary. 6. Bring a current list of your medications and current insurance cards. 7. You MUST have a responsible person to drive you home. 8. Someone MUST be with you the first 24 hours after you arrive home or your discharge will be delayed. 9. Please wear clothes that are easy to get on and off and wear slip-on shoes.  Thank you for allowing [place] to care for you!   -- [facility]

## 2023-04-19 LAB — CBC WITH DIFFERENTIAL/PLATELET
Basos: 2 %
EOS (ABSOLUTE): 0.1 10*3/uL (ref 0.0–0.2)
Eos: 4 %
Hematocrit: 47 % (ref 37.5–51.0)
Hemoglobin: 15.5 g/dL (ref 13.0–17.7)
Immature Granulocytes: 0 %
Immature Granulocytes: 0 10*3/uL (ref 0.0–0.1)
Lymphs: 18 %
MCH: 30.5 pg (ref 26.6–33.0)
MCHC: 33 g/dL (ref 31.5–35.7)
MCV: 93 fL (ref 79–97)
Monocytes Absolute: 0.2 10*3/uL (ref 0.0–0.4)
Monocytes Absolute: 0.7 10*3/uL (ref 0.1–0.9)
Monocytes: 11 %
Neutrophils Absolute: 1 10*3/uL (ref 0.7–3.1)
Neutrophils Absolute: 3.7 10*3/uL (ref 1.4–7.0)
Neutrophils: 65 %
Platelets: 278 10*3/uL (ref 150–450)
RBC: 5.08 x10E6/uL (ref 4.14–5.80)
RDW: 13.1 % (ref 11.6–15.4)
WBC: 5.7 10*3/uL (ref 3.4–10.8)

## 2023-04-19 LAB — BASIC METABOLIC PANEL
BUN/Creatinine Ratio: 22 (ref 10–24)
BUN: 20 mg/dL (ref 8–27)
CO2: 24 mmol/L (ref 20–29)
Calcium: 10.3 mg/dL — ABNORMAL HIGH (ref 8.6–10.2)
Chloride: 102 mmol/L (ref 96–106)
Creatinine, Ser: 0.92 mg/dL (ref 0.76–1.27)
Glucose: 128 mg/dL — ABNORMAL HIGH (ref 70–99)
Potassium: 5.4 mmol/L — ABNORMAL HIGH (ref 3.5–5.2)
Sodium: 142 mmol/L (ref 134–144)
eGFR: 84 mL/min/{1.73_m2} (ref >59)

## 2023-04-23 ENCOUNTER — Telehealth: Payer: Self-pay | Admitting: *Deleted

## 2023-04-23 NOTE — Telephone Encounter (Addendum)
 Cardiac Catheterization scheduled at [facility] for: Tuesday April 24, 2023 12 Noon Arrival time [facility] Main Entrance A at: 10 AM  Nothing to eat after midnight prior to procedure, clear liquids until 5 AM day of procedure.  Medication instructions: -Hold:  Eliquis-none 04/22/23 until post procedure  Metformin-day of procedure and 48 hours post procedure  Glimepiride/Jardiance-AM of procedure -Other usual morning medications can be taken with sips of water including aspirin 81 mg.  Plan to go home the same day, you will only stay overnight if medically necessary.  You must have responsible adult to drive you home.  Someone must be with you the first 24 hours after you arrive home.  Reviewed procedure instructions with patient.

## 2023-04-24 ENCOUNTER — Encounter (HOSPITAL_COMMUNITY)
Admission: RE | Disposition: A | Discharge status: Home / Self Care | Payer: Self-pay | Source: Home / Self Care | Attending: Cardiology

## 2023-04-24 ENCOUNTER — Other Ambulatory Visit: Payer: Self-pay

## 2023-04-24 ENCOUNTER — Ambulatory Visit (HOSPITAL_COMMUNITY)
Admission: RE | Admit: 2023-04-24 | Discharge: 2023-04-24 | Disposition: A | Discharge status: Home / Self Care | Attending: Cardiology | Admitting: Cardiology

## 2023-04-24 ENCOUNTER — Encounter (HOSPITAL_COMMUNITY): Payer: Self-pay | Admitting: Cardiology

## 2023-04-24 DIAGNOSIS — Z79899 Other long term (current) drug therapy: Secondary | ICD-10-CM | POA: Insufficient documentation

## 2023-04-24 DIAGNOSIS — I350 Nonrheumatic aortic (valve) stenosis: Secondary | ICD-10-CM | POA: Insufficient documentation

## 2023-04-24 DIAGNOSIS — I4891 Unspecified atrial fibrillation: Secondary | ICD-10-CM | POA: Insufficient documentation

## 2023-04-24 DIAGNOSIS — Z8546 Personal history of malignant neoplasm of prostate: Secondary | ICD-10-CM | POA: Diagnosis not present

## 2023-04-24 DIAGNOSIS — E119 Type 2 diabetes mellitus without complications: Secondary | ICD-10-CM | POA: Diagnosis not present

## 2023-04-24 DIAGNOSIS — Z951 Presence of aortocoronary bypass graft: Secondary | ICD-10-CM | POA: Diagnosis not present

## 2023-04-24 DIAGNOSIS — Z87891 Personal history of nicotine dependence: Secondary | ICD-10-CM | POA: Insufficient documentation

## 2023-04-24 DIAGNOSIS — I4892 Unspecified atrial flutter: Secondary | ICD-10-CM | POA: Diagnosis not present

## 2023-04-24 DIAGNOSIS — Z7901 Long term (current) use of anticoagulants: Secondary | ICD-10-CM | POA: Diagnosis not present

## 2023-04-24 DIAGNOSIS — Z7984 Long term (current) use of oral hypoglycemic drugs: Secondary | ICD-10-CM | POA: Insufficient documentation

## 2023-04-24 DIAGNOSIS — I2582 Chronic total occlusion of coronary artery: Secondary | ICD-10-CM | POA: Diagnosis not present

## 2023-04-24 DIAGNOSIS — I272 Pulmonary hypertension, unspecified: Secondary | ICD-10-CM | POA: Diagnosis not present

## 2023-04-24 DIAGNOSIS — E785 Hyperlipidemia, unspecified: Secondary | ICD-10-CM | POA: Insufficient documentation

## 2023-04-24 DIAGNOSIS — I10 Essential (primary) hypertension: Secondary | ICD-10-CM | POA: Diagnosis not present

## 2023-04-24 DIAGNOSIS — I2510 Atherosclerotic heart disease of native coronary artery without angina pectoris: Secondary | ICD-10-CM | POA: Diagnosis not present

## 2023-04-24 HISTORY — PX: RIGHT HEART CATH AND CORONARY/GRAFT ANGIOGRAPHY: CATH118265

## 2023-04-24 LAB — GLUCOSE, CAPILLARY
Glucose-Capillary: 148 mg/dL — ABNORMAL HIGH (ref 70–99)
Glucose-Capillary: 116 mg/dL — ABNORMAL HIGH (ref 70–99)

## 2023-04-24 LAB — POCT I-STAT EG7
Acid-Base Excess: 2 mmol/L (ref 0.0–2.0)
Acid-Base Excess: 3 mmol/L — ABNORMAL HIGH (ref 0.0–2.0)
Bicarbonate: 27.5 mmol/L (ref 20.0–28.0)
Bicarbonate: 28.5 mmol/L — ABNORMAL HIGH (ref 20.0–28.0)
Calcium, Ion: 1.24 mmol/L (ref 1.15–1.40)
Calcium, Ion: 1.25 mmol/L (ref 1.15–1.40)
HCT: 39 % (ref 39.0–52.0)
HCT: 40 % (ref 39.0–52.0)
Hemoglobin: 13.3 g/dL (ref 13.0–17.0)
Hemoglobin: 13.6 g/dL (ref 13.0–17.0)
O2 Saturation: 59 %
O2 Saturation: 63 %
Potassium: 3.9 mmol/L (ref 3.5–5.1)
Potassium: 3.9 mmol/L (ref 3.5–5.1)
Sodium: 139 mmol/L (ref 135–145)
Sodium: 140 mmol/L (ref 135–145)
TCO2: 29 mmol/L (ref 22–32)
TCO2: 30 mmol/L (ref 22–32)
pCO2, Ven: 46.7 mmHg (ref 44–60)
pCO2, Ven: 46.9 mmHg (ref 44–60)
pH, Ven: 7.377 (ref 7.25–7.43)
pH, Ven: 7.391 (ref 7.25–7.43)
pO2, Ven: 32 mmHg (ref 32–45)
pO2, Ven: 33 mmHg (ref 32–45)

## 2023-04-24 LAB — POCT I-STAT 7, (LYTES, BLD GAS, ICA,H+H)
Acid-Base Excess: 2 mmol/L (ref 0.0–2.0)
Bicarbonate: 26.8 mmol/L (ref 20.0–28.0)
Calcium, Ion: 1.19 mmol/L (ref 1.15–1.40)
HCT: 38 % — ABNORMAL LOW (ref 39.0–52.0)
Hemoglobin: 12.9 g/dL — ABNORMAL LOW (ref 13.0–17.0)
O2 Saturation: 92 %
Potassium: 3.8 mmol/L (ref 3.5–5.1)
Sodium: 140 mmol/L (ref 135–145)
TCO2: 28 mmol/L (ref 22–32)
pCO2 arterial: 42.7 mmHg (ref 32–48)
pH, Arterial: 7.406 (ref 7.35–7.45)
pO2, Arterial: 65 mmHg — ABNORMAL LOW (ref 83–108)

## 2023-04-24 SURGERY — RIGHT HEART CATH AND CORONARY/GRAFT ANGIOGRAPHY
Anesthesia: LOCAL

## 2023-04-24 MED ORDER — HEPARIN SODIUM (PORCINE) 1000 UNIT/ML IJ SOLN
INTRAMUSCULAR | Status: AC
  Filled 2023-04-24: qty 10

## 2023-04-24 MED ORDER — HEPARIN SODIUM (PORCINE) 1000 UNIT/ML IJ SOLN
INTRAMUSCULAR | Status: DC | PRN
  Administered 2023-04-24: 4500 [IU] via INTRAVENOUS

## 2023-04-24 MED ORDER — LIDOCAINE HCL (PF) 1 % IJ SOLN
INTRAMUSCULAR | Status: AC
  Filled 2023-04-24: qty 30

## 2023-04-24 MED ORDER — FENTANYL CITRATE (PF) 100 MCG/2ML IJ SOLN
INTRAMUSCULAR | Status: AC
  Filled 2023-04-24: qty 2

## 2023-04-24 MED ORDER — IOHEXOL 350 MG/ML SOLN
INTRAVENOUS | Status: DC | PRN
  Administered 2023-04-24: 90 mL via INTRA_ARTERIAL

## 2023-04-24 MED ORDER — ASPIRIN 81 MG PO CHEW: 81.0000 mg | CHEWABLE_TABLET | ORAL | Status: DC

## 2023-04-24 MED ORDER — SODIUM CHLORIDE 0.9% FLUSH: 3.0000 mL | INTRAVENOUS | Status: DC | PRN

## 2023-04-24 MED ORDER — SODIUM CHLORIDE 0.9% FLUSH: 3.0000 mL | Freq: Two times a day (BID) | INTRAVENOUS | Status: DC

## 2023-04-24 MED ORDER — VERAPAMIL HCL 2.5 MG/ML IV SOLN
INTRAVENOUS | Status: DC | PRN
  Administered 2023-04-24 (×2): 10 mL via INTRA_ARTERIAL

## 2023-04-24 MED ORDER — LIDOCAINE HCL (PF) 1 % IJ SOLN
INTRAMUSCULAR | Status: DC | PRN
  Administered 2023-04-24: 2 mL
  Administered 2023-04-24: 1 mL

## 2023-04-24 MED ORDER — MIDAZOLAM HCL 2 MG/2ML IJ SOLN
INTRAMUSCULAR | Status: DC | PRN
  Administered 2023-04-24 (×2): 1 mg via INTRAVENOUS

## 2023-04-24 MED ORDER — SODIUM CHLORIDE 0.9 % IV SOLN: 250.0000 mL | INTRAVENOUS | Status: DC | PRN

## 2023-04-24 MED ORDER — VERAPAMIL HCL 2.5 MG/ML IV SOLN
INTRAVENOUS | Status: AC
  Filled 2023-04-24: qty 2

## 2023-04-24 MED ORDER — HEPARIN (PORCINE) IN NACL 1000-0.9 UT/500ML-% IV SOLN
INTRAVENOUS | Status: DC | PRN
  Administered 2023-04-24 (×3): 500 mL

## 2023-04-24 MED ORDER — FENTANYL CITRATE (PF) 100 MCG/2ML IJ SOLN
INTRAMUSCULAR | Status: DC | PRN
  Administered 2023-04-24 (×2): 25 ug via INTRAVENOUS

## 2023-04-24 MED ORDER — ONDANSETRON HCL 4 MG/2ML IJ SOLN: 4.0000 mg | Freq: Four times a day (QID) | INTRAMUSCULAR | Status: DC | PRN

## 2023-04-24 MED ORDER — MIDAZOLAM HCL 2 MG/2ML IJ SOLN
INTRAMUSCULAR | Status: AC
  Filled 2023-04-24: qty 2

## 2023-04-24 MED ORDER — ACETAMINOPHEN 325 MG PO TABS: 650.0000 mg | ORAL_TABLET | ORAL | Status: DC | PRN

## 2023-04-24 MED ORDER — METFORMIN HCL 500 MG PO TABS: ORAL_TABLET | ORAL | Status: DC

## 2023-04-24 MED ORDER — SODIUM CHLORIDE 0.9 % IV SOLN
INTRAVENOUS | Status: DC
Start: 2023-04-24 — End: 2023-04-24

## 2023-04-24 SURGICAL SUPPLY — 18 items
CATH INFINITI 5 FR IM (CATHETERS) IMPLANT
CATH INFINITI 5 FR JL3.5 (CATHETERS) IMPLANT
CATH INFINITI 5 FR MPA2 (CATHETERS) IMPLANT
CATH INFINITI 5FR AL1 (CATHETERS) IMPLANT
CATH INFINITI 5FR MULTPACK ANG (CATHETERS) IMPLANT
CATH SWAN GANZ 7F STRAIGHT (CATHETERS) IMPLANT
DEVICE RAD COMP TR BAND LRG (VASCULAR PRODUCTS) IMPLANT
GLIDESHEATH SLEND SS 6F .021 (SHEATH) IMPLANT
GLIDESHEATH SLENDER 7FR .021G (SHEATH) IMPLANT
GUIDEWIRE INQWIRE 1.5J.035X260 (WIRE) IMPLANT
INQWIRE 1.5J .035X260CM (WIRE) ×1 IMPLANT
PACK CARDIAC CATHETERIZATION (CUSTOM PROCEDURE TRAY) ×1 IMPLANT
SET ATX-X65L (MISCELLANEOUS) IMPLANT
SHEATH GLIDE SLENDER 4/5FR (SHEATH) IMPLANT
SHEATH PROBE COVER 6X72 (BAG) IMPLANT
TRANSDUCER W/STOPCOCK (MISCELLANEOUS) IMPLANT
TUBING ART PRESS 72 MALE/FEM (TUBING) IMPLANT
WIRE EMERALD ST .035X260CM (WIRE) IMPLANT

## 2023-04-24 NOTE — Interval H&P Note (Signed)
 History and Physical Interval Note:  04/24/2023 11:26 AM  Bryce Maldonado  has presented today for surgery, with the diagnosis of aortic stenosis.  The various methods of treatment have been discussed with the patient and family. After consideration of risks, benefits and other options for treatment, the patient has consented to  Procedure(s): RIGHT/LEFT HEART CATH AND CORONARY/GRAFT ANGIOGRAPHY (N/A) as a surgical intervention.  The patient's history has been reviewed, patient examined, no change in status, stable for surgery.  I have reviewed the patient's chart and labs.  Questions were answered to the patient's satisfaction.     Theron Arista [facility] 04/24/2023 11:26 AM

## 2023-04-24 NOTE — Discharge Instructions (Addendum)
 May resume Eliquis this evening  Drink plenty of fluid for 48 hours and keep wrist elevated at heart level for 24 hours  Radial Site Care   This sheet gives you information about how to care for yourself after your procedure. Your health care provider may also give you more specific instructions. If you have problems or questions, contact your health care provider. What can I expect after the procedure? After the procedure, it is common to have: Bruising and tenderness at the catheter insertion area. Follow these instructions at home: Medicines Take over-the-counter and prescription medicines only as told by your health care provider. Insertion site care Follow instructions from your health care provider about how to take care of your insertion site. Make sure you: Wash your hands with soap and water before you change your bandage (dressing). If soap and water are not available, use hand sanitizer. remove your dressing as told by your health care provider. In 24 hours Check your insertion site every day for signs of infection. Check for: Redness, swelling, or pain. Fluid or blood. Pus or a bad smell. Warmth. Do not take baths, swim, or use a hot tub until your health care provider approves. You may shower 24-48 hours after the procedure, or as directed by your health care provider. Remove the dressing and gently wash the site with plain soap and water. Pat the area dry with a clean towel. Do not rub the site. That could cause bleeding. Do not apply powder or lotion to the site. Activity   For 24 hours after the procedure, or as directed by your health care provider: Do not flex or bend the affected arm. Do not push or pull heavy objects with the affected arm. Do not drive yourself home from the hospital or clinic. You may drive 24 hours after the procedure unless your health care provider tells you not to. Do not operate machinery or power tools. Do not lift anything that is  heavier than 10 lb (4.5 kg), or the limit that you are told, until your health care provider says that it is safe. For 4 days Ask your health care provider when it is okay to: Return to work or school. Resume usual physical activities or sports. Resume sexual activity. General instructions If the catheter site starts to bleed, raise your arm and put firm pressure on the site. If the bleeding does not stop, get help right away. This is a medical emergency. If you went home on the same day as your procedure, a responsible adult should be with you for the first 24 hours after you arrive home. Keep all follow-up visits as told by your health care provider. This is important. Contact a health care provider if: You have a fever. You have redness, swelling, or yellow drainage around your insertion site. Get help right away if: You have unusual pain at the radial site. The catheter insertion area swells very fast. The insertion area is bleeding, and the bleeding does not stop when you hold steady pressure on the area. Your arm or hand becomes pale, cool, tingly, or numb. These symptoms may represent a serious problem that is an emergency. Do not wait to see if the symptoms will go away. Get medical help right away. Call your local emergency services (911 in the [place] Do not drive yourself to the hospital. Summary After the procedure, it is common to have bruising and tenderness at the site. Follow instructions from your health care provider about how  to take care of your radial site wound. Check the wound every day for signs of infection. Do not lift anything that is heavier than 10 lb (4.5 kg), or the limit that you are told, until your health care provider says that it is safe. This information is not intended to replace advice given to you by your health care provider. Make sure you discuss any questions you have with your health care provider. Document Revised: 02/21/2017 Document Reviewed:  02/21/2017 Elsevier Patient Education  2020 [employer].

## 2023-04-24 NOTE — Progress Notes (Signed)
 Patient and wife was given discharge instructions. Both verbalized understanding.

## 2023-04-25 ENCOUNTER — Telehealth: Payer: Self-pay

## 2023-04-25 NOTE — Telephone Encounter (Signed)
 The patient requested to reschedule appointment to 05/03/2023 with Dr. Lynnette Caffey. He was grateful for call and agreed with plan.

## 2023-04-25 NOTE — Telephone Encounter (Signed)
 Per Dr. [place], called to confirm TAVR consult with Dr. Lynnette Caffey scheduled 3/31 at 0900.  Left message to call back.

## 2023-04-29 NOTE — Progress Notes (Unsigned)
 Patient ID: TRELL SECRIST MRN: [id number] DOB/AGE: 1940-10-13 83 y.o.  Primary Care Physician:Burns, Bobette Mo, MD Primary Cardiologist: [place]  CC:  Aortic valvular disease management     FOCUSED PROBLEM LIST:   AS AVA 0.53, MG 35, V-max 3.7, DI 0.23, EF 65 to 70% TTE March 2025 MS Moderate, MG 11 TTE March 2025 CAD CABG LIMA to LAD, vein graft to OM, vein graft to PDA T2DM On metformin Hypertension Hyperlipidemia Aortic atherosclerosis Lumbar spine x-ray 2023 PAF Status post PVI On Eliquis CKD stage II BMI 26/BSA 2.01 May 2023:  Patient consents to use of AI scribe.*** Patient is an 83 year old male with above listed medical problems referred for recommendations regarding the patient's severe aortic stenosis.  The patient was seen recently and echocardiogram demonstrated progression of aortic valvular disease.  He also has mitral stenosis as well.         Past Medical History:  Diagnosis Date   Anemia    Atrial fibrillation (HCC)    pt has had an ablation   BPH (benign prostatic hyperplasia)    Elevated PSA   Childhood asthma    Coronary artery disease    post CABG x4 -- in 1995 -- due to three-vessel coronary artery disease   Diverticular disease    Dyslipidemia    First degree AV block    Gout    Hypertension    Osteoarthritis    "everywhere" (12/04/2014)   Prostate cancer (HCC) 01/30/2010   [name]   Spinal stenosis 01/30/2002   L4 radiculopathy, herniated nucleus pulposus L4-5   Tinnitus    Type II diabetes mellitus (HCC)    Typical atrial flutter (HCC)    s/p ablation    Past Surgical History:  Procedure Laterality Date   APPENDECTOMY  1943   ATRIAL FLUTTER ABLATION  12/04/2014   BACK SURGERY     CARDIAC CATHETERIZATION  02/10/2010   Est. EF of 60-65% -- Severe three-vessel obstructive atherosclerotic coronary artery disease -- All grafts were patent including left internal mammary artery graft to left anterior descending coronary  artery, saphenous vein graft to the diagonal, saphenous vein graft to obtuse marginal vessel, and saphenous vein graft to the posterior descending coronary artery -- Normal left ventricular function    CATARACT EXTRACTION Bilateral 2023   COLONOSCOPY     negative X3; Dr Leone Payor   CORONARY ARTERY BYPASS GRAFT  10/21/1993   x4 -- using the LIM artery graft to the LAD artery, with saphenous vein grafts to the diagonal branch of the LAD, OM branch the left circumflex coronary artery, and the posterior descending branch of the RCA -- Est. EF of 65%-- Surgeon: Alleen Borne, M.D.               ELECTROPHYSIOLOGIC STUDY N/A 10/01/2014   CTI ablation by Dr Elberta Fortis   ELECTROPHYSIOLOGIC STUDY N/A 12/04/2014   repeat CTI ablation by [name]   FLEXIBLE SIGMOIDOSCOPY  01/31/2003    external hemorrhoids   JOINT REPLACEMENT     LUMBAR LAMINECTOMY/DECOMPRESSION MICRODISCECTOMY Bilateral 08/03/2021   Procedure: Lumbar two-three, Lumbar three-four, Lumbar four-five BILATERAL LAMINOTOMY/FORAMINOTOMY;  Surgeon: Tressie Stalker, MD;  Location: [facility] OR;  Service: Neurosurgery;  Laterality: Bilateral;   MICRODISCECTOMY LUMBAR  01/30/2002   L4-5, Dr. Lovell Sheehan   PROSTATE BIOPSY  10/30/2008   "had radiation tx for prostate cancer"   RIGHT HEART CATH AND CORONARY/GRAFT ANGIOGRAPHY N/A 04/24/2023   Procedure: RIGHT HEART CATH AND CORONARY/GRAFT ANGIOGRAPHY;  Surgeon: [place],  Demetria Pore, MD;  Location: [facility];  Service: Cardiovascular;  Laterality: N/A;   SALIVARY STONE REMOVAL  01/31/1999   TOTAL HIP ARTHROPLASTY Right 11/30/2008   TOTAL HIP ARTHROPLASTY Left 03/02/2010   Dr Charlann Boxer    Family History  Problem Relation Age of Onset   Diabetes Father    Hypertension Father    Lung cancer Father        smoker   Heart failure Mother 58   Hyperlipidemia Sister    Coronary artery disease Sister    Heart attack Paternal Aunt        X 2; both > 65   Colon cancer Neg Hx    Stroke Neg Hx     Social History    Socioeconomic History   Marital status: Married    Spouse name: Marylene Land   Number of children: 2   Years of education: 16   Highest education level: Not on file  Occupational History   Occupation: retired  Tobacco Use   Smoking status: Former    Current packs/day: 0.00    Average packs/day: 2.5 packs/day for 15.0 years (37.5 ttl pk-yrs)    Types: Cigarettes    Start date: 01/31/1955    Quit date: 01/30/1970    Years since quitting: 53.2   Smokeless tobacco: Never   Tobacco comments:    smoked 1959-1972  Vaping Use   Vaping status: Never Used  Substance and Sexual Activity   Alcohol use: Not Currently    Alcohol/week: 2.0 standard drinks of alcohol    Types: 1 Cans of beer, 1 Standard drinks or equivalent per week    Comment: maybe a beer or 1 liquor drink once every 2 weeks   Drug use: No   Sexual activity: Not Currently  Other Topics Concern   Not on file  Social History Narrative   Pt lives in [place] with spouse. 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  Enjoys reading and walking.   Left handed   Town house   Social Drivers of Health   Financial Resource Strain: Low Risk  (11/14/2022)   Overall Financial Resource Strain (CARDIA)    Difficulty of Paying Living Expenses: Not hard at all  Food Insecurity: No Food Insecurity (11/14/2022)   Hunger Vital Sign    Worried About Running Out of Food in the Last Year: Never true    Ran Out of Food in the Last Year: Never true  Transportation Needs: No Transportation Needs (11/14/2022)   PRAPARE - Administrator, Civil Service (Medical): No    Lack of Transportation (Non-Medical): No  Physical Activity: Sufficiently Active (11/14/2022)   Exercise Vital Sign    Days of Exercise per Week: 7 days    Minutes of Exercise per Session: 30 min  Stress: No Stress Concern Present (11/14/2022)   [employer] of Occupational Health - Occupational Stress Questionnaire    Feeling of Stress : Not at all  Social  Connections: Moderately Isolated (11/14/2022)   Social Connection and Isolation Panel [NHANES]    Frequency of Communication with Friends and Family: More than three times a week    Frequency of Social Gatherings with Friends and Family: Twice a week    Attends Religious Services: Never    Database administrator or Organizations: No    Attends Banker Meetings: Never    Marital Status: Married  Catering manager Violence: Not At Risk (11/14/2022)   Humiliation, Afraid, Rape, and Kick questionnaire  Fear of Current or Ex-Partner: No    Emotionally Abused: No    Physically Abused: No    Sexually Abused: No     Prior to Admission medications   Medication Sig Start Date End Date Taking? Authorizing Provider  allopurinol (ZYLOPRIM) 300 MG tablet TAKE 1 TABLET(300 MG) BY MOUTH DAILY 11/22/22   Burns, Bobette Mo, MD  ELIQUIS 5 MG TABS tablet TAKE 1 TABLET(5 MG) BY MOUTH TWICE DAILY 01/26/23   [place], Peter M, MD  ezetimibe (ZETIA) 10 MG tablet TAKE 1 TABLET(10 MG) BY MOUTH AT BEDTIME 10/31/22   [place], Peter M, MD  glimepiride (AMARYL) 2 MG tablet TAKE 1 TABLET(2 MG) BY MOUTH DAILY BEFORE BREAKFAST 12/12/22   Burns, Bobette Mo, MD  JARDIANCE 25 MG TABS tablet TAKE 1 TABLET(25 MG) BY MOUTH DAILY 02/16/23   [name], MD  losartan (COZAAR) 25 MG tablet TAKE 1 TABLET(25 MG) BY MOUTH DAILY 11/22/22   [name], MD  Melatonin 10 MG TABS Take 10 mg by mouth at bedtime.    [provider]  metFORMIN (GLUCOPHAGE) 500 MG tablet TAKE 2 TABLETS(1000 MG) BY MOUTH TWICE DAILY WITH A MEAL 04/27/23   [place], Peter M, MD  Multiple Vitamins-Minerals (CENTRUM PO) Take 1 tablet by mouth daily. Silver    [provider]  OVER THE COUNTER MEDICATION Take 3 capsules by mouth daily. Balance of [employer] and [employer], Historical, MD  rosuvastatin (CRESTOR) 10 MG tablet TAKE 1 TABLET(10 MG) BY MOUTH AT BEDTIME 05/30/22   [place], Peter M, MD  sodium chloride (OCEAN) 0.65 %  SOLN nasal spray Place 1 spray into both nostrils at bedtime.    [provider]  venlafaxine XR (EFFEXOR-XR) 37.5 MG 24 hr capsule TAKE 1 CAPSULE(37.5 MG) BY MOUTH DAILY WITH BREAKFAST 11/07/22   Burns, Bobette Mo, MD    Allergies  Allergen Reactions   Naproxen Sodium Rash    REVIEW OF SYSTEMS:  General: no fevers/chills/night sweats Eyes: no blurry vision, diplopia, or amaurosis ENT: no sore throat or hearing loss Resp: no cough, wheezing, or hemoptysis CV: no edema or palpitations GI: no abdominal pain, nausea, vomiting, diarrhea, or constipation GU: no dysuria, frequency, or hematuria Skin: no rash Neuro: no headache, numbness, tingling, or weakness of extremities Musculoskeletal: no joint pain or swelling Heme: no bleeding, DVT, or easy bruising Endo: no polydipsia or polyuria  There were no vitals taken for this visit.  PHYSICAL EXAM: GEN:  AO x 3 in no acute distress HEENT: normal Dentition: Normal*** Neck: JVP normal. +2***carotid upstrokes without bruits. No thyromegaly. Lungs: equal expansion, clear bilaterally CV: Apex is discrete and nondisplaced, RRR without murmur or gallop*** Abd: soft, non-tender, non-distended; no bruit; positive bowel sounds Ext: no edema, ecchymoses, or cyanosis Vascular: 2+ femoral pulses, 2+ radial pulses       Skin: warm and dry without rash Neuro: CN II-XII grossly intact; motor and sensory grossly intact    DATA AND STUDIES:  EKG: EKG performed March 2025 demonstrates rate controlled atrial fibrillation without bundle-branch blocks  EKG Interpretation Date/Time:    Ventricular Rate:    PR Interval:    QRS Duration:    QT Interval:    QTC Calculation:   R Axis:      Text Interpretation:          Cardiac Studies & Procedures   ______________________________________________________________________________________________ CARDIAC CATHETERIZATION  CARDIAC CATHETERIZATION 04/24/2023  Narrative   Mid LM to Dist LM  lesion  is 70% stenosed.   Prox LAD lesion is 100% stenosed.   Ost Cx to Prox Cx lesion is 95% stenosed.   Ost RCA lesion is 100% stenosed.   RPDA lesion is 100% stenosed.   Prox Graft to Mid Graft lesion is 30% stenosed.   LIMA graft was visualized by angiography and is normal in caliber.   SVG graft was visualized by angiography and is normal in caliber.   SVG graft was visualized by angiography and is normal in caliber.   SVG graft was visualized by angiography and is normal in caliber.   The graft exhibits no disease.   The graft exhibits no disease.   The graft exhibits no disease.   Hemodynamic findings consistent with moderate pulmonary hypertension.  3 vessel occlusive CAD Patent LIMA to the LAD Patent SVG to first diagonal Patent SVG to OM Patent SVG to PDA. Left to right collaterals to the PL and mid RCA Moderate pulmonary HTN. PAP 68/28, mean 42 mm Hg Elevated PCWP 30/45, mean 35 mm Hg Cardiac output 4.22 L/min with index 2.04 Difficult crossing AV with catheter due to significant radial artery spasm.  Plan: plan heart valve team assessment  Findings Coronary Findings Diagnostic  Dominance: Right  Left Main Mid LM to Dist LM lesion is 70% stenosed.  Left Anterior Descending Prox LAD lesion is 100% stenosed.  Left Circumflex Ost Cx to Prox Cx lesion is 95% stenosed.  Right Coronary Artery Ost RCA lesion is 100% stenosed.  Right Ventricular Branch Collaterals RV Branch filled by collaterals from Dist Cx.  Right Posterior Descending Artery RPDA lesion is 100% stenosed.  Right Posterior Atrioventricular Artery Collaterals RPAV filled by collaterals from Dist Cx.  LIMA LIMA Graft To Mid LAD LIMA graft was visualized by angiography and is normal in caliber.  The graft exhibits no disease.  Saphenous Graft To 3rd Mrg SVG graft was visualized by angiography and is normal in caliber.  The graft exhibits no disease.  Saphenous Graft To 1st Diag SVG graft  was visualized by angiography and is normal in caliber.  The graft exhibits no disease.  Saphenous Graft To RPDA SVG graft was visualized by angiography and is normal in caliber. Prox Graft to Mid Graft lesion is 30% stenosed.  Intervention  No interventions have been documented.     ECHOCARDIOGRAM  ECHOCARDIOGRAM COMPLETE 04/12/2023  Narrative ECHOCARDIOGRAM REPORT    Patient Name:   SHAHID FLORI Date of Exam: 04/12/2023 Medical Rec #:  [id number]        Height:       72.0 in Accession #:    [id number]       Weight:       191.0 lb Date of Birth:  1940/04/02         BSA:          2.089 m Patient Age:    82 years         BP:           110/62 mmHg Patient Gender: M                HR:           74 bpm. Exam Location:  Outpatient  Procedure: 2D Echo, 3D Echo, Cardiac Doppler and Color Doppler (Both Spectral and Color Flow Doppler were utilized during procedure).  Indications:    R06.9 DOE; I77.819 Thoracic aorta ectasia  History:        Patient has prior history of Echocardiogram  examinations, most recent 12/31/2018. CAD, Prior CABG and Ablation, Arrythmias:Atrial Flutter and First degree AV Block, Signs/Symptoms:Dyspnea; Risk Factors:Hypertension, Diabetes, Dyslipidemia and Former Smoker. Patient denies chest pain, leg edema, and rheumatic fever as a child. He does have some DOE.  Sonographer:    Carlos American RVT, RDCS (AE), RDMS Referring Phys: 55 PETER M [place]  IMPRESSIONS   1. Left ventricular ejection fraction, by estimation, is 65 to 70%. The left ventricle has normal function. The left ventricle has no regional wall motion abnormalities. There is moderate left ventricular hypertrophy. Left ventricular diastolic function could not be evaluated. 2. Right ventricular systolic function is normal. The right ventricular size is normal. There is mildly elevated pulmonary artery systolic pressure. The estimated right ventricular systolic pressure is 38.5 mmHg. 3. Left  atrial size was mildly dilated. 4. The mitral valve is degenerative and heavily calcified. Mild mitral valve regurgitation. Moderate mitral stenosis. The mean mitral valve gradient is 11.0 mmHg with average heart rate of 73 bpm. 5. The aortic valve is calcified. The left and right coronary cusps appear fixed. There is moderate calcification of the aortic valve. Aortic valve regurgitation is moderate to severe. Severe aortic valve stenosis. Aortic valve area, by VTI measures 0.53 cm. Aortic valve mean gradient measures 35.0 mmHg. Aortic valve Vmax measures 3.70 m/s. Peak gradient 54.8 mmHg, DI 0.23. 6. Aortic dilatation noted. There is borderline dilatation of the aortic root, measuring 38 mm. 7. The inferior vena cava is dilated in size with >50% respiratory variability, suggesting right atrial pressure of 8 mmHg.  Comparison(s): 12/31/2018: LVEF 70%, mild AI, moderate-severe MAC, asc aor 38mm, AOV mean 11.4, peak 23.6 mmHg.  Conclusion(s)/Recommendation(s): Recommend further evaluation of AS/AR and MS with TEE and/or exercise testing to determine if the patient is symptomatic.  FINDINGS Left Ventricle: Left ventricular ejection fraction, by estimation, is 65 to 70%. The left ventricle has normal function. The left ventricle has no regional wall motion abnormalities. 3D ejection fraction reviewed and evaluated as part of the interpretation. Alternate measurement of EF is felt to be most reflective of LV function. The left ventricular internal cavity size was normal in size. There is moderate left ventricular hypertrophy. Left ventricular diastolic function could not be evaluated due to atrial fibrillation. Left ventricular diastolic function could not be evaluated.  Right Ventricle: The right ventricular size is normal. No increase in right ventricular wall thickness. Right ventricular systolic function is normal. There is mildly elevated pulmonary artery systolic pressure. The tricuspid regurgitant  velocity is 2.76 m/s, and with an assumed right atrial pressure of 8 mmHg, the estimated right ventricular systolic pressure is 38.5 mmHg.  Left Atrium: Left atrial size was mildly dilated.  Right Atrium: Right atrial size was normal in size.  Pericardium: There is no evidence of pericardial effusion.  Mitral Valve: The mitral valve is degenerative in appearance. There is severe calcification of the anterior and posterior mitral valve leaflet(s). Mild mitral valve regurgitation. Moderate mitral valve stenosis. MV peak gradient, 28.8 mmHg. The mean mitral valve gradient is 11.0 mmHg with average heart rate of 73 bpm.  Tricuspid Valve: The tricuspid valve is grossly normal. Tricuspid valve regurgitation is trivial.  Aortic Valve: The left and right coronary cusps appear fixed. The aortic valve is calcified. There is moderate calcification of the aortic valve. Aortic valve regurgitation is moderate to severe. Severe aortic stenosis is present. Aortic valve mean gradient measures 35.0 mmHg. Aortic valve peak gradient measures 54.8 mmHg. Aortic valve area, by VTI measures 0.53  cm.  Pulmonic Valve: The pulmonic valve was grossly normal. Pulmonic valve regurgitation is trivial.  Aorta: Aortic dilatation noted. There is borderline dilatation of the aortic root, measuring 38 mm.  Venous: The inferior vena cava is dilated in size with greater than 50% respiratory variability, suggesting right atrial pressure of 8 mmHg.  IAS/Shunts: No atrial level shunt detected by color flow Doppler.  Additional Comments: 3D was performed not requiring image post processing on an independent workstation and was indeterminate.   LEFT VENTRICLE PLAX 2D LVIDd:         4.63 cm   Diastology LVIDs:         2.05 cm   LV e' medial:    5.18 cm/s LV PW:         1.32 cm   LV E/e' medial:  41.3 LV IVS:        1.46 cm   LV e' lateral:   6.64 cm/s LVOT diam:     1.72 cm   LV E/e' lateral: 32.2 LV SV:         46 LV SV  Index:   22 LVOT Area:     2.32 cm  3D Volume EF: 3D EF:        57 % LV EDV:       117 ml LV ESV:       50 ml LV SV:        67 ml  RIGHT VENTRICLE RV S prime:     9.68 cm/s TAPSE (M-mode): 1.5 cm  LEFT ATRIUM              Index        RIGHT ATRIUM           Index LA diam:        4.81 cm  2.30 cm/m   RA Area:     20.90 cm LA Vol (A2C):   105.0 ml 50.26 ml/m  RA Volume:   67.80 ml  32.45 ml/m LA Vol (A4C):   58.1 ml  27.81 ml/m LA Biplane Vol: 78.1 ml  37.38 ml/m AORTIC VALVE                     PULMONIC VALVE AV Area (Vmax):    0.63 cm      PV Vmax:       0.92 m/s AV Area (Vmean):   0.57 cm      PV Peak grad:  3.4 mmHg AV Area (VTI):     0.53 cm AV Vmax:           370.00 cm/s AV Vmean:          279.000 cm/s AV VTI:            0.868 m AV Peak Grad:      54.8 mmHg AV Mean Grad:      35.0 mmHg LVOT Vmax:         99.70 cm/s LVOT Vmean:        68.000 cm/s LVOT VTI:          0.199 m LVOT/AV VTI ratio: 0.23 AR Vena Contracta: 0.46 cm  AORTA Ao Root diam: 3.58 cm Ao Asc diam:  3.59 cm Ao Arch diam: 3.4 cm  MITRAL VALVE                TRICUSPID VALVE MV Area (PHT): 1.39 cm     TR Peak grad:   30.5 mmHg MV Area  VTI:   0.72 cm     TR Vmax:        276.00 cm/s MV Peak grad:  28.8 mmHg MV Mean grad:  11.0 mmHg    SHUNTS MV Vmax:       2.68 m/s     Systemic VTI:  0.20 m MV Vmean:      151.5 cm/s   Systemic Diam: 1.72 cm MV Decel Time: 544 msec MR Peak grad: 128.1 mmHg MR Vmax:      566.00 cm/s MV E velocity: 214.00 cm/s  Zoila Shutter MD Electronically signed by Zoila Shutter MD Signature Date/Time: 04/12/2023/2:48:08 PM    Final          ______________________________________________________________________________________________      01/03/2023: ALT 15; TSH 1.24 04/18/2023: BUN 20; Creatinine, Ser 0.92; Platelets 278 04/24/2023: Hemoglobin 12.9; Potassium 3.8; Sodium 140   STS RISK CALCULATOR: Pending  NHYA CLASS: ***    ASSESSMENT AND PLAN:    1. Nonrheumatic aortic valve stenosis   2. Nonrheumatic mitral valve stenosis   3. S/P CABG x 3   4. Type 2 diabetes mellitus with complication, without long-term current use of insulin (HCC)   5. Hypertension associated with diabetes (HCC)   6. Hyperlipidemia associated with type 2 diabetes mellitus (HCC)   7. Aortic atherosclerosis (HCC)   8. CKD stage 2 due to type 2 diabetes mellitus (HCC)   9. Persistent atrial fibrillation (HCC)   10. Secondary hypercoagulable state (HCC)     Aortic stenosis:  *** Mitral stenosis:*** Status post CABG: Angiography recently demonstrated patent bypass grafts with severe native vessel disease.  Continue Eliquis 5 mg twice daily, rosuvastatin 10 mg. Type 2 diabetes mellitus: Continue Eliquis 5 mg twice daily, Jardiance 25 mg daily, losartan 25 mg daily, rosuvastatin 10 mg daily. Hypertension: Continue losartan 25 mg daily.*** Hyperlipidemia: Continue rosuvastatin 10 mg daily and Zetia 10 mg daily; followed by primary cardiologist. Aortic atherosclerosis: Continue Eliquis 5 mg twice daily, rosuvastatin 10 mg daily, and strict blood pressure control. CKD stage II: Continue losartan 25 mg daily and Jardiance 25 mg daily for renal protection. Persistent atrial fibrillation: Continue Eliquis 5 mg twice daily. Secondary hypercoagulable state: Continue Eliquis 5 mg twice daily.  I have personally reviewed the patients imaging data as summarized above.  I have reviewed the natural history of aortic stenosis with the patient and family members who are present today. We have discussed the limitations of medical therapy and the poor prognosis associated with symptomatic aortic stenosis. We have also reviewed potential treatment options, including palliative medical therapy, conventional surgical aortic valve replacement, and transcatheter aortic valve replacement. We discussed treatment options in the context of this patient's specific comorbid medical conditions.    All of the patient's questions were answered today. Will make further recommendations based on the results of studies outlined above.   I spent *** minutes reviewing all clinical data during and prior to this visit including all relevant imaging studies, laboratories, clinical information from other health systems and prior notes from both Cardiology and other specialties, interviewing the patient, conducting a complete physical examination, and coordinating care in order to formulate a comprehensive and personalized evaluation and treatment plan.   Orbie Pyo, MD  04/29/2023 8:00 PM    [facility] [address] [place], [place], [place]  [address] Phone: [phone]; Fax: [phone]

## 2023-04-30 ENCOUNTER — Ambulatory Visit: Admitting: Internal Medicine

## 2023-05-03 ENCOUNTER — Encounter: Payer: Self-pay | Admitting: Internal Medicine

## 2023-05-03 ENCOUNTER — Ambulatory Visit: Attending: Internal Medicine | Admitting: Internal Medicine

## 2023-05-03 VITALS — BP 138/64 | HR 84 | Ht 71.0 in | Wt 186.4 lb

## 2023-05-03 DIAGNOSIS — D6869 Other thrombophilia: Secondary | ICD-10-CM

## 2023-05-03 DIAGNOSIS — I152 Hypertension secondary to endocrine disorders: Secondary | ICD-10-CM

## 2023-05-03 DIAGNOSIS — I350 Nonrheumatic aortic (valve) stenosis: Secondary | ICD-10-CM

## 2023-05-03 DIAGNOSIS — E1169 Type 2 diabetes mellitus with other specified complication: Secondary | ICD-10-CM | POA: Diagnosis not present

## 2023-05-03 DIAGNOSIS — E118 Type 2 diabetes mellitus with unspecified complications: Secondary | ICD-10-CM

## 2023-05-03 DIAGNOSIS — E1122 Type 2 diabetes mellitus with diabetic chronic kidney disease: Secondary | ICD-10-CM | POA: Diagnosis not present

## 2023-05-03 DIAGNOSIS — E1159 Type 2 diabetes mellitus with other circulatory complications: Secondary | ICD-10-CM | POA: Diagnosis not present

## 2023-05-03 DIAGNOSIS — I342 Nonrheumatic mitral (valve) stenosis: Secondary | ICD-10-CM | POA: Diagnosis not present

## 2023-05-03 DIAGNOSIS — I4819 Other persistent atrial fibrillation: Secondary | ICD-10-CM | POA: Diagnosis not present

## 2023-05-03 DIAGNOSIS — I700 Atherosclerosis of aorta: Secondary | ICD-10-CM

## 2023-05-03 DIAGNOSIS — E785 Hyperlipidemia, unspecified: Secondary | ICD-10-CM

## 2023-05-03 DIAGNOSIS — Z951 Presence of aortocoronary bypass graft: Secondary | ICD-10-CM

## 2023-05-03 DIAGNOSIS — N182 Chronic kidney disease, stage 2 (mild): Secondary | ICD-10-CM

## 2023-05-03 NOTE — Progress Notes (Addendum)
 Pre Surgical Assessment: 5 M Walk Test  30M=16.72ft  5 Meter Walk Test- trial 1: 7.21 seconds 5 Meter Walk Test- trial 2: 8.12 seconds 5 Meter Walk Test- trial 3: 7.62 seconds 5 Meter Walk Test Average: 7.65 seconds  _____________________________  Procedure Type: Isolated AVR Perioperative Outcome Estimate % Operative Mortality 2.16% Morbidity & Mortality 9.53% Stroke 1.84% Renal Failure 1.54% Reoperation 3.18% Prolonged Ventilation 5.73% Deep Sternal Wound Infection 0.147% Long Hospital Stay (>14 days) 5.04% Short Hospital Stay (<6 days)* 38.1% *higher values reflect a better outcome

## 2023-05-03 NOTE — Patient Instructions (Signed)
 Medication Instructions:  No changes *If you need a refill on your cardiac medications before your next appointment, please call your pharmacy*  Lab Work: Today: go to [facility] for blood work (bmp)   Testing/Procedures: CT scans as planned by Structural Heart Team  Follow-Up: Per Structural Heart Team    1st Floor: - Lobby - Registration  - Pharmacy  - Lab - Cafe  2nd Floor: - PV Lab - Diagnostic Testing (echo, CT, nuclear med)  3rd Floor: - Vacant  4th Floor: - TCTS (cardiothoracic surgery) - AFib Clinic - Structural Heart Clinic - Vascular Surgery  - Vascular Ultrasound  5th Floor: - [facility]Care Cardiology (general and EP) - Clinical Pharmacy for coumadin, hypertension, lipid, weight-loss medications, and med management appointments    Valet parking services will be available as well.

## 2023-05-04 ENCOUNTER — Other Ambulatory Visit: Payer: Self-pay

## 2023-05-04 ENCOUNTER — Encounter: Payer: Self-pay | Admitting: Internal Medicine

## 2023-05-04 DIAGNOSIS — I350 Nonrheumatic aortic (valve) stenosis: Secondary | ICD-10-CM

## 2023-05-04 LAB — BASIC METABOLIC PANEL WITH GFR
BUN/Creatinine Ratio: 18 (ref 10–24)
BUN: 14 mg/dL (ref 8–27)
CO2: 23 mmol/L (ref 20–29)
Calcium: 10.2 mg/dL (ref 8.6–10.2)
Chloride: 101 mmol/L (ref 96–106)
Creatinine, Ser: 0.79 mg/dL (ref 0.76–1.27)
Glucose: 145 mg/dL — ABNORMAL HIGH (ref 70–99)
Potassium: 5.1 mmol/L (ref 3.5–5.2)
Sodium: 142 mmol/L (ref 134–144)
eGFR: 89 mL/min/1.73

## 2023-05-08 ENCOUNTER — Other Ambulatory Visit: Payer: Self-pay | Admitting: Cardiology

## 2023-05-11 NOTE — Progress Notes (Signed)
 [address], [address] [place], [place]  [address] Phone: [phone] Fax:  [phone]  Date:  05/11/2023   ID:  Bryce Maldonado, DOB 1940/11/18, MRN [id number]  PCP:  Colene Dauphin, MD  Cardiologist:  Dr. Marigrace Mccole [place]     History of Present Illness: Bryce Maldonado is a 83 y.o. male is seen for evaluation of progressive aortic stenosis.   He has a hx of CAD, s/p CABG in 1995, HL, prostate CA, DJD, spinal stenosis. In 03/2010 he underwent a Lexiscan Myoview. This demonstrated an EF of 66% and inferior ischemia. LHC 01/2010: all grafts patent.  EF 60-65%. He was treated medically.   In 2016 he had new onset atrial flutter. He was anticoagulated and underwent Ablation by Dr. Nunzio Belch on October 01, 2014. He had repeat ablation on Nov. 4 2016.  On his visit 2020 he was noted to be in atrial flutter with controlled rate. He was asymptomatic. Started on Eliquis. Treated with rate control only.   More recently noted worsening AS murmur and echo done indicating severe aortic stenosis with mean AV gradient of 35 mm Hg with valve area 0.53 cm squared. There was also severe mitral annular calcification. With moderate MS.   Cardiac cath showed patent bypass grafts with moderate pulmonary HTN, elevated PCWP 35 mm Hg. Unable to cross AV due to radial artery spasm. He has since been seen in our valve clinic by Dr Lorie Rook and TAVR evaluation is underway.    Wt Readings from Last 3 Encounters:  05/03/23 186 lb 6.4 oz (84.6 kg)  04/24/23 191 lb (86.6 kg)  04/18/23 191 lb (86.6 kg)     Past Medical History:  Diagnosis Date   Anemia    Atrial fibrillation (HCC)    pt has had an ablation   BPH (benign prostatic hyperplasia)    Elevated PSA   Childhood asthma    Coronary artery disease    post CABG x4 -- in 1995 -- due to three-vessel coronary artery disease   Diverticular disease    Dyslipidemia    First degree AV block    Gout    Hypertension    Osteoarthritis     "everywhere" (12/04/2014)   Prostate cancer (HCC) 01/30/2010   Dr Grady Lawman   Spinal stenosis 01/30/2002   L4 radiculopathy, herniated nucleus pulposus L4-5   Tinnitus    Type II diabetes mellitus (HCC)    Typical atrial flutter (HCC)    s/p ablation    Current Outpatient Medications  Medication Sig Dispense Refill   allopurinol (ZYLOPRIM) 300 MG tablet TAKE 1 TABLET(300 MG) BY MOUTH DAILY 90 tablet 1   ELIQUIS 5 MG TABS tablet TAKE 1 TABLET(5 MG) BY MOUTH TWICE DAILY 180 tablet 3   ezetimibe (ZETIA) 10 MG tablet TAKE 1 TABLET(10 MG) BY MOUTH AT BEDTIME 90 tablet 3   glimepiride (AMARYL) 2 MG tablet TAKE 1 TABLET(2 MG) BY MOUTH DAILY BEFORE BREAKFAST 90 tablet 1   JARDIANCE 25 MG TABS tablet TAKE 1 TABLET(25 MG) BY MOUTH DAILY 90 tablet 2   losartan (COZAAR) 25 MG tablet TAKE 1 TABLET(25 MG) BY MOUTH DAILY 90 tablet 1   Melatonin 10 MG TABS Take 10 mg by mouth at bedtime.     metFORMIN (GLUCOPHAGE) 500 MG tablet TAKE 2 TABLETS(1000 MG) BY MOUTH TWICE DAILY WITH A MEAL     Multiple Vitamins-Minerals (CENTRUM PO) Take 1 tablet by mouth daily. Silver     OVER THE COUNTER MEDICATION  Take 3 capsules by mouth daily. Balance of Nature Fruits and Veggies     rosuvastatin (CRESTOR) 10 MG tablet TAKE 1 TABLET(10 MG) BY MOUTH AT BEDTIME 90 tablet 3   sodium chloride (OCEAN) 0.65 % SOLN nasal spray Place 1 spray into both nostrils at bedtime.     venlafaxine XR (EFFEXOR-XR) 37.5 MG 24 hr capsule TAKE 1 CAPSULE(37.5 MG) BY MOUTH DAILY WITH BREAKFAST 90 capsule 3   No current facility-administered medications for this visit.    Allergies:    Allergies  Allergen Reactions   Naproxen Sodium Rash    Social History:  The patient  reports that he quit smoking about 53 years ago. His smoking use included cigarettes. He started smoking about 68 years ago. He has a 37.5 pack-year smoking history. He has never used smokeless tobacco. He reports that he does not currently use alcohol after a past usage of  about 2.0 standard drinks of alcohol per week. He reports that he does not use drugs.   ROS:  Please see the history of present illness.      All other systems reviewed and negative.   PHYSICAL EXAM: VS:  There were no vitals taken for this visit. GENERAL:  Well appearing WM in NAD HEENT:  PERRL, EOMI, sclera are clear. Oropharynx is clear. NECK:  No jugular venous distention, carotid upstroke brisk and symmetric, no bruits, no thyromegaly or adenopathy LUNGS:  Clear to auscultation bilaterally CHEST:  Unremarkable HEART:  IRRR,  PMI not displaced or sustained,S1 and S2 within normal limits, no S3, no S4: no clicks, no rubs, harsh gr 3/6 systolic murmur RUSB to apex. ABD:  Soft, nontender. BS +, no masses or bruits. No hepatomegaly, no splenomegaly EXT:  2 + pulses throughout, no edema, no cyanosis no clubbing SKIN:  Warm and dry.  No rashes NEURO:  Alert and oriented x 3. Cranial nerves II through XII intact. PSYCH:  Cognitively intact    Laboratory data:  Lab Results  Component Value Date   WBC 5.7 04/18/2023   HGB 12.9 (L) 04/24/2023   HCT 38.0 (L) 04/24/2023   PLT 278 04/18/2023   GLUCOSE 145 (H) 05/03/2023   CHOL 144 01/03/2023   TRIG 109.0 01/03/2023   HDL 53.30 01/03/2023   LDLDIRECT 86.2 06/24/2010   LDLCALC 69 01/03/2023   ALT 15 01/03/2023   AST 15 01/03/2023   NA 142 05/03/2023   K 5.1 05/03/2023   CL 101 05/03/2023   CREATININE 0.79 05/03/2023   BUN 14 05/03/2023   CO2 23 05/03/2023   TSH 1.24 01/03/2023   PSA 6.12 (H) 08/06/2006   INR 0.97 03/04/2010   HGBA1C 6.2 01/03/2023   MICROALBUR 1.1 01/03/2023         Echo 12/31/18:IMPRESSIONS     1. Left ventricular ejection fraction, by visual estimation, is 70 to  75%. The left ventricle has hyperdynamic function. There is no left  ventricular hypertrophy.   2. Left ventricular diastolic function could not be evaluated.   3. Global right ventricle has normal systolic function.The right  ventricular  size is normal. No increase in right ventricular wall  thickness.   4. Left atrial size was mildly dilated.   5. Right atrial size was normal.   6. Moderate to severe aortic valve annular calcification.   7. Moderate to severe mitral annular calcification.   8. The mitral valve is grossly normal. Mild mitral valve regurgitation.   9. The tricuspid valve is normal in structure. Tricuspid  valve  regurgitation is mild.  10. The aortic valve has an indeterminant number of cusps. Aortic valve  regurgitation is mild.  11. The pulmonic valve was normal in structure. Pulmonic valve  regurgitation is trivial.  12. Aortic dilatation noted.  13. There is mild dilatation of the ascending aorta.  14. Normal pulmonary artery systolic pressure.  15. The atrial septum is grossly normal.    Echo 04/12/23: IMPRESSIONS     1. Left ventricular ejection fraction, by estimation, is 65 to 70%. The  left ventricle has normal function. The left ventricle has no regional  wall motion abnormalities. There is moderate left ventricular hypertrophy.  Left ventricular diastolic function   could not be evaluated.   2. Right ventricular systolic function is normal. The right ventricular  size is normal. There is mildly elevated pulmonary artery systolic  pressure. The estimated right ventricular systolic pressure is 38.5 mmHg.   3. Left atrial size was mildly dilated.   4. The mitral valve is degenerative and heavily calcified. Mild mitral  valve regurgitation. Moderate mitral stenosis. The mean mitral valve  gradient is 11.0 mmHg with average heart rate of 73 bpm.   5. The aortic valve is calcified. The left and right coronary cusps  appear fixed. There is moderate calcification of the aortic valve. Aortic  valve regurgitation is moderate to severe. Severe aortic valve stenosis.  Aortic valve area, by VTI measures  0.53 cm. Aortic valve mean gradient measures 35.0 mmHg. Aortic valve Vmax  measures 3.70 m/s.  Peak gradient 54.8 mmHg, DI 0.23.   6. Aortic dilatation noted. There is borderline dilatation of the aortic  root, measuring 38 mm.   7. The inferior vena cava is dilated in size with >50% respiratory  variability, suggesting right atrial pressure of 8 mmHg.   Comparison(s): 12/31/2018: LVEF 70%, mild AI, moderate-severe MAC, asc aor  38mm, AOV mean 11.4, peak 23.6 mmHg.   Conclusion(s)/Recommendation(s): Recommend further evaluation of AS/AR and  MS with TEE and/or exercise testing to determine if the patient is  symptomatic.   Cardiac cath 04/24/23:  RIGHT HEART CATH AND CORONARY/GRAFT ANGIOGRAPHY   Conclusion      Mid LM to Dist LM lesion is 70% stenosed.   Prox LAD lesion is 100% stenosed.   Ost Cx to Prox Cx lesion is 95% stenosed.   Ost RCA lesion is 100% stenosed.   RPDA lesion is 100% stenosed.   Prox Graft to Mid Graft lesion is 30% stenosed.   LIMA graft was visualized by angiography and is normal in caliber.   SVG graft was visualized by angiography and is normal in caliber.   SVG graft was visualized by angiography and is normal in caliber.   SVG graft was visualized by angiography and is normal in caliber.   The graft exhibits no disease.   The graft exhibits no disease.   The graft exhibits no disease.   Hemodynamic findings consistent with moderate pulmonary hypertension.   3 vessel occlusive CAD Patent LIMA to the LAD Patent SVG to first diagonal Patent SVG to OM Patent SVG to PDA. Left to right collaterals to the PL and mid RCA Moderate pulmonary HTN. PAP 68/28, mean 42 mm Hg Elevated PCWP 30/45, mean 35 mm Hg Cardiac output 4.22 L/min with index 2.04 Difficult crossing AV with catheter due to significant radial artery spasm.    Plan: plan heart valve team assessment     ASSESSMENT AND PLAN:  CAD: s/p CABG 1995. Now 30  years out, no significant angina.  Cardiac cath recently showed patent grafts.   Continue statin. No ASA since on Eliquis  Atrial  flutter- s/p  Ablation x 2 in 2016. Recurrent Afibflutter since 2020. Now with Afib and controlled rate. He is asymptomatic.  [place] vasc score of 4. Will continue Eliquis 5 mg bid.   3.  Hyperlipidemia:  At goal on statin. LDL 69   4.   DM per primary care. A1c 6.2%  5.   HTN. Well controlled.  6.  Aortic stenosis severe with some AI. Cardiac cath results as noted. Has been seen in valve clinic with plans for TAVR. Will treat mitral stenosis medically.      Signed, Rontrell Moquin [place] MD, [facility]

## 2023-05-16 ENCOUNTER — Encounter: Payer: Self-pay | Admitting: Cardiology

## 2023-05-16 ENCOUNTER — Ambulatory Visit: Attending: Cardiology | Admitting: Cardiology

## 2023-05-16 VITALS — BP 142/70 | HR 94 | Ht 71.0 in | Wt 193.4 lb

## 2023-05-16 DIAGNOSIS — Z951 Presence of aortocoronary bypass graft: Secondary | ICD-10-CM

## 2023-05-16 DIAGNOSIS — I342 Nonrheumatic mitral (valve) stenosis: Secondary | ICD-10-CM

## 2023-05-16 DIAGNOSIS — I4819 Other persistent atrial fibrillation: Secondary | ICD-10-CM

## 2023-05-16 DIAGNOSIS — I350 Nonrheumatic aortic (valve) stenosis: Secondary | ICD-10-CM | POA: Diagnosis not present

## 2023-05-16 NOTE — Patient Instructions (Addendum)
 Medication Instructions:  Continue same medications *If you need a refill on your cardiac medications before your next appointment, please call your pharmacy*  Lab Work: None ordered  Testing/Procedures: None ordered  Follow-Up: At [facility], you and your health needs are our priority.  As part of our continuing mission to provide you with exceptional heart care, our providers are all part of one team.  This team includes your primary Cardiologist (physician) and Advanced Practice Providers or APPs (Physician Assistants and Nurse Practitioners) who all work together to provide you with the care you need, when you need it.  Your next appointment:  4 months    Thursday 8/7 at 1:20 pm    Provider:  Dr.Jordan   We recommend signing up for the patient portal called "MyChart".  Sign up information is provided on this After Visit Summary.  MyChart is used to connect with patients for Virtual Visits (Telemedicine).  Patients are able to view lab/test results, encounter notes, upcoming appointments, etc.  Non-urgent messages can be sent to your provider as well.   To learn more about what you can do with MyChart, go to [email or web address].         1st Floor: - Lobby - Registration  - Pharmacy  - Lab - Cafe  2nd Floor: - PV Lab - Diagnostic Testing (echo, CT, nuclear med)  3rd Floor: - Vacant  4th Floor: - TCTS (cardiothoracic surgery) - AFib Clinic - Structural Heart Clinic - Vascular Surgery  - Vascular Ultrasound  5th Floor: - HeartCare Cardiology (general and EP) - Clinical Pharmacy for coumadin, hypertension, lipid, weight-loss medications, and med management appointments    Valet parking services will be available as well.

## 2023-05-17 ENCOUNTER — Other Ambulatory Visit: Payer: Self-pay | Admitting: Cardiology

## 2023-05-17 ENCOUNTER — Other Ambulatory Visit: Payer: Self-pay | Admitting: Internal Medicine

## 2023-05-21 ENCOUNTER — Other Ambulatory Visit: Payer: Self-pay

## 2023-05-21 MED ORDER — METOPROLOL TARTRATE 50 MG PO TABS: ORAL_TABLET | ORAL | 0 refills | Status: DC

## 2023-05-25 ENCOUNTER — Other Ambulatory Visit: Payer: Self-pay | Admitting: Internal Medicine

## 2023-06-07 ENCOUNTER — Ambulatory Visit (HOSPITAL_COMMUNITY)
Admission: RE | Admit: 2023-06-07 | Discharge: 2023-06-07 | Disposition: A | Discharge status: Home / Self Care | Source: Ambulatory Visit | Attending: Cardiology | Admitting: Cardiology

## 2023-06-07 DIAGNOSIS — I2510 Atherosclerotic heart disease of native coronary artery without angina pectoris: Secondary | ICD-10-CM | POA: Diagnosis not present

## 2023-06-07 DIAGNOSIS — Z01818 Encounter for other preprocedural examination: Secondary | ICD-10-CM | POA: Diagnosis not present

## 2023-06-07 DIAGNOSIS — K8020 Calculus of gallbladder without cholecystitis without obstruction: Secondary | ICD-10-CM | POA: Diagnosis not present

## 2023-06-07 DIAGNOSIS — I70202 Unspecified atherosclerosis of native arteries of extremities, left leg: Secondary | ICD-10-CM | POA: Diagnosis not present

## 2023-06-07 DIAGNOSIS — I350 Nonrheumatic aortic (valve) stenosis: Secondary | ICD-10-CM | POA: Diagnosis not present

## 2023-06-07 MED ORDER — METOPROLOL TARTRATE 5 MG/5ML IV SOLN
10.0000 mg | Freq: Once | INTRAVENOUS | Status: DC | PRN
Start: 2023-06-07 — End: 2023-06-08

## 2023-06-07 MED ORDER — IOHEXOL 350 MG/ML SOLN
100.0000 mL | Freq: Once | INTRAVENOUS | Status: AC | PRN
  Administered 2023-06-07: 100 mL via INTRAVENOUS

## 2023-06-07 MED ORDER — DILTIAZEM HCL 25 MG/5ML IV SOLN: 10.0000 mg | INTRAVENOUS | Status: DC | PRN

## 2023-06-09 ENCOUNTER — Other Ambulatory Visit: Payer: Self-pay | Admitting: Internal Medicine

## 2023-06-27 ENCOUNTER — Ambulatory Visit: Attending: Surgery | Admitting: Surgery

## 2023-06-27 VITALS — BP 148/69 | HR 91 | Resp 18 | Ht 71.0 in | Wt 195.0 lb

## 2023-06-27 DIAGNOSIS — I350 Nonrheumatic aortic (valve) stenosis: Secondary | ICD-10-CM

## 2023-06-27 NOTE — Progress Notes (Signed)
 Patient ID: Bryce Maldonado, male   DOB: 1940/05/24, 83 y.o.   MRN: [id number]  [facility]   MULTIDISCIPLINARY HEART VALVE CLINIC            CARDIOTHORACIC SURGERY CONSULTATION REPORT  PCP is Colene Dauphin, MD Referring Provider is Alyssa Backbone, MD Primary Cardiologist is Peter Swaziland, MD  Reason for consultation:  Severe aortic stenosis  HPI:  The patient is an 83 year old gentleman with history of type 2 diabetes, hypertension, hyperlipidemia, paroxysmal atrial fibrillation status post ablation on Eliquis , stage II chronic kidney disease, coronary artery disease status post CABG x 4 in 1995, moderate mitral stenosis and severe aortic stenosis who was referred for consideration of TAVR.  Over the past few months he has had progressive exertional fatigue and shortness of breath with walking and going up stairs.  His most recent echo on 04/12/2023 showed a calcified and thickened aortic valve with restricted leaflet mobility.  The mean gradient was 35 mmHg with a peak gradient of 55 mmHg and a valve area by VTI of 0.53 cm.  There was also moderate to severe aortic insufficiency.  There was moderate mitral stenosis with mean gradient of 11 mmHg at peak of 29 mmHg.  Left ventricular ejection fraction was 65 to 70%.  Past Medical History:  Diagnosis Date   Anemia    Atrial fibrillation (HCC)    pt has had an ablation   BPH (benign prostatic hyperplasia)    Elevated PSA   Childhood asthma    Coronary artery disease    post CABG x4 -- in 1995 -- due to three-vessel coronary artery disease   Diverticular disease    Dyslipidemia    First degree AV block    Gout    Hypertension    Osteoarthritis    "everywhere" (12/04/2014)   Prostate cancer (HCC) 01/30/2010   Dr Grady Lawman   Spinal stenosis 01/30/2002   L4 radiculopathy, herniated nucleus pulposus L4-5   Tinnitus    Type II diabetes mellitus (HCC)    Typical atrial flutter (HCC)    s/p ablation    Past Surgical  History:  Procedure Laterality Date   APPENDECTOMY  1943   ATRIAL FLUTTER ABLATION  12/04/2014   BACK SURGERY     CARDIAC CATHETERIZATION  02/10/2010   Est. EF of 60-65% -- Severe three-vessel obstructive atherosclerotic coronary artery disease -- All grafts were patent including left internal mammary artery graft to left anterior descending coronary artery, saphenous vein graft to the diagonal, saphenous vein graft to obtuse marginal vessel, and saphenous vein graft to the posterior descending coronary artery -- Normal left ventricular function    CATARACT EXTRACTION Bilateral 2023   COLONOSCOPY     negative X3; Dr Willy Harvest   CORONARY ARTERY BYPASS GRAFT  10/21/1993   x4 -- using the LIM artery graft to the LAD artery, with saphenous vein grafts to the diagonal branch of the LAD, OM branch the left circumflex coronary artery, and the posterior descending branch of the RCA -- Est. EF of 65%-- Surgeon: Bartley Lightning, M.D.               ELECTROPHYSIOLOGIC STUDY N/A 10/01/2014   CTI ablation by Dr Lawana Pray   ELECTROPHYSIOLOGIC STUDY N/A 12/04/2014   repeat CTI ablation by Dr Nunzio Belch   FLEXIBLE SIGMOIDOSCOPY  01/31/2003    external hemorrhoids   JOINT REPLACEMENT     LUMBAR LAMINECTOMY/DECOMPRESSION MICRODISCECTOMY Bilateral 08/03/2021   Procedure: Lumbar two-three, Lumbar three-four, Lumbar  four-five BILATERAL LAMINOTOMY/FORAMINOTOMY;  Surgeon: Garry Kansas, MD;  Location: [facility] OR;  Service: Neurosurgery;  Laterality: Bilateral;   MICRODISCECTOMY LUMBAR  01/30/2002   L4-5, Dr. Larrie Po   PROSTATE BIOPSY  10/30/2008   "had radiation tx for prostate cancer"   RIGHT HEART CATH AND CORONARY/GRAFT ANGIOGRAPHY N/A 04/24/2023   Procedure: RIGHT HEART CATH AND CORONARY/GRAFT ANGIOGRAPHY;  Surgeon: Swaziland, Peter M, MD;  Location: [facility] INVASIVE CV LAB;  Service: Cardiovascular;  Laterality: N/A;   SALIVARY STONE REMOVAL  01/31/1999   TOTAL HIP ARTHROPLASTY Right 11/30/2008   TOTAL HIP ARTHROPLASTY Left  03/02/2010   Dr Bernard Brick    Family History  Problem Relation Age of Onset   Diabetes Father    Hypertension Father    Lung cancer Father        smoker   Heart failure Mother 63   Hyperlipidemia Sister    Coronary artery disease Sister    Heart attack Paternal Aunt        X 2; both > 65   Colon cancer Neg Hx    Stroke Neg Hx     Social History   Socioeconomic History   Marital status: Married    Spouse name: Shelvy Dickens   Number of children: 2   Years of education: 16   Highest education level: Not on file  Occupational History   Occupation: retired  Tobacco Use   Smoking status: Former    Current packs/day: 0.00    Average packs/day: 2.5 packs/day for 15.0 years (37.5 ttl pk-yrs)    Types: Cigarettes    Start date: 01/31/1955    Quit date: 01/30/1970    Years since quitting: 53.4   Smokeless tobacco: Never   Tobacco comments:    smoked 1959-1972  Vaping Use   Vaping status: Never Used  Substance and Sexual Activity   Alcohol  use: Not Currently    Alcohol /week: 2.0 standard drinks of alcohol     Types: 1 Cans of beer, 1 Standard drinks or equivalent per week    Comment: maybe a beer or 1 liquor drink once every 2 weeks   Drug use: No   Sexual activity: Not Currently  Other Topics Concern   Not on file  Social History Narrative   Pt lives in [place] with spouse. 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  Enjoys reading and walking.   Left handed   Town house   Social Drivers of Health   Financial Resource Strain: Low Risk  (11/14/2022)   Overall Financial Resource Strain (CARDIA)    Difficulty of Paying Living Expenses: Not hard at all  Food Insecurity: No Food Insecurity (11/14/2022)   Hunger Vital Sign    Worried About Running Out of Food in the Last Year: Never true    Ran Out of Food in the Last Year: Never true  Transportation Needs: No Transportation Needs (11/14/2022)   PRAPARE - Administrator, Civil Service (Medical): No    Lack of Transportation  (Non-Medical): No  Physical Activity: Sufficiently Active (11/14/2022)   Exercise Vital Sign    Days of Exercise per Week: 7 days    Minutes of Exercise per Session: 30 min  Stress: No Stress Concern Present (11/14/2022)   [employer] of Occupational Health - Occupational Stress Questionnaire    Feeling of Stress : Not at all  Social Connections: Moderately Isolated (11/14/2022)   Social Connection and Isolation Panel [NHANES]    Frequency of Communication with Friends and Family: More than three times  a week    Frequency of Social Gatherings with Friends and Family: Twice a week    Attends Religious Services: Never    Database administrator or Organizations: No    Attends Banker Meetings: Never    Marital Status: Married  Catering manager Violence: Not At Risk (11/14/2022)   Humiliation, Afraid, Rape, and Kick questionnaire    Fear of Current or Ex-Partner: No    Emotionally Abused: No    Physically Abused: No    Sexually Abused: No    Prior to Admission medications   Medication Sig Start Date End Date Taking? Authorizing Provider  allopurinol  (ZYLOPRIM ) 300 MG tablet TAKE 1 TABLET(300 MG) BY MOUTH DAILY 05/17/23  Yes Burns, Beckey Bourgeois, MD  ELIQUIS  5 MG TABS tablet TAKE 1 TABLET(5 MG) BY MOUTH TWICE DAILY 01/26/23  Yes Sw[place], Peter M, MD  ezetimibe  (ZETIA ) 10 MG tablet TAKE 1 TABLET(10 MG) BY MOUTH AT BEDTIME 05/08/23  Yes Thukkani, Arun K, MD  glimepiride  (AMARYL ) 2 MG tablet TAKE 1 TABLET(2 MG) BY MOUTH DAILY BEFORE BREAKFAST 06/11/23  Yes Burns, Beckey Bourgeois, MD  JARDIANCE  25 MG TABS tablet TAKE 1 TABLET(25 MG) BY MOUTH DAILY 02/16/23  Yes Burns, Beckey Bourgeois, MD  losartan  (COZAAR ) 25 MG tablet TAKE 1 TABLET(25 MG) BY MOUTH DAILY 05/17/23  Yes Burns, Beckey Bourgeois, MD  Melatonin 10 MG TABS Take 10 mg by mouth at bedtime.   Yes [provider]  metFORMIN  (GLUCOPHAGE ) 500 MG tablet TAKE 2 TABLETS(1000 MG) BY MOUTH TWICE DAILY WITH A MEAL 05/25/23  Yes Burns, Beckey Bourgeois, MD   metoprolol  tartrate (LOPRESSOR ) 50 MG tablet Take one tablet by mouth as directed prior to 5/8 CT scan 05/21/23  Yes Thukkani, Arun K, MD  Multiple Vitamins-Minerals (CENTRUM PO) Take 1 tablet by mouth daily. Silver   Yes [provider]  OVER THE COUNTER MEDICATION Take 3 capsules by mouth daily. Balance of [employer] and Veggies   Yes [provider]  rosuvastatin  (CRESTOR ) 10 MG tablet TAKE 1 TABLET(10 MG) BY MOUTH AT BEDTIME 05/17/23  Yes [place], Peter M, MD  sodium chloride  (OCEAN) 0.65 % SOLN nasal spray Place 1 spray into both nostrils at bedtime.   Yes [provider]  venlafaxine  XR (EFFEXOR -XR) 37.5 MG 24 hr capsule TAKE 1 CAPSULE(37.5 MG) BY MOUTH DAILY WITH BREAKFAST 11/07/22  Yes Burns, Beckey Bourgeois, MD    Current Outpatient Medications  Medication Sig Dispense Refill   allopurinol  (ZYLOPRIM ) 300 MG tablet TAKE 1 TABLET(300 MG) BY MOUTH DAILY 90 tablet 1   ELIQUIS  5 MG TABS tablet TAKE 1 TABLET(5 MG) BY MOUTH TWICE DAILY 180 tablet 3   ezetimibe  (ZETIA ) 10 MG tablet TAKE 1 TABLET(10 MG) BY MOUTH AT BEDTIME 90 tablet 3   glimepiride  (AMARYL ) 2 MG tablet TAKE 1 TABLET(2 MG) BY MOUTH DAILY BEFORE BREAKFAST 90 tablet 1   JARDIANCE  25 MG TABS tablet TAKE 1 TABLET(25 MG) BY MOUTH DAILY 90 tablet 2   losartan  (COZAAR ) 25 MG tablet TAKE 1 TABLET(25 MG) BY MOUTH DAILY 90 tablet 1   Melatonin 10 MG TABS Take 10 mg by mouth at bedtime.     metFORMIN  (GLUCOPHAGE ) 500 MG tablet TAKE 2 TABLETS(1000 MG) BY MOUTH TWICE DAILY WITH A MEAL 360 tablet 1   metoprolol  tartrate (LOPRESSOR ) 50 MG tablet Take one tablet by mouth as directed prior to 5/8 CT scan 1 tablet 0   Multiple Vitamins-Minerals (CENTRUM PO) Take 1 tablet by mouth daily.  Silver     OVER THE COUNTER MEDICATION Take 3 capsules by mouth daily. Balance of [employer]ruits and Veggies     rosuvastatin  (CRESTOR ) 10 MG tablet TAKE 1 TABLET(10 MG) BY MOUTH AT BEDTIME 90 tablet 3   sodium chloride  (OCEAN) 0.65 % SOLN nasal  spray Place 1 spray into both nostrils at bedtime.     venlafaxine  XR (EFFEXOR -XR) 37.5 MG 24 hr capsule TAKE 1 CAPSULE(37.5 MG) BY MOUTH DAILY WITH BREAKFAST 90 capsule 3   No current facility-administered medications for this visit.    Allergies  Allergen Reactions   Naproxen Sodium Rash      Review of Systems:   General:  normal appetite, + decreased energy, + weight gain, no weight loss, no fever  Cardiac:  no chest pain with exertion, no chest pain at rest, + SOB with moderate exertion, mp resting SOB, mp PND, mp orthopnea, mp palpitations, + arrhythmia, + atrial fibrillation, no LE edema, no dizzy spells, no syncope  Respiratory:  + exertional shortness of breath, no home oxygen, no productive cough, no dry cough, no bronchitis, no wheezing, no hemoptysis, no asthma, no pain with inspiration or cough, no sleep apnea, no CPAP at night  GI:   no difficulty swallowing, no reflux, no frequent heartburn, no hiatal hernia, no abdominal pain, no constipation, no diarrhea, no hematochezia, no hematemesis, no melena  GU:   no dysuria,  no frequency, no urinary tract infection, no hematuria, no enlarged prostate, no kidney stones, + chronic kidney disease  Vascular:  no pain suggestive of claudication, no pain in feet, no leg cramps, no varicose veins, no DVT, no non-healing foot ulcer  Neuro:   no stroke, no TIA's, no seizures, no headaches, no temporary blindness one eye,  no slurred speech, + peripheral neuropathy, no chronic pain, no instability of gait, no memory/cognitive dysfunction  Musculoskeletal: + arthritis, no joint swelling, no myalgias, no difficulty walking, normal mobility   Skin:   no rash, no itching, no skin infections, no pressure sores or ulcerations  Psych:   no anxiety, no depression, no nervousness, no unusual recent stress  Eyes:   no blurry vision, no floaters, no recent vision changes, + wears glasses   ENT:   no hearing loss, no loose or painful teeth, no dentures,  last saw dentist Dec 2024  Hematologic:  no easy bruising, no abnormal bleeding, no clotting disorder, no frequent epistaxis  Endocrine:  + diabetes, does not check CBG's at home     Physical Exam:   BP (!) 148/69   Pulse 91   Resp 18   Ht 5\' 11"  (1.803 m)   Wt 195 lb (88.5 kg)   SpO2 95% Comment: RA  BMI 27.20 kg/m   General:  well-appearing  HEENT:  Unremarkable, NCAT, PERLA, EOMI  Neck:   no JVD, no bruits, no adenopathy   Chest:   clear to auscultation, symmetrical breath sounds, no wheezes, no rhonchi  CV:   RRR, 3/6 systolic murmur RSB, no diastolic murmur  Abdomen:  soft, non-tender, no masses   Extremities:  warm, well-perfused, pedal pulses palpable, no lower extremity edema  Rectal/GU  Deferred  Neuro:   Grossly non-focal and symmetrical throughout  Skin:   Clean and dry, no rashes, no breakdown  Diagnostic Tests:  ECHOCARDIOGRAM REPORT       Patient Name:   Josiephine Nightingale Date of Exam: 04/12/2023  Medical Rec #:  [id number]        Height:  72.0 in  Accession #:    [id number]       Weight:       191.0 lb  Date of Birth:  13-Apr-1940         BSA:          2.089 m  Patient Age:    82 years         BP:           110/62 mmHg  Patient Gender: M                HR:           74 bpm.  Exam Location:  Outpatient   Procedure: 2D Echo, 3D Echo, Cardiac Doppler and Color Doppler (Both  Spectral            and Color Flow Doppler were utilized during procedure).   Indications:    R06.9 DOE; I77.819 Thoracic aorta ectasia    History:        Patient has prior history of Echocardiogram examinations,  most                 recent 12/31/2018. CAD, Prior CABG and Ablation,                  Arrythmias:Atrial Flutter and First degree AV Block,                  Signs/Symptoms:Dyspnea; Risk Factors:Hypertension,  Diabetes,                 Dyslipidemia and Former Smoker. Patient denies chest pain,  leg                 edema, and rheumatic fever as a child. He does have some   DOE.    Sonographer:   Richarda Chance RVT, RDCS (AE), RDMS  Referring Phys: 73 PETER M Sw[place]and   IMPRESSIONS     1. Left ventricular ejection fraction, by estimation, is 65 to 70%. The  left ventricle has normal function. The left ventricle has no regional  wall motion abnormalities. There is moderate left ventricular hypertrophy.  Left ventricular diastolic function   could not be evaluated.   2. Right ventricular systolic function is normal. The right ventricular  size is normal. There is mildly elevated pulmonary artery systolic  pressure. The estimated right ventricular systolic pressure is 38.5 mmHg.   3. Left atrial size was mildly dilated.   4. The mitral valve is degenerative and heavily calcified. Mild mitral  valve regurgitation. Moderate mitral stenosis. The mean mitral valve  gradient is 11.0 mmHg with average heart rate of 73 bpm.   5. The aortic valve is calcified. The left and right coronary cusps  appear fixed. There is moderate calcification of the aortic valve. Aortic  valve regurgitation is moderate to severe. Severe aortic valve stenosis.  Aortic valve area, by VTI measures  0.53 cm. Aortic valve mean gradient measures 35.0 mmHg. Aortic valve Vmax  measures 3.70 m/s. Peak gradient 54.8 mmHg, DI 0.23.   6. Aortic dilatation noted. There is borderline dilatation of the aortic  root, measuring 38 mm.   7. The inferior vena cava is dilated in size with >50% respiratory  variability, suggesting right atrial pressure of 8 mmHg.   Comparison(s): 12/31/2018: LVEF 70%, mild AI, moderate-severe MAC, asc aor  38mm, AOV mean 11.4, peak 23.6 mmHg.   Conclusion(s)/Recommendation(s): Recommend further evaluation of AS/AR and  MS with TEE and/or exercise testing  to determine if the patient is  symptomatic.   FINDINGS   Left Ventricle: Left ventricular ejection fraction, by estimation, is 65  to 70%. The left ventricle has normal function. The left ventricle has no   regional wall motion abnormalities. 3D ejection fraction reviewed and  evaluated as part of the  interpretation. Alternate measurement of EF is felt to be most reflective  of LV function. The left ventricular internal cavity size was normal in  size. There is moderate left ventricular hypertrophy. Left ventricular  diastolic function could not be  evaluated due to atrial fibrillation. Left ventricular diastolic function  could not be evaluated.   Right Ventricle: The right ventricular size is normal. No increase in  right ventricular wall thickness. Right ventricular systolic function is  normal. There is mildly elevated pulmonary artery systolic pressure. The  tricuspid regurgitant velocity is 2.76   m/s, and with an assumed right atrial pressure of 8 mmHg, the estimated  right ventricular systolic pressure is 38.5 mmHg.   Left Atrium: Left atrial size was mildly dilated.   Right Atrium: Right atrial size was normal in size.   Pericardium: There is no evidence of pericardial effusion.   Mitral Valve: The mitral valve is degenerative in appearance. There is  severe calcification of the anterior and posterior mitral valve  leaflet(s). Mild mitral valve regurgitation. Moderate mitral valve  stenosis. MV peak gradient, 28.8 mmHg. The mean  mitral valve gradient is 11.0 mmHg with average heart rate of 73 bpm.   Tricuspid Valve: The tricuspid valve is grossly normal. Tricuspid valve  regurgitation is trivial.   Aortic Valve: The left and right coronary cusps appear fixed. The aortic  valve is calcified. There is moderate calcification of the aortic valve.  Aortic valve regurgitation is moderate to severe. Severe aortic stenosis  is present. Aortic valve mean  gradient measures 35.0 mmHg. Aortic valve peak gradient measures 54.8  mmHg. Aortic valve area, by VTI measures 0.53 cm.   Pulmonic Valve: The pulmonic valve was grossly normal. Pulmonic valve  regurgitation is trivial.    Aorta: Aortic dilatation noted. There is borderline dilatation of the  aortic root, measuring 38 mm.   Venous: The inferior vena cava is dilated in size with greater than 50%  respiratory variability, suggesting right atrial pressure of 8 mmHg.   IAS/Shunts: No atrial level shunt detected by color flow Doppler.   Additional Comments: 3D was performed not requiring image post processing  on an independent workstation and was indeterminate.     LEFT VENTRICLE  PLAX 2D  LVIDd:         4.63 cm   Diastology  LVIDs:         2.05 cm   LV e' medial:    5.18 cm/s  LV PW:         1.32 cm   LV E/e' medial:  41.3  LV IVS:        1.46 cm   LV e' lateral:   6.64 cm/s  LVOT diam:     1.72 cm   LV E/e' lateral: 32.2  LV SV:         46  LV SV Index:   22  LVOT Area:     2.32 cm                             3D Volume EF:  3D EF:        57 %                           LV EDV:       117 ml                           LV ESV:       50 ml                           LV SV:        67 ml   RIGHT VENTRICLE  RV S prime:     9.68 cm/s  TAPSE (M-mode): 1.5 cm   LEFT ATRIUM              Index        RIGHT ATRIUM           Index  LA diam:        4.81 cm  2.30 cm/m   RA Area:     20.90 cm  LA Vol (A2C):   105.0 ml 50.26 ml/m  RA Volume:   67.80 ml  32.45 ml/m  LA Vol (A4C):   58.1 ml  27.81 ml/m  LA Biplane Vol: 78.1 ml  37.38 ml/m   AORTIC VALVE                     PULMONIC VALVE  AV Area (Vmax):    0.63 cm      PV Vmax:       0.92 m/s  AV Area (Vmean):   0.57 cm      PV Peak grad:  3.4 mmHg  AV Area (VTI):     0.53 cm  AV Vmax:           370.00 cm/s  AV Vmean:          279.000 cm/s  AV VTI:            0.868 m  AV Peak Grad:      54.8 mmHg  AV Mean Grad:      35.0 mmHg  LVOT Vmax:         99.70 cm/s  LVOT Vmean:        68.000 cm/s  LVOT VTI:          0.199 m  LVOT/AV VTI ratio: 0.23  AR Vena Contracta: 0.46 cm    AORTA  Ao Root diam: 3.58 cm  Ao Asc diam:   3.59 cm  Ao Arch diam: 3.4 cm   MITRAL VALVE                TRICUSPID VALVE  MV Area (PHT): 1.39 cm     TR Peak grad:   30.5 mmHg  MV Area VTI:   0.72 cm     TR Vmax:        276.00 cm/s  MV Peak grad:  28.8 mmHg  MV Mean grad:  11.0 mmHg    SHUNTS  MV Vmax:       2.68 m/s     Systemic VTI:  0.20 m  MV Vmean:      151.5 cm/s   Systemic Diam: 1.72 cm  MV Decel Time: 544 msec  MR Peak grad: 128.1 mmHg  MR Vmax:      566.00 cm/s  MV E velocity: 214.00 cm/s   Dinah Franco MD  Electronically signed by Dinah Franco MD  Signature Date/Time: 04/12/2023/2:48:08 PM        Final      hysicians  Panel Physicians Referring Physician Case Authorizing Physician  [place], Peter M, MD (Primary)     Procedures  RIGHT HEART CATH AND CORONARY/GRAFT ANGIOGRAPHY   Conclusion      Mid LM to Dist LM lesion is 70% stenosed.   Prox LAD lesion is 100% stenosed.   Ost Cx to Prox Cx lesion is 95% stenosed.   Ost RCA lesion is 100% stenosed.   RPDA lesion is 100% stenosed.   Prox Graft to Mid Graft lesion is 30% stenosed.   LIMA graft was visualized by angiography and is normal in caliber.   SVG graft was visualized by angiography and is normal in caliber.   SVG graft was visualized by angiography and is normal in caliber.   SVG graft was visualized by angiography and is normal in caliber.   The graft exhibits no disease.   The graft exhibits no disease.   The graft exhibits no disease.   Hemodynamic findings consistent with moderate pulmonary hypertension.   3 vessel occlusive CAD Patent LIMA to the LAD Patent SVG to first diagonal Patent SVG to OM Patent SVG to PDA. Left to right collaterals to the PL and mid RCA Moderate pulmonary HTN. PAP 68/28, mean 42 mm Hg Elevated PCWP 30/45, mean 35 mm Hg Cardiac output 4.22 L/min with index 2.04 Difficult crossing AV with catheter due to significant radial artery spasm.    Plan: plan heart valve team assessment      Indications  Nonrheumatic aortic valve stenosis [I35.0 (ICD-10-CM)]   Procedural Details  Technical Details Indication: 83 yo WM s/p remote CABG presents with progressive severe aortic stenosis.  Procedural Details: The left wrist was prepped, draped, and anesthetized with 1% lidocaine . Ultrasound was used to guide access. Image was obtained and stored in the record.  Using the modified Seldinger technique a 6 Fr slender sheath was placed in the left radial artery and a 7 French sheath was placed in the right brachial vein. A Swan-Ganz catheter was used for the right heart catheterization. Standard protocol was followed for recording of right heart pressures and sampling of oxygen saturations. Fick cardiac output was calculated. Standard Judkins catheters were used for selective coronary and graft angiography. While I was able to cross the AV with a wire I was unable to pass a catheter across mainly due to  spasm in the forearm- despite extra sedation, verapamil , and a MP catheter. There were no immediate procedural complications. The patient was transferred to the post catheterization recovery area for further monitoring. Contrast: 90 cc   Estimated blood loss <50 mL.   During this procedure medications were administered to achieve and maintain moderate conscious sedation while the patient's heart rate, blood pressure, and oxygen saturation were continuously monitored and I was present face-to-face 100% of this time. Logan Boothe Rad Tech and [facility] Cardiovascular Specialist are independent, trained observers who assisted in the monitoring of the patient's level of consciousness.   Medications (Filter: Administrations occurring from 1138 to 1304 on 04/24/23) Heparin  (Porcine) in NaCl 1000-0.9 UT/500ML-% SOLN (mL)  Total volume: 1,500 mL Date/Time Rate/Dose/Volume Action   04/24/23 1148 500 mL Given   1148 500 mL Given   1158 500 mL Given   midazolam  (VERSED ) injection (mg)   Total dose: 2 mg Date/Time  Rate/Dose/Volume Action   04/24/23 1207 1 mg Given   1251 1 mg Given   fentaNYL  (SUBLIMAZE ) injection (mcg)  Total dose: 50 mcg Date/Time Rate/Dose/Volume Action   04/24/23 1207 25 mcg Given   1251 25 mcg Given   lidocaine  (PF) (XYLOCAINE ) 1 % injection (mL)  Total volume: 3 mL Date/Time Rate/Dose/Volume Action   04/24/23 1208 1 mL Given   1215 2 mL Given   Radial Cocktail/Verapamil  only (mL)  Total volume: 20 mL Date/Time Rate/Dose/Volume Action   04/24/23 1217 10 mL Given   1250 10 mL Given   heparin  sodium (porcine) injection (Units)  Total dose: 4,500 Units Date/Time Rate/Dose/Volume Action   04/24/23 1226 4,500 Units Given   iohexol  (OMNIPAQUE ) 350 MG/ML injection (mL)  Total volume: 90 mL Date/Time Rate/Dose/Volume Action   04/24/23 1303 90 mL Given    Sedation Time  Sedation Time Physician-1: 45 minutes 25 seconds Radiation/Fluoro  Fluoro time: 17 (min) DAP: 25.3 (Gycm2) Cumulative Air Kerma: 404.4 (mGy) Complications  Complications documented before study signed (04/24/2023  1:08 PM)   No complications were associated with this study.  Documented by Maudie Sorrow, RT - 04/24/2023 12:55 PM     Coronary Findings  Diagnostic Dominance: Right Left Main  Mid LM to Dist LM lesion is 70% stenosed.    Left Anterior Descending  Prox LAD lesion is 100% stenosed.    Left Circumflex  Ost Cx to Prox Cx lesion is 95% stenosed.    Right Coronary Artery  Ost RCA lesion is 100% stenosed.    Right Ventricular Branch  Collaterals  RV Branch filled by collaterals from Dist Cx.      Right Posterior Descending Artery  RPDA lesion is 100% stenosed.    Right Posterior Atrioventricular Artery  Collaterals  RPAV filled by collaterals from Dist Cx.      LIMA LIMA Graft To Mid LAD  LIMA graft was visualized by angiography and is normal in caliber. The graft exhibits no disease.    Saphenous Graft To 3rd Mrg  SVG graft was  visualized by angiography and is normal in caliber. The graft exhibits no disease.    Saphenous Graft To 1st Diag  SVG graft was visualized by angiography and is normal in caliber. The graft exhibits no disease.    Saphenous Graft To RPDA  SVG graft was visualized by angiography and is normal in caliber.  Prox Graft to Mid Graft lesion is 30% stenosed.    Intervention   No interventions have been documented.   Right Heart  Right Heart Pressures Hemodynamic findings consistent with moderate pulmonary hypertension. Elevated LV EDP consistent with volume overload.   Coronary Diagrams  Diagnostic Dominance: Right  Intervention   Implants   No implant documentation for this case.   Syngo Images   Show images for CARDIAC CATHETERIZATION Images on Long Term Storage   Show images for Jemery, Stacey "Al Hachey" Link to Procedure Log  Procedure Log    Hemodynamics  Pressures Phases Resting  Right     RA Mean  mmHg 15    RA A-Wave  mmHg 19    RA V-Wave  mmHg 17  Pulmonary     PA  mmHg 68/28 (42)    PCW Mean  mmHg 35.0    PCW A-Wave  mmHg 30.0    PCW V-Wave  mmHg 45.0    PAPi   2.7    Saturations Phases Resting    PA  % 61  Arterial  % 92    Hemo Data  Flowsheet Row Most Recent Value  Fick Cardiac Output 4.22 L/min  Fick Cardiac Output Index 2.04 (L/min)/BSA  RA A Wave 19 mmHg  RA V Wave 17 mmHg  RA Mean 15 mmHg  RV Systolic Pressure 69 mmHg  RV Diastolic Pressure 6 mmHg  RV EDP 15 mmHg  PA Systolic Pressure 68 mmHg  PA Diastolic Pressure 28 mmHg  PA Mean 42 mmHg  PW A Wave 30 mmHg  PW V Wave 45 mmHg  PW Mean 35 mmHg  AO Systolic Pressure 136 mmHg  AO Diastolic Pressure 51 mmHg  AO Mean 84 mmHg  QP/QS 1  TPVR Index 20.64 HRUI  TSVR Index 41.27 HRUI  PVR SVR Ratio 0.1  TPVR/TSVR Ratio 0.5   ADDENDUM REPORT: 06/16/2023 17:36   EXAM: OVER-READ INTERPRETATION  CT CHEST   The following report is an over-read performed by radiologist  Dr. Cyndia Drape [facility] on 06/16/2023. This over-read does not include interpretation of cardiac or coronary anatomy or pathology. The coronary CTA interpretation by the cardiologist is attached.   COMPARISON:  None.   FINDINGS: Cardiovascular: See findings discussed in the body of the report. Atheromatous calcifications.   Mediastinum/Nodes: No suspicious adenopathy identified. Imaged mediastinal structures are unremarkable.   Lungs/Pleura: There is dependent basilar subsegmental atelectasis. No pneumonia or pulmonary edema. No pleural effusion or pneumothorax.   Upper Abdomen: No acute abnormality.   Musculoskeletal: No chest wall abnormality. No acute osseous findings.   IMPRESSION: No acute extracardiac incidental findings.     Electronically Signed   By: Sydell Eva M.D.   On: 06/16/2023 17:36    Addended by Osborn Blaze, MD on 06/16/2023  5:38 PM    Study Result  Narrative & Impression  MEDICATIONS: MEDICATIONS None   EXAM: Cardiac TAVR CT   TECHNIQUE: The patient was scanned on a GE apex scanner. 1 beat acquisition triggered in the descending thoracic aorta at 110 HU's. A non contrast, gated CT scan was obtained first with axial slices of 2.5 mm through the heart for valve scoring. A 120 kV retrospective, gated, contrast scan done with gantry rotation speed of 230 msec and collimation 0.63 mm. A delayed scan was obtained to exclude LAA thrombus. The 3D data set was reconstructed in 5% intervals of the R-R cycle. Best systolic phase was motion corrected Images were analyzed on a dedicated workstation using MPR, MIP and VRT modes The patient received 100 cc of contrast   FINDINGS: Aortic Valve: Tri leaflet markedly calcified with score 5694   Aorta: Normal arch vessels no aneurysm moderate calcific atherosclerosis   Sinotubular Junction: 30 mm   Ascending Thoracic Aorta: 34 mm   Aortic Arch: 29 mm   Descending  Thoracic Aorta: 22 mm   Sinus of Valsalva Measurements:   Non-coronary: 35.8 mm  Height 25.7 mm   Right - coronary: 35.6 mm  Height 24.4 mm   Left - coronary: 36.4 mm  Height 20.6 mm   Coronary Artery Height above Annulus:   Left Main: 15.3 mm above annulus   Right Coronary: 22.4 mm above annulus   Virtual Basal Annulus Measurements:   Maximum/Minimum Diameter: 28.1 mm x 20.9 mm   Perimeter: 79.7 mm   Area: 475 mm2   Coronary Arteries: Suitable height above annulus for deployment   Patent SVG to PDA, OM and Diagonal   Petent LIMA to LAD   Optimum Fluoroscopic Angle for Delivery: RAO 6  Caudal 4 degrees   Membranous septal length 8.7 mm but calcified   IMPRESSION: 1.  Tri leaflet AV with calcium  score 5694   2. Annular area of 475 mm2 suitable for a 26 mm Sapien valve Alternatively a 29 mm Medtronic valve   3. Nodular calcific protrusion to the annulus at the non coronary cusp measuring 6.5 mm extending 1.5 cm into the LVOT along the intervalvular fibrosa. Increased risk of significant peri valvular leak post deployment   4. Coronary arteries sufficient height above annulus for deployment Patent SVG;s to PDA, Diagonal, OM and patent LIMA to LAD   5.  Membranous septal length 8.7 mm but calcified   6.  Severe posterior MAC   7.  Optimum angiographic angle for deployment RAO 6 Caudal 4 degrees   Will need review by structural team   Janelle Mediate   Electronically Signed: By: Janelle Mediate M.D. On: 06/07/2023 11:03     EXAMINATION: CT ANGIO ABDOMEN PELVIS W & WO CONTRAST (accession [id number] [facility]), CT ANGIO CHEST AORTA W & OR WO CONTRAST (accession [id number] [facility])   CLINICAL INDICATION: Male, 83 years old. Aortic valve replacement (TAVR), pre-op eval   TECHNIQUE: Axial CTA of the chest, abdomen and pelvis with and without 100 cc Omnipaque  300 intravenous contrast. Multiplanar and 3D reformations provided. Unless otherwise specified, incidental thyroid ,  adrenal, renal lesions do not require dedicated imaging follow up. Additionally, any mentioned pulmonary nodules do not require dedicated imaging follow-up based on the Fleischner guidelines unless otherwise specified. Coronary calcifications are not identified unless otherwise specified.   COMPARISON:   FINDINGS:   The thoracic aorta is nonaneurysmal. Scattered atherosclerotic changes are present. There is a usual 3 branch aortic arch. The origins of the great vessels are patent. The visualized thyroid  is normal. The main pulmonary artery is mildly dilated. Left atrium is enlarged. There are coronary calcifications with post-CABG changes. There are calcifications of the aortic valve. There is no free fluid or pathologic lymphadenopathy by size criteria. The trachea and mainstem bronchi appear patent. Subsegmental atelectatic changes are noted within the lung bases. The lungs are otherwise clear.   The abdominal aorta is normal in caliber. Scattered atherosclerotic changes are present. The celiac artery and SMA appear patent. The renal arteries and IMA appear patent. The right common iliac artery is patent. There is greater than 85% stenosis of the left common iliac artery.   The right internal iliac artery is patent. There is greater than 50% stenosis of the proximal left internal iliac artery. The external iliac arteries are patent. The common femoral arteries and visualized portions of the superficial and deep femoral arteries are patent.   The visualized liver appears normal. There is cholelithiasis. The spleen is normal. The pancreas appears normal. The adrenals are normal. Small bilateral renal cysts are noted. The bladder is normal. The prostate is enlarged and contains markers. Large and small bowel loops are otherwise within normal limits. No free fluid or adenopathy. Small fat-containing bilateral hernias are seen.   Bilateral hip arthroplasties are noted. There are  degenerative changes of the spine.   IMPRESSION:   Greater than 85% stenosis of the left common iliac artery.   Otherwise this is a preprocedural CTA with various additional findings as above.   DOSE REDUCTION: This exam was performed according to our departmental dose-optimization program which includes automated exposure control, adjustment of the mA and/or kV according to patient size and/or use of iterative reconstruction technique.   Electronically signed by: [place] Engel  MD 06/07/2023 01:25 PM EDT RP Workstation: [id number]   Impression:  This 83 year old gentleman has stage D, severe, symptomatic aortic stenosis with NYHA class II symptoms of exertional fatigue and shortness of breath consistent with chronic diastolic congestive heart failure.  I have personally reviewed his 2D echocardiogram, cardiac catheterization, and CTA studies.  His echocardiogram in March 2025 showed severe aortic stenosis with a mean gradient of 35 mmHg and a valve area by VTI of 0.53 with and a dimensionless index of 0.33.  There was moderate to severe aortic insufficiency.  The patient also has moderate mitral stenosis and normal left ventricular systolic function. Cardiac catheterization showed severe three-vessel coronary disease with 4/4 patent bypass grafts.  There is moderate pulmonary hypertension at 68/28 with a mean 42 mmHg.  Wedge pressure was elevated to 35 mean.  Cardiac index was 2.4.  I agree that aortic valve replacement is indicated in this patient for relief of his symptoms and to prevent progressive left ventricular dysfunction.  He does have significant moderate mitral stenosis with a mean gradient of 11 mmHg but is not a candidate for open double valve placement.  I think his aortic stenosis should be treated with continued medical therapy for his mitral stenosis.  Given his age and prior coronary bypass surgery I do not think he is a candidate for open surgical treatment and TAVR is felt to be  the best option for treating him.  His gated cardiac CTA shows anatomy suitable for TAVR using a 26 mm SAPIEN 3 valve.  His abdominal and pelvic CTA shows adequate pelvic vascular anatomy to allow right transfemoral insertion.  The patient and his wife were counseled at length regarding treatment alternatives for management of severe symptomatic aortic stenosis. The risks and benefits of surgical intervention has been discussed in detail. Long-term prognosis with medical therapy was discussed. Alternative approaches such as conventional surgical aortic valve replacement, transcatheter aortic valve replacement, and palliative medical therapy were compared and contrasted at length. This discussion was placed in the context of the patient's own specific clinical presentation and past medical history. All of their questions have been addressed.   Following the decision to proceed with transcatheter aortic valve replacement, a discussion was held regarding what types of management strategies would be attempted intraoperatively in the event of life-threatening complications, including whether or not the patient would be considered a candidate for the use of cardiopulmonary bypass and/or conversion to open sternotomy for attempted surgical intervention.  I do not think he is a candidate for sternotomy to manage any intraoperative complications and is not a candidate for cardiopulmonary bypass.  The patient has been advised of a variety of complications that might develop including but not limited to risks of death, stroke, paravalvular leak, aortic dissection or other major vascular complications, aortic annulus rupture, device embolization, cardiac rupture or perforation, mitral regurgitation, acute myocardial infarction, arrhythmia, heart block or bradycardia requiring permanent pacemaker placement, congestive heart failure, respiratory failure, renal failure, pneumonia, infection, other late complications related to  structural valve deterioration or migration, or other complications that might ultimately cause a temporary or permanent loss of functional independence or other long term morbidity. The patient provides full informed consent for the procedure as described and all questions were answered.     Plan:  He will be scheduled for transfemoral TAVR using a SAPIEN 3 valve on 07/17/2023.  He will need to stop his Eliquis  at least 2 days prior to surgery.  I spent 60 minutes performing  this consultation and > 50% of this time was spent face to face counseling and coordinating the care of this patient's severe symptomatic aortic stenosis.   Bartley Lightning, MD 06/27/2023

## 2023-06-29 ENCOUNTER — Ambulatory Visit

## 2023-07-03 ENCOUNTER — Encounter: Payer: Self-pay | Admitting: Internal Medicine

## 2023-07-03 NOTE — Patient Instructions (Addendum)
      Blood work was ordered.       Medications changes include :   None    A referral was ordered and someone will call you to schedule an appointment.     Return in about 6 months (around 01/03/2024) for Physical Exam.

## 2023-07-03 NOTE — Progress Notes (Unsigned)
 Subjective:    Patient ID: Bryce Maldonado, male    DOB: 07-Jan-1941, 83 y.o.   MRN: [id number]     HPI Bryce Maldonado is here for follow up of his chronic medical problems.  Has no concerns.  He will be having a TAVR in 2 weeks.  He does state no energy and was told it was related to the valve.  Medications and allergies reviewed with patient and updated if appropriate.  Current Outpatient Medications on File Prior to Visit  Medication Sig Dispense Refill   allopurinol  (ZYLOPRIM ) 300 MG tablet TAKE 1 TABLET(300 MG) BY MOUTH DAILY 90 tablet 1   ELIQUIS  5 MG TABS tablet TAKE 1 TABLET(5 MG) BY MOUTH TWICE DAILY 180 tablet 3   ezetimibe  (ZETIA ) 10 MG tablet TAKE 1 TABLET(10 MG) BY MOUTH AT BEDTIME 90 tablet 3   glimepiride  (AMARYL ) 2 MG tablet TAKE 1 TABLET(2 MG) BY MOUTH DAILY BEFORE BREAKFAST 90 tablet 1   JARDIANCE  25 MG TABS tablet TAKE 1 TABLET(25 MG) BY MOUTH DAILY 90 tablet 2   losartan  (COZAAR ) 25 MG tablet TAKE 1 TABLET(25 MG) BY MOUTH DAILY 90 tablet 1   Melatonin 10 MG TABS Take 10 mg by mouth at bedtime.     metFORMIN  (GLUCOPHAGE ) 500 MG tablet TAKE 2 TABLETS(1000 MG) BY MOUTH TWICE DAILY WITH A MEAL 360 tablet 1   Multiple Vitamins-Minerals (CENTRUM PO) Take 1 tablet by mouth daily. Silver     OVER THE COUNTER MEDICATION Take 3 capsules by mouth daily. Balance of Nature Fruits and Veggies     rosuvastatin  (CRESTOR ) 10 MG tablet TAKE 1 TABLET(10 MG) BY MOUTH AT BEDTIME 90 tablet 3   venlafaxine  XR (EFFEXOR -XR) 37.5 MG 24 hr capsule TAKE 1 CAPSULE(37.5 MG) BY MOUTH DAILY WITH BREAKFAST 90 capsule 3   No current facility-administered medications on file prior to visit.     Review of Systems  Constitutional:  Positive for fatigue. Negative for fever.  Respiratory:  Negative for cough, shortness of breath and wheezing.   Cardiovascular:  Negative for chest pain, palpitations and leg swelling.  Neurological:  Negative for light-headedness and headaches.       Objective:    Vitals:   07/04/23 0957  BP: 124/72  Pulse: (!) 56  Temp: 98.1 F (36.7 C)  SpO2: 96%   BP Readings from Last 3 Encounters:  07/04/23 124/72  06/27/23 (!) 148/69  06/07/23 (!) 148/64   Wt Readings from Last 3 Encounters:  07/04/23 190 lb (86.2 kg)  06/27/23 195 lb (88.5 kg)  05/16/23 193 lb 6.4 oz (87.7 kg)   Body mass index is 26.5 kg/m.    Physical Exam Constitutional:      General: He is not in acute distress.    Appearance: Normal appearance. He is not ill-appearing.  HENT:     Head: Normocephalic and atraumatic.  Eyes:     Conjunctiva/sclera: Conjunctivae normal.  Cardiovascular:     Rate and Rhythm: Normal rate and regular rhythm.     Heart sounds: Murmur (2/6 high pitched systolic murmur) heard.  Pulmonary:     Effort: Pulmonary effort is normal. No respiratory distress.     Breath sounds: Normal breath sounds. No wheezing or rales.  Musculoskeletal:     Right lower leg: No edema.     Left lower leg: No edema.  Skin:    General: Skin is warm and dry.     Findings: No rash.  Neurological:  Mental Status: He is alert. Mental status is at baseline.  Psychiatric:        Mood and Affect: Mood normal.        Lab Results  Component Value Date   WBC 5.7 04/18/2023   HGB 12.9 (L) 04/24/2023   HCT 38.0 (L) 04/24/2023   PLT 278 04/18/2023   GLUCOSE 145 (H) 05/03/2023   CHOL 144 01/03/2023   TRIG 109.0 01/03/2023   HDL 53.30 01/03/2023   LDLDIRECT 86.2 06/24/2010   LDLCALC 69 01/03/2023   ALT 15 01/03/2023   AST 15 01/03/2023   NA 142 05/03/2023   K 5.1 05/03/2023   CL 101 05/03/2023   CREATININE 0.79 05/03/2023   BUN 14 05/03/2023   CO2 23 05/03/2023   TSH 1.24 01/03/2023   PSA 6.12 (H) 08/06/2006   INR 0.97 03/04/2010   HGBA1C 6.2 01/03/2023   MICROALBUR 1.1 01/03/2023     Assessment & Plan:    See Problem List for Assessment and Plan of chronic medical problems.

## 2023-07-04 ENCOUNTER — Ambulatory Visit: Payer: Medicare PPO | Admitting: Internal Medicine

## 2023-07-04 ENCOUNTER — Telehealth: Payer: Self-pay | Admitting: Cardiology

## 2023-07-04 VITALS — BP 124/72 | HR 56 | Temp 98.1°F | Ht 71.0 in | Wt 190.0 lb

## 2023-07-04 DIAGNOSIS — M1A9XX0 Chronic gout, unspecified, without tophus (tophi): Secondary | ICD-10-CM | POA: Diagnosis not present

## 2023-07-04 DIAGNOSIS — G8929 Other chronic pain: Secondary | ICD-10-CM | POA: Diagnosis not present

## 2023-07-04 DIAGNOSIS — I480 Paroxysmal atrial fibrillation: Secondary | ICD-10-CM

## 2023-07-04 DIAGNOSIS — Z7984 Long term (current) use of oral hypoglycemic drugs: Secondary | ICD-10-CM | POA: Diagnosis not present

## 2023-07-04 DIAGNOSIS — M5440 Lumbago with sciatica, unspecified side: Secondary | ICD-10-CM

## 2023-07-04 DIAGNOSIS — E1159 Type 2 diabetes mellitus with other circulatory complications: Secondary | ICD-10-CM | POA: Diagnosis not present

## 2023-07-04 DIAGNOSIS — F419 Anxiety disorder, unspecified: Secondary | ICD-10-CM | POA: Diagnosis not present

## 2023-07-04 DIAGNOSIS — I10 Essential (primary) hypertension: Secondary | ICD-10-CM | POA: Diagnosis not present

## 2023-07-04 DIAGNOSIS — E782 Mixed hyperlipidemia: Secondary | ICD-10-CM | POA: Diagnosis not present

## 2023-07-04 LAB — CBC WITH DIFFERENTIAL/PLATELET
Basophils Absolute: 0.1 10*3/uL (ref 0.0–0.1)
Basophils Relative: 1.1 % (ref 0.0–3.0)
Eosinophils Absolute: 0.2 10*3/uL (ref 0.0–0.7)
Eosinophils Relative: 3.1 % (ref 0.0–5.0)
HCT: 45.4 % (ref 39.0–52.0)
Hemoglobin: 15 g/dL (ref 13.0–17.0)
Lymphocytes Relative: 13.2 % (ref 12.0–46.0)
Lymphs Abs: 0.9 10*3/uL (ref 0.7–4.0)
MCHC: 33.1 g/dL (ref 30.0–36.0)
MCV: 92.6 fl (ref 78.0–100.0)
Monocytes Absolute: 1 10*3/uL (ref 0.1–1.0)
Monocytes Relative: 13.4 % — ABNORMAL HIGH (ref 3.0–12.0)
Neutro Abs: 4.9 10*3/uL (ref 1.4–7.7)
Neutrophils Relative %: 69.2 % (ref 43.0–77.0)
Platelets: 257 10*3/uL (ref 150.0–400.0)
RBC: 4.9 Mil/uL (ref 4.22–5.81)
RDW: 14.8 % (ref 11.5–15.5)
WBC: 7.1 10*3/uL (ref 4.0–10.5)

## 2023-07-04 LAB — COMPREHENSIVE METABOLIC PANEL WITH GFR
ALT: 16 U/L (ref 0–53)
AST: 15 U/L (ref 0–37)
Albumin: 4.9 g/dL (ref 3.5–5.2)
Alkaline Phosphatase: 49 U/L (ref 39–117)
BUN: 28 mg/dL — ABNORMAL HIGH (ref 6–23)
CO2: 28 meq/L (ref 19–32)
Calcium: 10.2 mg/dL (ref 8.4–10.5)
Chloride: 104 meq/L (ref 96–112)
Creatinine, Ser: 0.84 mg/dL (ref 0.40–1.50)
GFR: 81.66 mL/min (ref >60.00)
Glucose, Bld: 115 mg/dL — ABNORMAL HIGH (ref 70–99)
Potassium: 4.2 meq/L (ref 3.5–5.1)
Sodium: 141 meq/L (ref 135–145)
Total Bilirubin: 0.7 mg/dL (ref 0.2–1.2)
Total Protein: 8 g/dL (ref 6.0–8.3)

## 2023-07-04 LAB — LIPID PANEL
Cholesterol: 128 mg/dL (ref 0–200)
HDL: 52 mg/dL (ref >39.00)
LDL Cholesterol: 60 mg/dL (ref 0–99)
NonHDL: 76.1
Total CHOL/HDL Ratio: 2
Triglycerides: 79 mg/dL (ref 0.0–149.0)
VLDL: 15.8 mg/dL (ref 0.0–40.0)

## 2023-07-04 LAB — HEMOGLOBIN A1C: Hgb A1c MFr Bld: 6.6 % — ABNORMAL HIGH (ref 4.6–6.5)

## 2023-07-04 MED ORDER — LOSARTAN POTASSIUM 25 MG PO TABS: ORAL_TABLET | ORAL | 1 refills | Status: AC

## 2023-07-04 MED ORDER — ALLOPURINOL 300 MG PO TABS: ORAL_TABLET | ORAL | 1 refills | Status: AC

## 2023-07-04 NOTE — Assessment & Plan Note (Signed)
Chronic Regular exercise and healthy diet encouraged Check lipid panel  Continue Crestor 10 mg daily, Zetia 10 mg daily

## 2023-07-04 NOTE — Telephone Encounter (Signed)
 Patient's wife is requesting to speak with a nurse in regard to an upcoming surgery the patient will be having with Dr. Sherene Dilling. Please advise.

## 2023-07-04 NOTE — Assessment & Plan Note (Signed)
Chronic Controlled, Stable Continue Effexor XR 37.5 mg daily 

## 2023-07-04 NOTE — Telephone Encounter (Signed)
 Patient's wife (DPR) is calling about patient having surgery with Dr. Sherene Dilling. Informed her that she would need to talk to his office for surgery date and instructions. Patient's wife stated she just talked to someone, and they will be in touch soon.

## 2023-07-04 NOTE — Assessment & Plan Note (Signed)
 Chronic Blood pressure is well-controlled CBC, CMP Continue losartan 25 mg daily

## 2023-07-04 NOTE — Assessment & Plan Note (Addendum)
 Chronic Following with Dr. Larrie Po who did surgery on him previously Has disturbed his balance, strength and mobility Has intermittent chronic lower back pain and intermittent sciatica which can be severe No back pain recently

## 2023-07-04 NOTE — Assessment & Plan Note (Addendum)
 Chronic Lab Results  Component Value Date   HGBA1C 6.2 01/03/2023   Sugars controlled Check A1c today Continue glimepiride  2 mg daily, metformin  1000 mg twice daily, Jardiance  25 mg daily Not compliant with a low sugar diet-typically eats what he wants Stressed diabetic diet, exercise

## 2023-07-04 NOTE — Assessment & Plan Note (Signed)
Chronic Following with cardiology On Eliquis 5 mg twice daily Rate controlled without medication CBC, CMP

## 2023-07-04 NOTE — Assessment & Plan Note (Addendum)
 Chronic Controlled No gout attacks since starting allopurinol  Continue allopurinol  300 mg daily

## 2023-07-05 ENCOUNTER — Ambulatory Visit: Payer: Self-pay | Admitting: Internal Medicine

## 2023-07-06 ENCOUNTER — Other Ambulatory Visit: Payer: Self-pay

## 2023-07-06 DIAGNOSIS — I350 Nonrheumatic aortic (valve) stenosis: Secondary | ICD-10-CM

## 2023-07-13 ENCOUNTER — Ambulatory Visit (HOSPITAL_COMMUNITY)

## 2023-07-13 ENCOUNTER — Other Ambulatory Visit: Payer: Self-pay

## 2023-07-13 ENCOUNTER — Other Ambulatory Visit (HOSPITAL_COMMUNITY): Payer: Self-pay | Admitting: Internal Medicine

## 2023-07-13 ENCOUNTER — Ambulatory Visit (HOSPITAL_COMMUNITY)
Admission: RE | Admit: 2023-07-13 | Discharge: 2023-07-13 | Disposition: A | Discharge status: Home / Self Care | Source: Ambulatory Visit | Attending: Internal Medicine | Admitting: Internal Medicine

## 2023-07-13 ENCOUNTER — Encounter (HOSPITAL_COMMUNITY)
Admission: RE | Admit: 2023-07-13 | Discharge: 2023-07-13 | Disposition: A | Discharge status: Home / Self Care | Source: Ambulatory Visit | Attending: Internal Medicine | Admitting: Internal Medicine

## 2023-07-13 DIAGNOSIS — Z01818 Encounter for other preprocedural examination: Secondary | ICD-10-CM | POA: Diagnosis not present

## 2023-07-13 DIAGNOSIS — I350 Nonrheumatic aortic (valve) stenosis: Secondary | ICD-10-CM | POA: Diagnosis not present

## 2023-07-13 DIAGNOSIS — Z951 Presence of aortocoronary bypass graft: Secondary | ICD-10-CM | POA: Diagnosis not present

## 2023-07-13 DIAGNOSIS — I4891 Unspecified atrial fibrillation: Secondary | ICD-10-CM | POA: Insufficient documentation

## 2023-07-13 LAB — COMPREHENSIVE METABOLIC PANEL WITH GFR
ALT: 17 U/L (ref 0–44)
AST: 18 U/L (ref 15–41)
Albumin: 4.7 g/dL (ref 3.5–5.0)
Alkaline Phosphatase: 55 U/L (ref 38–126)
Anion gap: 12 (ref 5–15)
BUN: 21 mg/dL (ref 8–23)
CO2: 26 mmol/L (ref 22–32)
Calcium: 10.3 mg/dL (ref 8.9–10.3)
Chloride: 101 mmol/L (ref 98–111)
Creatinine, Ser: 0.91 mg/dL (ref 0.61–1.24)
GFR, Estimated: >60 mL/min (ref >60)
Glucose, Bld: 147 mg/dL — ABNORMAL HIGH (ref 70–99)
Potassium: 4.7 mmol/L (ref 3.5–5.1)
Sodium: 139 mmol/L (ref 135–145)
Total Bilirubin: 0.8 mg/dL (ref 0.0–1.2)
Total Protein: 8.2 g/dL — ABNORMAL HIGH (ref 6.5–8.1)

## 2023-07-13 LAB — URINALYSIS, ROUTINE W REFLEX MICROSCOPIC
Bacteria, UA: NONE SEEN
Bilirubin Urine: NEGATIVE
Glucose, UA: >=500 mg/dL — AB
Hgb urine dipstick: NEGATIVE
Ketones, ur: NEGATIVE mg/dL
Leukocytes,Ua: NEGATIVE
Nitrite: NEGATIVE
Protein, ur: NEGATIVE mg/dL
Specific Gravity, Urine: 1.022 (ref 1.005–1.030)
pH: 5 (ref 5.0–8.0)

## 2023-07-13 LAB — CBC
HCT: 49.2 % (ref 39.0–52.0)
Hemoglobin: 16.3 g/dL (ref 13.0–17.0)
MCH: 31.4 pg (ref 26.0–34.0)
MCHC: 33.1 g/dL (ref 30.0–36.0)
MCV: 94.8 fL (ref 80.0–100.0)
Platelets: 287 10*3/uL (ref 150–400)
RBC: 5.19 MIL/uL (ref 4.22–5.81)
RDW: 13.9 % (ref 11.5–15.5)
WBC: 7.4 10*3/uL (ref 4.0–10.5)
nRBC: 0 % (ref 0.0–0.2)

## 2023-07-13 LAB — SURGICAL PCR SCREEN
MRSA, PCR: NEGATIVE
Staphylococcus aureus: NEGATIVE

## 2023-07-13 LAB — PROTIME-INR
INR: 1.2 (ref 0.8–1.2)
Prothrombin Time: 15.6 s — ABNORMAL HIGH (ref 11.4–15.2)

## 2023-07-13 LAB — TYPE AND SCREEN
ABO/RH(D): A NEG
Antibody Screen: NEGATIVE

## 2023-07-13 NOTE — Progress Notes (Signed)
 Patient signed all consents at PAT lab appointment. CHG soap and instructions were given to patient. CHG surgical prep reviewed with patient and all questions answered.  Patients chart send to anesthesia for review. Pt denies any respiratory illness/infection in the last two months.

## 2023-07-16 MED ORDER — DEXMEDETOMIDINE HCL IN NACL 400 MCG/100ML IV SOLN
0.1000 ug/kg/h | INTRAVENOUS | Status: AC
  Administered 2023-07-17: 43.12 ug via INTRAVENOUS
  Administered 2023-07-17: 1 ug/kg/h via INTRAVENOUS
  Filled 2023-07-16: qty 100

## 2023-07-16 MED ORDER — HEPARIN 30,000 UNITS/1000 ML (OHS) CELLSAVER SOLUTION
Status: DC
  Filled 2023-07-16 (×2): qty 1000

## 2023-07-16 MED ORDER — CEFAZOLIN SODIUM-DEXTROSE 2-4 GM/100ML-% IV SOLN
2.0000 g | INTRAVENOUS | Status: AC
  Administered 2023-07-17: 2 g via INTRAVENOUS
  Filled 2023-07-16 (×2): qty 100

## 2023-07-16 MED ORDER — POTASSIUM CHLORIDE 2 MEQ/ML IV SOLN
80.0000 meq | INTRAVENOUS | Status: DC
  Filled 2023-07-16 (×2): qty 40

## 2023-07-16 MED ORDER — NOREPINEPHRINE 4 MG/250ML-% IV SOLN
0.0000 ug/min | INTRAVENOUS | Status: AC
  Administered 2023-07-17: 3 ug/min via INTRAVENOUS
  Filled 2023-07-16: qty 250

## 2023-07-16 MED ORDER — MAGNESIUM SULFATE 50 % IJ SOLN
40.0000 meq | INTRAMUSCULAR | Status: DC
  Filled 2023-07-16 (×2): qty 9.85

## 2023-07-16 NOTE — H&P (Signed)
 [address], [place] [address]             [phone]     CARDIOTHORACIC SURGERY ADMISSION HISTORY AND PHYSICAL   PCP is Burns, Beckey Bourgeois, MD Referring Provider is Alyssa Backbone, MD Primary Cardiologist is Peter Swaziland, MD   Reason for admission:  Severe aortic stenosis   HPI:   The patient is an 83 year old gentleman with history of type 2 diabetes, hypertension, hyperlipidemia, paroxysmal atrial fibrillation status post ablation on Eliquis , stage II chronic kidney disease, coronary artery disease status post CABG x 4 in 1995, moderate mitral stenosis and severe aortic stenosis who was referred for consideration of TAVR.  Over the past few months he has had progressive exertional fatigue and shortness of breath with walking and going up stairs.  His most recent echo on 04/12/2023 showed a calcified and thickened aortic valve with restricted leaflet mobility.  The mean gradient was 35 mmHg with a peak gradient of 55 mmHg and a valve area by VTI of 0.53 cm.  There was also moderate to severe aortic insufficiency.  There was moderate mitral stenosis with mean gradient of 11 mmHg at peak of 29 mmHg.  Left ventricular ejection fraction was 65 to 70%.       Past Medical History:  Diagnosis Date   Anemia     Atrial fibrillation (HCC)      pt has had an ablation   BPH (benign prostatic hyperplasia)      Elevated PSA   Childhood asthma     Coronary artery disease      post CABG x4 -- in 1995 -- due to three-vessel coronary artery disease   Diverticular disease     Dyslipidemia     First degree AV block     Gout     Hypertension     Osteoarthritis      everywhere (12/04/2014)   Prostate cancer (HCC) 01/30/2010    Dr Grady Lawman   Spinal stenosis 01/30/2002    L4 radiculopathy, herniated nucleus pulposus L4-5   Tinnitus     Type II diabetes mellitus (HCC)     Typical atrial flutter (HCC)      s/p ablation               Past Surgical History:  Procedure  Laterality Date   APPENDECTOMY   1943   ATRIAL FLUTTER ABLATION   12/04/2014   BACK SURGERY       CARDIAC CATHETERIZATION   02/10/2010    Est. EF of 60-65% -- Severe three-vessel obstructive atherosclerotic coronary artery disease -- All grafts were patent including left internal mammary artery graft to left anterior descending coronary artery, saphenous vein graft to the diagonal, saphenous vein graft to obtuse marginal vessel, and saphenous vein graft to the posterior descending coronary artery -- Normal left ventricular function    CATARACT EXTRACTION Bilateral 2023   COLONOSCOPY        negative X3; Dr Willy Harvest   CORONARY ARTERY BYPASS GRAFT   10/21/1993    x4 -- using the LIM artery graft to the LAD artery, with saphenous vein grafts to the diagonal branch of the LAD, OM branch the left circumflex coronary artery, and the posterior descending branch of the RCA -- Est. EF of 65%-- Surgeon: Bartley Lightning, M.D.               ELECTROPHYSIOLOGIC STUDY N/A 10/01/2014    CTI ablation by  Dr Lawana Pray   ELECTROPHYSIOLOGIC STUDY N/A 12/04/2014    repeat CTI ablation by Dr Nunzio Belch   FLEXIBLE SIGMOIDOSCOPY   01/31/2003     external hemorrhoids   JOINT REPLACEMENT       LUMBAR LAMINECTOMY/DECOMPRESSION MICRODISCECTOMY Bilateral 08/03/2021    Procedure: Lumbar two-three, Lumbar three-four, Lumbar four-five BILATERAL LAMINOTOMY/FORAMINOTOMY;  Surgeon: Garry Kansas, MD;  Location: [facility] OR;  Service: Neurosurgery;  Laterality: Bilateral;   MICRODISCECTOMY LUMBAR   01/30/2002    L4-5, Dr. Larrie Po   PROSTATE BIOPSY   10/30/2008    had radiation tx for prostate cancer   RIGHT HEART CATH AND CORONARY/GRAFT ANGIOGRAPHY N/A 04/24/2023    Procedure: RIGHT HEART CATH AND CORONARY/GRAFT ANGIOGRAPHY;  Surgeon: Swaziland, Peter M, MD;  Location: [facility] INVASIVE CV LAB;  Service: Cardiovascular;  Laterality: N/A;   SALIVARY STONE REMOVAL   01/31/1999   TOTAL HIP ARTHROPLASTY Right 11/30/2008   TOTAL HIP ARTHROPLASTY Left  03/02/2010    Dr Bernard Brick               Family History  Problem Relation Age of Onset   Diabetes Father     Hypertension Father     Lung cancer Father          smoker   Heart failure Mother 20   Hyperlipidemia Sister     Coronary artery disease Sister     Heart attack Paternal Aunt          X 2; both > 65   Colon cancer Neg Hx     Stroke Neg Hx            Social History         Socioeconomic History   Marital status: Married      Spouse name: Shelvy Dickens   Number of children: 2   Years of education: 16   Highest education level: Not on file  Occupational History   Occupation: retired  Tobacco Use   Smoking status: Former      Current packs/day: 0.00      Average packs/day: 2.5 packs/day for 15.0 years (37.5 ttl pk-yrs)      Types: Cigarettes      Start date: 01/31/1955      Quit date: 01/30/1970      Years since quitting: 53.4   Smokeless tobacco: Never   Tobacco comments:      smoked 1959-1972  Vaping Use   Vaping status: Never Used  Substance and Sexual Activity   Alcohol  use: Not Currently      Alcohol /week: 2.0 standard drinks of alcohol       Types: 1 Cans of beer, 1 Standard drinks or equivalent per week      Comment: maybe a beer or 1 liquor drink once every 2 weeks   Drug use: No   Sexual activity: Not Currently  Other Topics Concern   Not on file  Social History Narrative    Pt lives in [place] with spouse. 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  Enjoys reading and walking.    Left handed    Town house    Social Drivers of Health        Financial Resource Strain: Low Risk  (11/14/2022)    Overall Financial Resource Strain (CARDIA)     Difficulty of Paying Living Expenses: Not hard at all  Food Insecurity: No Food Insecurity (11/14/2022)    Hunger Vital Sign     Worried About Running Out of Food in the Last Year: Never true  Ran Out of Food in the Last Year: Never true  Transportation Needs: No Transportation Needs (11/14/2022)    PRAPARE -  Therapist, art (Medical): No     Lack of Transportation (Non-Medical): No  Physical Activity: Sufficiently Active (11/14/2022)    Exercise Vital Sign     Days of Exercise per Week: 7 days     Minutes of Exercise per Session: 30 min  Stress: No Stress Concern Present (11/14/2022)    [employer] of Occupational Health - Occupational Stress Questionnaire     Feeling of Stress : Not at all  Social Connections: Moderately Isolated (11/14/2022)    Social Connection and Isolation Panel [NHANES]     Frequency of Communication with Friends and Family: More than three times a week     Frequency of Social Gatherings with Friends and Family: Twice a week     Attends Religious Services: Never     Database administrator or Organizations: No     Attends Banker Meetings: Never     Marital Status: Married  Catering manager Violence: Not At Risk (11/14/2022)    Humiliation, Afraid, Rape, and Kick questionnaire     Fear of Current or Ex-Partner: No     Emotionally Abused: No     Physically Abused: No     Sexually Abused: No             Prior to Admission medications   Medication Sig Start Date End Date Taking? Authorizing Provider  allopurinol  (ZYLOPRIM ) 300 MG tablet TAKE 1 TABLET(300 MG) BY MOUTH DAILY 05/17/23   Yes Burns, Beckey Bourgeois, MD  ELIQUIS  5 MG TABS tablet TAKE 1 TABLET(5 MG) BY MOUTH TWICE DAILY 01/26/23   Yes [place], Peter M, MD  ezetimibe  (ZETIA ) 10 MG tablet TAKE 1 TABLET(10 MG) BY MOUTH AT BEDTIME 05/08/23   Yes Thukkani, Arun K, MD  glimepiride  (AMARYL ) 2 MG tablet TAKE 1 TABLET(2 MG) BY MOUTH DAILY BEFORE BREAKFAST 06/11/23   Yes Burns, Beckey Bourgeois, MD  JARDIANCE  25 MG TABS tablet TAKE 1 TABLET(25 MG) BY MOUTH DAILY 02/16/23   Yes Burns, Beckey Bourgeois, MD  losartan  (COZAAR ) 25 MG tablet TAKE 1 TABLET(25 MG) BY MOUTH DAILY 05/17/23   Yes Burns, Beckey Bourgeois, MD  Melatonin 10 MG TABS Take 10 mg by mouth at bedtime.     Yes [provider]  metFORMIN   (GLUCOPHAGE ) 500 MG tablet TAKE 2 TABLETS(1000 MG) BY MOUTH TWICE DAILY WITH A MEAL 05/25/23   Yes Colene Dauphin, MD  metoprolol  tartrate (LOPRESSOR ) 50 MG tablet Take one tablet by mouth as directed prior to 5/8 CT scan 05/21/23   Yes Thukkani, Arun K, MD  Multiple Vitamins-Minerals (CENTRUM PO) Take 1 tablet by mouth daily. Silver     Yes [provider]  OVER THE COUNTER MEDICATION Take 3 capsules by mouth daily. Balance of [employer] and Veggies     Yes [provider]  rosuvastatin  (CRESTOR ) 10 MG tablet TAKE 1 TABLET(10 MG) BY MOUTH AT BEDTIME 05/17/23   Yes [place], Peter M, MD  sodium chloride  (OCEAN) 0.65 % SOLN nasal spray Place 1 spray into both nostrils at bedtime.     Yes [provider]  venlafaxine  XR (EFFEXOR -XR) 37.5 MG 24 hr capsule TAKE 1 CAPSULE(37.5 MG) BY MOUTH DAILY WITH BREAKFAST 11/07/22   Yes Burns, Beckey Bourgeois, MD            Current Outpatient Medications  Medication Sig Dispense Refill   allopurinol  (ZYLOPRIM ) 300 MG tablet TAKE 1 TABLET(300 MG) BY MOUTH DAILY 90 tablet 1   ELIQUIS  5 MG TABS tablet TAKE 1 TABLET(5 MG) BY MOUTH TWICE DAILY 180 tablet 3   ezetimibe  (ZETIA ) 10 MG tablet TAKE 1 TABLET(10 MG) BY MOUTH AT BEDTIME 90 tablet 3   glimepiride  (AMARYL ) 2 MG tablet TAKE 1 TABLET(2 MG) BY MOUTH DAILY BEFORE BREAKFAST 90 tablet 1   JARDIANCE  25 MG TABS tablet TAKE 1 TABLET(25 MG) BY MOUTH DAILY 90 tablet 2   losartan  (COZAAR ) 25 MG tablet TAKE 1 TABLET(25 MG) BY MOUTH DAILY 90 tablet 1   Melatonin 10 MG TABS Take 10 mg by mouth at bedtime.       metFORMIN  (GLUCOPHAGE ) 500 MG tablet TAKE 2 TABLETS(1000 MG) BY MOUTH TWICE DAILY WITH A MEAL 360 tablet 1   metoprolol  tartrate (LOPRESSOR ) 50 MG tablet Take one tablet by mouth as directed prior to 5/8 CT scan 1 tablet 0   Multiple Vitamins-Minerals (CENTRUM PO) Take 1 tablet by mouth daily. Silver       OVER THE COUNTER MEDICATION Take 3 capsules by mouth daily. Balance of [employer]ruits and Veggies        rosuvastatin  (CRESTOR ) 10 MG tablet TAKE 1 TABLET(10 MG) BY MOUTH AT BEDTIME 90 tablet 3   sodium chloride  (OCEAN) 0.65 % SOLN nasal spray Place 1 spray into both nostrils at bedtime.       venlafaxine  XR (EFFEXOR -XR) 37.5 MG 24 hr capsule TAKE 1 CAPSULE(37.5 MG) BY MOUTH DAILY WITH BREAKFAST 90 capsule 3      No current facility-administered medications for this visit.        Allergies      Allergies  Allergen Reactions   Naproxen Sodium Rash            Review of Systems:               General:                      normal appetite, + decreased energy, + weight gain, no weight loss, no fever             Cardiac:                       no chest pain with exertion, no chest pain at rest, + SOB with moderate exertion, mp resting SOB, mp PND, mp orthopnea, mp palpitations, + arrhythmia, + atrial fibrillation, no LE edema, no dizzy spells, no syncope             Respiratory:                 + exertional shortness of breath, no home oxygen, no productive cough, no dry cough, no bronchitis, no wheezing, no hemoptysis, no asthma, no pain with inspiration or cough, no sleep apnea, no CPAP at night             GI:                               no difficulty swallowing, no reflux, no frequent heartburn, no hiatal hernia, no abdominal pain, no constipation, no diarrhea, no hematochezia, no hematemesis, no melena             GU:  no dysuria,  no frequency, no urinary tract infection, no hematuria, no enlarged prostate, no kidney stones, + chronic kidney disease             Vascular:                     no pain suggestive of claudication, no pain in feet, no leg cramps, no varicose veins, no DVT, no non-healing foot ulcer             Neuro:                         no stroke, no TIA's, no seizures, no headaches, no temporary blindness one eye,  no slurred speech, + peripheral neuropathy, no chronic pain, no instability of gait, no memory/cognitive dysfunction              Musculoskeletal:         + arthritis, no joint swelling, no myalgias, no difficulty walking, normal mobility              Skin:                            no rash, no itching, no skin infections, no pressure sores or ulcerations             Psych:                         no anxiety, no depression, no nervousness, no unusual recent stress             Eyes:                           no blurry vision, no floaters, no recent vision changes, + wears glasses              ENT:                            no hearing loss, no loose or painful teeth, no dentures, last saw dentist Dec 2024             Hematologic:               no easy bruising, no abnormal bleeding, no clotting disorder, no frequent epistaxis             Endocrine:                   + diabetes, does not check CBG's at home                            Physical Exam:               BP (!) 148/69   Pulse 91   Resp 18   Ht 5' 11 (1.803 m)   Wt 195 lb (88.5 kg)   SpO2 95% Comment: RA  BMI 27.20 kg/m              General:                      well-appearing             HEENT:  Unremarkable, NCAT, PERLA, EOMI             Neck:                           no JVD, no bruits, no adenopathy              Chest:                          clear to auscultation, symmetrical breath sounds, no wheezes, no rhonchi             CV:                              RRR, 3/6 systolic murmur RSB, no diastolic murmur             Abdomen:                    soft, non-tender, no masses              Extremities:                 warm, well-perfused, pedal pulses palpable, no lower extremity edema             Rectal/GU                   Deferred             Neuro:                         Grossly non-focal and symmetrical throughout             Skin:                            Clean and dry, no rashes, no breakdown   Diagnostic Tests:   ECHOCARDIOGRAM REPORT       Patient Name:   Josiephine Nightingale Date of Exam: 04/12/2023  Medical Rec #:   [id number]        Height:       72.0 in  Accession #:    [id number]       Weight:       191.0 lb  Date of Birth:  [birth year]         BSA:          2.089 m  Patient Age:    82 years         BP:           110/62 mmHg  Patient Gender: M                HR:           74 bpm.  Exam Location:  Outpatient   Procedure: 2D Echo, 3D Echo, Cardiac Doppler and Color Doppler (Both  Spectral            and Color Flow Doppler were utilized during procedure).   Indications:    R06.9 DOE; I77.819 Thoracic aorta ectasia    History:        Patient has prior history of Echocardiogram examinations,  most                 recent 12/31/2018. CAD, Prior CABG and Ablation,  Arrythmias:Atrial Flutter and First degree AV Block,                  Signs/Symptoms:Dyspnea; Risk Factors:Hypertension,  Diabetes,                 Dyslipidemia and Former Smoker. Patient denies chest pain,  leg                 edema, and rheumatic fever as a child. He does have some  DOE.    Sonographer:   Richarda Chance RVT, RDCS (AE), RDMS  Referring Phys: 17 PETER M Sw[place]iland   IMPRESSIONS     1. Left ventricular ejection fraction, by estimation, is 65 to 70%. The  left ventricle has normal function. The left ventricle has no regional  wall motion abnormalities. There is moderate left ventricular hypertrophy.  Left ventricular diastolic function   could not be evaluated.   2. Right ventricular systolic function is normal. The right ventricular  size is normal. There is mildly elevated pulmonary artery systolic  pressure. The estimated right ventricular systolic pressure is 38.5 mmHg.   3. Left atrial size was mildly dilated.   4. The mitral valve is degenerative and heavily calcified. Mild mitral  valve regurgitation. Moderate mitral stenosis. The mean mitral valve  gradient is 11.0 mmHg with average heart rate of 73 bpm.   5. The aortic valve is calcified. The left and right coronary cusps  appear fixed. There is  moderate calcification of the aortic valve. Aortic  valve regurgitation is moderate to severe. Severe aortic valve stenosis.  Aortic valve area, by VTI measures  0.53 cm. Aortic valve mean gradient measures 35.0 mmHg. Aortic valve Vmax  measures 3.70 m/s. Peak gradient 54.8 mmHg, DI 0.23.   6. Aortic dilatation noted. There is borderline dilatation of the aortic  root, measuring 38 mm.   7. The inferior vena cava is dilated in size with >50% respiratory  variability, suggesting right atrial pressure of 8 mmHg.   Comparison(s): 12/31/2018: LVEF 70%, mild AI, moderate-severe MAC, asc aor  38mm, AOV mean 11.4, peak 23.6 mmHg.   Conclusion(s)/Recommendation(s): Recommend further evaluation of AS/AR and  MS with TEE and/or exercise testing to determine if the patient is  symptomatic.   FINDINGS   Left Ventricle: Left ventricular ejection fraction, by estimation, is 65  to 70%. The left ventricle has normal function. The left ventricle has no  regional wall motion abnormalities. 3D ejection fraction reviewed and  evaluated as part of the  interpretation. Alternate measurement of EF is felt to be most reflective  of LV function. The left ventricular internal cavity size was normal in  size. There is moderate left ventricular hypertrophy. Left ventricular  diastolic function could not be  evaluated due to atrial fibrillation. Left ventricular diastolic function  could not be evaluated.   Right Ventricle: The right ventricular size is normal. No increase in  right ventricular wall thickness. Right ventricular systolic function is  normal. There is mildly elevated pulmonary artery systolic pressure. The  tricuspid regurgitant velocity is 2.76   m/s, and with an assumed right atrial pressure of 8 mmHg, the estimated  right ventricular systolic pressure is 38.5 mmHg.   Left Atrium: Left atrial size was mildly dilated.   Right Atrium: Right atrial size was normal in size.   Pericardium:  There is no evidence of pericardial effusion.   Mitral Valve: The mitral valve is degenerative in appearance. There is  severe calcification of the  anterior and posterior mitral valve  leaflet(s). Mild mitral valve regurgitation. Moderate mitral valve  stenosis. MV peak gradient, 28.8 mmHg. The mean  mitral valve gradient is 11.0 mmHg with average heart rate of 73 bpm.   Tricuspid Valve: The tricuspid valve is grossly normal. Tricuspid valve  regurgitation is trivial.   Aortic Valve: The left and right coronary cusps appear fixed. The aortic  valve is calcified. There is moderate calcification of the aortic valve.  Aortic valve regurgitation is moderate to severe. Severe aortic stenosis  is present. Aortic valve mean  gradient measures 35.0 mmHg. Aortic valve peak gradient measures 54.8  mmHg. Aortic valve area, by VTI measures 0.53 cm.   Pulmonic Valve: The pulmonic valve was grossly normal. Pulmonic valve  regurgitation is trivial.   Aorta: Aortic dilatation noted. There is borderline dilatation of the  aortic root, measuring 38 mm.   Venous: The inferior vena cava is dilated in size with greater than 50%  respiratory variability, suggesting right atrial pressure of 8 mmHg.   IAS/Shunts: No atrial level shunt detected by color flow Doppler.   Additional Comments: 3D was performed not requiring image post processing  on an independent workstation and was indeterminate.     LEFT VENTRICLE  PLAX 2D  LVIDd:         4.63 cm   Diastology  LVIDs:         2.05 cm   LV e' medial:    5.18 cm/s  LV PW:         1.32 cm   LV E/e' medial:  41.3  LV IVS:        1.46 cm   LV e' lateral:   6.64 cm/s  LVOT diam:     1.72 cm   LV E/e' lateral: 32.2  LV SV:         46  LV SV Index:   22  LVOT Area:     2.32 cm                             3D Volume EF:                           3D EF:        57 %                           LV EDV:       117 ml                           LV ESV:       50  ml                           LV SV:        67 ml   RIGHT VENTRICLE  RV S prime:     9.68 cm/s  TAPSE (M-mode): 1.5 cm   LEFT ATRIUM              Index        RIGHT ATRIUM           Index  LA diam:        4.81 cm  2.30 cm/m   RA Area:     20.90 cm  LA  Vol [facility]):   105.0 ml 50.26 ml/m  RA Volume:   67.80 ml  32.45 ml/m  LA Vol (A4C):   58.1 ml  27.81 ml/m  LA Biplane Vol: 78.1 ml  37.38 ml/m   AORTIC VALVE                     PULMONIC VALVE  AV Area (Vmax):    0.63 cm      PV Vmax:       0.92 m/s  AV Area (Vmean):   0.57 cm      PV Peak grad:  3.4 mmHg  AV Area (VTI):     0.53 cm  AV Vmax:           370.00 cm/s  AV Vmean:          279.000 cm/s  AV VTI:            0.868 m  AV Peak Grad:      54.8 mmHg  AV Mean Grad:      35.0 mmHg  LVOT Vmax:         99.70 cm/s  LVOT Vmean:        68.000 cm/s  LVOT VTI:          0.199 m  LVOT/AV VTI ratio: 0.23  AR Vena Contracta: 0.46 cm    AORTA  Ao Root diam: 3.58 cm  Ao Asc diam:  3.59 cm  Ao Arch diam: 3.4 cm   MITRAL VALVE                TRICUSPID VALVE  MV Area (PHT): 1.39 cm     TR Peak grad:   30.5 mmHg  MV Area VTI:   0.72 cm     TR Vmax:        276.00 cm/s  MV Peak grad:  28.8 mmHg  MV Mean grad:  11.0 mmHg    SHUNTS  MV Vmax:       2.68 m/s     Systemic VTI:  0.20 m  MV Vmean:      151.5 cm/s   Systemic Diam: 1.72 cm  MV Decel Time: 544 msec  MR Peak grad: 128.1 mmHg  MR Vmax:      566.00 cm/s  MV E velocity: 214.00 cm/s   Dinah Franco MD  Electronically signed by Dinah Franco MD  Signature Date/Time: 04/12/2023/2:48:08 PM        Final        hysicians   Panel Physicians Referring Physician Case Authorizing Physician  [place], Peter M, MD (Primary)        Procedures   RIGHT HEART CATH AND CORONARY/GRAFT ANGIOGRAPHY    Conclusion       Mid LM to Dist LM lesion is 70% stenosed.   Prox LAD lesion is 100% stenosed.   Ost Cx to Prox Cx lesion is 95% stenosed.   Ost RCA lesion is 100% stenosed.   RPDA  lesion is 100% stenosed.   Prox Graft to Mid Graft lesion is 30% stenosed.   LIMA graft was visualized by angiography and is normal in caliber.   SVG graft was visualized by angiography and is normal in caliber.   SVG graft was visualized by angiography and is normal in caliber.   SVG graft was visualized by angiography and is normal in caliber.   The graft exhibits no disease.   The graft exhibits no disease.   The graft exhibits  no disease.   Hemodynamic findings consistent with moderate pulmonary hypertension.   3 vessel occlusive CAD Patent LIMA to the LAD Patent SVG to first diagonal Patent SVG to OM Patent SVG to PDA. Left to right collaterals to the PL and mid RCA Moderate pulmonary HTN. PAP 68/28, mean 42 mm Hg Elevated PCWP 30/45, mean 35 mm Hg Cardiac output 4.22 L/min with index 2.04 Difficult crossing AV with catheter due to significant radial artery spasm.    Plan: plan heart valve team assessment     Indications   Nonrheumatic aortic valve stenosis [I35.0 (ICD-10-CM)]    Procedural Details   Technical Details Indication: 83 yo WM s/p remote CABG presents with progressive severe aortic stenosis.  Procedural Details: The left wrist was prepped, draped, and anesthetized with 1% lidocaine . Ultrasound was used to guide access. Image was obtained and stored in the record.  Using the modified Seldinger technique a 6 Fr slender sheath was placed in the left radial artery and a 7 French sheath was placed in the right brachial vein. A Swan-Ganz catheter was used for the right heart catheterization. Standard protocol was followed for recording of right heart pressures and sampling of oxygen saturations. Fick cardiac output was calculated. Standard Judkins catheters were used for selective coronary and graft angiography. While I was able to cross the AV with a wire I was unable to pass a catheter across mainly due to  spasm in the forearm- despite extra sedation, verapamil , and a  MP catheter. There were no immediate procedural complications. The patient was transferred to the post catheterization recovery area for further monitoring. Contrast: 90 cc   Estimated blood loss <50 mL.   During this procedure medications were administered to achieve and maintain moderate conscious sedation while the patient's heart rate, blood pressure, and oxygen saturation were continuously monitored and I was present face-to-face 100% of this time. Logan Boothe Rad Tech and [facility] Cardiovascular Specialist are independent, trained observers who assisted in the monitoring of the patient's level of consciousness.    Medications (Filter: Administrations occurring from 1138 to 1304 on 04/24/23) Heparin  (Porcine) in NaCl 1000-0.9 UT/500ML-% SOLN (mL)  Total volume: 1,500 mL Date/Time Rate/Dose/Volume Action    04/24/23 1148 500 mL Given    1148 500 mL Given    1158 500 mL Given    midazolam  (VERSED ) injection (mg)  Total dose: 2 mg Date/Time Rate/Dose/Volume Action    04/24/23 1207 1 mg Given    1251 1 mg Given    fentaNYL  (SUBLIMAZE ) injection (mcg)  Total dose: 50 mcg Date/Time Rate/Dose/Volume Action    04/24/23 1207 25 mcg Given    1251 25 mcg Given    lidocaine  (PF) (XYLOCAINE ) 1 % injection (mL)  Total volume: 3 mL Date/Time Rate/Dose/Volume Action    04/24/23 1208 1 mL Given    1215 2 mL Given    Radial Cocktail/Verapamil  only (mL)  Total volume: 20 mL Date/Time Rate/Dose/Volume Action    04/24/23 1217 10 mL Given    1250 10 mL Given    heparin  sodium (porcine) injection (Units)  Total dose: 4,500 Units Date/Time Rate/Dose/Volume Action    04/24/23 1226 4,500 Units Given    iohexol  (OMNIPAQUE ) 350 MG/ML injection (mL)  Total volume: 90 mL Date/Time Rate/Dose/Volume Action    04/24/23 1303 90 mL Given      Sedation Time   Sedation Time Physician-1: 45 minutes 25 seconds Radiation/Fluoro   Fluoro time: 17 (min) DAP: 25.3 (Gycm2) Cumulative Air Kerma:  404.4 (mGy) Complications   Complications documented before study signed (04/24/2023  1:08 PM)    No complications were associated with this study.  Documented by Maudie Sorrow, RT - 04/24/2023 12:55 PM      Coronary Findings   Diagnostic Dominance: Right Left Main  Mid LM to Dist LM lesion is 70% stenosed.    Left Anterior Descending  Prox LAD lesion is 100% stenosed.    Left Circumflex  Ost Cx to Prox Cx lesion is 95% stenosed.    Right Coronary Artery  Ost RCA lesion is 100% stenosed.    Right Ventricular Branch  Collaterals  RV Branch filled by collaterals from Dist Cx.       Right Posterior Descending Artery  RPDA lesion is 100% stenosed.    Right Posterior Atrioventricular Artery  Collaterals  RPAV filled by collaterals from Dist Cx.       LIMA LIMA Graft To Mid LAD  LIMA graft was visualized by angiography and is normal in caliber. The graft exhibits no disease.    Saphenous Graft To 3rd Mrg  SVG graft was visualized by angiography and is normal in caliber. The graft exhibits no disease.    Saphenous Graft To 1st Diag  SVG graft was visualized by angiography and is normal in caliber. The graft exhibits no disease.    Saphenous Graft To RPDA  SVG graft was visualized by angiography and is normal in caliber.  Prox Graft to Mid Graft lesion is 30% stenosed.    Intervention    No interventions have been documented.    Right Heart   Right Heart Pressures Hemodynamic findings consistent with moderate pulmonary hypertension. Elevated LV EDP consistent with volume overload.    Coronary Diagrams   Diagnostic Dominance: Right  Intervention    Implants    No implant documentation for this case.    Syngo Images    Show images for CARDIAC CATHETERIZATION Images on Long Term Storage    Show images for [place], Witts Link to Procedure Log   Procedure Log    Hemodynamics   Pressures Phases Resting  Right      RA Mean  mmHg  15    RA A-Wave  mmHg 19    RA V-Wave  mmHg 17  Pulmonary      PA  mmHg 68/28 (42)    PCW Mean  mmHg 35.0    PCW A-Wave  mmHg 30.0    PCW V-Wave  mmHg 45.0    PAPi   2.7    Saturations Phases Resting    PA  % 61    Arterial  % 92    Hemo Data   Flowsheet Row Most Recent Value  Fick Cardiac Output 4.22 L/min  Fick Cardiac Output Index 2.04 (L/min)/BSA  RA A Wave 19 mmHg  RA V Wave 17 mmHg  RA Mean 15 mmHg  RV Systolic Pressure 69 mmHg  RV Diastolic Pressure 6 mmHg  RV EDP 15 mmHg  PA Systolic Pressure 68 mmHg  PA Diastolic Pressure 28 mmHg  PA Mean 42 mmHg  PW A Wave 30 mmHg  PW V Wave 45 mmHg  PW Mean 35 mmHg  AO Systolic Pressure 136 mmHg  AO Diastolic Pressure 51 mmHg  AO Mean 84 mmHg  QP/QS 1  TPVR Index 20.64 HRUI  TSVR Index 41.27 HRUI  PVR SVR Ratio 0.1  TPVR/TSVR Ratio 0.5    ADDENDUM REPORT: 06/16/2023 17:36   EXAM: OVER-READ  INTERPRETATION  CT CHEST   The following report is an over-read performed by radiologist Dr. Cyndia Drape [facility] on 06/16/2023. This over-read does not include interpretation of cardiac or coronary anatomy or pathology. The coronary CTA interpretation by the cardiologist is attached.   COMPARISON:  None.   FINDINGS: Cardiovascular: See findings discussed in the body of the report. Atheromatous calcifications.   Mediastinum/Nodes: No suspicious adenopathy identified. Imaged mediastinal structures are unremarkable.   Lungs/Pleura: There is dependent basilar subsegmental atelectasis. No pneumonia or pulmonary edema. No pleural effusion or pneumothorax.   Upper Abdomen: No acute abnormality.   Musculoskeletal: No chest wall abnormality. No acute osseous findings.   IMPRESSION: No acute extracardiac incidental findings.     Electronically Signed   By: Sydell Eva M.D.   On: 06/16/2023 17:36    Addended by Osborn Blaze, MD on 06/16/2023  5:38 PM    Study Result   Narrative &  Impression  MEDICATIONS: MEDICATIONS None   EXAM: Cardiac TAVR CT   TECHNIQUE: The patient was scanned on a GE apex scanner. 1 beat acquisition triggered in the descending thoracic aorta at 110 HU's. A non contrast, gated CT scan was obtained first with axial slices of 2.5 mm through the heart for valve scoring. A 120 kV retrospective, gated, contrast scan done with gantry rotation speed of 230 msec and collimation 0.63 mm. A delayed scan was obtained to exclude LAA thrombus. The 3D data set was reconstructed in 5% intervals of the R-R cycle. Best systolic phase was motion corrected Images were analyzed on a dedicated workstation using MPR, MIP and VRT modes The patient received 100 cc of contrast   FINDINGS: Aortic Valve: Tri leaflet markedly calcified with score 5694   Aorta: Normal arch vessels no aneurysm moderate calcific atherosclerosis   Sinotubular Junction: 30 mm   Ascending Thoracic Aorta: 34 mm   Aortic Arch: 29 mm   Descending Thoracic Aorta: 22 mm   Sinus of Valsalva Measurements:   Non-coronary: 35.8 mm  Height 25.7 mm   Right - coronary: 35.6 mm  Height 24.4 mm   Left - coronary: 36.4 mm  Height 20.6 mm   Coronary Artery Height above Annulus:   Left Main: 15.3 mm above annulus   Right Coronary: 22.4 mm above annulus   Virtual Basal Annulus Measurements:   Maximum/Minimum Diameter: 28.1 mm x 20.9 mm   Perimeter: 79.7 mm   Area: 475 mm2   Coronary Arteries: Suitable height above annulus for deployment   Patent SVG to PDA, OM and Diagonal   Petent LIMA to LAD   Optimum Fluoroscopic Angle for Delivery: RAO 6 Caudal 4 degrees   Membranous septal length 8.7 mm but calcified   IMPRESSION: 1.  Tri leaflet AV with calcium  score 5694   2. Annular area of 475 mm2 suitable for a 26 mm Sapien valve Alternatively a 29 mm Medtronic valve   3. Nodular calcific protrusion to the annulus at the non coronary cusp measuring 6.5 mm extending 1.5 cm  into the LVOT along the intervalvular fibrosa. Increased risk of significant peri valvular leak post deployment   4. Coronary arteries sufficient height above annulus for deployment Patent SVG;s to PDA, Diagonal, OM and patent LIMA to LAD   5.  Membranous septal length 8.7 mm but calcified   6.  Severe posterior MAC   7.  Optimum angiographic angle for deployment RAO 6 Caudal 4 degrees   Will need review by structural team  Janelle Mediate   Electronically Signed: By: Janelle Mediate M.D. On: 06/07/2023 11:03        EXAMINATION: CT ANGIO ABDOMEN PELVIS W & WO CONTRAST (accession [id number] [facility]), CT ANGIO CHEST AORTA W & OR WO CONTRAST (accession [id number] [facility])   CLINICAL INDICATION: Male, 83 years old. Aortic valve replacement (TAVR), pre-op eval   TECHNIQUE: Axial CTA of the chest, abdomen and pelvis with and without 100 cc Omnipaque  300 intravenous contrast. Multiplanar and 3D reformations provided. Unless otherwise specified, incidental thyroid , adrenal, renal lesions do not require dedicated imaging follow up. Additionally, any mentioned pulmonary nodules do not require dedicated imaging follow-up based on the Fleischner guidelines unless otherwise specified. Coronary calcifications are not identified unless otherwise specified.   COMPARISON:   FINDINGS:   The thoracic aorta is nonaneurysmal. Scattered atherosclerotic changes are present. There is a usual 3 branch aortic arch. The origins of the great vessels are patent. The visualized thyroid  is normal. The main pulmonary artery is mildly dilated. Left atrium is enlarged. There are coronary calcifications with post-CABG changes. There are calcifications of the aortic valve. There is no free fluid or pathologic lymphadenopathy by size criteria. The trachea and mainstem bronchi appear patent. Subsegmental atelectatic changes are noted within the lung bases. The lungs are otherwise clear.   The abdominal aorta is  normal in caliber. Scattered atherosclerotic changes are present. The celiac artery and SMA appear patent. The renal arteries and IMA appear patent. The right common iliac artery is patent. There is greater than 85% stenosis of the left common iliac artery.   The right internal iliac artery is patent. There is greater than 50% stenosis of the proximal left internal iliac artery. The external iliac arteries are patent. The common femoral arteries and visualized portions of the superficial and deep femoral arteries are patent.   The visualized liver appears normal. There is cholelithiasis. The spleen is normal. The pancreas appears normal. The adrenals are normal. Small bilateral renal cysts are noted. The bladder is normal. The prostate is enlarged and contains markers. Large and small bowel loops are otherwise within normal limits. No free fluid or adenopathy. Small fat-containing bilateral hernias are seen.   Bilateral hip arthroplasties are noted. There are degenerative changes of the spine.   IMPRESSION:   Greater than 85% stenosis of the left common iliac artery.   Otherwise this is a preprocedural CTA with various additional findings as above.   DOSE REDUCTION: This exam was performed according to our departmental dose-optimization program which includes automated exposure control, adjustment of the mA and/or kV according to patient size and/or use of iterative reconstruction technique.   Electronically signed by: [place] Engel MD 06/07/2023 01:25 PM EDT RP Workstation: [id number]   Impression:   This 83 year old gentleman has stage D, severe, symptomatic aortic stenosis with NYHA class II symptoms of exertional fatigue and shortness of breath consistent with chronic diastolic congestive heart failure.  I have personally reviewed his 2D echocardiogram, cardiac catheterization, and CTA studies.  His echocardiogram in March 2025 showed severe aortic stenosis with a mean gradient  of 35 mmHg and a valve area by VTI of 0.53 with and a dimensionless index of 0.33.  There was moderate to severe aortic insufficiency.  The patient also has moderate mitral stenosis and normal left ventricular systolic function. Cardiac catheterization showed severe three-vessel coronary disease with 4/4 patent bypass grafts.  There is moderate pulmonary hypertension at 68/28 with a mean 42 mmHg.  Wedge pressure was elevated to 35 mean.  Cardiac index was 2.4.  I agree that aortic valve replacement is indicated in this patient for relief of his symptoms and to prevent progressive left ventricular dysfunction.  He does have significant moderate mitral stenosis with a mean gradient of 11 mmHg but is not a candidate for open double valve placement.  I think his aortic stenosis should be treated with continued medical therapy for his mitral stenosis.  Given his age and prior coronary bypass surgery I do not think he is a candidate for open surgical treatment and TAVR is felt to be the best option for treating him.  His gated cardiac CTA shows anatomy suitable for TAVR using a 26 mm SAPIEN 3 valve.  His abdominal and pelvic CTA shows adequate pelvic vascular anatomy to allow right transfemoral insertion.   The patient and his wife were counseled at length regarding treatment alternatives for management of severe symptomatic aortic stenosis. The risks and benefits of surgical intervention has been discussed in detail. Long-term prognosis with medical therapy was discussed. Alternative approaches such as conventional surgical aortic valve replacement, transcatheter aortic valve replacement, and palliative medical therapy were compared and contrasted at length. This discussion was placed in the context of the patient's own specific clinical presentation and past medical history. All of their questions have been addressed.    Following the decision to proceed with transcatheter aortic valve replacement, a discussion was  held regarding what types of management strategies would be attempted intraoperatively in the event of life-threatening complications, including whether or not the patient would be considered a candidate for the use of cardiopulmonary bypass and/or conversion to open sternotomy for attempted surgical intervention.  I do not think he is a candidate for sternotomy to manage any intraoperative complications and is not a candidate for cardiopulmonary bypass.  The patient has been advised of a variety of complications that might develop including but not limited to risks of death, stroke, paravalvular leak, aortic dissection or other major vascular complications, aortic annulus rupture, device embolization, cardiac rupture or perforation, mitral regurgitation, acute myocardial infarction, arrhythmia, heart block or bradycardia requiring permanent pacemaker placement, congestive heart failure, respiratory failure, renal failure, pneumonia, infection, other late complications related to structural valve deterioration or migration, or other complications that might ultimately cause a temporary or permanent loss of functional independence or other long term morbidity. The patient provides full informed consent for the procedure as described and all questions were answered.     Plan:   Transfemoral TAVR using a SAPIEN 3 valve.       Bartley Lightning, MD

## 2023-07-17 ENCOUNTER — Other Ambulatory Visit: Payer: Self-pay

## 2023-07-17 ENCOUNTER — Inpatient Hospital Stay (HOSPITAL_COMMUNITY)

## 2023-07-17 ENCOUNTER — Inpatient Hospital Stay (HOSPITAL_COMMUNITY): Payer: Self-pay | Admitting: Certified Registered"

## 2023-07-17 ENCOUNTER — Encounter (HOSPITAL_COMMUNITY)
Admission: RE | Disposition: A | Discharge status: Home / Self Care | Payer: Self-pay | Source: Home / Self Care | Attending: Internal Medicine

## 2023-07-17 ENCOUNTER — Encounter (HOSPITAL_COMMUNITY): Payer: Self-pay | Admitting: Internal Medicine

## 2023-07-17 ENCOUNTER — Inpatient Hospital Stay (HOSPITAL_COMMUNITY)
Admission: RE | Admit: 2023-07-17 | Discharge: 2023-07-18 | DRG: 266 | Disposition: A | Discharge status: Home / Self Care | Attending: Internal Medicine | Admitting: Internal Medicine

## 2023-07-17 DIAGNOSIS — I10 Essential (primary) hypertension: Secondary | ICD-10-CM

## 2023-07-17 DIAGNOSIS — I4819 Other persistent atrial fibrillation: Secondary | ICD-10-CM | POA: Diagnosis present

## 2023-07-17 DIAGNOSIS — E119 Type 2 diabetes mellitus without complications: Secondary | ICD-10-CM

## 2023-07-17 DIAGNOSIS — I350 Nonrheumatic aortic (valve) stenosis: Secondary | ICD-10-CM

## 2023-07-17 DIAGNOSIS — Z833 Family history of diabetes mellitus: Secondary | ICD-10-CM | POA: Diagnosis not present

## 2023-07-17 DIAGNOSIS — E782 Mixed hyperlipidemia: Secondary | ICD-10-CM | POA: Diagnosis present

## 2023-07-17 DIAGNOSIS — Z7901 Long term (current) use of anticoagulants: Secondary | ICD-10-CM

## 2023-07-17 DIAGNOSIS — Z8249 Family history of ischemic heart disease and other diseases of the circulatory system: Secondary | ICD-10-CM | POA: Diagnosis not present

## 2023-07-17 DIAGNOSIS — Z96643 Presence of artificial hip joint, bilateral: Secondary | ICD-10-CM | POA: Diagnosis present

## 2023-07-17 DIAGNOSIS — I272 Pulmonary hypertension, unspecified: Secondary | ICD-10-CM | POA: Diagnosis present

## 2023-07-17 DIAGNOSIS — I4891 Unspecified atrial fibrillation: Secondary | ICD-10-CM | POA: Diagnosis present

## 2023-07-17 DIAGNOSIS — Z951 Presence of aortocoronary bypass graft: Secondary | ICD-10-CM

## 2023-07-17 DIAGNOSIS — E1122 Type 2 diabetes mellitus with diabetic chronic kidney disease: Secondary | ICD-10-CM | POA: Diagnosis not present

## 2023-07-17 DIAGNOSIS — I480 Paroxysmal atrial fibrillation: Secondary | ICD-10-CM | POA: Diagnosis present

## 2023-07-17 DIAGNOSIS — Z801 Family history of malignant neoplasm of trachea, bronchus and lung: Secondary | ICD-10-CM | POA: Diagnosis not present

## 2023-07-17 DIAGNOSIS — N400 Enlarged prostate without lower urinary tract symptoms: Secondary | ICD-10-CM | POA: Diagnosis present

## 2023-07-17 DIAGNOSIS — Z7984 Long term (current) use of oral hypoglycemic drugs: Secondary | ICD-10-CM | POA: Diagnosis not present

## 2023-07-17 DIAGNOSIS — I447 Left bundle-branch block, unspecified: Secondary | ICD-10-CM | POA: Diagnosis present

## 2023-07-17 DIAGNOSIS — I130 Hypertensive heart and chronic kidney disease with heart failure and stage 1 through stage 4 chronic kidney disease, or unspecified chronic kidney disease: Secondary | ICD-10-CM | POA: Diagnosis present

## 2023-07-17 DIAGNOSIS — N182 Chronic kidney disease, stage 2 (mild): Secondary | ICD-10-CM | POA: Diagnosis present

## 2023-07-17 DIAGNOSIS — I5033 Acute on chronic diastolic (congestive) heart failure: Secondary | ICD-10-CM | POA: Diagnosis not present

## 2023-07-17 DIAGNOSIS — Z87891 Personal history of nicotine dependence: Secondary | ICD-10-CM

## 2023-07-17 DIAGNOSIS — I080 Rheumatic disorders of both mitral and aortic valves: Principal | ICD-10-CM | POA: Diagnosis present

## 2023-07-17 DIAGNOSIS — M48062 Spinal stenosis, lumbar region with neurogenic claudication: Secondary | ICD-10-CM | POA: Diagnosis present

## 2023-07-17 DIAGNOSIS — Z006 Encounter for examination for normal comparison and control in clinical research program: Secondary | ICD-10-CM

## 2023-07-17 DIAGNOSIS — I2510 Atherosclerotic heart disease of native coronary artery without angina pectoris: Secondary | ICD-10-CM | POA: Diagnosis not present

## 2023-07-17 DIAGNOSIS — Z952 Presence of prosthetic heart valve: Secondary | ICD-10-CM | POA: Diagnosis not present

## 2023-07-17 DIAGNOSIS — Z83438 Family history of other disorder of lipoprotein metabolism and other lipidemia: Secondary | ICD-10-CM

## 2023-07-17 DIAGNOSIS — Z8546 Personal history of malignant neoplasm of prostate: Secondary | ICD-10-CM | POA: Diagnosis not present

## 2023-07-17 DIAGNOSIS — Z79899 Other long term (current) drug therapy: Secondary | ICD-10-CM | POA: Diagnosis not present

## 2023-07-17 DIAGNOSIS — Z9841 Cataract extraction status, right eye: Secondary | ICD-10-CM

## 2023-07-17 DIAGNOSIS — I5031 Acute diastolic (congestive) heart failure: Secondary | ICD-10-CM | POA: Diagnosis present

## 2023-07-17 DIAGNOSIS — Z9842 Cataract extraction status, left eye: Secondary | ICD-10-CM

## 2023-07-17 DIAGNOSIS — Z886 Allergy status to analgesic agent status: Secondary | ICD-10-CM

## 2023-07-17 HISTORY — DX: Presence of aortocoronary bypass graft: Z95.1

## 2023-07-17 HISTORY — PX: INTRAOPERATIVE TRANSTHORACIC ECHOCARDIOGRAM: SHX6523

## 2023-07-17 HISTORY — DX: Nonrheumatic aortic (valve) stenosis: I35.0

## 2023-07-17 LAB — GLUCOSE, CAPILLARY
Glucose-Capillary: 197 mg/dL — ABNORMAL HIGH (ref 70–99)
Glucose-Capillary: 121 mg/dL — ABNORMAL HIGH (ref 70–99)
Glucose-Capillary: 213 mg/dL — ABNORMAL HIGH (ref 70–99)

## 2023-07-17 LAB — ECHOCARDIOGRAM LIMITED
AR max vel: 2.29 cm2
AV Area VTI: 2.53 cm2
AV Area mean vel: 2.35 cm2
AV Mean grad: 4 mmHg
AV Peak grad: 6.3 mmHg
Ao pk vel: 1.25 m/s
MV VTI: 0.64 cm2

## 2023-07-17 LAB — POCT I-STAT, CHEM 8
BUN: 14 mg/dL (ref 8–23)
Calcium, Ion: 1.2 mmol/L (ref 1.15–1.40)
Chloride: 107 mmol/L (ref 98–111)
Creatinine, Ser: 0.5 mg/dL — ABNORMAL LOW (ref 0.61–1.24)
Glucose, Bld: 186 mg/dL — ABNORMAL HIGH (ref 70–99)
HCT: 38 % — ABNORMAL LOW (ref 39.0–52.0)
Hemoglobin: 12.9 g/dL — ABNORMAL LOW (ref 13.0–17.0)
Potassium: 3.7 mmol/L (ref 3.5–5.1)
Sodium: 140 mmol/L (ref 135–145)
TCO2: 21 mmol/L — ABNORMAL LOW (ref 22–32)

## 2023-07-17 LAB — POCT ACTIVATED CLOTTING TIME
Activated Clotting Time: 297 s
Activated Clotting Time: 130 s

## 2023-07-17 SURGERY — TRANSCATHETER AORTIC VALVE REPLACEMENT, TRANSFEMORAL (CATHLAB)
Anesthesia: Monitor Anesthesia Care | Laterality: Right

## 2023-07-17 MED ORDER — PROPOFOL 500 MG/50ML IV EMUL
INTRAVENOUS | Status: DC | PRN
  Administered 2023-07-17: 25 ug/kg/min via INTRAVENOUS

## 2023-07-17 MED ORDER — ACETAMINOPHEN 650 MG RE SUPP: 650.0000 mg | Freq: Four times a day (QID) | RECTAL | Status: DC | PRN

## 2023-07-17 MED ORDER — ACETAMINOPHEN 500 MG PO TABS: 1000.0000 mg | ORAL_TABLET | Freq: Once | ORAL | Status: DC

## 2023-07-17 MED ORDER — INSULIN ASPART 100 UNIT/ML IJ SOLN
INTRAMUSCULAR | Status: AC
  Administered 2023-07-17: 2 [IU] via SUBCUTANEOUS
  Filled 2023-07-17: qty 1

## 2023-07-17 MED ORDER — EZETIMIBE 10 MG PO TABS
10.0000 mg | ORAL_TABLET | Freq: Every day | ORAL | Status: DC
Start: 2023-07-17 — End: 2023-07-18
  Administered 2023-07-18: 10 mg via ORAL
  Filled 2023-07-17: qty 1

## 2023-07-17 MED ORDER — PROTAMINE SULFATE 10 MG/ML IV SOLN
INTRAVENOUS | Status: DC | PRN
  Administered 2023-07-17: 150 mg via INTRAVENOUS

## 2023-07-17 MED ORDER — CHLORHEXIDINE GLUCONATE 4 % EX SOLN: 30.0000 mL | CUTANEOUS | Status: DC

## 2023-07-17 MED ORDER — CHLORHEXIDINE GLUCONATE 4 % EX SOLN: 60.0000 mL | Freq: Once | CUTANEOUS | Status: DC

## 2023-07-17 MED ORDER — HEPARIN (PORCINE) IN NACL 1000-0.9 UT/500ML-% IV SOLN
INTRAVENOUS | Status: DC | PRN
  Administered 2023-07-17 (×2): 500 mL

## 2023-07-17 MED ORDER — SODIUM CHLORIDE 0.9 % IV SOLN: INTRAVENOUS | Status: AC

## 2023-07-17 MED ORDER — NOREPINEPHRINE 4 MG/250ML-% IV SOLN
0.0000 ug/min | INTRAVENOUS | Status: DC
  Filled 2023-07-17: qty 250

## 2023-07-17 MED ORDER — EMPAGLIFLOZIN 25 MG PO TABS
25.0000 mg | ORAL_TABLET | Freq: Every day | ORAL | Status: DC
Start: 2023-07-17 — End: 2023-07-18
  Administered 2023-07-18: 25 mg via ORAL
  Filled 2023-07-17 (×2): qty 1

## 2023-07-17 MED ORDER — IODIXANOL 320 MG/ML IV SOLN
INTRAVENOUS | Status: DC | PRN
  Administered 2023-07-17: 70 mL

## 2023-07-17 MED ORDER — FENTANYL CITRATE (PF) 100 MCG/2ML IJ SOLN
INTRAMUSCULAR | Status: AC
Start: 2023-07-17 — End: 2023-07-17
  Filled 2023-07-17: qty 2

## 2023-07-17 MED ORDER — MELATONIN 5 MG PO TABS
10.0000 mg | ORAL_TABLET | Freq: Every day | ORAL | Status: DC
  Administered 2023-07-17: 10 mg via ORAL
  Filled 2023-07-17: qty 2

## 2023-07-17 MED ORDER — INSULIN ASPART 100 UNIT/ML IJ SOLN: 0.0000 [IU] | INTRAMUSCULAR | Status: DC | PRN

## 2023-07-17 MED ORDER — CLEVIDIPINE BUTYRATE 0.5 MG/ML IV EMUL
INTRAVENOUS | Status: DC | PRN
  Administered 2023-07-17: 2 mg/h via INTRAVENOUS

## 2023-07-17 MED ORDER — ONDANSETRON HCL 4 MG/2ML IJ SOLN: 4.0000 mg | Freq: Four times a day (QID) | INTRAMUSCULAR | Status: DC | PRN

## 2023-07-17 MED ORDER — SODIUM CHLORIDE 0.9 % IV SOLN: INTRAVENOUS | Status: DC

## 2023-07-17 MED ORDER — CHLORHEXIDINE GLUCONATE 0.12 % MT SOLN
15.0000 mL | Freq: Once | OROMUCOSAL | Status: AC
  Administered 2023-07-17: 15 mL via OROMUCOSAL
  Filled 2023-07-17: qty 15

## 2023-07-17 MED ORDER — HEPARIN SODIUM (PORCINE) 1000 UNIT/ML IJ SOLN
INTRAMUSCULAR | Status: DC | PRN
Start: 2023-07-17 — End: 2023-07-17
  Administered 2023-07-17: 15000 [IU] via INTRAVENOUS

## 2023-07-17 MED ORDER — SODIUM CHLORIDE 0.9% FLUSH
3.0000 mL | Freq: Two times a day (BID) | INTRAVENOUS | Status: DC
  Administered 2023-07-17 – 2023-07-18 (×2): 3 mL via INTRAVENOUS

## 2023-07-17 MED ORDER — SODIUM CHLORIDE 0.9 % IV SOLN: INTRAVENOUS | Status: DC | PRN

## 2023-07-17 MED ORDER — LIDOCAINE HCL (PF) 1 % IJ SOLN
INTRAMUSCULAR | Status: DC | PRN
  Administered 2023-07-17 (×3): 5 mL via INTRADERMAL

## 2023-07-17 MED ORDER — SODIUM CHLORIDE 0.9 % IV SOLN: 250.0000 mL | INTRAVENOUS | Status: DC | PRN

## 2023-07-17 MED ORDER — ACETAMINOPHEN 325 MG PO TABS
650.0000 mg | ORAL_TABLET | Freq: Four times a day (QID) | ORAL | Status: DC | PRN
  Administered 2023-07-18: 650 mg via ORAL
  Filled 2023-07-17: qty 2

## 2023-07-17 MED ORDER — FENTANYL CITRATE (PF) 100 MCG/2ML IJ SOLN
INTRAMUSCULAR | Status: DC | PRN
  Administered 2023-07-17 (×3): 25 ug via INTRAVENOUS

## 2023-07-17 MED ORDER — OXYCODONE HCL 5 MG PO TABS: 5.0000 mg | ORAL_TABLET | ORAL | Status: DC | PRN

## 2023-07-17 MED ORDER — CEFAZOLIN SODIUM-DEXTROSE 2-4 GM/100ML-% IV SOLN
2.0000 g | Freq: Three times a day (TID) | INTRAVENOUS | Status: AC
  Administered 2023-07-17 – 2023-07-18 (×2): 2 g via INTRAVENOUS
  Filled 2023-07-17 (×2): qty 100

## 2023-07-17 MED ORDER — TRAMADOL HCL 50 MG PO TABS: 50.0000 mg | ORAL_TABLET | ORAL | Status: DC | PRN

## 2023-07-17 MED ORDER — VENLAFAXINE HCL ER 37.5 MG PO CP24
37.5000 mg | ORAL_CAPSULE | Freq: Every day | ORAL | Status: DC
  Administered 2023-07-18: 37.5 mg via ORAL
  Filled 2023-07-17: qty 1

## 2023-07-17 MED ORDER — ALLOPURINOL 300 MG PO TABS
300.0000 mg | ORAL_TABLET | Freq: Every day | ORAL | Status: DC
  Administered 2023-07-18: 300 mg via ORAL
  Filled 2023-07-17: qty 1

## 2023-07-17 MED ORDER — ROSUVASTATIN CALCIUM 5 MG PO TABS
10.0000 mg | ORAL_TABLET | Freq: Every day | ORAL | Status: DC
  Administered 2023-07-18: 10 mg via ORAL
  Filled 2023-07-17: qty 2

## 2023-07-17 MED ORDER — NITROGLYCERIN IN D5W 200-5 MCG/ML-% IV SOLN: 0.0000 ug/min | INTRAVENOUS | Status: DC

## 2023-07-17 MED ORDER — INSULIN ASPART 100 UNIT/ML IJ SOLN
0.0000 [IU] | Freq: Three times a day (TID) | INTRAMUSCULAR | Status: DC
  Administered 2023-07-17: 8 [IU] via SUBCUTANEOUS
  Administered 2023-07-18: 2 [IU] via SUBCUTANEOUS

## 2023-07-17 MED ORDER — SODIUM CHLORIDE 0.9% FLUSH: 3.0000 mL | INTRAVENOUS | Status: DC | PRN

## 2023-07-17 MED ORDER — MORPHINE SULFATE (PF) 2 MG/ML IV SOLN: 1.0000 mg | INTRAVENOUS | Status: DC | PRN

## 2023-07-17 SURGICAL SUPPLY — 29 items
BAG SNAP BAND KOVER 36X36 (MISCELLANEOUS) ×4 IMPLANT
CABLE SURGICAL S-101-97-12 (CABLE) IMPLANT
CATH 26 ULTRA DELIVERY (CATHETERS) IMPLANT
CATH DIAG 6FR PIGTAIL ANGLED (CATHETERS) IMPLANT
CATH INFINITI 5FR ANG PIGTAIL (CATHETERS) IMPLANT
CATH INFINITI 6F AL2 (CATHETERS) IMPLANT
CATH INFINITI JR4 5F (CATHETERS) IMPLANT
CLOSURE PERCLOSE PROSTYLE (VASCULAR PRODUCTS) IMPLANT
CRIMPER (MISCELLANEOUS) IMPLANT
DEVICE INFLATION ATRION QL2530 (MISCELLANEOUS) IMPLANT
ELECT DEFIB PAD ADLT CADENCE (PAD) IMPLANT
GUIDEWIRE ANGLED .035X260CM (WIRE) IMPLANT
KIT SAPIAN 3 ULTRA RESILIA 26 (Valve) IMPLANT
PACK CARDIAC CATHETERIZATION (CUSTOM PROCEDURE TRAY) ×2 IMPLANT
SET ATX-X65L (MISCELLANEOUS) IMPLANT
SET MICROPUNCTURE 5F STIFF (MISCELLANEOUS) IMPLANT
SHEATH INTRODUCER SET 20-26 (SHEATH) IMPLANT
SHEATH PINNACLE 6F 10CM (SHEATH) IMPLANT
SHEATH PINNACLE 8F 10CM (SHEATH) IMPLANT
STOPCOCK MORSE 400PSI 3WAY (MISCELLANEOUS) ×4 IMPLANT
TRANSDUCER W/STOPCOCK (MISCELLANEOUS) IMPLANT
TUBING ART PRESS 72 MALE/FEM (TUBING) IMPLANT
TUBING CIL FLEX 10 FLL-RA (TUBING) IMPLANT
WIRE AMPLATZ SS-J .035X180CM (WIRE) IMPLANT
WIRE EMERALD 3MM-J .035X150CM (WIRE) IMPLANT
WIRE EMERALD 3MM-J .035X260CM (WIRE) IMPLANT
WIRE EMERALD ST .035X260CM (WIRE) IMPLANT
WIRE MICRO SET SILHO 5FR 7 (SHEATH) IMPLANT
WIRE SAFARI SM CURVE 275 (WIRE) IMPLANT

## 2023-07-17 NOTE — Op Note (Signed)
 [facility]   MULTIDISCIPLINARY HEART VALVE TEAM   TAVR OPERATIVE NOTE   Date of Procedure:  07/17/2023  Preoperative Diagnosis: Severe Aortic Stenosis   Postoperative Diagnosis: Same   Procedure:   Transcatheter Aortic Valve Replacement - Percutaneous Right Transfemoral Approach  Edwards Sapien 3 Ultra Resilia THV (size 26 mm, model # 9755RSL, serial # [id number])   Co-Surgeons:  Bartley Lightning, MD and Alyssa Backbone, MD   Anesthesiologist:  Fay Hoop, MD  Echocardiographer:  P. Stann Earnest, MD  Pre-operative Echo Findings: Severe aortic stenosis Normal left ventricular systolic function  Post-operative Echo Findings: No paravalvular leak Normal left ventricular systolic function   BRIEF CLINICAL NOTE AND INDICATIONS FOR SURGERY  This 83 year old gentleman has stage D, severe, symptomatic aortic stenosis with NYHA class II symptoms of exertional fatigue and shortness of breath consistent with chronic diastolic congestive heart failure.  I have personally reviewed his 2D echocardiogram, cardiac catheterization, and CTA studies.  His echocardiogram in March 2025 showed severe aortic stenosis with a mean gradient of 35 mmHg and a valve area by VTI of 0.53 with and a dimensionless index of 0.33.  There was moderate to severe aortic insufficiency.  The patient also has moderate mitral stenosis and normal left ventricular systolic function. Cardiac catheterization showed severe three-vessel coronary disease with 4/4 patent bypass grafts.  There is moderate pulmonary hypertension at 68/28 with a mean 42 mmHg.  Wedge pressure was elevated to 35 mean.  Cardiac index was 2.4.  I agree that aortic valve replacement is indicated in this patient for relief of his symptoms and to prevent progressive left ventricular dysfunction.  He does have significant moderate mitral stenosis with a mean gradient of 11 mmHg but is not a candidate for open double valve placement.  I think his  aortic stenosis should be treated with continued medical therapy for his mitral stenosis.  Given his age and prior coronary bypass surgery I do not think he is a candidate for open surgical treatment and TAVR is felt to be the best option for treating him.  His gated cardiac CTA shows anatomy suitable for TAVR using a 26 mm SAPIEN 3 valve.  His abdominal and pelvic CTA shows adequate pelvic vascular anatomy to allow right transfemoral insertion.   The patient and his wife were counseled at length regarding treatment alternatives for management of severe symptomatic aortic stenosis. The risks and benefits of surgical intervention has been discussed in detail. Long-term prognosis with medical therapy was discussed. Alternative approaches such as conventional surgical aortic valve replacement, transcatheter aortic valve replacement, and palliative medical therapy were compared and contrasted at length. This discussion was placed in the context of the patient's own specific clinical presentation and past medical history. All of their questions have been addressed.    Following the decision to proceed with transcatheter aortic valve replacement, a discussion was held regarding what types of management strategies would be attempted intraoperatively in the event of life-threatening complications, including whether or not the patient would be considered a candidate for the use of cardiopulmonary bypass and/or conversion to open sternotomy for attempted surgical intervention.  I do not think he is a candidate for sternotomy to manage any intraoperative complications and is not a candidate for cardiopulmonary bypass.  The patient has been advised of a variety of complications that might develop including but not limited to risks of death, stroke, paravalvular leak, aortic dissection or other major vascular complications, aortic annulus rupture, device embolization, cardiac rupture or  perforation, mitral regurgitation,  acute myocardial infarction, arrhythmia, heart block or bradycardia requiring permanent pacemaker placement, congestive heart failure, respiratory failure, renal failure, pneumonia, infection, other late complications related to structural valve deterioration or migration, or other complications that might ultimately cause a temporary or permanent loss of functional independence or other long term morbidity. The patient provides full informed consent for the procedure as described and all questions were answered.       DETAILS OF THE OPERATIVE PROCEDURE  PREPARATION:    The patient was brought to the operating room on the above mentioned date and appropriate monitoring was established by the anesthesia team. The patient was placed in the supine position on the operating table.  Intravenous antibiotics were administered. The patient was monitored closely throughout the procedure under conscious sedation.    Baseline transthoracic echocardiogram was performed. The patient's abdomen and both groins were prepped and draped in a sterile manner. A time out procedure was performed.   PERIPHERAL ACCESS:    Using the modified Seldinger technique, femoral arterial access was obtained with placement of a 6 Fr sheath on the left side.  A pigtail diagnostic catheter was passed through the left arterial sheath under fluoroscopic guidance into the aortic root. Aortic root angiography was performed in order to determine the optimal angiographic angle for valve deployment.   TRANSFEMORAL ACCESS:   Percutaneous transfemoral access and sheath placement was performed using ultrasound guidance.  The right common femoral artery was cannulated using a micropuncture needle and appropriate location was verified using hand injection angiogram.  A pair of Abbott Perclose percutaneous closure devices were placed and a 6 French sheath replaced into the femoral artery.  The patient was heparinized systemically and ACT  verified > 250 seconds.    A 14 Fr transfemoral E-sheath was introduced into the right common femoral artery after progressively dilating over an Amplatz superstiff wire. An AL-1 catheter was used to direct a straight-tip exchange length wire across the native aortic valve into the left ventricle. This was exchanged out for a pigtail catheter and position was confirmed in the LV apex. Simultaneous LV and Ao pressures were recorded.  The pigtail catheter was exchanged for a Safari wire in the LV apex. Direct LV pacing threshold through the [facility] wire was tested and was satisfactory.  BALLOON AORTIC VALVULOPLASTY:   Not performed    TRANSCATHETER HEART VALVE DEPLOYMENT:   An Edwards Sapien 3 Ultra transcatheter heart valve (size 26 mm minus 1cc) was prepared and crimped per manufacturer's guidelines, and the proper orientation of the valve is confirmed on the [employer] delivery system. The valve was advanced through the introducer sheath using normal technique until in an appropriate position in the abdominal aorta beyond the sheath tip. The balloon was then retracted and using the fine-tuning wheel was centered on the valve. The valve was then advanced across the aortic arch using appropriate flexion of the catheter. The valve was carefully positioned across the aortic valve annulus. The Commander catheter was retracted using normal technique. Once final position of the valve has been confirmed by angiographic assessment, the valve is deployed during rapid ventricular pacing to maintain systolic blood pressure < 50 mmHg and pulse pressure < 10 mmHg. The balloon inflation is held for >3 seconds after reaching full deployment volume. Once the balloon has fully deflated the balloon is retracted into the ascending aorta and valve function is assessed using echocardiography. There is felt to be no paravalvular leak and no central aortic insufficiency.  The patient's hemodynamic recovery following valve  deployment is good.  The deployment balloon and guidewire are both removed.    PROCEDURE COMPLETION:   The sheath was removed and femoral artery closure performed.  Protamine was administered once femoral arterial repair was complete. The pigtail catheter and femoral sheath were removed. Direct pressure was utilized following removal of the diagnostic sheath in the left femoral artery.  The patient tolerated the procedure well and is transported to the cath lab recovery area in stable condition. There were no immediate intraoperative complications. All sponge instrument and needle counts are verified correct at completion of the operation.   No blood products were administered during the operation.  The patient received a total of 70 mL of intravenous contrast during the procedure.   Bartley Lightning, MD 07/17/2023

## 2023-07-17 NOTE — Anesthesia Preprocedure Evaluation (Addendum)
 Anesthesia Evaluation  Patient identified by MRN, date of birth, ID band Patient awake    Reviewed: Allergy & Precautions, NPO status , Patient's Chart, lab work & pertinent test results  History of Anesthesia Complications Negative for: history of anesthetic complications  Airway Mallampati: I  TM Distance: >3 FB Neck ROM: Full    Dental  (+) Dental Advisory Given, Caps   Pulmonary former smoker   breath sounds clear to auscultation       Cardiovascular hypertension, + CAD and + CABG (grafts patent)  + dysrhythmias Atrial Fibrillation + Valvular Problems/Murmurs (severe MS, severe AS) AS  Rhythm:Irregular Rate:Normal  03/2023 Cath:  1. 3 vessel occlusive CAD 2. Patent LIMA to the LAD 3. Patent SVG to first diagonal 4. Patent SVG to OM 5. Patent SVG to PDA. Left to right collaterals to the PL and mid RCA 6. Moderate pulmonary HTN. PAP 68/28, mean 42 mm Hg 7. Elevated PCWP 30/45, mean 35 mm Hg 8. Cardiac output 4.22 L/min with index 2.04  03/2023 ECHO: EF 65 to 70%.  1. The LV has normal function, no regional wall motion abnormalities. There is moderate LVH  2. RVF is normal. The right ventricular size is normal. There is mildly elevated pulmonary artery systolic pressure. The estimated right ventricular systolic pressure is 38.5 mmHg.   3. Left atrial size was mildly dilated.   4. The mitral valve is degenerative and heavily calcified. Mild mitral valve regurgitation. Moderate mitral stenosis. The mean mitral valve gradient is 11.0 mmHg with average heart rate of 73 bpm.   5. The aortic valve is calcified. The left and right coronary cusps appear fixed. There is moderate calcification of the aortic valve. Aortic valve regurgitation is moderate to severe. Severe aortic valve stenosis. Aortic valve area, by VTI measures  0.53 cm. Aortic valve mean gradient measures 35.0 mmHg. Aortic valve Vmax  measures 3.70 m/s. Peak gradient 54.8  mmHg, DI 0.23.     Neuro/Psych   Anxiety     negative neurological ROS     GI/Hepatic negative GI ROS, Neg liver ROS,,,  Endo/Other  diabetes (glu 197), Oral Hypoglycemic Agents    Renal/GU negative Renal ROS     Musculoskeletal  (+) Arthritis ,    Abdominal   Peds  Hematology  (+) Blood dyscrasia Eliquis  Hb 16.3, plt 287k   Anesthesia Other Findings Prostate cancer  Reproductive/Obstetrics                             Anesthesia Physical Anesthesia Plan  ASA: 4  Anesthesia Plan: MAC   Post-op Pain Management: Tylenol  PO (pre-op)*   Induction:   PONV Risk Score and Plan: 1 and Treatment may vary due to age or medical condition  Airway Management Planned: Natural Airway and Simple Face Mask  Additional Equipment: ClearSight  Intra-op Plan:   Post-operative Plan:   Informed Consent: I have reviewed the patients History and Physical, chart, labs and discussed the procedure including the risks, benefits and alternatives for the proposed anesthesia with the patient or authorized representative who has indicated his/her understanding and acceptance.     Dental advisory given  Plan Discussed with: CRNA and Surgeon  Anesthesia Plan Comments:        Anesthesia Quick Evaluation

## 2023-07-17 NOTE — Progress Notes (Signed)
  [facility]   MULTIDISCIPLINARY HEART VALVE TEAM  Patient doing well s/p TAVR. He is hemodynamically stable but BPs are soft. Groin sites stable. ECG/tele with afib with SVR but no high grade block. LVEDP elevated at 31 mm hg at the time of TAVR but BP too soft to diurese currently. Will give IV lasix in the AM. Plan to transfer to from cath lab holding to 4E when bed available. Early ambulation after bedrest completed and hopeful discharge over the next 24-48 hours.   Abagail Hoar PA-C  [facility]  Pager [phone]

## 2023-07-17 NOTE — Discharge Summary (Addendum)
 [facility]   MULTIDISCIPLINARY HEART VALVE TEAM  Discharge Summary    Patient ID: Bryce Maldonado MRN: [id number]; DOB: [birth year]  Admit date: 07/17/2023 Discharge date: 07/18/2023  PCP:  Colene Dauphin, MD  [facility] Cardiologist:  Peter Swaziland, MD  [facility] Structural heart: Kyra Phy, MD [facility] Electrophysiologist:  None   Discharge Diagnoses    Principal Problem:   S/P TAVR (transcatheter aortic valve replacement) Active Problems:   Mixed hyperlipidemia   PROSTATE CANCER, HX OF   Type II diabetes mellitus (HCC)   PAF (paroxysmal atrial fibrillation) (HCC)   Atrial fibrillation (HCC)   Hypertension   Severe aortic stenosis   Spinal stenosis of lumbar region with neurogenic claudication   S/P CABG (coronary artery bypass graft)   Acute heart failure with preserved ejection fraction (HFpEF) (HCC)   Allergies Allergies  Allergen Reactions   Naproxen Sodium Rash    Diagnostic Studies/Procedures    [facility]  TAVR OPERATIVE NOTE     Date of Procedure:                07/17/2023   Preoperative Diagnosis:      Severe Aortic Stenosis    Postoperative Diagnosis:    Same    Procedure:      [id number]  Transcatheter Aortic Valve Replacement - Transfemoral Approach             Edwards Sapien 3 Resilia THV (size 26 mm, model # 9755RLS, serial # [id number])              Co-Surgeons:                         Bartley Lightning, MD and Alyssa Backbone, MD Anesthesiologist:                  Cleora Daft   Echocardiographer:              Stann Earnest   Pre-operative Echo Findings: Severe aortic stenosis Normal left ventricular systolic function   Post-operative Echo Findings: No paravalvular leak Normal left ventricular systolic function   Left Heart Catheterization Findings: Left ventricular end-diastolic pressure of   _____________    Echo 07/18/23: completed but pending formal read at the time of discharge    History of Present Illness     Bryce Maldonado is a 83 y.o. male with a history of HTN, HLD, DMT2, CKD stage II, persistent atrial fib s/p ablation on Eliquis , CAD s/p CABG (LIMA to LAD, vein graft to OM, vein graft to PDA in 1995), moderate MS, and severe AS who presented to [facility] on 07/17/23 for planned TAVR.   Over the past few months he has had progressive exertional fatigue and shortness of breath with walking and going up stairs. His most recent echo on 04/12/2023 showed EF 65%, mod LVH, severe AS, mean grad 35 mmHg, 3.40m/s, AVA 0.63 cm2, as well as mod MS with mean gradient of 11 mm hg. Cath 3/25/2 showed severe three-vessel coronary disease with 4/4 patent bypass grafts, moderate pulmonary hypertension and elevated wedge pressure at 35 mmhg.   The patient was evaluated by the multidisciplinary valve team and felt to have severe, symptomatic aortic stenosis and to be a suitable candidate for TAVR, which was set up for 07/17/23.   Hospital Course     Consultants: none   Severe AS:  -- S/p successful TAVR with a 26 mm  Edwards Sapien 3 Ultra Resilia  THV via the TF approach on 07/17/23.  -- Post operative echo completed but pending formal read. -- Groin sites are stable.  -- Resume Eliquis  5mg  BID. -- Met with cardiac rehab to discuss CRP phase II.  -- Plan for discharge home today with close follow up in the outpatient setting.   New LBBB: -- Noted after TAVR.  -- Discharge with a Zio AT to rule out delayed HAVB.   Persistent atrial fibrillation: -- Rate controlled without AV nodal blocking agents.  -- Resume Eliquis  5mg  BID.   Acute HFpEF: -- LVEDP elevated to 31 mmhg at the time of TAVR.  -- Treated with IV Lasix 40mg  x1.  -- Will start Lasix 20mg  daily at discharge.  -- Continue Jardiance  25 mg daily.  -- BMET at follow up.   HTN: -- BP elevated. -- Resume home losartan  25 mg daily.    DMT2:  -- Treated with SSI while admitted.  -- Resume home meds at discharge.  --  Okay to resume Metformin  after 48 hours after contrast dye exposure (6/20 AM)   Mitral stenosis: -- Not a valve surgery candidate.  -- Continue medication therapy.   _____________  Discharge Vitals Blood pressure (!) 151/62, pulse 80, temperature 98.3 F (36.8 C), temperature source Oral, resp. rate 13, height 5' 11 (1.803 m), weight 87.2 kg, SpO2 95%.  Filed Weights   07/17/23 1114 07/18/23 0319  Weight: 85.7 kg 87.2 kg     GEN: Well nourished, well developed in no acute distress NECK: No JVD CARDIAC: RRR, soft flow murmur  @ RUSB. no rubs, gallops RESPIRATORY:  Clear to auscultation without rales, wheezing or rhonchi  ABDOMEN: Soft, non-tender, non-distended EXTREMITIES:  No edema; No deformity.  Groin sites clear without hematoma or ecchymosis.    Disposition   Pt is being discharged home today in good condition.  Follow-up Plans & Appointments     Follow-up Information     Ardia Kraft, PA-C. Go on 07/23/2023.   Specialties: Cardiology, Radiology Why: @ 10:20am, please arrive at least 15 minutes early. Contact information: [address] [place] [address] [phone]                  Discharge Medications   Allergies as of 07/18/2023       Reactions   Naproxen Sodium Rash        Medication List     PAUSE taking these medications    metFORMIN  500 MG tablet Wait to take this until: July 20, 2023 Morning Commonly known as: GLUCOPHAGE  TAKE 2 TABLETS(1000 MG) BY MOUTH TWICE DAILY WITH A MEAL       TAKE these medications    allopurinol  300 MG tablet Commonly known as: ZYLOPRIM  TAKE 1 TABLET(300 MG) BY MOUTH DAILY   CENTRUM PO Take 1 tablet by mouth daily. Silver   Eliquis  5 MG Tabs tablet Generic drug: apixaban  TAKE 1 TABLET(5 MG) BY MOUTH TWICE DAILY   ezetimibe  10 MG tablet Commonly known as: ZETIA  TAKE 1 TABLET(10 MG) BY MOUTH AT BEDTIME   glimepiride  2 MG tablet Commonly known as: AMARYL  TAKE 1 TABLET(2  MG) BY MOUTH DAILY BEFORE BREAKFAST   Jardiance  25 MG Tabs tablet Generic drug: empagliflozin  TAKE 1 TABLET(25 MG) BY MOUTH DAILY   losartan  25 MG tablet Commonly known as: COZAAR  TAKE 1 TABLET(25 MG) BY MOUTH DAILY   Melatonin 10 MG Tabs Take 10 mg by mouth at bedtime.   OVER THE  COUNTER MEDICATION Take 3 capsules by mouth daily. Balance of Nature Fruits and Veggies   rosuvastatin  10 MG tablet Commonly known as: CRESTOR  TAKE 1 TABLET(10 MG) BY MOUTH AT BEDTIME   sodium chloride  0.65 % Soln nasal spray Commonly known as: OCEAN Place 1 spray into both nostrils at bedtime as needed for congestion.   Systane Hydration PF 0.4-0.3 % Soln Generic drug: Polyethyl Glyc-Propyl Glyc PF Place 1 drop into both eyes daily as needed (Dry eyes).   venlafaxine  XR 37.5 MG 24 hr capsule Commonly known as: EFFEXOR -XR TAKE 1 CAPSULE(37.5 MG) BY MOUTH DAILY WITH BREAKFAST            Outstanding Labs/Studies   BMET  ______________________  Duration of Discharge Encounter: APP Time: 20 minutes    Signed, Abagail Hoar, PA-C 07/18/2023, 10:02 AM [phone]   ATTENDING ATTESTATION:  After conducting a review of all available clinical information with the care team, interviewing the patient, and performing a physical exam, I agree with the findings and plan described in this note.   GEN: No acute distress.   HEENT:  MMM, no JVD, no scleral icterus Cardiac: RRR, no murmurs, rubs, or gallops.  Respiratory: Clear to auscultation bilaterally. GI: Soft, nontender, non-distended  MS: No edema; No deformity. Neuro:  Nonfocal  Vasc:  +2 radial pulses; groin sites stable  Patient doing well after uncomplicated TAVR with 26 mm SAPIEN 3 valve from the right transfemoral approach.  The patient did develop a new left bundle branch block likely due to significant LVOT calcium .  Will place a ZIO monitor to rule out heart block.  Follow-up echocardiogram with plan discharge home with  close hospital follow-up.  APP discharge time:20 MD discharge time:22  Alyssa Backbone, MD Pager [phone]

## 2023-07-17 NOTE — Transfer of Care (Signed)
 Immediate Anesthesia Transfer of Care Note  Patient: Bryce Maldonado  Procedure(s) Performed: Transcatheter Aortic Valve Replacement, Transfemoral (Right) ECHOCARDIOGRAM, TRANSTHORACIC  Patient Location: PACU and Cath Lab  Anesthesia Type:MAC  Level of Consciousness: awake and sedated  Airway & Oxygen Therapy: Patient Spontanous Breathing and Patient connected to nasal cannula oxygen  Post-op Assessment: Report given to RN and Post -op Vital signs reviewed and stable  Post vital signs: Reviewed and stable  Last Vitals:  Vitals Value Taken Time  BP    Temp    Pulse    Resp    SpO2      Last Pain:  Vitals:   07/17/23 1129  TempSrc:   PainSc: 0-No pain      Patients Stated Pain Goal: 0 (07/17/23 1129)  Complications: There were no known notable events for this encounter.

## 2023-07-17 NOTE — Op Note (Signed)
 [facility]  TAVR OPERATIVE NOTE   Date of Procedure:  07/17/2023  Preoperative Diagnosis: Severe Aortic Stenosis   Postoperative Diagnosis: Same   Procedure:  [id number]  Transcatheter Aortic Valve Replacement - Transfemoral Approach  Edwards Sapien 3 Resilia THV (size 26 mm, model # 9755RLS, serial # [id number])   Co-Surgeons:   Bartley Lightning, MD and Alyssa Backbone, MD Anesthesiologist:  Cleora Daft  Echocardiographer:  Stann Earnest  Pre-operative Echo Findings: Severe aortic stenosis Normal left ventricular systolic function  Post-operative Echo Findings: No paravalvular leak Normal left ventricular systolic function  Left Heart Catheterization Findings: Left ventricular end-diastolic pressure of   BRIEF CLINICAL NOTE AND INDICATIONS FOR SURGERY  The patient is an 83 year old male with history of severe symptomatic aortic stenosis, moderate mitral stenosis, coronary artery disease status post CABG, hypertension, hyperlipidemia, type 2 diabetes, CKD stage II, and persistent atrial fibrillation status post ablation on Eliquis  who is referred for elective transcatheter aortic valve replacement with a 26 mm SAPIEN 3 valve from the right transfemoral approach.  Start  During the course of the patient's preoperative work up they have been evaluated comprehensively by a multidisciplinary team of specialists coordinated through the Multidisciplinary Heart Valve Clinic in the [facility].  They have been demonstrated to suffer from symptomatic severe aortic stenosis as noted above. The patient has been counseled extensively as to the relative risks and benefits of all options for the treatment of severe aortic stenosis including long term medical therapy, conventional surgery for aortic valve replacement, and transcatheter aortic valve replacement.  The patient has been independently evaluated by Dr. Sherene Dilling with CT surgery and they are felt to be at high  risk for conventional surgical aortic valve replacement. The surgeon indicated the patient would be a poor candidate for conventional surgery. Based upon review of all of the patient's preoperative diagnostic tests they are felt to be candidate for transcatheter aortic valve replacement using the transfemoral approach as an alternative to high risk conventional surgery.    Following the decision to proceed with transcatheter aortic valve replacement, a discussion has been held regarding what types of management strategies would be attempted intraoperatively in the event of life-threatening complications, including whether or not the patient would be considered a candidate for the use of cardiopulmonary bypass and/or conversion to open sternotomy for attempted surgical intervention.  The patient has been advised of a variety of complications that might develop peculiar to this approach including but not limited to risks of death, stroke, paravalvular leak, aortic dissection or other major vascular complications, aortic annulus rupture, device embolization, cardiac rupture or perforation, acute myocardial infarction, arrhythmia, heart block or bradycardia requiring permanent pacemaker placement, congestive heart failure, respiratory failure, renal failure, pneumonia, infection, other late complications related to structural valve deterioration or migration, or other complications that might ultimately cause a temporary or permanent loss of functional independence or other long term morbidity.  The patient provides full informed consent for the procedure as described and all questions were answered preoperatively.    DETAILS OF THE OPERATIVE PROCEDURE  PREPARATION:   The patient is brought to the operating room on the above mentioned date and central monitoring was established by the anesthesia team. The patient is placed in the supine position on the operating table.  Intravenous antibiotics are administered.  Conscious sedation is used.   Baseline transthoracic echocardiogram was performed. The patient's chest, abdomen, both groins, and both lower extremities are prepared and draped in a  sterile manner. A time out procedure is performed.   PERIPHERAL ACCESS:   Using the modified Seldinger technique, femoral arterial and venous access were obtained with placement of a 6 Fr sheath in the left common femoral artery and using u/s guidance.  A pigtail diagnostic catheter was passed through the femoral arterial sheath under fluoroscopic guidance into the aortic root.  Aortic root angiography was performed in order to determine the optimal angiographic angle for valve deployment.  TRANSFEMORAL ACCESS:  A micropuncture kit was used to gain access to the right common femoral artery using u/s guidance. Position confirmed with angiography. Pre-closure with double ProGlide closure devices. The patient was heparinized systemically and ACT verified > 250 seconds.    A 14 Fr transfemoral E-sheath was introduced into the right common femoral artery after progressively dilating over an Amplatz superstiff wire. An AL-1 catheter was used to direct a straight-tip exchange length wire across the native aortic valve into the left ventricle. This was exchanged out for a pigtail catheter and position was confirmed in the LV apex. Simultaneous left ventricular, aortic, and left ventricular end-diastolic pressures were recorded.  The pigtail catheter was then exchanged for an Safari wire in the LV apex.  Direct LV pacing thresholds were assessed and found to be adequate.   TRANSCATHETER HEART VALVE DEPLOYMENT:  An Edwards Sapien 3 THV (size 26 mm) was prepared and crimped per manufacturer's guidelines, and the proper orientation of the valve is confirmed on the [employer] delivery system. The valve was advanced through the introducer sheath using normal technique until in an appropriate position in the abdominal aorta beyond  the sheath tip. The balloon was then retracted and using the fine-tuning wheel was centered on the valve. The valve was then advanced across the aortic arch using appropriate flexion of the catheter. The valve was carefully positioned across the aortic valve annulus. The Commander catheter was retracted using normal technique. Once final position of the valve has been confirmed by angiographic assessment, the valve is deployed while temporarily holding ventilation and during rapid ventricular pacing to maintain systolic blood pressure < 50 mmHg and pulse pressure < 10 mmHg. The balloon inflation is held for >3 seconds after reaching full deployment volume. Once the balloon has fully deflated the balloon is retracted into the ascending aorta and valve function is assessed using TTE. There is felt to be no paravalvular leak and no central aortic insufficiency.  The patient's hemodynamic recovery following valve deployment is good.  The deployment balloon and guidewire are both removed. Echo demostrated acceptable post-procedural gradients, stable mitral valve function, and no AI.   PROCEDURE COMPLETION:  The sheath was then removed and closure devices were completed. Protamine was administered once femoral arterial repair was complete. The temporary pacemaker, pigtail catheters and femoral sheaths were removed; the left femoral arterial sheath will be managed with manual pressure.  The patient tolerated the procedure well and is transported to the surgical intensive care in stable condition. There were no immediate intraoperative complications. All sponge instrument and needle counts are verified correct at completion of the operation.   No blood products were administered during the operation.  The patient received a total of 70 mL of intravenous contrast during the procedure.  Appollonia Klee K Beren Yniguez MD 07/17/2023 3:40 PM

## 2023-07-17 NOTE — Progress Notes (Signed)
 Patient awake, neurologically intact. BP soft. Jeronimo Moors, PA  at bedside.

## 2023-07-17 NOTE — Progress Notes (Signed)
 Patient arrived to 4E18 from cathlab post TAVR w/Dr. Lorie Rook; INAD; rt and left groin sites level 0. Patient A&O x4, c/o no pain.  Telemetry monitor applied and CCMD notified.  Patient oriented to unit and room including call light and phone.  All needs addressed.

## 2023-07-17 NOTE — Discharge Instructions (Signed)

## 2023-07-17 NOTE — Anesthesia Postprocedure Evaluation (Signed)
 Anesthesia Post Note  Patient: Bryce Maldonado  Procedure(s) Performed: Transcatheter Aortic Valve Replacement, Transfemoral (Right) ECHOCARDIOGRAM, TRANSTHORACIC     Patient location during evaluation: Cath Lab Anesthesia Type: MAC Level of consciousness: oriented, patient cooperative and awake and alert Pain management: pain level controlled Vital Signs Assessment: post-procedure vital signs reviewed and stable Respiratory status: spontaneous breathing, nonlabored ventilation, respiratory function stable and patient connected to nasal cannula oxygen Cardiovascular status: stable Postop Assessment: no apparent nausea or vomiting Anesthetic complications: no  There were no known notable events for this encounter.  Last Vitals:  Vitals:   07/17/23 1655 07/17/23 1704  BP: (!) 92/56 (!) 97/55  Pulse: (!) 49 (!) 50  Resp: 15 15  Temp:  (!) 36.4 C  SpO2: 93% 95%    Last Pain:  Vitals:   07/17/23 1704  TempSrc: Oral  PainSc: 0-No pain                 Bishop Vanderwerf,E. Laurissa Cowper

## 2023-07-17 NOTE — Interval H&P Note (Signed)
 History and Physical Interval Note:  07/17/2023 1:16 PM  Bryce Maldonado  has presented today for surgery, with the diagnosis of Severe Aortic Stenosis.  The various methods of treatment have been discussed with the patient and family. After consideration of risks, benefits and other options for treatment, the patient has consented to  Procedure(s): Transcatheter Aortic Valve Replacement, Transfemoral (Right) ECHOCARDIOGRAM, TRANSTHORACIC (N/A) as a surgical intervention.  The patient's history has been reviewed, patient examined, no change in status, stable for surgery.  I have reviewed the patient's chart and labs.  Questions were answered to the patient's satisfaction.     Jeffrie Stander K Stasha Naraine

## 2023-07-17 NOTE — Progress Notes (Signed)
 Site area: L femoral (arterial) Site Prior to Removal:  Level 0 Pressure Applied For: 30 min Manual:   yes Patient Status During Pull:  stable Post Pull Site:  Level 0 Post Pull Instructions Given:  yes Post Pull Pulses Present: DP +1 bilaterally Dressing Applied:  gauze & tegaderm Bedrest begins @ 1635 Comments:

## 2023-07-18 ENCOUNTER — Other Ambulatory Visit: Payer: Self-pay

## 2023-07-18 ENCOUNTER — Inpatient Hospital Stay (HOSPITAL_COMMUNITY)

## 2023-07-18 ENCOUNTER — Telehealth: Payer: Self-pay | Admitting: Cardiology

## 2023-07-18 ENCOUNTER — Inpatient Hospital Stay (HOSPITAL_COMMUNITY)
Admit: 2023-07-18 | Discharge: 2023-07-18 | Disposition: A | Discharge status: Home / Self Care | Attending: Physician Assistant

## 2023-07-18 DIAGNOSIS — Z952 Presence of prosthetic heart valve: Secondary | ICD-10-CM

## 2023-07-18 DIAGNOSIS — I5031 Acute diastolic (congestive) heart failure: Secondary | ICD-10-CM | POA: Diagnosis present

## 2023-07-18 DIAGNOSIS — I447 Left bundle-branch block, unspecified: Secondary | ICD-10-CM

## 2023-07-18 LAB — ECHOCARDIOGRAM COMPLETE
AR max vel: 2.19 cm2
AV Area VTI: 2.03 cm2
AV Area mean vel: 2.17 cm2
AV Mean grad: 6 mmHg
AV Peak grad: 11.4 mmHg
Ao pk vel: 1.69 m/s
Area-P 1/2: 1.93 cm2
Calc EF: 55 %
Height: 71 in
MV VTI: 0.93 cm2
S' Lateral: 3.2 cm
Single Plane A2C EF: 55.2 %
Single Plane A4C EF: 54.3 %
Weight: 3075.86 [oz_av]

## 2023-07-18 LAB — CBC
HCT: 41.3 % (ref 39.0–52.0)
Hemoglobin: 13.7 g/dL (ref 13.0–17.0)
MCH: 31.3 pg (ref 26.0–34.0)
MCHC: 33.2 g/dL (ref 30.0–36.0)
MCV: 94.3 fL (ref 80.0–100.0)
Platelets: 217 10*3/uL (ref 150–400)
RBC: 4.38 MIL/uL (ref 4.22–5.81)
RDW: 13.8 % (ref 11.5–15.5)
WBC: 8.9 10*3/uL (ref 4.0–10.5)
nRBC: 0 % (ref 0.0–0.2)

## 2023-07-18 LAB — BASIC METABOLIC PANEL WITH GFR
Anion gap: 8 (ref 5–15)
BUN: 17 mg/dL (ref 8–23)
CO2: 24 mmol/L (ref 22–32)
Calcium: 9 mg/dL (ref 8.9–10.3)
Chloride: 106 mmol/L (ref 98–111)
Creatinine, Ser: 0.78 mg/dL (ref 0.61–1.24)
GFR, Estimated: >60 mL/min (ref >60)
Glucose, Bld: 92 mg/dL (ref 70–99)
Potassium: 3.9 mmol/L (ref 3.5–5.1)
Sodium: 138 mmol/L (ref 135–145)

## 2023-07-18 LAB — GLUCOSE, CAPILLARY: Glucose-Capillary: 140 mg/dL — ABNORMAL HIGH (ref 70–99)

## 2023-07-18 LAB — MAGNESIUM: Magnesium: 2.1 mg/dL (ref 1.7–2.4)

## 2023-07-18 MED ORDER — POTASSIUM CHLORIDE CRYS ER 20 MEQ PO TBCR
20.0000 meq | EXTENDED_RELEASE_TABLET | Freq: Once | ORAL | Status: AC
  Administered 2023-07-18: 20 meq via ORAL
  Filled 2023-07-18: qty 1

## 2023-07-18 MED ORDER — FUROSEMIDE 10 MG/ML IJ SOLN
40.0000 mg | Freq: Once | INTRAMUSCULAR | Status: AC
  Administered 2023-07-18: 40 mg via INTRAVENOUS
  Filled 2023-07-18: qty 4

## 2023-07-18 NOTE — Telephone Encounter (Signed)
 Dr.Jordan was made aware.Continue same medications.

## 2023-07-18 NOTE — Progress Notes (Addendum)
 Patient amb. In hall with walker H.R.up to 130-140's w/ PVC's At fib. Back to bed heart rate 80-90 w/ PVC's . Reden RN aware

## 2023-07-18 NOTE — Telephone Encounter (Addendum)
   Cardiac Monitor Alert  Date of alert:  07/18/2023   Patient Name: Bryce Maldonado  DOB: 01-16-1941  MRN: [id number]   [facility] Cardiologist: Peter Swaziland, MD  [facility] EP:  None    Monitor Information: Long Term Monitor-Live Telemetry [ZioAT]  Reason:   Copy/pasted from d/c Summary note from 07/18/2023: New LBBB: -- Noted after TAVR.  -- Discharge with a Zio AT to rule out delayed HAVB.   Ordering provider:  Ardia Kraft, PA-C    Alert Per Diego Foy at [place], this is the First Documentation of AFIB; HR 76-100 bpm, this was over a 90 second strip, occurred on 07/18/2023 at 11:11 am.  Atrial Fibrillation/Flutter This is the 1st alert for this rhythm.  The patient has a hx of Atrial Fibrillation/Flutter.  The patient is currently on anticoagulation.   Anticoagulation medication as of 07/18/2023           ELIQUIS  5 MG TABS tablet TAKE 1 TABLET(5 MG) BY MOUTH TWICE DAILY       Next Cardiology Appointment   Date:  07/23/2023  Provider:  Ardia Kraft, PA-C   The patient could NOT be reached by telephone today.  Left Voicemail at both Home and Mobile numbers.    Strip not available on Onbase (not able to upload).  Dr. Swaziland aware: PLAN is to continue current medications, no changes.   Clancy Crimes [place], [place]  07/18/2023 1:40 PM

## 2023-07-18 NOTE — Progress Notes (Signed)
   07/18/23 1121  TOC Brief Assessment  Insurance and Status Reviewed  Patient has primary care physician Yes  Home environment has been reviewed home w/ spouse  Prior level of function: self  Prior/Current Home Services No current home services  Social Drivers of Health Review SDOH reviewed no interventions necessary  Readmission risk has been reviewed Yes  Transition of care needs no transition of care needs at this time    Pt s/p TAVR, stable for transition home today, no HH or DME needs noted. Family to transport home.

## 2023-07-18 NOTE — Telephone Encounter (Signed)
 iRhythm calling to report urgent results.

## 2023-07-18 NOTE — Progress Notes (Signed)
 Mobility Specialist Progress Note:   07/18/23 1006  Mobility  Activity Ambulated with assistance in room;Ambulated with assistance in hallway  Level of Assistance Standby assist, set-up cues, supervision of patient - no hands on  Assistive Device Front wheel walker  Distance Ambulated (ft) 150 ft  Activity Response Tolerated well  Mobility Referral Yes  Mobility visit 1 Mobility  Mobility Specialist Start Time (ACUTE ONLY) 1000  Mobility Specialist Stop Time (ACUTE ONLY) 1007  Mobility Specialist Time Calculation (min) (ACUTE ONLY) 7 min   Pt received in bed, agreeable to mobility session. Ambulated in hallway with RW, SV for safety. Tolerated well, denies CP and dizziness. Max HR 147 bpm. Returned pt to room, lying comfortably in bed with all needs met.   [place] Mobility Specialist Please contact via Special educational needs teacher or  Rehab office at [phone]

## 2023-07-18 NOTE — Progress Notes (Signed)
 Discussed with pt restrictions, walking for exercise, and CRPII. Pt receptive, not interested in CRPII. [phone] German Koller BS, ACSM-CEP 07/18/2023 10:42 AM

## 2023-07-18 NOTE — Progress Notes (Signed)
 ZIO AT applied at hospital. Dr. Swaziland to read.

## 2023-07-19 ENCOUNTER — Telehealth: Payer: Self-pay | Admitting: Physician Assistant

## 2023-07-19 ENCOUNTER — Telehealth: Payer: Self-pay | Admitting: *Deleted

## 2023-07-19 NOTE — Telephone Encounter (Signed)
 Left VM to call back

## 2023-07-19 NOTE — Transitions of Care (Post Inpatient/ED Visit) (Signed)
   07/19/2023  Name: LAEL PILCH MRN: [id number] DOB: 28-Nov-1940  Today's TOC FU Call Status: Today's TOC FU Call Status:: Unsuccessful Call (1st Attempt) Unsuccessful Call (1st Attempt) Date: 07/19/23  Attempted to reach the patient regarding the most recent Inpatient visit.  Left HIPAA compliant voice message requesting call back   Follow Up Plan: Additional outreach attempts will be made to reach the patient to complete the Transitions of Care (Post Inpatient/ED visit) call.   Pls call/ message for questions,  Dacari Beckstrand Mckinney Meliya Mcconahy, RN, BSN, CCRN Alumnus RN Care Manager  Transitions of Care  [facility] - [facility] [phone]: direct office

## 2023-07-20 ENCOUNTER — Telehealth: Payer: Self-pay | Admitting: *Deleted

## 2023-07-20 NOTE — Transitions of Care (Post Inpatient/ED Visit) (Signed)
   07/20/2023  Name: Bryce Maldonado MRN: [id number] DOB: 1940-06-04  Today's TOC FU Call Status: Today's TOC FU Call Status:: Unsuccessful Call (2nd Attempt) Unsuccessful Call (2nd Attempt) Date: 07/20/23  Attempted to reach the patient regarding the most recent Inpatient visit.  Left HIPAA compliant voice message requesting call back   Follow Up Plan: Additional outreach attempts will be made to reach the patient to complete the Transitions of Care (Post Inpatient/ED visit) call.   Pls call/ message for questions,  Kemper Heupel Mckinney Deah Ottaway, RN, BSN, CCRN Alumnus RN Care Manager  Transitions of Care  [facility] - [facility] [phone]: direct office

## 2023-07-23 ENCOUNTER — Telehealth: Payer: Self-pay | Admitting: *Deleted

## 2023-07-23 ENCOUNTER — Encounter: Payer: Self-pay | Admitting: Physician Assistant

## 2023-07-23 ENCOUNTER — Ambulatory Visit: Attending: Physician Assistant | Admitting: Physician Assistant

## 2023-07-23 VITALS — BP 120/64 | HR 56 | Ht 71.0 in | Wt 192.0 lb

## 2023-07-23 DIAGNOSIS — I4819 Other persistent atrial fibrillation: Secondary | ICD-10-CM | POA: Diagnosis not present

## 2023-07-23 DIAGNOSIS — I5032 Chronic diastolic (congestive) heart failure: Secondary | ICD-10-CM

## 2023-07-23 DIAGNOSIS — I342 Nonrheumatic mitral (valve) stenosis: Secondary | ICD-10-CM

## 2023-07-23 DIAGNOSIS — I10 Essential (primary) hypertension: Secondary | ICD-10-CM

## 2023-07-23 DIAGNOSIS — I447 Left bundle-branch block, unspecified: Secondary | ICD-10-CM

## 2023-07-23 DIAGNOSIS — Z952 Presence of prosthetic heart valve: Secondary | ICD-10-CM

## 2023-07-23 NOTE — Patient Instructions (Signed)
 Medication Instructions:  Your physician recommends that you continue on your current medications as directed. Please refer to the Current Medication list given to you today.  *If you need a refill on your cardiac medications before your next appointment, please call your pharmacy*  Lab Work: None If you have labs (blood work) drawn today and your tests are completely normal, you will receive your results only by: MyChart Message (if you have MyChart) OR A paper copy in the mail If you have any lab test that is abnormal or we need to change your treatment, we will call you to review the results.  Testing/Procedures: None  Follow-Up: At [facility], you and your health needs are our priority.  As part of our continuing mission to provide you with exceptional heart care, our providers are all part of one team.  This team includes your primary Cardiologist (physician) and Advanced Practice Providers or APPs (Physician Assistants and Nurse Practitioners) who all work together to provide you with the care you need, when you need it.  Your next appointment:   Keep as scheduled: 09/06/23  Provider:   Dr. Peter Swaziland   We recommend signing up for the patient portal called MyChart.  Sign up information is provided on this After Visit Summary.  MyChart is used to connect with patients for Virtual Visits (Telemedicine).  Patients are able to view lab/test results, encounter notes, upcoming appointments, etc.  Non-urgent messages can be sent to your provider as well.   To learn more about what you can do with MyChart, go to [email or web address].

## 2023-07-23 NOTE — Transitions of Care (Post Inpatient/ED Visit) (Signed)
   07/23/2023  Name: Bryce Maldonado MRN: [id number] DOB: [birth year]  Today's TOC FU Call Status: Today's TOC FU Call Status:: Unsuccessful Call (3rd Attempt) Unsuccessful Call (3rd Attempt) Date: 07/23/23  Attempted to reach the patient regarding the most recent Inpatient visit.  Left HIPAA compliant voice message requesting call back  Follow Up Plan: No further outreach attempts will be made at this time. We have been unable to contact the patient.  Pls call/ message for questions,  [name] [name], RN, BSN, CCRN Alumnus RN Care Manager  Transitions of Care  [facility] - [facility] [phone]: direct office

## 2023-07-23 NOTE — Progress Notes (Signed)
 [facility]   MULTIDISCIPLINARY HEART VALVE CLINIC                                     Cardiology Office Note:    Date:  07/23/2023   ID:  Bryce Maldonado, DOB Jun 03, 1940, MRN [id number]  PCP:  Geofm Glade PARAS, MD  [facility] Cardiologist:  Peter Swaziland, MD  [facility] Structural heart: Lurena MARLA Red, MD [facility] Electrophysiologist:  None   Referring MD: Geofm Glade PARAS, MD   [facility] s/p TAVR  History of Present Illness:    Bryce Maldonado is a 83 y.o. male with a hx of HTN, HLD, DMT2, CKD stage II, persistent atrial fib s/p ablation on Eliquis , CAD s/p CABG (LIMA to LAD, vein graft to OM, vein graft to PDA in 1995), moderate MS, and severe AS s/p TAVR (07/17/23) who presents to clinic for follow up.  He developed progressive exertional fatigue and shortness of breath. Echo 04/12/2023 showed EF 65%, mod LVH, severe AS, mean grad 35 mmHg, 3.65m/s, AVA 0.63 cm2, as well as mod MS with mean gradient of 11 mm hg. Cath 04/24/23 showed severe three-vessel coronary disease with 4/4 patent bypass grafts, moderate pulmonary hypertension and elevated wedge pressure at 35 mmhg. S/p successful TAVR with a 26 mm Edwards Sapien 3 Ultra Resilia  THV via the TF approach on 07/17/23. Post operative echo showed EF 55%, normally functioning TAVR with a mean gradient of 6 mmHg and no PVL as well as mod MS. LVEDP elevated to 31 mmhg at the time of TAVR and he was treated with IV lasix . He developed a new LBBB after TAVR and discharged with a Zio AT.  Today the patient presents to clinic for follow up. Here with his wife. No CP or SOB. No LE edema, orthopnea or PND. No dizziness or syncope. No blood in stool or urine. No palpitations. He really dislikes wearing the Zio patch and would like it removed.      Past Medical History:  Diagnosis Date   Anemia    Atrial fibrillation (HCC)    pt has had an ablation   BPH (benign prostatic hyperplasia)    Elevated PSA   Coronary artery  disease    post CABG x4 -- in 1995 -- due to three-vessel coronary artery disease   Diverticular disease    Dyslipidemia    First degree AV block    Gout    Hypertension    Osteoarthritis    everywhere (12/04/2014)   Prostate cancer (HCC) 01/30/2010   Dr Ceil   S/P CABG (coronary artery bypass graft)    S/P TAVR (transcatheter aortic valve replacement) 07/17/2023   s/p TAVR with a 26 mm Edwards S3UR via the TF approach by Dr. Red and Dr. Lucas   Severe aortic stenosis    Spinal stenosis 01/30/2002   L4 radiculopathy, herniated nucleus pulposus L4-5   Tinnitus    Type II diabetes mellitus (HCC)    Typical atrial flutter (HCC)    s/p ablation     Current Medications: Current Meds  Medication Sig   allopurinol  (ZYLOPRIM ) 300 MG tablet TAKE 1 TABLET(300 MG) BY MOUTH DAILY   ELIQUIS  5 MG TABS tablet TAKE 1 TABLET(5 MG) BY MOUTH TWICE DAILY   ezetimibe  (ZETIA ) 10 MG tablet TAKE 1 TABLET(10 MG) BY MOUTH AT BEDTIME   glimepiride  (AMARYL ) 2 MG tablet  TAKE 1 TABLET(2 MG) BY MOUTH DAILY BEFORE BREAKFAST   JARDIANCE  25 MG TABS tablet TAKE 1 TABLET(25 MG) BY MOUTH DAILY   losartan  (COZAAR ) 25 MG tablet TAKE 1 TABLET(25 MG) BY MOUTH DAILY   Melatonin 10 MG TABS Take 10 mg by mouth at bedtime.   metFORMIN  (GLUCOPHAGE ) 500 MG tablet TAKE 2 TABLETS(1000 MG) BY MOUTH TWICE DAILY WITH A MEAL   Multiple Vitamins-Minerals (CENTRUM PO) Take 1 tablet by mouth daily. Silver   OVER THE COUNTER MEDICATION Take 3 capsules by mouth daily. Balance of Nature Fruits and Veggies   Polyethyl Glyc-Propyl Glyc PF (SYSTANE HYDRATION PF) 0.4-0.3 % SOLN Place 1 drop into both eyes daily as needed (Dry eyes).   rosuvastatin  (CRESTOR ) 10 MG tablet TAKE 1 TABLET(10 MG) BY MOUTH AT BEDTIME   sodium chloride  (OCEAN) 0.65 % SOLN nasal spray Place 1 spray into both nostrils at bedtime as needed for congestion.   venlafaxine  XR (EFFEXOR -XR) 37.5 MG 24 hr capsule TAKE 1 CAPSULE(37.5 MG) BY MOUTH DAILY WITH  BREAKFAST      ROS:   Please see the history of present illness.    All other systems reviewed and are negative.  EKGs   EKG Interpretation Date/Time:  Monday July 23 2023 10:08:04 EDT Ventricular Rate:  56 PR Interval:    QRS Duration:  138 QT Interval:  462 QTC Calculation: 445 R Axis:   41  Text Interpretation: Atrial fibrillation with slow ventricular response with a competing junctional pacemaker Left bundle branch block When compared with ECG of 18-Jul-2023 03:13, No significant change was found Confirmed by Sebastian Collar [phone]) on 07/23/2023 10:25:08 AM   Risk Assessment/Calculations:    CHA2DS2-VASc Score =     This indicates a  % annual risk of stroke. The patient's score is based upon:           Physical Exam:    VS:  BP 120/64 (BP Location: Left Arm, Patient Position: Sitting, Cuff Size: Normal)   Pulse (!) 56   Ht 5' 11 (1.803 m)   Wt 192 lb (87.1 kg)   SpO2 97%   BMI 26.78 kg/m     Wt Readings from Last 3 Encounters:  07/23/23 192 lb (87.1 kg)  07/18/23 192 lb 3.9 oz (87.2 kg)  07/04/23 190 lb (86.2 kg)     GEN: Well nourished, well developed in no acute distress NECK: No JVD CARDIAC: irreg irreg, no murmurs, rubs, gallops RESPIRATORY:  Clear to auscultation without rales, wheezing or rhonchi  ABDOMEN: Soft, non-tender, non-distended EXTREMITIES:  No edema; No deformity.  Groin sites clear without hematoma. Mild bilateral ecchymosis noted.   ASSESSMENT:    1. S/P TAVR (transcatheter aortic valve replacement)   2. LBBB (left bundle branch block)   3. Persistent atrial fibrillation (HCC)   4. Essential hypertension   5. Chronic heart failure with preserved ejection fraction (HCC)   6. Nonrheumatic mitral valve stenosis     PLAN:    In order of problems listed above:  Severe AS s/p TAVR:  -- Pt doing excellent s/p TAVR.  -- ECG with no HAVB.  -- Groin sites healing well.  -- SBE discussed. He gets amoxicillin  from the dentist.   -- Continue Aspirin  81mg  daily. -- Cleared to resume all activities without restriction. -- He will be seen back for 1 month echo and OV on 8/7 with Dr. Swaziland (previously scheduled apt).  New LBBB: -- Noted after TAVR.  -- Discharge with a Zio AT  to rule out delayed HAVB.  -- This was removed in the office today due to irritation.    Persistent atrial fibrillation: -- Rate controlled without AV nodal blocking agents.  -- Continue Eliquis  5mg  BID.    HFpEF: -- Appears euvolemic.  -- Continue Jardiance  25 mg daily.    HTN: -- BP well controlled. -- Continue losartan  25 mg daily.     Mitral stenosis: -- Not a valve surgery candidate.  -- Continue medical therapy.    Medication Adjustments/Labs and Tests Ordered: Current medicines are reviewed at length with the patient today.  Concerns regarding medicines are outlined above.  Orders Placed This Encounter  Procedures   EKG 12-Lead   No orders of the defined types were placed in this encounter.   Patient Instructions  Medication Instructions:  Your physician recommends that you continue on your current medications as directed. Please refer to the Current Medication list given to you today.  *If you need a refill on your cardiac medications before your next appointment, please call your pharmacy*  Lab Work: None If you have labs (blood work) drawn today and your tests are completely normal, you will receive your results only by: MyChart Message (if you have MyChart) OR A paper copy in the mail If you have any lab test that is abnormal or we need to change your treatment, we will call you to review the results.  Testing/Procedures: None  Follow-Up: At [facility], you and your health needs are our priority.  As part of our continuing mission to provide you with exceptional heart care, our providers are all part of one team.  This team includes your primary Cardiologist (physician) and Advanced Practice  Providers or APPs (Physician Assistants and Nurse Practitioners) who all work together to provide you with the care you need, when you need it.  Your next appointment:   Keep as scheduled: 09/06/23  Provider:   Dr. Peter Swaziland   We recommend signing up for the patient portal called MyChart.  Sign up information is provided on this After Visit Summary.  MyChart is used to connect with patients for Virtual Visits (Telemedicine).  Patients are able to view lab/test results, encounter notes, upcoming appointments, etc.  Non-urgent messages can be sent to your provider as well.   To learn more about what you can do with MyChart, go to [email or web address].         Signed, Lamarr Hummer, PA-C  07/23/2023 10:39 AM    [facility]

## 2023-07-25 ENCOUNTER — Telehealth: Payer: Self-pay | Admitting: Cardiology

## 2023-07-25 NOTE — Telephone Encounter (Signed)
 Spoke with patient and he states when he came to his appointment on 6/23 the provider took off his monitor and stated she will send it back

## 2023-07-25 NOTE — Telephone Encounter (Signed)
 Calling to let us  know that they haven't been able to connect with the patient monitor. They have tried reaching out but unable to get them. Wanted to make our office aware, they they haven't gotten anything in 24 hrs. Please advise

## 2023-08-06 NOTE — Addendum Note (Signed)
 Encounter addended by: Caden Fatica N on: 08/06/2023 10:36 AM  Actions taken: Imaging Exam ended

## 2023-08-07 ENCOUNTER — Ambulatory Visit: Payer: Self-pay | Admitting: Internal Medicine

## 2023-08-07 DIAGNOSIS — Z952 Presence of prosthetic heart valve: Secondary | ICD-10-CM

## 2023-08-07 DIAGNOSIS — I447 Left bundle-branch block, unspecified: Secondary | ICD-10-CM | POA: Diagnosis not present

## 2023-08-28 NOTE — Progress Notes (Signed)
 [address], [address] [place], [place]  [address] Phone: [phone] Fax:  [phone]  Date:  09/06/2023   ID:  Bryce Maldonado, DOB Oct 19, 1940, MRN [id number]  PCP:  Geofm Glade PARAS, MD  Cardiologist:  Dr. Chane Magner [place]     History of Present Illness: Bryce Maldonado is a 83 y.o. male is seen for evaluation of progressive aortic stenosis.   He has a hx of CAD, s/p CABG in 1995, HL, prostate CA, DJD, spinal stenosis. In 03/2010 he underwent a Lexiscan Myoview. This demonstrated an EF of 66% and inferior ischemia. LHC 01/2010: all grafts patent.  EF 60-65%. He was treated medically.   In 2016 he had new onset atrial flutter. He was anticoagulated and underwent Ablation by Dr. Kelsie on October 01, 2014. He had repeat ablation on Nov. 4 2016.  On his visit 2020 he was noted to be in atrial flutter with controlled rate. He was asymptomatic. Started on Eliquis . Treated with rate control only.   More recently noted worsening AS murmur and echo done indicating severe aortic stenosis with mean AV gradient of 35 mm Hg with valve area 0.53 cm squared. There was also severe mitral annular calcification. With moderate MS.   Cardiac cath showed patent bypass grafts with moderate pulmonary HTN, elevated PCWP 35 mm Hg. Unable to cross AV due to radial artery spasm. He has since been seen in our valve clinic. S/p successful TAVR with a 26 mm Edwards Sapien 3 Ultra Resilia  THV via the TF approach on 07/17/23. Post operative echo showed EF 55%, normally functioning TAVR with a mean gradient of 6 mmHg and no PVL as well as mod MS. LVEDP elevated to 31 mmhg at the time of TAVR and he was treated with IV lasix . He developed a new LBBB after TAVR and discharged with a Zio AT. This showed AFib with controlled rate and PVCs.   No dizziness or syncope. Denies chest pain or SOB.   Wt Readings from Last 3 Encounters:  09/06/23 190 lb (86.2 kg)  07/23/23 192 lb (87.1 kg)  07/18/23 192 lb 3.9 oz (87.2  kg)     Past Medical History:  Diagnosis Date   Anemia    Atrial fibrillation (HCC)    pt has had an ablation   BPH (benign prostatic hyperplasia)    Elevated PSA   Coronary artery disease    post CABG x4 -- in 1995 -- due to three-vessel coronary artery disease   Diverticular disease    Dyslipidemia    First degree AV block    Gout    Hypertension    Osteoarthritis    everywhere (12/04/2014)   Prostate cancer (HCC) 01/30/2010   Dr Ceil   S/P CABG (coronary artery bypass graft)    S/P TAVR (transcatheter aortic valve replacement) 07/17/2023   s/p TAVR with a 26 mm Edwards S3UR via the TF approach by Dr. Wendel and Dr. Lucas   Severe aortic stenosis    Spinal stenosis 01/30/2002   L4 radiculopathy, herniated nucleus pulposus L4-5   Tinnitus    Type II diabetes mellitus (HCC)    Typical atrial flutter (HCC)    s/p ablation    Current Outpatient Medications  Medication Sig Dispense Refill   allopurinol  (ZYLOPRIM ) 300 MG tablet TAKE 1 TABLET(300 MG) BY MOUTH DAILY 90 tablet 1   ELIQUIS  5 MG TABS tablet TAKE 1 TABLET(5 MG) BY MOUTH TWICE DAILY 180 tablet 3   ezetimibe  (ZETIA )  10 MG tablet TAKE 1 TABLET(10 MG) BY MOUTH AT BEDTIME 90 tablet 3   glimepiride  (AMARYL ) 2 MG tablet TAKE 1 TABLET(2 MG) BY MOUTH DAILY BEFORE BREAKFAST 90 tablet 1   JARDIANCE  25 MG TABS tablet TAKE 1 TABLET(25 MG) BY MOUTH DAILY 90 tablet 2   losartan  (COZAAR ) 25 MG tablet TAKE 1 TABLET(25 MG) BY MOUTH DAILY 90 tablet 1   Melatonin 10 MG TABS Take 10 mg by mouth at bedtime.     metFORMIN  (GLUCOPHAGE ) 500 MG tablet TAKE 2 TABLETS(1000 MG) BY MOUTH TWICE DAILY WITH A MEAL 360 tablet 1   Multiple Vitamins-Minerals (CENTRUM PO) Take 1 tablet by mouth daily. Silver     OVER THE COUNTER MEDICATION Take 3 capsules by mouth daily. Balance of Nature Fruits and Veggies     Polyethyl Glyc-Propyl Glyc PF (SYSTANE HYDRATION PF) 0.4-0.3 % SOLN Place 1 drop into both eyes daily as needed (Dry eyes).      rosuvastatin  (CRESTOR ) 10 MG tablet TAKE 1 TABLET(10 MG) BY MOUTH AT BEDTIME 90 tablet 3   sodium chloride  (OCEAN) 0.65 % SOLN nasal spray Place 1 spray into both nostrils at bedtime as needed for congestion.     venlafaxine  XR (EFFEXOR -XR) 37.5 MG 24 hr capsule TAKE 1 CAPSULE(37.5 MG) BY MOUTH DAILY WITH BREAKFAST 90 capsule 3   No current facility-administered medications for this visit.    Allergies:    Allergies  Allergen Reactions   Naproxen Sodium Rash    Social History:  The patient  reports that he quit smoking about 53 years ago. His smoking use included cigarettes. He started smoking about 68 years ago. He has a 37.5 pack-year smoking history. He has never used smokeless tobacco. He reports current alcohol  use of about 2.0 standard drinks of alcohol  per week. He reports that he does not use drugs.   ROS:  Please see the history of present illness.      All other systems reviewed and negative.   PHYSICAL EXAM: VS:  BP 133/73 (BP Location: Right Arm)   Pulse 86   Ht 5' 11 (1.803 m)   Wt 190 lb (86.2 kg)   SpO2 95%   BMI 26.50 kg/m  GENERAL:  Well appearing WM in NAD HEENT:  PERRL, EOMI, sclera are clear. Oropharynx is clear. NECK:  No jugular venous distention, carotid upstroke brisk and symmetric, no bruits, no thyromegaly or adenopathy LUNGS:  Clear to auscultation bilaterally CHEST:  Unremarkable HEART:  IRRR,  PMI not displaced or sustained,S1 and S2 within normal limits, no S3, no S4: no clicks, no rubs, harsh gr 3/6 systolic murmur RUSB to apex. ABD:  Soft, nontender. BS +, no masses or bruits. No hepatomegaly, no splenomegaly EXT:  2 + pulses throughout, no edema, no cyanosis no clubbing SKIN:  Warm and dry.  No rashes NEURO:  Alert and oriented x 3. Cranial nerves II through XII intact. PSYCH:  Cognitively intact    Laboratory data:  Lab Results  Component Value Date   WBC 8.9 07/18/2023   HGB 13.7 07/18/2023   HCT 41.3 07/18/2023   PLT 217 07/18/2023    GLUCOSE 92 07/18/2023   CHOL 128 07/04/2023   TRIG 79.0 07/04/2023   HDL 52.00 07/04/2023   LDLDIRECT 86.2 06/24/2010   LDLCALC 60 07/04/2023   ALT 17 07/13/2023   AST 18 07/13/2023   NA 138 07/18/2023   K 3.9 07/18/2023   CL 106 07/18/2023   CREATININE 0.78 07/18/2023   BUN  17 07/18/2023   CO2 24 07/18/2023   TSH 1.24 01/03/2023   PSA 6.12 (H) 08/06/2006   INR 1.2 07/13/2023   HGBA1C 6.6 (H) 07/04/2023         Echo 12/31/18:IMPRESSIONS     1. Left ventricular ejection fraction, by visual estimation, is 70 to  75%. The left ventricle has hyperdynamic function. There is no left  ventricular hypertrophy.   2. Left ventricular diastolic function could not be evaluated.   3. Global right ventricle has normal systolic function.The right  ventricular size is normal. No increase in right ventricular wall  thickness.   4. Left atrial size was mildly dilated.   5. Right atrial size was normal.   6. Moderate to severe aortic valve annular calcification.   7. Moderate to severe mitral annular calcification.   8. The mitral valve is grossly normal. Mild mitral valve regurgitation.   9. The tricuspid valve is normal in structure. Tricuspid valve  regurgitation is mild.  10. The aortic valve has an indeterminant number of cusps. Aortic valve  regurgitation is mild.  11. The pulmonic valve was normal in structure. Pulmonic valve  regurgitation is trivial.  12. Aortic dilatation noted.  13. There is mild dilatation of the ascending aorta.  14. Normal pulmonary artery systolic pressure.  15. The atrial septum is grossly normal.    Echo 04/12/23: IMPRESSIONS     1. Left ventricular ejection fraction, by estimation, is 65 to 70%. The  left ventricle has normal function. The left ventricle has no regional  wall motion abnormalities. There is moderate left ventricular hypertrophy.  Left ventricular diastolic function   could not be evaluated.   2. Right ventricular systolic function  is normal. The right ventricular  size is normal. There is mildly elevated pulmonary artery systolic  pressure. The estimated right ventricular systolic pressure is 38.5 mmHg.   3. Left atrial size was mildly dilated.   4. The mitral valve is degenerative and heavily calcified. Mild mitral  valve regurgitation. Moderate mitral stenosis. The mean mitral valve  gradient is 11.0 mmHg with average heart rate of 73 bpm.   5. The aortic valve is calcified. The left and right coronary cusps  appear fixed. There is moderate calcification of the aortic valve. Aortic  valve regurgitation is moderate to severe. Severe aortic valve stenosis.  Aortic valve area, by VTI measures  0.53 cm. Aortic valve mean gradient measures 35.0 mmHg. Aortic valve Vmax  measures 3.70 m/s. Peak gradient 54.8 mmHg, DI 0.23.   6. Aortic dilatation noted. There is borderline dilatation of the aortic  root, measuring 38 mm.   7. The inferior vena cava is dilated in size with >50% respiratory  variability, suggesting right atrial pressure of 8 mmHg.   Comparison(s): 12/31/2018: LVEF 70%, mild AI, moderate-severe MAC, asc aor  38mm, AOV mean 11.4, peak 23.6 mmHg.   Conclusion(s)/Recommendation(s): Recommend further evaluation of AS/AR and  MS with TEE and/or exercise testing to determine if the patient is  symptomatic.   Cardiac cath 04/24/23:  RIGHT HEART CATH AND CORONARY/GRAFT ANGIOGRAPHY   Conclusion      Mid LM to Dist LM lesion is 70% stenosed.   Prox LAD lesion is 100% stenosed.   Ost Cx to Prox Cx lesion is 95% stenosed.   Ost RCA lesion is 100% stenosed.   RPDA lesion is 100% stenosed.   Prox Graft to Mid Graft lesion is 30% stenosed.   LIMA graft was visualized by angiography and  is normal in caliber.   SVG graft was visualized by angiography and is normal in caliber.   SVG graft was visualized by angiography and is normal in caliber.   SVG graft was visualized by angiography and is normal in caliber.    The graft exhibits no disease.   The graft exhibits no disease.   The graft exhibits no disease.   Hemodynamic findings consistent with moderate pulmonary hypertension.   3 vessel occlusive CAD Patent LIMA to the LAD Patent SVG to first diagonal Patent SVG to OM Patent SVG to PDA. Left to right collaterals to the PL and mid RCA Moderate pulmonary HTN. PAP 68/28, mean 42 mm Hg Elevated PCWP 30/45, mean 35 mm Hg Cardiac output 4.22 L/min with index 2.04 Difficult crossing AV with catheter due to significant radial artery spasm.    Plan: plan heart valve team assessment     ASSESSMENT AND PLAN:  CAD: s/p CABG 1995. Now 30 years out, no significant angina.  Cardiac cath recently showed patent grafts.   Continue statin. No ASA since on Eliquis   Atrial flutter- s/p  Ablation x 2 in 2016. Recurrent Afib/flutter since 2020. Now with Afib and controlled rate. 2 week monitor showed no significant bradycardia or pauses. He is asymptomatic.  [place] vasc score of 4. Will continue Eliquis  5 mg bid.   3.  Hyperlipidemia:  At goal on statin. LDL 69   4.   DM per primary care. A1c 6.2%  5.   HTN. Well controlled.  6.  Aortic stenosis severe with some AI. S/p TAVR. I reviewed Echo done today. The prosthesis is functioning normally. No AI. Recommend SBE prophylaxis. NYHA class I sx.  7. Moderate mitral stenosis. Monitor with yearly Echo.     Signed, Kayshaun Polanco [place] MD, FACC

## 2023-09-06 ENCOUNTER — Encounter: Payer: Self-pay | Admitting: Cardiology

## 2023-09-06 ENCOUNTER — Ambulatory Visit
Admission: RE | Admit: 2023-09-06 | Discharge: 2023-09-06 | Disposition: A | Discharge status: Home / Self Care | Source: Ambulatory Visit | Attending: Cardiology | Admitting: Cardiology

## 2023-09-06 ENCOUNTER — Ambulatory Visit: Admitting: Cardiology

## 2023-09-06 VITALS — BP 133/73 | HR 86 | Ht 71.0 in | Wt 190.0 lb

## 2023-09-06 DIAGNOSIS — Z952 Presence of prosthetic heart valve: Secondary | ICD-10-CM

## 2023-09-06 DIAGNOSIS — I342 Nonrheumatic mitral (valve) stenosis: Secondary | ICD-10-CM

## 2023-09-06 DIAGNOSIS — Z951 Presence of aortocoronary bypass graft: Secondary | ICD-10-CM | POA: Insufficient documentation

## 2023-09-06 DIAGNOSIS — I447 Left bundle-branch block, unspecified: Secondary | ICD-10-CM | POA: Diagnosis not present

## 2023-09-06 DIAGNOSIS — I4819 Other persistent atrial fibrillation: Secondary | ICD-10-CM | POA: Diagnosis not present

## 2023-09-06 DIAGNOSIS — I350 Nonrheumatic aortic (valve) stenosis: Secondary | ICD-10-CM | POA: Insufficient documentation

## 2023-09-06 LAB — ECHOCARDIOGRAM COMPLETE
AV Mean grad: 8.8 mmHg
AV Peak grad: 13.3 mmHg
Ao pk vel: 1.82 m/s
Area-P 1/2: 1.34 cm2
MV M vel: 5 m/s
MV Peak grad: 100 mmHg
S' Lateral: 3.2 cm

## 2023-09-06 NOTE — Patient Instructions (Signed)
 Medication Instructions:  Continue same medications *If you need a refill on your cardiac medications before your next appointment, please call your pharmacy*  Lab Work: None ordered  Testing/Procedures: None ordered  Follow-Up: At [facility], you and your health needs are our priority.  As part of our continuing mission to provide you with exceptional heart care, our providers are all part of one team.  This team includes your primary Cardiologist (physician) and Advanced Practice Providers or APPs (Physician Assistants and Nurse Practitioners) who all work together to provide you with the care you need, when you need it.  Your next appointment:  6 months   Call in Oct to schedule Feb appointment     Provider:  Dr.Jordan   We recommend signing up for the patient portal called MyChart.  Sign up information is provided on this After Visit Summary.  MyChart is used to connect with patients for Virtual Visits (Telemedicine).  Patients are able to view lab/test results, encounter notes, upcoming appointments, etc.  Non-urgent messages can be sent to your provider as well.   To learn more about what you can do with MyChart, go to [email or web address].

## 2023-09-10 ENCOUNTER — Ambulatory Visit: Payer: Self-pay | Admitting: Physician Assistant

## 2023-10-19 ENCOUNTER — Telehealth: Payer: Self-pay | Admitting: Pharmacy Technician

## 2023-10-19 ENCOUNTER — Other Ambulatory Visit (HOSPITAL_COMMUNITY): Payer: Self-pay

## 2023-10-19 NOTE — Telephone Encounter (Signed)
   Pharmacy Patient Advocate Encounter   Received notification from Onbase that prior authorization for eliquis  is required/requested.   Insurance verification completed.   The patient is insured through [employer] .   Per test claim: PA required; PA submitted to above mentioned insurance via Latent Key/confirmation #/EOC [id number] Status is pending  Pharmacy Patient Advocate Encounter  Received notification from [employer] that Prior Authorization for eliquis  has been APPROVED from 10/19/23 to 01/30/24   PA #/Case ID/Reference #: [id number]

## 2023-10-24 ENCOUNTER — Other Ambulatory Visit: Payer: Self-pay | Admitting: Internal Medicine

## 2023-11-16 ENCOUNTER — Other Ambulatory Visit: Payer: Self-pay | Admitting: Internal Medicine

## 2023-11-19 DIAGNOSIS — H40013 Open angle with borderline findings, low risk, bilateral: Secondary | ICD-10-CM | POA: Diagnosis not present

## 2023-11-19 DIAGNOSIS — H0102B Squamous blepharitis left eye, upper and lower eyelids: Secondary | ICD-10-CM | POA: Diagnosis not present

## 2023-11-19 DIAGNOSIS — E113393 Type 2 diabetes mellitus with moderate nonproliferative diabetic retinopathy without macular edema, bilateral: Secondary | ICD-10-CM | POA: Diagnosis not present

## 2023-11-19 DIAGNOSIS — H0102A Squamous blepharitis right eye, upper and lower eyelids: Secondary | ICD-10-CM | POA: Diagnosis not present

## 2023-11-19 DIAGNOSIS — Z961 Presence of intraocular lens: Secondary | ICD-10-CM | POA: Diagnosis not present

## 2023-11-19 DIAGNOSIS — H04123 Dry eye syndrome of bilateral lacrimal glands: Secondary | ICD-10-CM | POA: Diagnosis not present

## 2023-11-19 LAB — OPHTHALMOLOGY REPORT-SCANNED

## 2023-11-21 DIAGNOSIS — I25119 Atherosclerotic heart disease of native coronary artery with unspecified angina pectoris: Secondary | ICD-10-CM | POA: Diagnosis not present

## 2023-11-21 DIAGNOSIS — D6869 Other thrombophilia: Secondary | ICD-10-CM | POA: Diagnosis not present

## 2023-11-21 DIAGNOSIS — J449 Chronic obstructive pulmonary disease, unspecified: Secondary | ICD-10-CM | POA: Diagnosis not present

## 2023-11-21 DIAGNOSIS — I272 Pulmonary hypertension, unspecified: Secondary | ICD-10-CM | POA: Diagnosis not present

## 2023-11-21 DIAGNOSIS — I4891 Unspecified atrial fibrillation: Secondary | ICD-10-CM | POA: Diagnosis not present

## 2023-11-21 DIAGNOSIS — E1151 Type 2 diabetes mellitus with diabetic peripheral angiopathy without gangrene: Secondary | ICD-10-CM | POA: Diagnosis not present

## 2023-11-21 DIAGNOSIS — I509 Heart failure, unspecified: Secondary | ICD-10-CM | POA: Diagnosis not present

## 2023-11-21 DIAGNOSIS — I130 Hypertensive heart and chronic kidney disease with heart failure and stage 1 through stage 4 chronic kidney disease, or unspecified chronic kidney disease: Secondary | ICD-10-CM | POA: Diagnosis not present

## 2023-11-21 DIAGNOSIS — I4892 Unspecified atrial flutter: Secondary | ICD-10-CM | POA: Diagnosis not present

## 2023-12-05 ENCOUNTER — Other Ambulatory Visit: Payer: Self-pay | Admitting: Internal Medicine

## 2023-12-06 ENCOUNTER — Other Ambulatory Visit: Payer: Self-pay | Admitting: Internal Medicine

## 2024-01-04 ENCOUNTER — Emergency Department (HOSPITAL_COMMUNITY)
Admission: EM | Admit: 2024-01-04 | Discharge: 2024-01-04 | Disposition: A | Discharge status: Home / Self Care | Attending: Emergency Medicine | Admitting: Emergency Medicine

## 2024-01-04 ENCOUNTER — Emergency Department (HOSPITAL_COMMUNITY)

## 2024-01-04 ENCOUNTER — Other Ambulatory Visit: Payer: Self-pay

## 2024-01-04 DIAGNOSIS — Z8546 Personal history of malignant neoplasm of prostate: Secondary | ICD-10-CM | POA: Insufficient documentation

## 2024-01-04 DIAGNOSIS — Z951 Presence of aortocoronary bypass graft: Secondary | ICD-10-CM | POA: Insufficient documentation

## 2024-01-04 DIAGNOSIS — I10 Essential (primary) hypertension: Secondary | ICD-10-CM | POA: Insufficient documentation

## 2024-01-04 DIAGNOSIS — S0990XA Unspecified injury of head, initial encounter: Secondary | ICD-10-CM | POA: Diagnosis not present

## 2024-01-04 DIAGNOSIS — Z96643 Presence of artificial hip joint, bilateral: Secondary | ICD-10-CM | POA: Insufficient documentation

## 2024-01-04 DIAGNOSIS — G319 Degenerative disease of nervous system, unspecified: Secondary | ICD-10-CM | POA: Diagnosis not present

## 2024-01-04 DIAGNOSIS — I2510 Atherosclerotic heart disease of native coronary artery without angina pectoris: Secondary | ICD-10-CM | POA: Diagnosis not present

## 2024-01-04 DIAGNOSIS — E119 Type 2 diabetes mellitus without complications: Secondary | ICD-10-CM | POA: Insufficient documentation

## 2024-01-04 DIAGNOSIS — Z79899 Other long term (current) drug therapy: Secondary | ICD-10-CM | POA: Diagnosis not present

## 2024-01-04 DIAGNOSIS — W010XXA Fall on same level from slipping, tripping and stumbling without subsequent striking against object, initial encounter: Secondary | ICD-10-CM | POA: Insufficient documentation

## 2024-01-04 DIAGNOSIS — Z7984 Long term (current) use of oral hypoglycemic drugs: Secondary | ICD-10-CM | POA: Insufficient documentation

## 2024-01-04 DIAGNOSIS — S199XXA Unspecified injury of neck, initial encounter: Secondary | ICD-10-CM | POA: Diagnosis not present

## 2024-01-04 DIAGNOSIS — Z7901 Long term (current) use of anticoagulants: Secondary | ICD-10-CM | POA: Insufficient documentation

## 2024-01-04 DIAGNOSIS — Z87891 Personal history of nicotine dependence: Secondary | ICD-10-CM | POA: Insufficient documentation

## 2024-01-04 DIAGNOSIS — W19XXXA Unspecified fall, initial encounter: Secondary | ICD-10-CM | POA: Diagnosis not present

## 2024-01-04 LAB — COMPREHENSIVE METABOLIC PANEL WITH GFR
ALT: 16 U/L (ref 0–44)
AST: 21 U/L (ref 15–41)
Albumin: 3.8 g/dL (ref 3.5–5.0)
Alkaline Phosphatase: 57 U/L (ref 38–126)
Anion gap: 11 (ref 5–15)
BUN: 13 mg/dL (ref 8–23)
CO2: 24 mmol/L (ref 22–32)
Calcium: 9.4 mg/dL (ref 8.9–10.3)
Chloride: 103 mmol/L (ref 98–111)
Creatinine, Ser: 0.82 mg/dL (ref 0.61–1.24)
GFR, Estimated: >60 mL/min (ref >60)
Glucose, Bld: 207 mg/dL — ABNORMAL HIGH (ref 70–99)
Potassium: 4 mmol/L (ref 3.5–5.1)
Sodium: 138 mmol/L (ref 135–145)
Total Bilirubin: 0.9 mg/dL (ref 0.0–1.2)
Total Protein: 7 g/dL (ref 6.5–8.1)

## 2024-01-04 LAB — CBC
HCT: 43.5 % (ref 39.0–52.0)
Hemoglobin: 14.1 g/dL (ref 13.0–17.0)
MCH: 29.9 pg (ref 26.0–34.0)
MCHC: 32.4 g/dL (ref 30.0–36.0)
MCV: 92.2 fL (ref 80.0–100.0)
Platelets: 253 K/uL (ref 150–400)
RBC: 4.72 MIL/uL (ref 4.22–5.81)
RDW: 14.6 % (ref 11.5–15.5)
WBC: 6.1 K/uL (ref 4.0–10.5)
nRBC: 0 % (ref 0.0–0.2)

## 2024-01-04 LAB — URINALYSIS, ROUTINE W REFLEX MICROSCOPIC
Bacteria, UA: NONE SEEN
Bilirubin Urine: NEGATIVE
Glucose, UA: >=500 mg/dL — AB
Hgb urine dipstick: NEGATIVE
Ketones, ur: NEGATIVE mg/dL
Leukocytes,Ua: NEGATIVE
Nitrite: NEGATIVE
Protein, ur: NEGATIVE mg/dL
Specific Gravity, Urine: 1.027 (ref 1.005–1.030)
pH: 5 (ref 5.0–8.0)

## 2024-01-04 LAB — I-STAT CHEM 8, ED
BUN: 15 mg/dL (ref 8–23)
Calcium, Ion: 1.16 mmol/L (ref 1.15–1.40)
Chloride: 103 mmol/L (ref 98–111)
Creatinine, Ser: 0.8 mg/dL (ref 0.61–1.24)
Glucose, Bld: 202 mg/dL — ABNORMAL HIGH (ref 70–99)
HCT: 43 % (ref 39.0–52.0)
Hemoglobin: 14.6 g/dL (ref 13.0–17.0)
Potassium: 3.9 mmol/L (ref 3.5–5.1)
Sodium: 140 mmol/L (ref 135–145)
TCO2: 23 mmol/L (ref 22–32)

## 2024-01-04 LAB — SAMPLE TO BLOOD BANK

## 2024-01-04 LAB — PROTIME-INR
INR: 1 (ref 0.8–1.2)
Prothrombin Time: 14.2 s (ref 11.4–15.2)

## 2024-01-04 LAB — I-STAT CG4 LACTIC ACID, ED: Lactic Acid, Venous: 3.4 mmol/L (ref 0.5–1.9)

## 2024-01-04 LAB — ETHANOL: Alcohol, Ethyl (B): <15 mg/dL (ref <15)

## 2024-01-04 NOTE — Progress Notes (Signed)
 Orthopedic Tech Progress Note Patient Details:  Bryce Maldonado March 02, 1940 [id number]  Patient ID: Bryce Maldonado Bryce Maldonado, male   DOB: 01-19-1941, 83 y.o.   MRN: [id number] Responded to Level 2 trauma ortho tech not needed. Bryce Maldonado Bryce Maldonado 01/04/2024, 3:41 PM

## 2024-01-04 NOTE — ED Notes (Signed)
 Pt ambulated to restroom with RN, unsteady while ambulating

## 2024-01-04 NOTE — ED Triage Notes (Signed)
 Pt bib [facility] from home for a fall on thinners. Pt was walking outside and fell backwards and hit head on concrete. No loc, no neck or back pain. Pt takes elqiuis for blood clots. No c/o of pain

## 2024-01-04 NOTE — ED Provider Notes (Signed)
 [place] EMERGENCY DEPARTMENT AT [facility] Provider Note  CSN: [id number] Arrival date & time: 01/04/24 1531  Chief Complaint(s) Fall  HPI Bryce Maldonado is a 83 y.o. male with past medical history as below, significant for afib on eliquis , BPH, CAD, s/p cabg, s/p tavr who presents to the ED with complaint of FOT  Pt reports he had a trip and fall just pta. Fell backwards and hit his head, no loc. No other injuries reported. Did not try to ambulate after the event. No current discomfort. No dyspnea, no n/v, no sig ha. Last dose of eliquis  was this AM (afib)  Level 2 trauma   Past Medical History Past Medical History:  Diagnosis Date   Anemia    Atrial fibrillation (HCC)    pt has had an ablation   BPH (benign prostatic hyperplasia)    Elevated PSA   Coronary artery disease    post CABG x4 -- in 1995 -- due to three-vessel coronary artery disease   Diverticular disease    Dyslipidemia    First degree AV block    Gout    Hypertension    Osteoarthritis    everywhere (12/04/2014)   Prostate cancer (HCC) 01/30/2010   Dr Ceil   S/P CABG (coronary artery bypass graft)    S/P TAVR (transcatheter aortic valve replacement) 07/17/2023   s/p TAVR with a 26 mm Edwards S3UR via the TF approach by Dr. Wendel and Dr. Lucas   Severe aortic stenosis    Spinal stenosis 01/30/2002   L4 radiculopathy, herniated nucleus pulposus L4-5   Tinnitus    Type II diabetes mellitus (HCC)    Typical atrial flutter [facility])    s/p ablation   Patient Active Problem List   Diagnosis Date Noted   Acute heart failure with preserved ejection fraction (HFpEF) (HCC) 07/18/2023   S/P TAVR (transcatheter aortic valve replacement) 07/17/2023   S/P CABG (coronary artery bypass graft)    Diabetic neuropathy (HCC) 07/26/2022   Chronic pain of both feet 06/20/2022   Spinal stenosis of lumbar region with neurogenic claudication 08/03/2021   Chronic low back pain 06/01/2021   Scalp pruritus  12/01/2020   Toe pain 04/03/2018   Severe aortic stenosis 10/02/2017   Hypertension 09/27/2016   Anxiety 03/27/2016   Atrial fibrillation (HCC) 12/04/2014   Typical atrial flutter (HCC) 11/03/2014   PAF (paroxysmal atrial fibrillation) (HCC) 08/13/2014   Type II diabetes mellitus (HCC) 04/22/2013   Gout 03/04/2009   PROSTATE CANCER, HX OF 03/04/2009   Mixed hyperlipidemia 08/06/2006   Home Medication(s) Prior to Admission medications   Medication Sig Start Date End Date Taking? Authorizing Provider  allopurinol  (ZYLOPRIM ) 300 MG tablet TAKE 1 TABLET(300 MG) BY MOUTH DAILY 07/04/23   Geofm Glade PARAS, MD  ELIQUIS  5 MG TABS tablet TAKE 1 TABLET(5 MG) BY MOUTH TWICE DAILY 01/26/23   Jordan, Peter M, MD  ezetimibe  (ZETIA ) 10 MG tablet TAKE 1 TABLET(10 MG) BY MOUTH AT BEDTIME 05/08/23   Thukkani, Arun K, MD  glimepiride  (AMARYL ) 2 MG tablet TAKE 1 TABLET(2 MG) BY MOUTH DAILY BEFORE BREAKFAST 12/05/23   Burns, Glade PARAS, MD  JARDIANCE  25 MG TABS tablet TAKE 1 TABLET(25 MG) BY MOUTH DAILY 11/16/23   Geofm Glade PARAS, MD  losartan  (COZAAR ) 25 MG tablet TAKE 1 TABLET(25 MG) BY MOUTH DAILY 07/04/23   Geofm Glade PARAS, MD  Melatonin 10 MG TABS Take 10 mg by mouth at bedtime.    [provider]  metFORMIN  (  GLUCOPHAGE ) 500 MG tablet TAKE 2 TABLETS(1000 MG) BY MOUTH TWICE DAILY WITH A MEAL 12/06/23   Burns, Glade PARAS, MD  Multiple Vitamins-Minerals (CENTRUM PO) Take 1 tablet by mouth daily. Silver    [provider]  OVER THE COUNTER MEDICATION Take 3 capsules by mouth daily. Balance of [employer], Historical, MD  Polyethyl Glyc-Propyl Glyc PF (SYSTANE HYDRATION PF) 0.4-0.3 % SOLN Place 1 drop into both eyes daily as needed (Dry eyes).    [provider]  rosuvastatin  (CRESTOR ) 10 MG tablet TAKE 1 TABLET(10 MG) BY MOUTH AT BEDTIME 05/17/23   Jordan, Peter M, MD  sodium chloride  (OCEAN) 0.65 % SOLN nasal spray Place 1 spray into both nostrils at bedtime as needed for  congestion.    [provider]  venlafaxine  XR (EFFEXOR -XR) 37.5 MG 24 hr capsule TAKE 1 CAPSULE(37.5 MG) BY MOUTH DAILY WITH BREAKFAST 10/25/23   Geofm Glade PARAS, MD                                                                                                                                    Past Surgical History Past Surgical History:  Procedure Laterality Date   APPENDECTOMY  1943   ATRIAL FLUTTER ABLATION  12/04/2014   BACK SURGERY     CARDIAC CATHETERIZATION  02/10/2010   Est. EF of 60-65% -- Severe three-vessel obstructive atherosclerotic coronary artery disease -- All grafts were patent including left internal mammary artery graft to left anterior descending coronary artery, saphenous vein graft to the diagonal, saphenous vein graft to obtuse marginal vessel, and saphenous vein graft to the posterior descending coronary artery -- Normal left ventricular function    CATARACT EXTRACTION Bilateral 2023   COLONOSCOPY     negative X3; Dr Avram   CORONARY ARTERY BYPASS GRAFT  10/21/1993   x4 -- using the LIM artery graft to the LAD artery, with saphenous vein grafts to the diagonal branch of the LAD, OM branch the left circumflex coronary artery, and the posterior descending branch of the RCA -- Est. EF of 65%-- Surgeon: Dorise LOIS Fellers, M.D.               ELECTROPHYSIOLOGIC STUDY N/A 10/01/2014   CTI ablation by Dr Inocencio   ELECTROPHYSIOLOGIC STUDY N/A 12/04/2014   repeat CTI ablation by Dr Kelsie   FLEXIBLE SIGMOIDOSCOPY  01/31/2003    external hemorrhoids   INTRAOPERATIVE TRANSTHORACIC ECHOCARDIOGRAM N/A 07/17/2023   Procedure: ECHOCARDIOGRAM, TRANSTHORACIC;  Surgeon: Wendel Lurena POUR, MD;  Location: [facility] INVASIVE CV LAB;  Service: Cardiovascular;  Laterality: N/A;   JOINT REPLACEMENT     LUMBAR LAMINECTOMY/DECOMPRESSION MICRODISCECTOMY Bilateral 08/03/2021   Procedure: Lumbar two-three, Lumbar three-four, Lumbar four-five BILATERAL LAMINOTOMY/FORAMINOTOMY;  Surgeon: Mavis Purchase, MD;  Location: [facility] OR;  Service: Neurosurgery;  Laterality: Bilateral;   MICRODISCECTOMY LUMBAR  01/30/2002   L4-5, Dr. Mavis   PROSTATE BIOPSY  10/30/2008   had radiation tx for prostate cancer   RIGHT HEART CATH AND CORONARY/GRAFT ANGIOGRAPHY N/A 04/24/2023   Procedure: RIGHT HEART CATH AND CORONARY/GRAFT ANGIOGRAPHY;  Surgeon: Jordan, Peter M, MD;  Location: [facility] INVASIVE CV LAB;  Service: Cardiovascular;  Laterality: N/A;   SALIVARY STONE REMOVAL  01/31/1999   TOTAL HIP ARTHROPLASTY Right 11/30/2008   TOTAL HIP ARTHROPLASTY Left 03/02/2010   Dr Ernie   Family History Family History  Problem Relation Age of Onset   Diabetes Father    Hypertension Father    Lung cancer Father        smoker   Heart failure Mother 43   Hyperlipidemia Sister    Coronary artery disease Sister    Heart attack Paternal Aunt        X 2; both > 65   Colon cancer Neg Hx    Stroke Neg Hx     Social History Social History   Tobacco Use   Smoking status: Former    Current packs/day: 0.00    Average packs/day: 2.5 packs/day for 15.0 years (37.5 ttl pk-yrs)    Types: Cigarettes    Start date: 01/31/1955    Quit date: 01/30/1970    Years since quitting: 53.9   Smokeless tobacco: Never   Tobacco comments:    smoked 1959-1972  Vaping Use   Vaping status: Never Used  Substance Use Topics   Alcohol  use: Yes    Alcohol /week: 2.0 standard drinks of alcohol     Types: 1 Cans of beer, 1 Standard drinks or equivalent per week    Comment: occasionally   Drug use: No   Allergies Naproxen sodium  Review of Systems A thorough review of systems was obtained and all systems are negative except as noted in the HPI and PMH.   Physical Exam Vital Signs  I have reviewed the triage vital signs BP (!) 160/81   Pulse 88   Temp 97.8 F (36.6 C) (Oral)   Resp 17   Ht 5' 11 (1.803 m)   Wt 83.5 kg   SpO2 95%   BMI 25.66 kg/m  Physical Exam Vitals and nursing note reviewed.  Constitutional:       General: He is not in acute distress.    Appearance: Normal appearance. He is well-developed.  HENT:     Head: Normocephalic and atraumatic.     Right Ear: External ear normal.     Left Ear: External ear normal.     Mouth/Throat:     Mouth: Mucous membranes are moist.  Eyes:     General: No scleral icterus.    Extraocular Movements: Extraocular movements intact.     Pupils: Pupils are equal, round, and reactive to light.  Neck:     Comments: C-collar Cardiovascular:     Rate and Rhythm: Normal rate. Rhythm irregular.     Pulses: Normal pulses.     Heart sounds: Normal heart sounds.  Pulmonary:     Effort: Pulmonary effort is normal. No respiratory distress.     Breath sounds: Normal breath sounds.  Abdominal:     General: Abdomen is flat.     Palpations: Abdomen is soft.     Tenderness: There is no abdominal tenderness.  Musculoskeletal:     Cervical back: No spinous process tenderness.     Right lower leg: No edema.     Left lower leg: No edema.     Comments: Pelvis stable to AP pressure  Skin:  General: Skin is warm and dry.     Capillary Refill: Capillary refill takes less than 2 seconds.  Neurological:     Mental Status: He is alert and oriented to person, place, and time.     GCS: GCS eye subscore is 4. GCS verbal subscore is 5. GCS motor subscore is 6.     Cranial Nerves: Cranial nerves 2-12 are intact. No dysarthria.     Sensory: Sensation is intact. No sensory deficit.     Motor: Motor function is intact. No weakness.     Coordination: Coordination is intact.     Comments: Gait testing deferred secondary to patient safety. Strength 5/5 to BLUE/BLLE, equal and symmetric    Psychiatric:        Mood and Affect: Mood normal.        Behavior: Behavior normal.     ED Results and Treatments Labs (all labs ordered are listed, but only abnormal results are displayed) Labs Reviewed  COMPREHENSIVE METABOLIC PANEL WITH GFR - Abnormal; Notable for the following  components:      Result Value   Glucose, Bld 207 (*)    All other components within normal limits  URINALYSIS, ROUTINE W REFLEX MICROSCOPIC - Abnormal; Notable for the following components:   Color, Urine STRAW (*)    Glucose, UA >=500 (*)    All other components within normal limits  I-STAT CHEM 8, ED - Abnormal; Notable for the following components:   Glucose, Bld 202 (*)    All other components within normal limits  I-STAT CG4 LACTIC ACID, ED - Abnormal; Notable for the following components:   Lactic Acid, Venous 3.4 (*)    All other components within normal limits  CBC  ETHANOL  PROTIME-INR  SAMPLE TO BLOOD BANK                                                                                                                          Radiology CT HEAD WO CONTRAST Result Date: 01/04/2024 EXAM: CT HEAD AND CERVICAL SPINE 01/04/2024 04:44:41 PM TECHNIQUE: CT of the head and cervical spine was performed without the administration of intravenous contrast. Multiplanar reformatted images are provided for review. Automated exposure control, iterative reconstruction, and/or weight based adjustment of the mA/kV was utilized to reduce the radiation dose to as low as reasonably achievable. COMPARISON: CT of the chest Jun 07, 2023 . CLINICAL HISTORY: Head trauma, moderate-severe FINDINGS: CT HEAD BRAIN AND VENTRICLES: No acute intracranial hemorrhage. No mass effect or midline shift. No abnormal extra-axial fluid collection. No evidence of acute infarct. No hydrocephalus. Cerebral atrophy. ORBITS: No acute abnormality. SINUSES AND MASTOIDS: No acute abnormality. SOFT TISSUES AND SKULL: No acute skull fracture. No acute soft tissue abnormality. CT CERVICAL SPINE BONES AND ALIGNMENT: No acute fracture or traumatic malalignment. DEGENERATIVE CHANGES: Left greater than right facet and uncovertebral hypertrophy at multiple levels with resulting varying degrees of neural foraminal stenosis. SOFT TISSUES: No  prevertebral soft tissue swelling. IMPRESSION:  1. No acute intracranial abnormality. 2. No acute fracture or traumatic malalignment of the cervical spine. Electronically signed by: Gilmore Molt MD 01/04/2024 05:03 PM EST RP Workstation: [id number]   CT CERVICAL SPINE WO CONTRAST Result Date: 01/04/2024 EXAM: CT HEAD AND CERVICAL SPINE 01/04/2024 04:44:41 PM TECHNIQUE: CT of the head and cervical spine was performed without the administration of intravenous contrast. Multiplanar reformatted images are provided for review. Automated exposure control, iterative reconstruction, and/or weight based adjustment of the mA/kV was utilized to reduce the radiation dose to as low as reasonably achievable. COMPARISON: CT of the chest Jun 07, 2023 . CLINICAL HISTORY: Head trauma, moderate-severe FINDINGS: CT HEAD BRAIN AND VENTRICLES: No acute intracranial hemorrhage. No mass effect or midline shift. No abnormal extra-axial fluid collection. No evidence of acute infarct. No hydrocephalus. Cerebral atrophy. ORBITS: No acute abnormality. SINUSES AND MASTOIDS: No acute abnormality. SOFT TISSUES AND SKULL: No acute skull fracture. No acute soft tissue abnormality. CT CERVICAL SPINE BONES AND ALIGNMENT: No acute fracture or traumatic malalignment. DEGENERATIVE CHANGES: Left greater than right facet and uncovertebral hypertrophy at multiple levels with resulting varying degrees of neural foraminal stenosis. SOFT TISSUES: No prevertebral soft tissue swelling. IMPRESSION: 1. No acute intracranial abnormality. 2. No acute fracture or traumatic malalignment of the cervical spine. Electronically signed by: Gilmore Molt MD 01/04/2024 05:03 PM EST RP Workstation: [id number]   DG Pelvis Portable Result Date: 01/04/2024 CLINICAL DATA:  Trauma, status post fall. EXAM: PORTABLE PELVIS 1-2 VIEWS COMPARISON:  Pelvis radiograph 03/15/2010 FINDINGS: Bilateral hip arthroplasties are intact. No acute or periprosthetic fracture. No  periprosthetic lucency. Pubic rami are intact. Pubic symphysis and sacroiliac joints are congruent. Vascular calcifications IMPRESSION: No acute fracture or dislocation of the pelvis. Bilateral hip arthroplasties without complication. Electronically Signed   By: Andrea Gasman M.D.   On: 01/04/2024 16:15   DG Chest Port 1 View Result Date: 01/04/2024 CLINICAL DATA:  Trauma, status post fall. EXAM: PORTABLE CHEST 1 VIEW COMPARISON:  07/13/2023 FINDINGS: Prior median sternotomy. The heart is upper normal in size. TAVR.The cardiomediastinal contours are stable. The lungs are clear. Pulmonary vasculature is normal. No consolidation, pleural effusion, or pneumothorax. No acute osseous abnormalities are seen. Chronic bilateral shoulder arthropathy. IMPRESSION: No acute chest findings. Electronically Signed   By: Andrea Gasman M.D.   On: 01/04/2024 16:14    Pertinent labs & imaging results that were available during my care of the patient were reviewed by me and considered in my medical decision making (see MDM for details).  Medications Ordered in ED Medications - No data to display                                                                                                                                   Procedures Procedures  (including critical care time)  Medical Decision Making / ED Course    Medical Decision Making:    Bryce Maldonado is a 83  y.o. male with past medical history as below, significant for afib on eliquis , BPH, CAD, s/p cabg, s/p tavr who presents to the ED with complaint of FOT. The complaint involves an extensive differential diagnosis and also carries with it a high risk of complications and morbidity.  Serious etiology was considered. Ddx includes but is not limited to: Differential diagnoses for head trauma includes subdural hematoma, epidural hematoma, acute concussion, traumatic subarachnoid hemorrhage, cerebral contusions, etc.   Complete initial physical exam  performed, notably the patient was in no acute rest.    Reviewed and confirmed nursing documentation for past medical history, family history, social history.  Vital signs reviewed.    Ground-level fall with head injury, anticoagulated, level 2 trauma> -Primary survey was completed, airway intact, clear breath sounds bilateral, equal pulses to extremities, HDS, GCS 15, pupils 4 mm briskly reactive bilateral.  No obvious external injuries - No LOC, last dose of Eliquis  was this morning. - Labs stable - CT head and C-spine stable - Chest and pelvis x-ray stable - He is feeling better - He is ambulatory, he is tolerating p.o. Spouse to take him home.  - Provided concussion precautions, fall precautions      9:37 PM:  I have discussed the diagnosis/risks/treatment options with the patient.  Evaluation and diagnostic testing in the emergency department does not suggest an emergent condition requiring admission or immediate intervention beyond what has been performed at this time.  They will follow up with PCP. We also discussed returning to the ED immediately if new or worsening sx occur. We discussed the sx which are most concerning (e.g., sudden worsening pain, fever, inability to tolerate by mouth) that necessitate immediate return.    The patient appears reasonably screened and/or stabilized for discharge and I doubt any other medical condition or other [facility] requiring further screening, evaluation, or treatment in the ED at this time prior to discharge.                 Additional history obtained: -Additional history obtained from ems -External records from outside source obtained and reviewed including: Chart review including previous notes, labs, imaging, consultation notes including  Home medications, allergies   Lab Tests: -I ordered, reviewed, and interpreted labs.   The pertinent results include:   Labs Reviewed  COMPREHENSIVE METABOLIC PANEL WITH GFR - Abnormal;  Notable for the following components:      Result Value   Glucose, Bld 207 (*)    All other components within normal limits  URINALYSIS, ROUTINE W REFLEX MICROSCOPIC - Abnormal; Notable for the following components:   Color, Urine STRAW (*)    Glucose, UA >=500 (*)    All other components within normal limits  I-STAT CHEM 8, ED - Abnormal; Notable for the following components:   Glucose, Bld 202 (*)    All other components within normal limits  I-STAT CG4 LACTIC ACID, ED - Abnormal; Notable for the following components:   Lactic Acid, Venous 3.4 (*)    All other components within normal limits  CBC  ETHANOL  PROTIME-INR  SAMPLE TO BLOOD BANK    Notable for initially elevated lactic acid   EKG   EKG Interpretation Date/Time:  Friday January 04 2024 16:15:26 EST Ventricular Rate:  71 PR Interval:    QRS Duration:  141 QT Interval:  435 QTC Calculation: 473 R Axis:   80  Text Interpretation: Atrial fibrillation Left bundle branch block Confirmed by Elnor Savant ([id number]) on 01/04/2024 4:17:48 PM  Imaging Studies ordered: I ordered imaging studies including CT head and C-spine, chest and pelvis x-ray I independently visualized the following imaging with scope of interpretation limited to determining acute life threatening conditions related to emergency care; findings noted above I agree with the radiologist interpretation If any imaging was obtained with contrast I closely monitored patient for any possible adverse reaction a/w contrast administration in the emergency department   Medicines ordered and prescription drug management: No orders of the defined types were placed in this encounter.   -I have reviewed the patients home medicines and have made adjustments as needed   Consultations Obtained: Not applicable  Cardiac Monitoring: Continuous pulse oximetry interpreted by myself, 98% on room air.    Social Determinants of Health:  Diagnosis or treatment  significantly limited by social determinants of health: former smoker   Reevaluation: After the interventions noted above, I reevaluated the patient and found that they have improved  Co morbidities that complicate the patient evaluation  Past Medical History:  Diagnosis Date   Anemia    Atrial fibrillation (HCC)    pt has had an ablation   BPH (benign prostatic hyperplasia)    Elevated PSA   Coronary artery disease    post CABG x4 -- in 1995 -- due to three-vessel coronary artery disease   Diverticular disease    Dyslipidemia    First degree AV block    Gout    Hypertension    Osteoarthritis    everywhere (12/04/2014)   Prostate cancer (HCC) 01/30/2010   Dr Ceil   S/P CABG (coronary artery bypass graft)    S/P TAVR (transcatheter aortic valve replacement) 07/17/2023   s/p TAVR with a 26 mm Edwards S3UR via the TF approach by Dr. Wendel and Dr. Lucas   Severe aortic stenosis    Spinal stenosis 01/30/2002   L4 radiculopathy, herniated nucleus pulposus L4-5   Tinnitus    Type II diabetes mellitus (HCC)    Typical atrial flutter (HCC)    s/p ablation      Dispostion: Disposition decision including need for hospitalization was considered, and patient discharged from emergency department.    Final Clinical Impression(s) / ED Diagnoses Final diagnoses:  Fall, initial encounter  Minor head injury, initial encounter        Elnor Jayson LABOR, DO 01/04/24 2137

## 2024-01-04 NOTE — Discharge Instructions (Addendum)
 It was a pleasure caring for you today in the emergency department.  Based on the events which brought you to the ER today, it is possible that you may have a concussion. A concussion occurs when there is a blow to the head or body, with enough force to shake the brain and disrupt how the brain functions. You may experience symptoms such as headaches, sensitivity to light/noise, dizziness, cognitive slowing, difficulty concentrating / remembering, trouble sleeping and drowsiness. These symptoms may last anywhere from hours/days to potentially weeks/months. While these symptoms are very frustrating and perhaps debilitating, it is important that you remember that they will improve over time. Everyone has a different rate of recovery; it is difficult to predict when your symptoms will resolve. In order to allow for your brain to heal after the injury, we recommend that you see your primary physician or a physician knowledgeable in concussion management. We also advise you to let your body and brain rest: avoid physical activities (sports, gym, and exercise) and reduce cognitive demands (reading, texting, TV watching, computer use, video games, etc). School attendance, after-school activities and work may need to be modified to avoid increasing symptoms. We recommend against driving until until all symptoms have resolved. Come back to the ER right away if you are having repeated episodes of vomiting, severe/worsening headache/dizziness or any other symptom that alarms you. We recommended that someone stay with you for the next 24 hours to monitor for these worrisome symptoms.  Recommend you use a cane or a walker at home, an order was placed for a cane that should be delivered to your house.   Please return to the emergency department for any worsening or worrisome symptoms.

## 2024-01-07 ENCOUNTER — Ambulatory Visit

## 2024-01-15 ENCOUNTER — Other Ambulatory Visit: Payer: Self-pay | Admitting: Cardiology

## 2024-01-15 NOTE — Telephone Encounter (Signed)
 Prescription refill request for Eliquis  received. Indication:afib Last office visit:8/25 Scr: 0.80  12/25 Age:83 Weight:83.5  kg  Prescription refilled

## 2024-03-04 ENCOUNTER — Ambulatory Visit

## 2024-03-12 ENCOUNTER — Encounter: Admitting: Internal Medicine
